# Patient Record
Sex: Male | Born: 1945 | Race: White | Hispanic: No | Marital: Married | Smoking: Former smoker
Health system: Southern US, Community
[De-identification: ages and names within clinical notes are randomized; demographics above are authoritative.]

## PROBLEM LIST (undated history)

## (undated) DIAGNOSIS — M1990 Unspecified osteoarthritis, unspecified site: Secondary | ICD-10-CM

## (undated) DIAGNOSIS — K219 Gastro-esophageal reflux disease without esophagitis: Secondary | ICD-10-CM

## (undated) DIAGNOSIS — Z86010 Personal history of colonic polyps: Secondary | ICD-10-CM

## (undated) DIAGNOSIS — Z8711 Personal history of peptic ulcer disease: Secondary | ICD-10-CM

## (undated) DIAGNOSIS — E785 Hyperlipidemia, unspecified: Secondary | ICD-10-CM

## (undated) DIAGNOSIS — I10 Essential (primary) hypertension: Secondary | ICD-10-CM

## (undated) DIAGNOSIS — Z85828 Personal history of other malignant neoplasm of skin: Secondary | ICD-10-CM

## (undated) DIAGNOSIS — C801 Malignant (primary) neoplasm, unspecified: Secondary | ICD-10-CM

## (undated) DIAGNOSIS — IMO0002 Reserved for concepts with insufficient information to code with codable children: Secondary | ICD-10-CM

## (undated) HISTORY — DX: Gastro-esophageal reflux disease without esophagitis: K21.9

## (undated) HISTORY — PX: OTHER SURGICAL HISTORY: SHX169

## (undated) HISTORY — PX: EYE SURGERY: SHX253

## (undated) HISTORY — DX: Hyperlipidemia, unspecified: E78.5

## (undated) HISTORY — DX: Personal history of peptic ulcer disease: Z87.11

## (undated) HISTORY — DX: Reserved for concepts with insufficient information to code with codable children: IMO0002

## (undated) HISTORY — DX: Personal history of other malignant neoplasm of skin: Z85.828

## (undated) HISTORY — DX: Personal history of colonic polyps: Z86.010

---

## 1968-03-14 HISTORY — PX: INGUINAL HERNIA REPAIR: SUR1180

## 2006-03-27 ENCOUNTER — Ambulatory Visit: Payer: Self-pay | Admitting: Internal Medicine

## 2006-03-29 ENCOUNTER — Ambulatory Visit: Payer: Self-pay | Admitting: Internal Medicine

## 2006-03-29 LAB — CONVERTED CEMR LAB
AST: 14 units/L (ref 0–37)
BUN: 8 mg/dL (ref 6–23)
Calcium: 10.3 mg/dL (ref 8.4–10.5)
Chloride: 104 meq/L (ref 96–112)
Cholesterol: 206 mg/dL (ref 0–200)
Creatinine, Ser: 1 mg/dL (ref 0.4–1.5)
Eosinophils Relative: 1.6 % (ref 0.0–5.0)
HCT: 52 % (ref 39.0–52.0)
HDL: 23 mg/dL — ABNORMAL LOW (ref >39.0)
Hemoglobin: 16.8 g/dL (ref 13.0–17.0)
Lymphocytes Relative: 23.2 % (ref 12.0–46.0)
MCHC: 32.4 g/dL (ref 30.0–36.0)
MCV: 94.5 fL (ref 78.0–100.0)
Neutro Abs: 6.9 10*3/uL (ref 1.4–7.7)
Neutrophils Relative %: 67 % (ref 43.0–77.0)
PSA: 0.5 ng/mL (ref 0.10–4.00)
RDW: 12.4 % (ref 11.5–14.6)
Total Bilirubin: 1 mg/dL (ref 0.3–1.2)

## 2006-04-05 ENCOUNTER — Encounter: Admission: RE | Admit: 2006-04-05 | Discharge: 2006-04-05 | Payer: Self-pay | Admitting: Internal Medicine

## 2006-04-19 ENCOUNTER — Ambulatory Visit: Payer: Self-pay | Admitting: Cardiology

## 2006-12-28 ENCOUNTER — Ambulatory Visit: Payer: Self-pay | Admitting: Internal Medicine

## 2006-12-28 ENCOUNTER — Inpatient Hospital Stay (HOSPITAL_COMMUNITY): Admission: EM | Admit: 2006-12-28 | Discharge: 2006-12-30 | Payer: Self-pay | Admitting: Emergency Medicine

## 2006-12-29 ENCOUNTER — Encounter (INDEPENDENT_AMBULATORY_CARE_PROVIDER_SITE_OTHER): Payer: Self-pay | Admitting: Gastroenterology

## 2006-12-29 ENCOUNTER — Encounter: Payer: Self-pay | Admitting: Internal Medicine

## 2007-01-18 ENCOUNTER — Ambulatory Visit: Payer: Self-pay | Admitting: Internal Medicine

## 2007-01-18 DIAGNOSIS — R930 Abnormal findings on diagnostic imaging of skull and head, not elsewhere classified: Secondary | ICD-10-CM | POA: Insufficient documentation

## 2007-01-18 DIAGNOSIS — D649 Anemia, unspecified: Secondary | ICD-10-CM

## 2007-01-19 LAB — CONVERTED CEMR LAB
Basophils Absolute: 0.1 10*3/uL (ref 0.0–0.1)
Basophils Relative: 0.6 % (ref 0.0–1.0)
HCT: 36.3 % — ABNORMAL LOW (ref 39.0–52.0)
Hemoglobin: 12.6 g/dL — ABNORMAL LOW (ref 13.0–17.0)
MCHC: 34.7 g/dL (ref 30.0–36.0)
Monocytes Absolute: 0.3 10*3/uL (ref 0.2–0.7)
Neutrophils Relative %: 68.9 % (ref 43.0–77.0)
RDW: 13.5 % (ref 11.5–14.6)

## 2007-01-22 ENCOUNTER — Encounter (INDEPENDENT_AMBULATORY_CARE_PROVIDER_SITE_OTHER): Payer: Self-pay | Admitting: *Deleted

## 2007-01-24 ENCOUNTER — Ambulatory Visit: Payer: Self-pay | Admitting: Internal Medicine

## 2007-01-26 ENCOUNTER — Encounter (INDEPENDENT_AMBULATORY_CARE_PROVIDER_SITE_OTHER): Payer: Self-pay | Admitting: *Deleted

## 2007-01-26 ENCOUNTER — Encounter: Payer: Self-pay | Admitting: Internal Medicine

## 2007-02-28 ENCOUNTER — Encounter: Payer: Self-pay | Admitting: Internal Medicine

## 2007-03-05 ENCOUNTER — Telehealth (INDEPENDENT_AMBULATORY_CARE_PROVIDER_SITE_OTHER): Payer: Self-pay | Admitting: *Deleted

## 2007-03-14 ENCOUNTER — Ambulatory Visit (HOSPITAL_COMMUNITY): Admission: RE | Admit: 2007-03-14 | Discharge: 2007-03-14 | Payer: Self-pay | Admitting: Gastroenterology

## 2007-03-14 ENCOUNTER — Encounter (INDEPENDENT_AMBULATORY_CARE_PROVIDER_SITE_OTHER): Payer: Self-pay | Admitting: Gastroenterology

## 2007-03-14 ENCOUNTER — Encounter: Payer: Self-pay | Admitting: Internal Medicine

## 2007-09-13 ENCOUNTER — Ambulatory Visit: Payer: Self-pay | Admitting: Internal Medicine

## 2007-09-13 DIAGNOSIS — Z8711 Personal history of peptic ulcer disease: Secondary | ICD-10-CM

## 2007-09-13 DIAGNOSIS — Z860100 Personal history of colon polyps, unspecified: Secondary | ICD-10-CM | POA: Insufficient documentation

## 2007-09-13 DIAGNOSIS — E785 Hyperlipidemia, unspecified: Secondary | ICD-10-CM

## 2007-09-13 HISTORY — DX: Personal history of peptic ulcer disease: Z87.11

## 2007-09-13 HISTORY — DX: Personal history of colon polyps, unspecified: Z86.0100

## 2007-09-13 HISTORY — DX: Personal history of colonic polyps: Z86.010

## 2007-09-17 ENCOUNTER — Encounter (INDEPENDENT_AMBULATORY_CARE_PROVIDER_SITE_OTHER): Payer: Self-pay | Admitting: *Deleted

## 2007-09-17 LAB — CONVERTED CEMR LAB
BUN: 8 mg/dL (ref 6–23)
Bilirubin, Direct: 0.1 mg/dL (ref 0.0–0.3)
Eosinophils Absolute: 0.3 10*3/uL (ref 0.0–0.7)
HCT: 46.7 % (ref 39.0–52.0)
MCHC: 34.6 g/dL (ref 30.0–36.0)
MCV: 91.1 fL (ref 78.0–100.0)
Monocytes Absolute: 0.5 10*3/uL (ref 0.1–1.0)
PSA: 0.53 ng/mL (ref 0.10–4.00)
Platelets: 243 10*3/uL (ref 150–400)
Potassium: 4.4 meq/L (ref 3.5–5.1)
RDW: 12.3 % (ref 11.5–14.6)
Total Bilirubin: 0.8 mg/dL (ref 0.3–1.2)

## 2007-09-18 ENCOUNTER — Encounter: Payer: Self-pay | Admitting: Internal Medicine

## 2007-10-12 ENCOUNTER — Telehealth (INDEPENDENT_AMBULATORY_CARE_PROVIDER_SITE_OTHER): Payer: Self-pay | Admitting: *Deleted

## 2007-10-29 ENCOUNTER — Telehealth (INDEPENDENT_AMBULATORY_CARE_PROVIDER_SITE_OTHER): Payer: Self-pay | Admitting: *Deleted

## 2007-12-18 ENCOUNTER — Ambulatory Visit: Payer: Self-pay | Admitting: Internal Medicine

## 2007-12-25 ENCOUNTER — Encounter (INDEPENDENT_AMBULATORY_CARE_PROVIDER_SITE_OTHER): Payer: Self-pay | Admitting: *Deleted

## 2007-12-25 ENCOUNTER — Ambulatory Visit: Payer: Self-pay | Admitting: Internal Medicine

## 2007-12-25 LAB — CONVERTED CEMR LAB
ALT: 11 units/L (ref 0–53)
Alkaline Phosphatase: 81 units/L (ref 39–117)
Bilirubin, Direct: 0.1 mg/dL (ref 0.0–0.3)
Cholesterol: 141 mg/dL (ref 0–200)
LDL Cholesterol: 94 mg/dL (ref 0–99)
Total Bilirubin: 0.6 mg/dL (ref 0.3–1.2)
Total Protein: 7.3 g/dL (ref 6.0–8.3)

## 2009-09-01 ENCOUNTER — Ambulatory Visit: Payer: Self-pay | Admitting: Internal Medicine

## 2009-09-01 DIAGNOSIS — R7309 Other abnormal glucose: Secondary | ICD-10-CM

## 2009-09-01 DIAGNOSIS — I4510 Unspecified right bundle-branch block: Secondary | ICD-10-CM | POA: Insufficient documentation

## 2009-09-01 DIAGNOSIS — Z85828 Personal history of other malignant neoplasm of skin: Secondary | ICD-10-CM | POA: Insufficient documentation

## 2009-09-01 HISTORY — DX: Personal history of other malignant neoplasm of skin: Z85.828

## 2009-09-01 LAB — CONVERTED CEMR LAB: HDL goal, serum: 40 mg/dL

## 2009-09-07 LAB — CONVERTED CEMR LAB
ALT: 14 units/L (ref 0–53)
AST: 16 units/L (ref 0–37)
Alkaline Phosphatase: 78 units/L (ref 39–117)
BUN: 13 mg/dL (ref 6–23)
Basophils Absolute: 0.1 10*3/uL (ref 0.0–0.1)
Bilirubin, Direct: 0.1 mg/dL (ref 0.0–0.3)
Calcium: 10.5 mg/dL (ref 8.4–10.5)
Eosinophils Relative: 2.1 % (ref 0.0–5.0)
GFR calc non Af Amer: 84.45 mL/min (ref >60)
HCT: 43.8 % (ref 39.0–52.0)
HDL: 27.7 mg/dL — ABNORMAL LOW (ref >39.00)
Lymphocytes Relative: 23.2 % (ref 12.0–46.0)
Lymphs Abs: 2.5 10*3/uL (ref 0.7–4.0)
Monocytes Relative: 5.2 % (ref 3.0–12.0)
Neutrophils Relative %: 68.8 % (ref 43.0–77.0)
Platelets: 280 10*3/uL (ref 150.0–400.0)
Potassium: 4.6 meq/L (ref 3.5–5.1)
RDW: 12.6 % (ref 11.5–14.6)
Total Bilirubin: 0.7 mg/dL (ref 0.3–1.2)
Triglycerides: 277 mg/dL — ABNORMAL HIGH (ref 0.0–149.0)
VLDL: 55.4 mg/dL — ABNORMAL HIGH (ref 0.0–40.0)
WBC: 10.7 10*3/uL — ABNORMAL HIGH (ref 4.5–10.5)

## 2009-12-29 ENCOUNTER — Ambulatory Visit: Payer: Self-pay | Admitting: Internal Medicine

## 2009-12-30 LAB — CONVERTED CEMR LAB
Cholesterol: 193 mg/dL (ref 0–200)
HDL: 27 mg/dL — ABNORMAL LOW (ref >39.00)
VLDL: 35.8 mg/dL (ref 0.0–40.0)

## 2010-04-13 NOTE — Assessment & Plan Note (Signed)
Summary: cpx//lch   Vital Signs:  Patient profile:   65 year old male Height:      72 inches Weight:      200 pounds BMI:     27.22 BSA:     2.13 Pulse rate:   72 / minute Pulse rhythm:   regular Resp:     15 per minute BP sitting:   136 / 84  (left arm) Cuff size:   large  Vitals Entered By: Ok Anis CMA (September 01, 2009 11:11 AM)   CC:  CPX .  History of Present Illness: Mr. Flater is here for a physical; he is asymptomatic .   Lipid Management History:      Positive NCEP/ATP III risk factors include male age 63 years old or older and HDL cholesterol less than 40.  Negative NCEP/ATP III risk factors include non-diabetic, no family history for ischemic heart disease, non-tobacco-user status, non-hypertensive, no ASHD (atherosclerotic heart disease), no prior stroke/TIA, no peripheral vascular disease, and no history of aortic aneurysm.     Preventive Screening-Counseling & Management  Alcohol-Tobacco     Smoking Status: quit  Caffeine-Diet-Exercise     Does Patient Exercise: no  Current Medications (verified): 1)  Omeprazole 20 Mg  Tbec (Omeprazole) .Marland Kitchen.. 1 By Mouth Bid 2)  Multivitamins  Tabs (Multiple Vitamin) .... One Tablet By Mouth Once Daily  Allergies (verified): 1)  ! Trilipix (Choline Fenofibrate)  Past History:  Past Medical History: DUD, bleeding  1992 & 2008 Colonic polyps, 2008 Hyperlipidemia: Framingham Study LDL goal = < 130 Hyperglycemia Skin cancer, hx of 2010, Dr Karlyn Agee  Past Surgical History: ABNORMAL CHEST XRAY (ICD-793.1) ANEMIA (ICD-285.9), PMH of Cauterization in 1992,resolution with meds  2008 Eye surgery post trauma, glass fragments removed & lacerations repaired  Colonoscopy 02/2007 :   polypectomy (multiple), Dr Vida Rigger Inguinal herniorrhaphy Mohs surgery R ear 2011; Pilonidal Cystectomy 1969  Family History: Father: 76 of old age @ 69 Mother: DECEASED, CVA,CAD,pacer,HTN,CHF Siblings: 1 BROTHER:DM,HTN  Social  History: No diet Occupation: Unemployed Married Former Smoker: 08/2008 Alcohol use-no Regular exercise-no Smoking Status:  quit Does Patient Exercise:  no  Review of Systems  The patient denies anorexia, fever, weight loss, weight gain, decreased hearing, hoarseness, chest pain, syncope, dyspnea on exertion, peripheral edema, prolonged cough, headaches, hemoptysis, abdominal pain, melena, hematochezia, severe indigestion/heartburn, hematuria, suspicious skin lesions, depression, unusual weight change, abnormal bleeding, enlarged lymph nodes, and angioedema.         Dr Ewing Schlein recommended colonoscopy last year; job loss caused him to defer it.  Physical Exam  General:  well-nourished,in no acute distress; alert,appropriate and cooperative throughout examination Head:  Normocephalic and atraumatic without obvious abnormalities. No apparent alopecia  Eyes:  No corneal or conjunctival inflammation noted. Ditorted OD pupil(prior trauma) Funduscopic exam benign, without hemorrhages, exudates or papilledema.  Ears:  External ear exam shows no significant lesions or deformities.  Otoscopic examination reveals clear canals, tympanic membranes are intact bilaterally without bulging, retraction, inflammation or discharge. Hearing is grossly normal bilaterally. Nose:  External nasal examination shows no deformity or inflammation. Nasal mucosa are pink and moist without lesions or exudates. Mouth:  Oral mucosa and oropharynx without lesions or exudates.  Crown missing L maxillary molar Neck:  No deformities, masses, or tenderness noted. Lungs:  Normal respiratory effort, chest expands symmetrically. Lungs are clear to auscultation, no crackles or wheezes. BS decreased  Heart:  Normal rate and regular rhythm. S1 and S2 normal without gallop, murmur, click,  rub . S4 Abdomen:  Bowel sounds positive,abdomen soft and non-tender without masses, organomegaly or hernias noted. Rectal:  No external abnormalities  noted. Normal sphincter tone. No rectal masses or tenderness. Op scar supragluteal area Genitalia:  Testes bilaterally descended without nodularity, tenderness or masses. No scrotal masses or lesions. No penis lesions or urethral discharge. Prostate:  Prostate gland firm and smooth, no enlargement, nodularity, tenderness, mass, asymmetry or induration. Msk:  No deformity or scoliosis noted of thoracic or lumbar spine.   Pulses:  R and L carotid,radial,dorsalis pedis and posterior tibial pulses are full and equal bilaterally Extremities:  No clubbing, cyanosis, edema, or deformity noted with normal full range of motion of all joints.   Neurologic:  alert & oriented X3, gait normal, and DTRs symmetrical and normal.   Skin:  Intact without suspicious lesions or rashes Cervical Nodes:  No lymphadenopathy noted Axillary Nodes:  No palpable lymphadenopathy Inguinal Nodes:  No significant adenopathy Psych:  memory intact for recent and remote, normally interactive, good eye contact, not anxious appearing, and not depressed appearing.     Impression & Recommendations:  Problem # 1:  ROUTINE GENERAL MEDICAL EXAM@HEALTH  CARE FACL (ICD-V70.0)  Orders: EKG w/ Interpretation (93000) Venipuncture ([address]) TLB-Lipid Panel (80061-LIPID) TLB-BMP (Basic Metabolic Panel-BMET) (80048-METABOL) TLB-CBC Platelet - w/Differential (85025-CBCD) TLB-Hepatic/Liver Function Pnl (80076-HEPATIC) TLB-TSH (Thyroid Stimulating Hormone) (84443-TSH) TLB-PSA (Prostate Specific Antigen) (84153-PSA) T-2 View CXR (71020TC)  Problem # 2:  HYPERLIPIDEMIA (ICD-272.4)  Orders: EKG w/ Interpretation (93000) Venipuncture (60454) TLB-Lipid Panel (80061-LIPID)  Problem # 3:  COLONIC POLYPS, HX OF (ICD-V12.72) as per Dr Ewing Schlein  Problem # 4:  DUODENAL ULCER, HX OF (ICD-V12.71)  His updated medication list for this problem includes:    Omeprazole 20 Mg Tbec (Omeprazole) .Marland Kitchen... 1 once daily  Problem # 5:  SKIN CANCER, HX OF  (ICD-V10.83) as per Dr Yetta Barre  Problem # 6:  ABNORMAL CHEST XRAY (ICD-793.1)  PMH of  Orders: T-2 View CXR (71020TC)  Complete Medication List: 1)  Omeprazole 20 Mg Tbec (Omeprazole) .Marland Kitchen.. 1 once daily 2)  Multivitamins Tabs (Multiple vitamin) .... One tablet by mouth once daily  Other Orders: Tdap => 55yrs IM (09811) Admin 1st Vaccine (91478)  Lipid Assessment/Plan:      Based on NCEP/ATP III, the patient's risk factor category is "2 or more risk factors and a calculated 10 year CAD risk of < 20%".  The patient's lipid goals are as follows: Total cholesterol goal is 200; LDL cholesterol goal is 130; HDL cholesterol goal is 40; Triglyceride goal is 150.  His LDL cholesterol goal has been met.    Patient Instructions: 1)  Avoid foods high in acid (tomatoes, citrus juices, spicy foods). Avoid eating within two hours of lying down or before exercising. Do not over eat; try smaller more frequent meals. Elevate head of bed twelve inches when sleeping.It is important that you exercise regularly at least 20 minutes 5 times a week. If you develop chest pain, have severe difficulty breathing, or feel very tired , stop exercising immediately and seek medical attention. Prescriptions: OMEPRAZOLE 20 MG  TBEC (OMEPRAZOLE) 1 once daily  #90 x 3   Entered and Authorized by:   Marga Melnick MD   Signed by:   Marga Melnick MD on 09/01/2009   Method used:   Faxed to ...       [facility] [phone]* (retail)       [address]       [place], [place]  [address]       Ph: [phone] or [phone]       Fax: [phone]   RxID:   [id number]    Immunizations Administered:  Tetanus Vaccine:    Vaccine Type: Tdap    Site: right deltoid    Mfr: GlaxoSmithKline    Dose: 0.5 ml    Route: IM    Given by: Ok Anis CMA    Exp. Date: 06/06/2011    Lot #: [id number]    VIS given: 01/30/07 version given September 01, 2009.    Orders Added: 1)  Tdap => 78yrs IM [90715] 2)  Admin 1st Vaccine  [90471] 3)  Est. Patient 40-64 years [99396] 4)  EKG w/ Interpretation [93000] 5)  Venipuncture [36415] 6)  TLB-Lipid Panel [80061-LIPID] 7)  TLB-BMP (Basic Metabolic Panel-BMET) [80048-METABOL] 8)  TLB-CBC Platelet - w/Differential [85025-CBCD] 9)  TLB-Hepatic/Liver Function Pnl [80076-HEPATIC] 10)  TLB-TSH (Thyroid Stimulating Hormone) [84443-TSH] 11)  TLB-PSA (Prostate Specific Antigen) [34742-VZD] 12)  T-2 View CXR [71020TC]

## 2010-04-16 ENCOUNTER — Encounter: Payer: Self-pay | Admitting: Internal Medicine

## 2010-04-16 ENCOUNTER — Ambulatory Visit (INDEPENDENT_AMBULATORY_CARE_PROVIDER_SITE_OTHER): Payer: BC Managed Care – PPO | Admitting: Internal Medicine

## 2010-04-16 DIAGNOSIS — K119 Disease of salivary gland, unspecified: Secondary | ICD-10-CM

## 2010-04-21 NOTE — Assessment & Plan Note (Signed)
Summary: knot by ear/cbs   Vital Signs:  Patient profile:   65 year old male Weight:      183.8 pounds BMI:     25.02 Temp:     97.8 degrees F oral Pulse rate:   72 / minute Resp:     14 per minute BP sitting:   130 / 86  (left arm) Cuff size:   large  Vitals Entered By: Shonna Chock CMA (April 16, 2010 4:28 PM) CC: Knot on the left side of neck, no pain (1st occurance), URI symptoms   CC:  Knot on the left side of neck, no pain (1st occurance), and URI symptoms.  History of Present Illness:   Painless "knot"  @ L mandibular angle today after eating chocolate. The patient denies nasal congestion, purulent nasal discharge, sore throat, productive cough, and earache.  The patient denies fever, dyspnea, and wheezing.  The patient denies headache.  The patient denies the following risk factors for Strep sinusitis: unilateral facial pain, tooth pain, and tender adenopathy.    Current Medications (verified): 1)  Omeprazole 20 Mg  Tbec (Omeprazole) .Marland Kitchen.. 1 Once Daily 2)  Multivitamins  Tabs (Multiple Vitamin) .... One Tablet By Mouth Once Daily  Allergies: 1)  ! Trilipix (Choline Fenofibrate)  Physical Exam  General:  well-nourished,in no acute distress; alert,appropriate and cooperative throughout examination Ears:  External ear exam shows no significant lesions or deformities.  Otoscopic examination reveals clear canals, tympanic membranes are intact bilaterally without bulging, retraction, inflammation or discharge. Hearing is grossly normal bilaterally. Nose:  External nasal examination shows no deformity or inflammation. Nasal mucosa are pink and moist without lesions or exudates. Mouth:  Oral mucosa and oropharynx without lesions or exudates.  Teeth in good repair. Mild swelling L parotid gland , non tender Abdomen:  Bowel sounds positive,abdomen soft and non-tender without masses, organomegaly or hernias noted. Skin:  Intact without suspicious lesions or rashes Cervical Nodes:   No lymphadenopathy noted Axillary Nodes:  No palpable lymphadenopathy   Impression & Recommendations:  Problem # 1:  UNSPECIFIED DISEASE OF THE SALIVARY GLANDS (ICD-527.9) L Parotid gland  swelling, resolving  Complete Medication List: 1)  Omeprazole 20 Mg Tbec (Omeprazole) .Marland Kitchen.. 1 once daily 2)  Multivitamins Tabs (Multiple vitamin) .... One tablet by mouth once daily  Patient Instructions: 1)  Warm  compresses 3-4X  / day & soft diet . Avoid sour candy   or other  food triggers as discussed.  ENT referral for persistent swelling or recurrence   Orders Added: 1)  Est. Patient Level III [[address]]

## 2010-07-27 NOTE — Op Note (Signed)
[name], [name]            ACCOUNT NO.:  [id number]   MEDICAL RECORD NO.:  [id number]          PATIENT TYPE:  INP   LOCATION:  3738                         FACILITY:  [facility]   PHYSICIAN:  Petra Kuba, M.D.    DATE OF BIRTH:  1945-09-17   DATE OF PROCEDURE:  12/29/2006  DATE OF DISCHARGE:                               OPERATIVE REPORT   PROCEDURE:  EGD with biopsy.   INDICATION:  A patient with hematemesis, melena, history of ulcers in  the past.   CONSENT:  Was signed after risks, benefits, methods, options thoroughly  discussed prior to any sedation.   MEDICINES USED:  Fentanyl 50 mcg, Versed 6 mg.   PROCEDURE IN DETAIL:  The video endoscope was inserted by direct vision.  He did have a small hiatal hernia with acute GE junction erosion.  The  scope passed into the stomach.  No blood was seen.  Advanced through a  normal antrum and pylorus into the duodenal bulb which was deformed  right at the C-loop were edematous folds and a moderate-sized ulcer with  a flat white base.  The ulcer was washed and watched but could not be  made to bleed.  We went ahead and advanced into the second and probably  third part of the duodenum.  No blood was seen distally.  He possibly  had some scalloping of the duodenal mucosa and a few biopsies were  obtained and put in the first container.  Scope was withdrawn back to  the ulcer.  Again it was rewashed and watched and it could not be made  to bleed.  Quick look again at the bulb confirmed its deformity.  No  other lesions were seen.  Scope was withdrawn back to the stomach and  retroflexed.  Cardia, fundus, angularis, lesser and greater curve were  evaluated on retroflex and then straight visualization without  additional findings.  We went ahead and took two biopsies of the antrum  and two of the proximal stomach to rule out Helicobacter and put them in  a separate container.  Air was suctioned, scope slowly withdrawn.  Again  a good  look at the GE junction confirmed the two small erosion, small  hiatal hernia but ruled out any additional lesions.  The rest of the  esophagus was normal.  Scope was removed.  The patient tolerated the  procedure well.  There was no obvious immediate complication.   ENDOSCOPIC DIAGNOSES:  1. Small hiatal hernia with two GE junction erosions.  2. Distal bulb beginning in the C-loop ulcer without visible vessel      signs of bleeding, clots, etc., could not be made to bleed with      washing.  3. Questionable scalloping in the third portion of the duodenum status      post biopsy.  4. Otherwise normal EGD without any blood being seen, status post      biopsy of the stomach to rule out Helicobacter.   PLAN:  B.i.d. pump inhibitors for 2 weeks and then decrease them to once  a day.  I would be happy  to see back in a month or two to recheck CBC,  treat Helicobacter if positive and then probably schedule him for a  screening  colonoscopy which he is due for.  In the meantime can advance diet and  hopefully go home soon if no rebleeding and no aspirin or nonsteroidals  long-term.  Will ask him to call me for the biopsy results in 1 week and  set up colonoscopy at that juncture and sooner p.r.n.           ______________________________  Petra Kuba, M.D.     MEM/MEDQ  D:  12/29/2006  T:  12/31/2006  Job:  [id number]   cc:   Vikki Ports A. Felicity Coyer, MD  Titus Dubin. Alwyn Ren, MD,FACP,FCCP  Tasia Catchings, M.D.

## 2010-07-27 NOTE — Consult Note (Signed)
NAMESTACE, PEACE NO.:  [id number]   MEDICAL RECORD NO.:  [id number]          PATIENT TYPE:  INP   LOCATION:  3738                         FACILITY:  [facility]   PHYSICIAN:  Petra Kuba, M.D.    DATE OF BIRTH:  Aug 31, 1945   DATE OF CONSULTATION:  DATE OF DISCHARGE:                                 CONSULTATION   REASON FOR CONSULTATION:  We were asked to see Mr. Mcclaine today by Dr.  Rene Paci of [facility] in consultation for  hematemesis.   HISTORY OF PRESENT ILLNESS:  This is a 65 year old male with a history  of ulcer disease in the late 90s complaining of nausea, but no other  upper tract GI symptoms until October 16 when he started experiencing  melena and dark hematemesis.  He came to the emergency room.  His  hemoglobin in the emergency room was 10.5.  By the time he reached the  fifth floor, his hemoglobin had dropped to 7.8.  He denies any alcohol  use.  He is a smoker, takes an occasional NSAID, but does not sound like  he uses them excessively.  He is not on any PPIs at home.  He takes no  medications.  He reports that he last vomited yesterday evening.  He has  had one bloody black bowel movement this morning.   PAST MEDICAL HISTORY:  Significant for only ulcer disease in  approximately 1998.  He denies any hypertension, diabetes or other  negative Health history.  He reports no surgeries.  He has never had a  colonoscopy.  His gastroenterologist is Dr. Sherin Quarry.  His primary care  physician is Dr. Alwyn Ren of .   REVIEW OF SYSTEMS:  Noncontributory.   SOCIAL HISTORY:  Positive for tobacco use, negative for alcohol and  drugs.  He is a Teacher, English as a foreign language.   FAMILY HISTORY:  Negative for ulcer disease.   PHYSICAL EXAMINATION:  GENERAL:  He is alert and oriented.  VITAL SIGNS:  Temperature 98.2, respirations 18, pulse 85, blood  pressure this morning was 104/63.  HEART:  Regular rate and rhythm with no murmurs, rubs or gallops.  ABDOMEN:  Soft, nontender, nondistended with good bowel sounds.   LABORATORY DATA:  Hemoglobin at 1:00 a.m. this morning was 7.8.  He has  had 2 units of packed red blood cells transfused since then, hematocrit  22.9, white count elevated at 14.6, platelets are 53,000.  Chem-7 shows  a BUN of 22, creatinine of 0.82, potassium is normal.  Coags and LFTs  were normal.  He is guaiac positive in the ER.   ASSESSMENT:  Dr. Vida Rigger has seen and examined the patient, collected  a history and reviewed his chart.  His impression is that this is a 65-  year-old male with history of ulcer disease and accompanying GI bleed  who has had a recurrence.  Will plan for an upper endoscopy this  morning.      Stephani Police, PA    ______________________________  Petra Kuba, M.D.    MLY/MEDQ  D:  12/29/2006  T:  12/31/2006  Job:  [id number]   cc:   Tasia Catchings, M.D.  Petra Kuba, M.D.  Titus Dubin. Alwyn Ren, MD,FACP,FCCP

## 2010-07-27 NOTE — H&P (Signed)
[name], [name]            ACCOUNT NO.:  [id number]   MEDICAL RECORD NO.:  [id number]          PATIENT TYPE:  EMS   LOCATION:  MAJO                         FACILITY:  [facility]   PHYSICIAN:  Hollice Espy, M.D.DATE OF BIRTH:  1945/08/11   DATE OF ADMISSION:  12/28/2006  DATE OF DISCHARGE:                              HISTORY & PHYSICAL   ATTENDING:  Vikki Ports A. Felicity Coyer, MD   PRIMARY CARE PHYSICIAN:  Titus Dubin. Alwyn Ren, MD, FACP, FCCP   CONSULTATIONS:  [facility].   CHIEF COMPLAINT:  Hematemesis and melena.   HISTORY OF PRESENT ILLNESS:  The patient is a 65 year old white male  with past medical history of tobacco abuse and a previous history of  gastric ulcer back in 1995.  Since that time, he has been off NSAIDs,  however, he admits to taking occasional Advil which he thought would be  okay, although he does not take this on a regular basis.  He has been in  good healthy up until today when he noticed he started having some  abdominal pain, generalized, although mostly upper quadrant and al of a  sudden had 2 episodes of nausea and vomiting.  The second one looked to  have some blood in it.  Then he started having 2 bowel movements, all of  them showed black tarry stool.  He felt extremely lightheaded and came  into the emergency room for further evaluation.  In the emergency room  he was noted to have a heart rate of 83 and a blood pressure 103/69,  however, this was lying down.  When he was stood up his blood pressure  dropped down 79/46.  Labs ordered on the patient.  He was found to have  a white count of 16.1 with an 89% shift.  His hemoglobin was noted to  10.3.  His BUN was slightly elevated at 32 but his creatinine was  normal.  Coags were unremarkable on this patient including his liver  function tests.  His albumin was slightly low at 3.2.  The patient was  started on IV fluids and started feeling better.  He was typed and  crossed.  Currently he is  doing okay.  He denies any headaches, visual  changes, dysphagia, chest pain, palpitations, shortness of breath,  wheezing, coughing, no abdominal pain, no hematuria, dysuria or  constipation. No focal extremity numbness, weakness or pain.   REVIEW OF SYSTEMS:  Otherwise negative.   PAST MEDICAL HISTORY:  Tobacco abuse and a previous history of gastric  bleeding ulcer back in 1995.   MEDICATIONS:  None.  Although he takes p.r.n. Advil.   ALLERGIES:  NONE, ALTHOUGH NSAIDS ARE NOW CONTRAINDICATED.   SOCIAL HISTORY:  He admits to smoking less than a pack a day.  He denies  any alcohol or drug use.   FAMILY HISTORY:  Noncontributory.   PHYSICAL EXAMINATION:  VITAL SIGNS:  On admission temp 97.4, heart rate  83, blood pressure 103/69, when standing up drops down to 79/46,  respirations 20, O2 saturation 96% on room air.  GENERAL:  He is alert and oriented times 3 in  no apparent distress.  HEENT:  Normocephalic, atraumatic.  His mucous membranes are dry.  He  has no carotid bruits.  HEART:  Regular rate and rhythm.  S1 and S2.  LUNGS:  Clear to auscultation bilaterally.  ABDOMEN:  Soft, obese, nontender.  Positive bowel sounds.  EXTREMITIES:  Show no clubbing, cyanosis or edema.  Good 2+ pulses.   LABORATORY DATA:  White count 16.1, H&H 10.3 and 30.3, MCV 93, platelet  count 289, 89% shift.  Hemoccult positive.  LFTs unremarkable except for  an albumin 3.2, total protein 5.8, coags unremarkable.  UA notes a small  leukocyte esterase but only 0 to 2 white cells and some oxalate  crystals.  Sodium 142, potassium 4.3, chloride 108, bicarb 23, BUN 32,  creatinine 0.8, glucose 138.   ASSESSMENT/PLAN:  1. Suspected upper gastrointestinal bleed.  We will make the patient      n.p.o., put him on IV Protonix, check an hemoglobin and hematocrit      q.8h. [facility] has been notified.  They will follow      up with the patient in the morning.  If his hemoglobin falls below       8, we will transfuse. In the meantime continue IV fluids to keep      his blood pressure up.  2. Leukocytosis likely stress margination secondary to his acute      gastrointestinal bleed.  However, if he continues to have an      elevated white count, would consider the possibility of mild      aspiration pneumonia when he had his first episode of vomiting.  3. Tobacco abuse.  The patient has accepted a nicotine patch.      Hollice Espy, M.D.  Electronically Signed     SKK/MEDQ  D:  12/28/2006  T:  12/29/2006  Job:  [id number]   cc:   Marjean Donna. Alwyn Ren, MD,FACP,FCCP

## 2010-07-27 NOTE — Op Note (Signed)
NAMEGRISELDA, [name]            ACCOUNT NO.:  [id number]   MEDICAL RECORD NO.:  [id number]          PATIENT TYPE:  AMB   LOCATION:  ENDO                         FACILITY:  [facility]   PHYSICIAN:  Petra Kuba, M.D.    DATE OF BIRTH:  1945-12-15   DATE OF PROCEDURE:  03/14/2007  DATE OF DISCHARGE:                               OPERATIVE REPORT   PROCEDURE:  EGD with biopsy.   INDICATIONS:  Patient with history of peptic ulcer disease.  Want to  repeat and document healing.  Consent was signed after risks, benefits,  methods, and options were thoroughly discussed multiple times in the  past.   MEDICINES USED PER ANESTHESIA:  Combination of Diprivan, fentanyl, and  Versed.   PROCEDURE:  The video endoscope was inserted by direct vision.  The  esophagus was normal.  He did have a small hiatal hernia, with a widely  patent ring.  Scope passed into the stomach, advanced through a normal  antrum, normal pylorus, into the duodenal bulb.  There was some spasm at  the distal duodenal bulb around the C-loop, but with air insufflation it  looked like he had one deep diverticulum, probably scarring from the  previous ulcer, and maybe even a second tiny diverticulum.  No frank  ulceration was seen.  Scope was advanced around the C-loop to a normal  second portion of the duodenum.  The scope was slowly withdrawn back to  the bulb, and this area was watched and watched and readvanced back and  forth a few times, which confirmed the above findings.  Scope was  withdrawn back to the stomach and retroflexed.  High in the cardia, the  hiatal hernia was confirmed.  The angularis, greater and lesser curve,  and fundus were all normal on retroflexed visualization.  Straight  visualization of the stomach did not reveal any additional findings.  We  went ahead and took two biopsies of the antrum and two of the proximal  stomach to rule out Helicobacter.  Air was suctioned.  Scope was slowly  withdrawn.   Again, a good look at the esophagus was normal.  The scope  was removed.  The patient tolerated the procedure well.  There was no  obvious immediate complication.   ENDOSCOPIC DIAGNOSES:  1. Small hiatal hernia, with a widely patent thin fibrous ring.  2. Normal stomach, status post biopsy, rule out Helicobacter.  3. Distal bulb one and possibly a second diverticulum.  4. No active ulcer seen.  5. Otherwise within normal limits esophagogastroduodenoscopy.   PLAN:  Continue workup with a colonoscopy.  Probably continue long-term  pump inhibitors.  Care with aspirin and nonsteroidals.  Treat  Helicobacter pylori if positive, but was negative on mass check           ______________________________  Petra Kuba, M.D.     MEM/MEDQ  D:  03/14/2007  T:  03/14/2007  Job:  [id number]   cc:   Titus Dubin. Alwyn Ren, MD,FACP,FCCP  [phone] [place]  [place] [id number]

## 2010-07-27 NOTE — Op Note (Signed)
NAMEROHIT, DELORIA            ACCOUNT NO.:  [id number]   MEDICAL RECORD NO.:  [id number]          PATIENT TYPE:  AMB   LOCATION:  ENDO                         FACILITY:  [facility]   PHYSICIAN:  Petra Kuba, M.D.    DATE OF BIRTH:  [birth year]   DATE OF PROCEDURE:  DATE OF DISCHARGE:                               OPERATIVE REPORT   PROCEDURE:  Colonoscopy with polypectomy.   INDICATIONS:  Patient with a difficult to remove sigmoid polyp, want to  repeat colonoscopy with prolonged attempt at complete polypectomy.  Consent was signed after risks, benefits, methods and options were  thoroughly discussed multiple times in the past.   MEDICINES USED:  A combination of versed, fentanyl and Diprivan per  anesthesia.   PROCEDURE:  Rectal inspection is pertinent for external hemorrhoids.  Digital exam was negative.  The video colonoscope was inserted and  easily advanced to the distal to mid sigmoid where the large polyp was  seen on a broad stalk. Based on adequate positioning, we went ahead and  tried to snare. Probably 15 different pieces were snared in a piecemeal  fashion. As far as we know, all were is either suctioned through the  scope, suctioned onto the head of the scope or grabbed with the snare  and the scope was removed.  We could not ever until the end get all of  the snares around the complete poly but we did whittle it away in the  piecemeal fashion. At one point, he seemed to fill up with air and we  went ahead and advanced probably to the cecum.  Other than some  diverticula on the left side, no other obvious abnormalities were seen  as we advanced to the cecum.  The prep on the right side was only fair.  We elected to withdrawal and suction air as compared to take a good look  for any residual polyps or missed polyps from his previous colonoscopy.  No obvious ones were seen. When we withdrew back to the rectum, a small  rectal polyp was seen, this was snared  electrocautery applied and was  suctioned through the scope and collected in the trap and put in a  separate container.  We then went ahead and tried the upper endoscope  thinking if it was more flexible we would be able to snare the polyp any  better and a few pieces were snared this way, but it seemed like we had  better luck with the regular scope. The endoscope was withdrawn and the  regular scope was reinserted.  We then continued the piecemeal fashion,  removing 1-2 cm pieces on multiple occasions withdrawing the scope with  the polyp suctioned onto the head at least 5-10 times. We did try all  snares available to [place] with at least three different kinds including a  hexagon and two different types of jumbos. Finally after multiple  attempts, it seemed that we had removed the polyp completely. At one  point, there seemed to be a second large polyp on a similar broad-based  stalk just next to this but due to spasm  at the end of procedure we are  not sure if this was completely removed or not. After our prolonged  effort, we elected to stop the procedure at this junction. There was a  little bit of bleeding from some of the snares early on but none at the  end of the procedure. The scope was reinserted probably to the mid  descending, air was suctioned, scope removed.  The patient tolerated the  procedure adequately.  There was no obvious immediate complication.   ENDOSCOPIC DIAGNOSES:  1. Internal and external small hemorrhoids that were evaluated on      straight and retroflex visualization not mentioned above.  2. Small rectal polyp status post snare.  3. Large mid sigmoid polyp on a broad base possibly even two of these      status post multiple snares with multiple devices using two      different scopes. We believe all pieces removed.  4. A quick look at the rest of the colon without significant      abnormality to the cecum.   PLAN:  Await pathology.  Hold aspirin and  nonsteroidals, not only from  this procedure but from his ulcer history. Based on pathology decide  either surgical options, further workup and plans, etc. Repeat  colonoscopy at the appropriate time.           ______________________________  Petra Kuba, M.D.     MEM/MEDQ  D:  03/14/2007  T:  03/14/2007  Job:  [id number]

## 2010-07-27 NOTE — Discharge Summary (Signed)
[name], [name]            ACCOUNT NO.:  [id number]   MEDICAL RECORD NO.:  [id number]          PATIENT TYPE:  INP   LOCATION:  3738                         FACILITY:  [facility]   PHYSICIAN:  Titus Dubin. Hopper, MD,FACP,FCCPDATE OF BIRTH:  [birth year]   DATE OF ADMISSION:  12/28/2006  DATE OF DISCHARGE:  12/30/2006                               DISCHARGE SUMMARY   ADMISSION DIAGNOSES:  1. Gastrointestinal bleed.  2. Leukocytosis.  3. Tobacco use.   Please see the history and physical (#045409) dictated December 28, 2006.  The presentation was one of bloating and then diarrhea.  He was admitted  with evidence of active GI bleed with a drop in hemoglobin from 10.3-  7.8.  He received 2 units of packed cells, and H&H were monitored.  Initially, his white count was 16.1 but dropped to 14.6.  He was  afebrile, and this was felt to be a stress response to the acute GI  bleed.   He was seen in consultation by Dr. Ewing Schlein who performed endoscopy on  December 29, 2006 which revealed a distal bulb ulcer without visible  vessel or bleeding.  There was some scalloping changes in the duodenal  bulb which were biopsied.  There were two gastroesophageal erosions  noted as well.   On the day of discharge, he had eaten breakfast without difficulty.  He  had been up walking in the hall on two occasions with no tachycardia,  dyspnea, chest pain, or other signs of hypertension.  His blood pressure  had been 97/57 which was stable.  Pulse was 81 and regular, respiratory  rate 20.  He was afebrile.  His stools were noted to be black the  evening of December 29, 2006 and then slightly pink the morning of  discharge.  O2 saturations were 100%, and he exhibited no increased work  of breathing.  Rhythm was regular with no significant murmur.  Bowel  sounds were active.  Abdomen was nontender.   Hemoglobin and hematocrit were 9 and 25.4.   Triggers for reflux and hyperacidity and preulcer were reviewed.   He  does take nonsteroidals occasionally after playing golf.  He denies any  alcohol intake.  He will have an occasional peppermint.  He does smoke  but is trying to discontinue this.  He has occasional colas and  occasional chocolate.  He denies any significant coffee intake or tea  intake.   Because of his stability, he was discharged with advancement of diet as  tolerated.  It was recommended that he avoid popcorn, peanuts, or other  high roughage items.  He was to be discharged on Prilosec 20 mg 30  minutes before breakfast and 30 minutes before the evening meal.  He was  also discharged on daily multivitamins with iron.   He is to see Dr. Ewing Schlein in 6 weeks.  Pending at the time of discharge are  the H pylori results on the biopsy of the duodenum.   FINAL DIAGNOSIS:  Upper gastrointestinal bleed due to gastroesophageal  erosions and duodenal ulcer with subsequent anemia.   CONDITION ON DISCHARGE:  Improved.  PROGNOSIS:  Prognosis depends on his addressing the triggers, as this  represents his second episode of ulcer.  By history, he had an ulcer in  1995.   ACTIVITY:  Activities will be as tolerated.   DISCHARGE INSTRUCTIONS:  He was told to return to the emergency room  should he have tachycardia, shortness of breath, chest pain, or active  GI bleeding as evidenced by increasing melena or rectal bleeding or  hematemesis.      Titus Dubin. Alwyn Ren, MD,FACP,FCCP  Electronically Signed     [facility]/MEDQ  D:  12/30/2006  T:  01/01/2007  Job:  [id number]   cc:   Petra Kuba, M.D.

## 2010-09-25 ENCOUNTER — Other Ambulatory Visit: Payer: Self-pay | Admitting: Internal Medicine

## 2010-12-22 LAB — COMPREHENSIVE METABOLIC PANEL
AST: 12
Albumin: 2.8 — ABNORMAL LOW
Chloride: 108
Creatinine, Ser: 0.92
GFR calc Af Amer: >60
Potassium: 3.9
Total Bilirubin: 0.6

## 2010-12-22 LAB — CBC
HCT: 22.9 — ABNORMAL LOW
HCT: 30.3 — ABNORMAL LOW
Hemoglobin: 10.3 — ABNORMAL LOW
Hemoglobin: 7.8 — CL
Hemoglobin: 9 — ABNORMAL LOW
MCV: 90.1
Platelets: 196
RBC: 2.85 — ABNORMAL LOW
RBC: 3.27 — ABNORMAL LOW
WBC: 11.4 — ABNORMAL HIGH
WBC: 14.6 — ABNORMAL HIGH
WBC: 8.9

## 2010-12-22 LAB — BASIC METABOLIC PANEL
Chloride: 111
GFR calc non Af Amer: >60
Potassium: 3.7
Sodium: 138

## 2010-12-22 LAB — I-STAT 8, (EC8 V) (CONVERTED LAB)
BUN: 32 — ABNORMAL HIGH
Bicarbonate: 22.7
Chloride: 108
Glucose, Bld: 138 — ABNORMAL HIGH
HCT: 31 — ABNORMAL LOW
pCO2, Ven: 44.8 — ABNORMAL LOW
pH, Ven: 7.313 — ABNORMAL HIGH

## 2010-12-22 LAB — URINALYSIS, ROUTINE W REFLEX MICROSCOPIC
Glucose, UA: NEGATIVE
Protein, ur: NEGATIVE
Specific Gravity, Urine: 1.023
pH: 5.5

## 2010-12-22 LAB — HEPATIC FUNCTION PANEL
ALT: 15
AST: 15
Albumin: 3.2 — ABNORMAL LOW
Alkaline Phosphatase: 58
Total Protein: 5.8 — ABNORMAL LOW

## 2010-12-22 LAB — OCCULT BLOOD X 1 CARD TO LAB, STOOL
Fecal Occult Bld: POSITIVE
Fecal Occult Bld: POSITIVE

## 2010-12-22 LAB — HEMOGLOBIN AND HEMATOCRIT, BLOOD
HCT: 25.7 — ABNORMAL LOW
HCT: 26 — ABNORMAL LOW
HCT: 26.5 — ABNORMAL LOW
Hemoglobin: 9 — ABNORMAL LOW
Hemoglobin: 9.1 — ABNORMAL LOW

## 2010-12-22 LAB — URINE MICROSCOPIC-ADD ON

## 2010-12-22 LAB — CROSSMATCH: Antibody Screen: NEGATIVE

## 2010-12-22 LAB — DIFFERENTIAL
Eosinophils Relative: 0
Lymphocytes Relative: 8 — ABNORMAL LOW
Monocytes Absolute: 0.4
Monocytes Relative: 3
Neutro Abs: 14.3 — ABNORMAL HIGH

## 2010-12-22 LAB — PROTIME-INR
INR: 1
Prothrombin Time: 13.5

## 2010-12-22 LAB — APTT: aPTT: 25

## 2010-12-22 LAB — ABO/RH: ABO/RH(D): O POS

## 2011-03-27 ENCOUNTER — Other Ambulatory Visit: Payer: Self-pay | Admitting: Internal Medicine

## 2011-05-21 ENCOUNTER — Other Ambulatory Visit: Payer: Self-pay | Admitting: Internal Medicine

## 2011-05-23 NOTE — Telephone Encounter (Signed)
Prescription denied. Per last refill request, patient needs appointment.

## 2011-05-27 ENCOUNTER — Other Ambulatory Visit: Payer: Self-pay | Admitting: Internal Medicine

## 2011-05-30 NOTE — Telephone Encounter (Signed)
Patient needs to schedule a CPX  

## 2011-07-28 ENCOUNTER — Other Ambulatory Visit: Payer: Self-pay | Admitting: Internal Medicine

## 2011-09-30 ENCOUNTER — Other Ambulatory Visit: Payer: Self-pay | Admitting: Internal Medicine

## 2011-09-30 NOTE — Telephone Encounter (Signed)
I called patient and informed him he is over-due for an appointment, patient states he will check his work schedule and call back to set up CPX. Patient aware #90 to be given and appointment MUST be schedule with in 90 days, patient verbalized understanding

## 2012-01-22 ENCOUNTER — Other Ambulatory Visit: Payer: Self-pay | Admitting: Internal Medicine

## 2012-01-23 NOTE — Telephone Encounter (Signed)
Unfortunately last seen 2008; he can get Prilosec OTC. Refills would require reestablishing   as a patient

## 2012-01-23 NOTE — Telephone Encounter (Signed)
Last OV 04/16/2010, Appointment Due indicated on last refill, no pending appointment scheduled Hopp please advise on refill request

## 2012-04-18 ENCOUNTER — Encounter: Payer: Self-pay | Admitting: Internal Medicine

## 2012-04-18 ENCOUNTER — Ambulatory Visit (INDEPENDENT_AMBULATORY_CARE_PROVIDER_SITE_OTHER): Payer: BC Managed Care – PPO | Admitting: Internal Medicine

## 2012-04-18 VITALS — BP 124/80 | HR 69 | Resp 14 | Ht 71.08 in | Wt 184.0 lb

## 2012-04-18 DIAGNOSIS — Z0000 Encounter for general adult medical examination without abnormal findings: Secondary | ICD-10-CM

## 2012-04-18 DIAGNOSIS — Z860100 Personal history of colon polyps, unspecified: Secondary | ICD-10-CM

## 2012-04-18 DIAGNOSIS — E785 Hyperlipidemia, unspecified: Secondary | ICD-10-CM

## 2012-04-18 LAB — CBC WITH DIFFERENTIAL/PLATELET
Basophils Absolute: 0 10*3/uL (ref 0.0–0.1)
Eosinophils Absolute: 0.2 10*3/uL (ref 0.0–0.7)
MCHC: 34 g/dL (ref 30.0–36.0)
MCV: 90.6 fl (ref 78.0–100.0)
Monocytes Absolute: 0.6 10*3/uL (ref 0.1–1.0)
Neutrophils Relative %: 69.3 % (ref 43.0–77.0)
Platelets: 261 10*3/uL (ref 150.0–400.0)
RDW: 13.1 % (ref 11.5–14.6)
WBC: 9.8 10*3/uL (ref 4.5–10.5)

## 2012-04-18 LAB — LIPID PANEL
HDL: 26.7 mg/dL — ABNORMAL LOW (ref >39.00)
Total CHOL/HDL Ratio: 9
Triglycerides: 276 mg/dL — ABNORMAL HIGH (ref 0.0–149.0)

## 2012-04-18 LAB — HEPATIC FUNCTION PANEL
Bilirubin, Direct: 0.1 mg/dL (ref 0.0–0.3)
Total Bilirubin: 1 mg/dL (ref 0.3–1.2)

## 2012-04-18 LAB — BASIC METABOLIC PANEL
BUN: 17 mg/dL (ref 6–23)
Chloride: 104 mEq/L (ref 96–112)
Creatinine, Ser: 1.1 mg/dL (ref 0.4–1.5)

## 2012-04-18 MED ORDER — OMEPRAZOLE 20 MG PO CPDR: 20.0000 mg | DELAYED_RELEASE_CAPSULE | Freq: Every day | ORAL | Status: DC

## 2012-04-18 NOTE — Progress Notes (Signed)
  Subjective:    Patient ID: [name], male    DOB: 26-Feb-1946, 67 y.o.   MRN: [id number]  HPI  [name] is here for a physical; he denies acute issues.      Review of Systems  He has had bleeding ulcers on 2 occasions. Reflux is well-controlled with a PPI Monday, Wednesday, and Friday. He specifically denies significant dyspepsia, dysphagia, abdominal pain, unexplained weight loss, melena, or rectal bleeding.  He does have a history of anemia with a bleeding ulcer. His wife is concerned about PPI possibly causing B12 deficiency  She's also recommended that he take 2000 international units of vitamin D daily.     Objective:   Physical Exam Gen.: thin but adequately nourished in appearance. Alert, appropriate and cooperative throughout exam.  Head: Normocephalic without obvious abnormalities; moustache ; alopecia  Eyes: No corneal or conjunctival inflammation noted. Pupils equal round reactive to light and accommodation. Fundal exam is benign without hemorrhages, exudate, papilledema. Extraocular motion intact. Vision grossly normal. Ears: External  ear exam reveals no significant lesions or deformities. Canals clear .TMs normal. Hearing is grossly normal bilaterally. Nose: External nasal exam reveals no deformity or inflammation. Nasal mucosa are pink and moist. No lesions or exudates noted.   Mouth: Oral mucosa and oropharynx reveal no lesions or exudates. Teeth in good repair. Neck: No deformities, masses, or tenderness noted. Range of motion & Thyroid  normal. Lungs: Normal respiratory effort; chest expands symmetrically. Lungs are clear to auscultation without rales, wheezes, or increased work of breathing.Increased AP diameter Heart: Normal rate and rhythm. Normal S1 and S2. No gallop, click, or rub S4 w/o  murmur. Abdomen: Bowel sounds normal; abdomen soft and nontender. No masses, organomegaly or hernias noted. Genitalia: Genitalia normal except for left varices.  Prostate is normal without enlargement, asymmetry, nodularity, or induration.  FOB negative                                  Musculoskeletal/extremities: No deformity or scoliosis noted of  the thoracic or lumbar spine. No clubbing, cyanosis, edema, or significant extremity  deformity noted. Range of motion normal .Tone & strength  normal.Joints normal . Nail health good. Able to lie down & sit up w/o help. Negative SLR bilaterally Vascular: Carotid, radial artery, dorsalis pedis and  posterior tibial pulses are full and equal. No bruits present. Neurologic: Alert and oriented x3. Deep tendon reflexes symmetrical and normal.  Skin: Intact without suspicious lesions or rashes. Lymph: No cervical, axillary, or inguinal lymphadenopathy present. Psych: Mood and affect are normal. Normally interactive                                                                                         Assessment & Plan:  #1 comprehensive physical exam; no acute findings  Plan: see Orders  & Recommendations

## 2012-04-18 NOTE — Addendum Note (Signed)
Addended by: Maurice Small on: 04/18/2012 11:49 AM   Modules accepted: Orders

## 2012-04-18 NOTE — Patient Instructions (Signed)
Preventive Health Care: Exercise at least 30-45 minutes a day,  3-4 days a week.  Eat a low-fat diet with lots of fruits and vegetables, up to 7-9 servings per day.  Consume less than 40 grams of sugar per day from foods & drinks with High Fructose Corn Sugar as #1,2,3 or # 4 on label. If you activate My Chart; the results can be released to you as soon as they populate from the lab. If you choose not to use this program; the labs have to be reviewed, copied & mailed   causing a delay in getting the results to you.  As per the Standard of Care , screening Colonoscopy recommended @ 50 & every 5-10 years thereafter . More frequent monitor would be dictated by family history or findings @ Colonoscopy. Review and correct the record as indicated. Please share record with all medical staff seen.

## 2012-04-20 ENCOUNTER — Other Ambulatory Visit (INDEPENDENT_AMBULATORY_CARE_PROVIDER_SITE_OTHER): Payer: BC Managed Care – PPO

## 2012-04-24 LAB — VITAMIN D 1,25 DIHYDROXY
Vitamin D 1, 25 (OH)2 Total: 58 pg/mL (ref 18–72)
Vitamin D2 1, 25 (OH)2: <8 pg/mL
Vitamin D3 1, 25 (OH)2: 58 pg/mL

## 2013-01-17 ENCOUNTER — Other Ambulatory Visit: Payer: Self-pay

## 2013-04-09 ENCOUNTER — Other Ambulatory Visit: Payer: Self-pay | Admitting: Internal Medicine

## 2013-04-09 NOTE — Telephone Encounter (Signed)
Omeprazole refilled per protocol. JG//CMA 

## 2014-09-08 ENCOUNTER — Other Ambulatory Visit: Payer: Self-pay

## 2016-12-16 DIAGNOSIS — Z23 Encounter for immunization: Secondary | ICD-10-CM | POA: Diagnosis not present

## 2017-01-12 DIAGNOSIS — Z23 Encounter for immunization: Secondary | ICD-10-CM | POA: Diagnosis not present

## 2017-03-24 DIAGNOSIS — Z23 Encounter for immunization: Secondary | ICD-10-CM | POA: Diagnosis not present

## 2017-05-18 DIAGNOSIS — H2513 Age-related nuclear cataract, bilateral: Secondary | ICD-10-CM | POA: Diagnosis not present

## 2017-12-15 DIAGNOSIS — Z23 Encounter for immunization: Secondary | ICD-10-CM | POA: Diagnosis not present

## 2018-03-19 DIAGNOSIS — M9905 Segmental and somatic dysfunction of pelvic region: Secondary | ICD-10-CM | POA: Diagnosis not present

## 2018-03-19 DIAGNOSIS — M9903 Segmental and somatic dysfunction of lumbar region: Secondary | ICD-10-CM | POA: Diagnosis not present

## 2018-03-19 DIAGNOSIS — M9904 Segmental and somatic dysfunction of sacral region: Secondary | ICD-10-CM | POA: Diagnosis not present

## 2018-03-19 DIAGNOSIS — M60851 Other myositis, right thigh: Secondary | ICD-10-CM | POA: Diagnosis not present

## 2018-03-19 DIAGNOSIS — M545 Low back pain: Secondary | ICD-10-CM | POA: Diagnosis not present

## 2018-03-19 DIAGNOSIS — M5417 Radiculopathy, lumbosacral region: Secondary | ICD-10-CM | POA: Diagnosis not present

## 2018-03-21 DIAGNOSIS — M5417 Radiculopathy, lumbosacral region: Secondary | ICD-10-CM | POA: Diagnosis not present

## 2018-03-21 DIAGNOSIS — M60851 Other myositis, right thigh: Secondary | ICD-10-CM | POA: Diagnosis not present

## 2018-03-21 DIAGNOSIS — M9905 Segmental and somatic dysfunction of pelvic region: Secondary | ICD-10-CM | POA: Diagnosis not present

## 2018-03-21 DIAGNOSIS — M545 Low back pain: Secondary | ICD-10-CM | POA: Diagnosis not present

## 2018-03-21 DIAGNOSIS — M9903 Segmental and somatic dysfunction of lumbar region: Secondary | ICD-10-CM | POA: Diagnosis not present

## 2018-03-21 DIAGNOSIS — M9904 Segmental and somatic dysfunction of sacral region: Secondary | ICD-10-CM | POA: Diagnosis not present

## 2018-03-22 ENCOUNTER — Ambulatory Visit: Payer: Self-pay | Admitting: Family Medicine

## 2018-03-23 ENCOUNTER — Ambulatory Visit: Payer: Self-pay | Admitting: Family Medicine

## 2018-03-23 ENCOUNTER — Ambulatory Visit (INDEPENDENT_AMBULATORY_CARE_PROVIDER_SITE_OTHER): Payer: Medicare Other | Admitting: Family Medicine

## 2018-03-23 ENCOUNTER — Encounter: Payer: Self-pay | Admitting: Family Medicine

## 2018-03-23 VITALS — BP 180/103 | HR 76 | Temp 97.7°F | Ht 71.0 in | Wt 204.4 lb

## 2018-03-23 DIAGNOSIS — M5417 Radiculopathy, lumbosacral region: Secondary | ICD-10-CM | POA: Diagnosis not present

## 2018-03-23 DIAGNOSIS — I10 Essential (primary) hypertension: Secondary | ICD-10-CM

## 2018-03-23 DIAGNOSIS — M9903 Segmental and somatic dysfunction of lumbar region: Secondary | ICD-10-CM | POA: Diagnosis not present

## 2018-03-23 DIAGNOSIS — M545 Low back pain, unspecified: Secondary | ICD-10-CM | POA: Insufficient documentation

## 2018-03-23 DIAGNOSIS — Z860100 Personal history of colon polyps, unspecified: Secondary | ICD-10-CM | POA: Insufficient documentation

## 2018-03-23 DIAGNOSIS — Z1159 Encounter for screening for other viral diseases: Secondary | ICD-10-CM | POA: Diagnosis not present

## 2018-03-23 DIAGNOSIS — Z13220 Encounter for screening for lipoid disorders: Secondary | ICD-10-CM | POA: Diagnosis not present

## 2018-03-23 DIAGNOSIS — M60851 Other myositis, right thigh: Secondary | ICD-10-CM | POA: Diagnosis not present

## 2018-03-23 DIAGNOSIS — M9904 Segmental and somatic dysfunction of sacral region: Secondary | ICD-10-CM | POA: Diagnosis not present

## 2018-03-23 DIAGNOSIS — Z8711 Personal history of peptic ulcer disease: Secondary | ICD-10-CM | POA: Diagnosis not present

## 2018-03-23 DIAGNOSIS — M9905 Segmental and somatic dysfunction of pelvic region: Secondary | ICD-10-CM | POA: Diagnosis not present

## 2018-03-23 MED ORDER — AMLODIPINE BESYLATE 10 MG PO TABS: 10.0000 mg | ORAL_TABLET | Freq: Every day | ORAL | 3 refills | Status: DC

## 2018-03-23 NOTE — Patient Instructions (Addendum)
It was very nice to see you today!  Please start amlodipine.  Please take 1 pill once daily.  Please make sure you are watching your salt intake.  Please try to get at least 30 minutes of activity a day.  We will check blood work today.  Please come back to see me in 1 to 2 weeks for blood pressure recheck.  Take care, Dr Jerline Pain   [facility] Eating Plan DASH stands for "Dietary Approaches to Stop Hypertension." The DASH eating plan is a healthy eating plan that has been shown to reduce high blood pressure (hypertension). It may also reduce your risk for type 2 diabetes, heart disease, and stroke. The DASH eating plan may also help with weight loss. What are tips for following this plan?  General guidelines  Avoid eating more than 2,300 mg (milligrams) of salt (sodium) a day. If you have hypertension, you may need to reduce your sodium intake to 1,500 mg a day.  Limit alcohol intake to no more than 1 drink a day for nonpregnant women and 2 drinks a day for men. One drink equals 12 oz of beer, 5 oz of wine, or 1 oz of hard liquor.  Work with your health care provider to maintain a healthy body weight or to lose weight. Ask what an ideal weight is for you.  Get at least 30 minutes of exercise that causes your heart to beat faster (aerobic exercise) most days of the week. Activities may include walking, swimming, or biking.  Work with your health care provider or diet and nutrition specialist (dietitian) to adjust your eating plan to your individual calorie needs. Reading food labels   Check food labels for the amount of sodium per serving. Choose foods with less than 5 percent of the Daily Value of sodium. Generally, foods with less than 300 mg of sodium per serving fit into this eating plan.  To find whole grains, look for the word "whole" as the first word in the ingredient list. Shopping  Buy products labeled as "low-sodium" or "no salt added."  Buy fresh foods. Avoid canned foods  and premade or frozen meals. Cooking  Avoid adding salt when cooking. Use salt-free seasonings or herbs instead of table salt or sea salt. Check with your health care provider or pharmacist before using salt substitutes.  Do not fry foods. Cook foods using healthy methods such as baking, boiling, grilling, and broiling instead.  Cook with heart-healthy oils, such as olive, canola, soybean, or sunflower oil. Meal planning  Eat a balanced diet that includes: ? 5 or more servings of fruits and vegetables each day. At each meal, try to fill half of your plate with fruits and vegetables. ? Up to 6-8 servings of whole grains each day. ? Less than 6 oz of lean meat, poultry, or fish each day. A 3-oz serving of meat is about the same size as a deck of cards. One egg equals 1 oz. ? 2 servings of low-fat dairy each day. ? A serving of nuts, seeds, or beans 5 times each week. ? Heart-healthy fats. Healthy fats called Omega-3 fatty acids are found in foods such as flaxseeds and coldwater fish, like sardines, salmon, and mackerel.  Limit how much you eat of the following: ? Canned or prepackaged foods. ? Food that is high in trans fat, such as fried foods. ? Food that is high in saturated fat, such as fatty meat. ? Sweets, desserts, sugary drinks, and other foods with added sugar. ?  Full-fat dairy products.  Do not salt foods before eating.  Try to eat at least 2 vegetarian meals each week.  Eat more home-cooked food and less restaurant, buffet, and fast food.  When eating at a restaurant, ask that your food be prepared with less salt or no salt, if possible. What foods are recommended? The items listed may not be a complete list. Talk with your dietitian about what dietary choices are best for you. Grains Whole-grain or whole-wheat bread. Whole-grain or whole-wheat pasta. Brown rice. [place]ena Morrow. Bulgur. Whole-grain and low-sodium cereals. Pita bread. Low-fat, low-sodium crackers.  Whole-wheat flour tortillas. Vegetables Fresh or frozen vegetables (raw, steamed, roasted, or grilled). Low-sodium or reduced-sodium tomato and vegetable juice. Low-sodium or reduced-sodium tomato sauce and tomato paste. Low-sodium or reduced-sodium canned vegetables. Fruits All fresh, dried, or frozen fruit. Canned fruit in natural juice (without added sugar). Meat and other protein foods Skinless chicken or [place]. Ground chicken or [place]. Pork with fat trimmed off. Fish and seafood. Egg whites. Dried beans, peas, or lentils. Unsalted nuts, nut butters, and seeds. Unsalted canned beans. Lean cuts of beef with fat trimmed off. Low-sodium, lean deli meat. Dairy Low-fat (1%) or fat-free (skim) milk. Fat-free, low-fat, or reduced-fat cheeses. Nonfat, low-sodium ricotta or cottage cheese. Low-fat or nonfat yogurt. Low-fat, low-sodium cheese. Fats and oils Soft margarine without trans fats. Vegetable oil. Low-fat, reduced-fat, or light mayonnaise and salad dressings (reduced-sodium). Canola, safflower, olive, soybean, and sunflower oils. Avocado. Seasoning and other foods Herbs. Spices. Seasoning mixes without salt. Unsalted popcorn and pretzels. Fat-free sweets. What foods are not recommended? The items listed may not be a complete list. Talk with your dietitian about what dietary choices are best for you. Grains Baked goods made with fat, such as croissants, muffins, or some breads. Dry pasta or rice meal packs. Vegetables Creamed or fried vegetables. Vegetables in a cheese sauce. Regular canned vegetables (not low-sodium or reduced-sodium). Regular canned tomato sauce and paste (not low-sodium or reduced-sodium). Regular tomato and vegetable juice (not low-sodium or reduced-sodium). Angie Fava. Olives. Fruits Canned fruit in a light or heavy syrup. Fried fruit. Fruit in cream or butter sauce. Meat and other protein foods Fatty cuts of meat. Ribs. Fried meat. Berniece Salines. Sausage. Bologna and other  processed lunch meats. Salami. Fatback. Hotdogs. Bratwurst. Salted nuts and seeds. Canned beans with added salt. Canned or smoked fish. Whole eggs or egg yolks. Chicken or [place] with skin. Dairy Whole or 2% milk, cream, and half-and-half. Whole or full-fat cream cheese. Whole-fat or sweetened yogurt. Full-fat cheese. Nondairy creamers. Whipped toppings. Processed cheese and cheese spreads. Fats and oils Butter. Stick margarine. Lard. Shortening. Ghee. Bacon fat. Tropical oils, such as coconut, palm kernel, or palm oil. Seasoning and other foods Salted popcorn and pretzels. Onion salt, garlic salt, seasoned salt, table salt, and sea salt. Worcestershire sauce. Tartar sauce. Barbecue sauce. Teriyaki sauce. Soy sauce, including reduced-sodium. Steak sauce. Canned and packaged gravies. Fish sauce. Oyster sauce. Cocktail sauce. Horseradish that you find on the shelf. Ketchup. Mustard. Meat flavorings and tenderizers. Bouillon cubes. Hot sauce and Tabasco sauce. Premade or packaged marinades. Premade or packaged taco seasonings. Relishes. Regular salad dressings. Where to find more information:  National Heart, Lung, and [place]: https://wilson-eaton.com/  American Heart Association: www.heart.org Summary  The DASH eating plan is a healthy eating plan that has been shown to reduce high blood pressure (hypertension). It may also reduce your risk for type 2 diabetes, heart disease, and stroke.  With the DASH eating plan, you  should limit salt (sodium) intake to 2,300 mg a day. If you have hypertension, you may need to reduce your sodium intake to 1,500 mg a day.  When on the DASH eating plan, aim to eat more fresh fruits and vegetables, whole grains, lean proteins, low-fat dairy, and heart-healthy fats.  Work with your health care provider or diet and nutrition specialist (dietitian) to adjust your eating plan to your individual calorie needs. This information is not intended to replace advice given to  you by your health care provider. Make sure you discuss any questions you have with your health care provider. Document Released: 02/17/2011 Document Revised: 02/22/2016 Document Reviewed: 02/22/2016 Elsevier Interactive Patient Education  2019 [employer].

## 2018-03-23 NOTE — Assessment & Plan Note (Signed)
No signs of end organ damage or hypertensive emergency. Start norvasc 10mg  daily. Discussed possible side effects. Check CBC, CMET, and TSH. Discussed home BP monitoring with goal 140/90 or lower.  Discussed reasons to return to care or seek emergent care.  Follow-up with me in 1 to 2 weeks.

## 2018-03-23 NOTE — Assessment & Plan Note (Signed)
Recommend repeat colonoscopy however pt deferred for today. Will readdress at future OV.

## 2018-03-23 NOTE — Assessment & Plan Note (Signed)
No red flags. Continue conservative management.

## 2018-03-23 NOTE — Assessment & Plan Note (Signed)
Avoid NSAIDs 

## 2018-03-23 NOTE — Progress Notes (Signed)
Subjective:  [name] is a 73 y.o. male who presents today with a chief complaint of hypertension and to establish care.   HPI:  Hypertension, new problem Patient has been told he has borderline blood pressure in the past.  He randomly decided to check his blood pressure about a week ago and was found to have blood pressures in the 150s to 180s to 90s over 110's.  He has never been on any medications in the past.  No chest pain or shortness of breath.  No obvious triggering events.  He thinks that he has some family history of high blood pressure.  No home remedies tried.  Blood pressure is been stable over the last several days. No other alleviating or aggravating factors.   Back Pain He injured it while playing golf several months ago.  He is currently seeing a chiropractor for this.  Symptoms seem to be improving.  ROS: Per HPI, otherwise a complete review of systems was negative.   PMH:  The following were reviewed and entered/updated in epic: Past Medical History:  Diagnosis Date  . COLONIC POLYPS, HX OF 09/13/2007   Qualifier: Diagnosis of  By: Linna Darner MD, Gwyndolyn Saxon   Dr Clarene Essex, GI Colonoscopy @ 5 year intervals    . DUODENAL ULCER, HX OF 09/13/2007   Qualifier: Diagnosis of  By: Linna Darner MD, Gwyndolyn Saxon    . GERD (gastroesophageal reflux disease)   . Hyperlipidemia   . SKIN CANCER, HX OF 09/01/2009   Qualifier: Diagnosis of  By: Linna Darner MD, Fidela Juneau Cell  X 3-4; [facility]    . Ulcer 1989 & 2000   bleedng X2   Patient Active Problem List   Diagnosis Date Noted  . Essential hypertension 03/23/2018  . History of colon polyps 03/23/2018  . History of peptic ulcer 03/23/2018  . Low back pain 03/23/2018   Past Surgical History:  Procedure Laterality Date  . colonoscopy with polypectomy     Dr Watt Climes  . EYE SURGERY     post trauma with glss fragments  . INGUINAL HERNIA REPAIR  1970  . pilonidal cystectomy      Family History  Problem Relation Age of  Onset  . Stroke Mother        late 23s  . Diabetes Brother        Type II  . Hypertension Brother   . Hyperlipidemia Brother   . Heart disease Neg Hx   . Cancer Neg Hx     Medications- reviewed and updated Current Outpatient Medications  Medication Sig Dispense Refill  . amLODipine (NORVASC) 10 MG tablet Take 1 tablet (10 mg total) by mouth daily. 30 tablet 3   No current facility-administered medications for this visit.     Allergies-reviewed and updated Allergies  Allergen Reactions  . Ibuprofen   . Choline Fenofibrate     REACTION: ABDOMINAL PAIN, CONSTIPATION    Social History   Socioeconomic History  . Marital status: Married    Spouse name: Not on file  . Number of children: Not on file  . Years of education: Not on file  . Highest education level: Not on file  Occupational History  . Not on file  Social Needs  . Financial resource strain: Not on file  . Food insecurity:    Worry: Not on file    Inability: Not on file  . Transportation needs:    Medical: Not on file    Non-medical: Not  on file  Tobacco Use  . Smoking status: Former Smoker    Last attempt to quit: 03/15/2011    Years since quitting: 7.0  . Smokeless tobacco: Never Used  Substance and Sexual Activity  . Alcohol use: No  . Drug use: No  . Sexual activity: Not on file  Lifestyle  . Physical activity:    Days per week: Not on file    Minutes per session: Not on file  . Stress: Not on file  Relationships  . Social connections:    Talks on phone: Not on file    Gets together: Not on file    Attends religious service: Not on file    Active member of club or organization: Not on file    Attends meetings of clubs or organizations: Not on file    Relationship status: Not on file  Other Topics Concern  . Not on file  Social History Narrative  . Not on file     Objective:  Physical Exam: BP (!) 180/103 (BP Location: Left Arm, Patient Position: Sitting, Cuff Size: Large)   Pulse 76    Temp 97.7 F (36.5 C) (Oral)   Ht 5\' 11"  (1.803 m)   Wt 204 lb 6.1 oz (92.7 kg)   BMI 28.51 kg/m   Gen: NAD, resting comfortably CV: RRR with no murmurs appreciated Pulm: NWOB, CTAB with no crackles, wheezes, or rhonchi GI: Normal bowel sounds present. Soft, Nontender, Nondistended. MSK: No edema, cyanosis, or clubbing noted Skin: Warm, dry Neuro: Grossly normal, moves all extremities Psych: Normal affect and thought content  Assessment/Plan:  Essential hypertension No signs of end organ damage or hypertensive emergency. Start norvasc 10mg  daily. Discussed possible side effects. Check CBC, CMET, and TSH. Discussed home BP monitoring with goal 140/90 or lower.  Discussed reasons to return to care or seek emergent care.  Follow-up with me in 1 to 2 weeks.  History of peptic ulcer Avoid NSAIDs.   Low back pain No red flags. Continue conservative management.   History of colon polyps Recommend repeat colonoscopy however pt deferred for today. Will readdress at future OV.   Preventative Healthcare Check lipid panel and hepatitis C antibody.  Patient deferred colonoscopy referral for today.  Algis Greenhouse. Jerline Pain, MD 03/23/2018 4:46 PM

## 2018-03-24 LAB — CBC
HCT: 51.3 % — ABNORMAL HIGH (ref 38.5–50.0)
Hemoglobin: 17.5 g/dL — ABNORMAL HIGH (ref 13.2–17.1)
MCH: 31.6 pg (ref 27.0–33.0)
MCHC: 34.1 g/dL (ref 32.0–36.0)
MCV: 92.6 fL (ref 80.0–100.0)
MPV: 10.1 fL (ref 7.5–12.5)
PLATELETS: 235 10*3/uL (ref 140–400)
RBC: 5.54 10*6/uL (ref 4.20–5.80)
RDW: 13.1 % (ref 11.0–15.0)
WBC: 8.8 10*3/uL (ref 3.8–10.8)

## 2018-03-24 LAB — LIPID PANEL
Cholesterol: 201 mg/dL — ABNORMAL HIGH (ref <200)
HDL: 29 mg/dL — ABNORMAL LOW (ref >40)
LDL Cholesterol (Calc): 140 mg/dL (calc) — ABNORMAL HIGH
Non-HDL Cholesterol (Calc): 172 mg/dL (calc) — ABNORMAL HIGH (ref <130)
Total CHOL/HDL Ratio: 6.9 (calc) — ABNORMAL HIGH (ref <5.0)
Triglycerides: 187 mg/dL — ABNORMAL HIGH (ref <150)

## 2018-03-24 LAB — COMPREHENSIVE METABOLIC PANEL
AG Ratio: 1.6 (calc) (ref 1.0–2.5)
ALT: 12 U/L (ref 9–46)
AST: 12 U/L (ref 10–35)
Albumin: 4.2 g/dL (ref 3.6–5.1)
Alkaline phosphatase (APISO): 90 U/L (ref 40–115)
BUN/Creatinine Ratio: 10 (calc) (ref 6–22)
BUN: 12 mg/dL (ref 7–25)
CO2: 25 mmol/L (ref 20–32)
Calcium: 11 mg/dL — ABNORMAL HIGH (ref 8.6–10.3)
Chloride: 105 mmol/L (ref 98–110)
Creat: 1.22 mg/dL — ABNORMAL HIGH (ref 0.70–1.18)
GLUCOSE: 99 mg/dL (ref 65–99)
Globulin: 2.7 g/dL (calc) (ref 1.9–3.7)
Potassium: 4 mmol/L (ref 3.5–5.3)
Sodium: 139 mmol/L (ref 135–146)
Total Bilirubin: 0.7 mg/dL (ref 0.2–1.2)
Total Protein: 6.9 g/dL (ref 6.1–8.1)

## 2018-03-24 LAB — HEPATITIS C ANTIBODY
Hepatitis C Ab: NONREACTIVE
SIGNAL TO CUT-OFF: 0.05 (ref <1.00)

## 2018-03-24 LAB — TSH: TSH: 2.43 mIU/L (ref 0.40–4.50)

## 2018-03-26 ENCOUNTER — Encounter: Payer: Self-pay | Admitting: Family Medicine

## 2018-03-26 ENCOUNTER — Other Ambulatory Visit: Payer: Self-pay

## 2018-03-26 DIAGNOSIS — M9904 Segmental and somatic dysfunction of sacral region: Secondary | ICD-10-CM | POA: Diagnosis not present

## 2018-03-26 DIAGNOSIS — M545 Low back pain: Secondary | ICD-10-CM | POA: Diagnosis not present

## 2018-03-26 DIAGNOSIS — M5417 Radiculopathy, lumbosacral region: Secondary | ICD-10-CM | POA: Diagnosis not present

## 2018-03-26 DIAGNOSIS — M9903 Segmental and somatic dysfunction of lumbar region: Secondary | ICD-10-CM | POA: Diagnosis not present

## 2018-03-26 DIAGNOSIS — M60851 Other myositis, right thigh: Secondary | ICD-10-CM | POA: Diagnosis not present

## 2018-03-26 DIAGNOSIS — M9905 Segmental and somatic dysfunction of pelvic region: Secondary | ICD-10-CM | POA: Diagnosis not present

## 2018-03-26 DIAGNOSIS — E785 Hyperlipidemia, unspecified: Secondary | ICD-10-CM | POA: Insufficient documentation

## 2018-03-26 MED ORDER — ATORVASTATIN CALCIUM 40 MG PO TABS: 40.0000 mg | ORAL_TABLET | Freq: Every day | ORAL | 3 refills | Status: DC

## 2018-03-26 NOTE — Progress Notes (Signed)
Please inform patient of the following:  His cholesterol is high. Strongly recommend starting lipitor 40mg  daily to lower numbers and reduce risk of heart attack and stroke. He should continue working on diet and exercise as well.  All of his other labs are normal.  Recommend rechecking in 6-12 months. [name]. Jerline Pain, MD 03/26/2018 8:19 AM

## 2018-03-29 DIAGNOSIS — M60851 Other myositis, right thigh: Secondary | ICD-10-CM | POA: Diagnosis not present

## 2018-03-29 DIAGNOSIS — M545 Low back pain: Secondary | ICD-10-CM | POA: Diagnosis not present

## 2018-03-29 DIAGNOSIS — M9904 Segmental and somatic dysfunction of sacral region: Secondary | ICD-10-CM | POA: Diagnosis not present

## 2018-03-29 DIAGNOSIS — M9903 Segmental and somatic dysfunction of lumbar region: Secondary | ICD-10-CM | POA: Diagnosis not present

## 2018-03-29 DIAGNOSIS — M5417 Radiculopathy, lumbosacral region: Secondary | ICD-10-CM | POA: Diagnosis not present

## 2018-03-29 DIAGNOSIS — M9905 Segmental and somatic dysfunction of pelvic region: Secondary | ICD-10-CM | POA: Diagnosis not present

## 2018-04-02 DIAGNOSIS — M9904 Segmental and somatic dysfunction of sacral region: Secondary | ICD-10-CM | POA: Diagnosis not present

## 2018-04-02 DIAGNOSIS — M9903 Segmental and somatic dysfunction of lumbar region: Secondary | ICD-10-CM | POA: Diagnosis not present

## 2018-04-02 DIAGNOSIS — M9905 Segmental and somatic dysfunction of pelvic region: Secondary | ICD-10-CM | POA: Diagnosis not present

## 2018-04-02 DIAGNOSIS — M60851 Other myositis, right thigh: Secondary | ICD-10-CM | POA: Diagnosis not present

## 2018-04-02 DIAGNOSIS — M5417 Radiculopathy, lumbosacral region: Secondary | ICD-10-CM | POA: Diagnosis not present

## 2018-04-02 DIAGNOSIS — M545 Low back pain: Secondary | ICD-10-CM | POA: Diagnosis not present

## 2018-04-04 DIAGNOSIS — L821 Other seborrheic keratosis: Secondary | ICD-10-CM | POA: Diagnosis not present

## 2018-04-04 DIAGNOSIS — L820 Inflamed seborrheic keratosis: Secondary | ICD-10-CM | POA: Diagnosis not present

## 2018-04-04 DIAGNOSIS — L720 Epidermal cyst: Secondary | ICD-10-CM | POA: Diagnosis not present

## 2018-04-04 DIAGNOSIS — D1801 Hemangioma of skin and subcutaneous tissue: Secondary | ICD-10-CM | POA: Diagnosis not present

## 2018-04-04 DIAGNOSIS — Z85828 Personal history of other malignant neoplasm of skin: Secondary | ICD-10-CM | POA: Diagnosis not present

## 2018-04-05 DIAGNOSIS — M545 Low back pain: Secondary | ICD-10-CM | POA: Diagnosis not present

## 2018-04-05 DIAGNOSIS — M9903 Segmental and somatic dysfunction of lumbar region: Secondary | ICD-10-CM | POA: Diagnosis not present

## 2018-04-05 DIAGNOSIS — M5417 Radiculopathy, lumbosacral region: Secondary | ICD-10-CM | POA: Diagnosis not present

## 2018-04-05 DIAGNOSIS — M9905 Segmental and somatic dysfunction of pelvic region: Secondary | ICD-10-CM | POA: Diagnosis not present

## 2018-04-05 DIAGNOSIS — M9904 Segmental and somatic dysfunction of sacral region: Secondary | ICD-10-CM | POA: Diagnosis not present

## 2018-04-05 DIAGNOSIS — M60851 Other myositis, right thigh: Secondary | ICD-10-CM | POA: Diagnosis not present

## 2018-04-06 ENCOUNTER — Ambulatory Visit (INDEPENDENT_AMBULATORY_CARE_PROVIDER_SITE_OTHER): Payer: Medicare Other | Admitting: Family Medicine

## 2018-04-06 ENCOUNTER — Encounter: Payer: Self-pay | Admitting: Family Medicine

## 2018-04-06 VITALS — BP 132/80 | HR 82 | Temp 97.5°F | Ht 71.0 in | Wt 202.4 lb

## 2018-04-06 DIAGNOSIS — I10 Essential (primary) hypertension: Secondary | ICD-10-CM

## 2018-04-06 DIAGNOSIS — E785 Hyperlipidemia, unspecified: Secondary | ICD-10-CM | POA: Diagnosis not present

## 2018-04-06 DIAGNOSIS — Z86010 Personal history of colonic polyps: Secondary | ICD-10-CM

## 2018-04-06 DIAGNOSIS — B070 Plantar wart: Secondary | ICD-10-CM | POA: Diagnosis not present

## 2018-04-06 DIAGNOSIS — G250 Essential tremor: Secondary | ICD-10-CM | POA: Insufficient documentation

## 2018-04-06 DIAGNOSIS — M6740 Ganglion, unspecified site: Secondary | ICD-10-CM

## 2018-04-06 NOTE — Assessment & Plan Note (Signed)
Otherwise normal neurological exam.  Reassured patient.  Discussed possible pharmacologic management however patient declined.  We will continue with watchful waiting.

## 2018-04-06 NOTE — Progress Notes (Signed)
Chief Complaint:  [name] is a 73 y.o. male who presents today with a chief complaint of hypertension follow up.   Assessment/Plan:   Chronic medical conditions:  # Hypertension  - Currently on norvasc 10mg  daily and tolerating well - Home readings in the 130s/80s - ROS: No chest pain or shortness of breath A/P: Stable. Continue current medications.   # Dyslipidemia - Currently on lipitor 40mg  daily and tolerating well - ROS: No myalgias A/P: Stable. Continue current medications.   New/acute medical conditions:  # Plantar Wart Cryotherapy applied today.  See below procedure note.  # Ganglion cyst Recommended referral to sports medicine for further evaluation/management.  Patient declined for today.  Essential tremor Otherwise normal neurological exam.  Reassured patient.  Discussed possible pharmacologic management however patient declined.  We will continue with watchful waiting.  Preventative health care Recommended follow-up with GI soon for colonoscopy.  Subjective   Subjective:  HPI:  Please see assessment/plan section for problem based charting for his chronic medical conditions.  His new/acute problems are outlined below:  1. Foot Mass Several year history.  Located on the lateral aspect of his right foot.  Occasionally is painful when wearing tight fitting shoes.  No obvious trauma, injuries, or other obvious precipitating events.  It is overall stable in size.  No specific treatments tried.  2.  Plantar wart Also with several year history.  Located on the bottom of his left foot.  Has had one near the ball of his foot for several years and has had to have surgery in the past.  As a 1 towards the heel of his foot.  This is occasionally painful with weightbearing.  3.  Tremor Several year history.  Tremor is present at all times.  Sometimes makes it difficult to carry food or write.  ROS: Per HPI  PMH: He reports that he quit smoking about 7  years ago. He has never used smokeless tobacco. He reports that he does not drink alcohol or use drugs.    Objective   Objective:  Physical Exam: BP 132/80 (BP Location: Left Arm, Patient Position: Sitting, Cuff Size: Normal)   Pulse 82   Temp (!) 97.5 F (36.4 C) (Oral)   Ht 5\' 11"  (1.803 m)   Wt 202 lb 6.1 oz (91.8 kg)   SpO2 94%   BMI 28.23 kg/m   Gen: NAD, resting comfortably CV: Regular rate and rhythm with no murmurs appreciated Pulm: Normal work of breathing, clear to auscultation bilaterally with no crackles, wheezes, or rhonchi MSK:  - Right Foot: 2 to 3 mm freely mobile cystic mass on right lateral foot.  Tender to palpation. -Left foot: Plantar wart noted at first inter-joint space.  Plantar wart also noticed at calcaneus. Skin: Warm, dry Neuro: Very slight resting tremor noted. CN2-12 intact. Strength 5/5 in upper and lower extremities. Psych: Normal affect and thought content     Cryotherapy Procedure Note  Pre-operative Diagnosis: Plantar Warts  Locations: Left Foot  Indications: Therapeutic  Procedure Details  Patient informed of risks (permanent scarring, infection, light or dark discoloration, bleeding, infection, weakness, numbness and recurrence of the lesion) and benefits of the procedure and verbal informed consent obtained.  The areas are treated with liquid nitrogen therapy, frozen until ice ball extended 2 mm beyond lesion, allowed to thaw, and treated again. The patient tolerated procedure well.  The patient was instructed on post-op care, warned that there may be blister formation, redness and pain. Recommend  OTC analgesia as needed for pain.  Condition: Stable  Complications: none.  Algis Greenhouse. Jerline Pain, MD 04/06/2018 4:43 PM

## 2018-04-06 NOTE — Patient Instructions (Signed)
It was very nice to see you today!  Your blood pressure looks much better today.  Please keep an eye on it once or twice a week and let me know if persistently 140/90 or higher.  We froze your warts today.  Please let me know if they come back and I will get you to see a podiatrist.  Please come back to see Dr. Paulla Fore for the spot on your right foot seen.  The treatment that you are having is a benign, hereditary condition.  Please let me know if it worsens or starts to interfere with your daily activities.  Please come back to see me in 6 months, or sooner as needed.  Take care, Dr Jerline Pain

## 2018-04-09 DIAGNOSIS — M9905 Segmental and somatic dysfunction of pelvic region: Secondary | ICD-10-CM | POA: Diagnosis not present

## 2018-04-09 DIAGNOSIS — M60851 Other myositis, right thigh: Secondary | ICD-10-CM | POA: Diagnosis not present

## 2018-04-09 DIAGNOSIS — M9904 Segmental and somatic dysfunction of sacral region: Secondary | ICD-10-CM | POA: Diagnosis not present

## 2018-04-09 DIAGNOSIS — M5417 Radiculopathy, lumbosacral region: Secondary | ICD-10-CM | POA: Diagnosis not present

## 2018-04-09 DIAGNOSIS — M545 Low back pain: Secondary | ICD-10-CM | POA: Diagnosis not present

## 2018-04-09 DIAGNOSIS — M9903 Segmental and somatic dysfunction of lumbar region: Secondary | ICD-10-CM | POA: Diagnosis not present

## 2018-04-10 ENCOUNTER — Ambulatory Visit: Payer: Self-pay

## 2018-04-10 ENCOUNTER — Ambulatory Visit (INDEPENDENT_AMBULATORY_CARE_PROVIDER_SITE_OTHER): Payer: Medicare Other

## 2018-04-10 ENCOUNTER — Ambulatory Visit (INDEPENDENT_AMBULATORY_CARE_PROVIDER_SITE_OTHER): Payer: Medicare Other | Admitting: Sports Medicine

## 2018-04-10 ENCOUNTER — Encounter: Payer: Self-pay | Admitting: Sports Medicine

## 2018-04-10 VITALS — BP 136/80 | HR 80 | Ht 71.0 in | Wt 201.6 lb

## 2018-04-10 DIAGNOSIS — M545 Low back pain, unspecified: Secondary | ICD-10-CM

## 2018-04-10 DIAGNOSIS — M47816 Spondylosis without myelopathy or radiculopathy, lumbar region: Secondary | ICD-10-CM | POA: Diagnosis not present

## 2018-04-10 DIAGNOSIS — R1031 Right lower quadrant pain: Secondary | ICD-10-CM

## 2018-04-10 DIAGNOSIS — G8929 Other chronic pain: Secondary | ICD-10-CM

## 2018-04-10 DIAGNOSIS — M1611 Unilateral primary osteoarthritis, right hip: Secondary | ICD-10-CM

## 2018-04-10 NOTE — Patient Instructions (Signed)

## 2018-04-12 DIAGNOSIS — M60851 Other myositis, right thigh: Secondary | ICD-10-CM | POA: Diagnosis not present

## 2018-04-12 DIAGNOSIS — M9905 Segmental and somatic dysfunction of pelvic region: Secondary | ICD-10-CM | POA: Diagnosis not present

## 2018-04-12 DIAGNOSIS — M9903 Segmental and somatic dysfunction of lumbar region: Secondary | ICD-10-CM | POA: Diagnosis not present

## 2018-04-12 DIAGNOSIS — M9904 Segmental and somatic dysfunction of sacral region: Secondary | ICD-10-CM | POA: Diagnosis not present

## 2018-04-12 DIAGNOSIS — M5417 Radiculopathy, lumbosacral region: Secondary | ICD-10-CM | POA: Diagnosis not present

## 2018-04-12 DIAGNOSIS — M545 Low back pain: Secondary | ICD-10-CM | POA: Diagnosis not present

## 2018-04-15 ENCOUNTER — Encounter: Payer: Self-pay | Admitting: Sports Medicine

## 2018-04-15 NOTE — Procedures (Signed)
PROCEDURE NOTE:  Ultrasound Guided: Injection: Right hip Images were obtained and interpreted by myself, Teresa Coombs, DO  Images have been saved and stored to PACS system. Images obtained on: GE S7 Ultrasound machine    ULTRASOUND FINDINGS:  Small hip effusion with blunting of the labrum.  DESCRIPTION OF PROCEDURE:  The patient's clinical condition is marked by substantial pain and/or significant functional disability. Other conservative therapy has not provided relief, is contraindicated, or not appropriate. There is a reasonable likelihood that injection will significantly improve the patient's pain and/or functional impairment.   After discussing the risks, benefits and expected outcomes of the injection and all questions were reviewed and answered, the patient wished to undergo the above named procedure.  Verbal consent was obtained.  The ultrasound was used to identify the target structure and adjacent neurovascular structures. The skin was then prepped in sterile fashion and the target structure was injected under direct visualization using sterile technique as below:  Single injection performed as below: PREP: Alcohol and Ethel Chloride APPROACH:direct, stopcock technique, 22g 3.5 in. INJECTATE: 5 cc 1% lidocaine, 2 cc 0.5% Marcaine and 2 cc 40mg /mL DepoMedrol ASPIRATE: None DRESSING: Band-Aid  Post procedural instructions including recommending icing and warning signs for infection were reviewed.    This procedure was well tolerated and there were no complications.   IMPRESSION: Succesful Ultrasound Guided: Injection

## 2018-04-15 NOTE — Progress Notes (Signed)
[name]. Dewayne Jurek, [place] at [place] - 73 y.o. male MRN [id number]  Date of birth: [birth year]  Visit Date: 01/28/2020February 2, 2020  PCP: Vivi Barrack, MD   Referred by: Vivi Barrack, MD  SUBJECTIVE:   Chief Complaint  Patient presents with  . Right Hip - Initial Assessment    No IBU d/t hx of bleeding ulcer.   . Lower Back - Initial Assessment    Started June 2019 after testing golf clubs. Radiates to R hip/groin. Has seen Chiropractor, Briant Cedar in [place].   . Initial Assessment    HPI: Patient presents with 6 to 7 months of worsening right hip and back pain.  Describes this as occasional severe pain with grabbing especially when this is flared up.  After flareups it does seem to gradually go away.  It seem to have worsened after testing out a new set of golf clubs and hitting up to 200 balls.  He has mainly left low back pain that does radiate into the groin.  Pain is worse with first up in the morning as well as turning.  He is having pain with movement sitting and laying down.  He is unable to take anti-inflammatories and ibuprofen due to a bleeding ulcer.  He has been going to chiropractor for 3 weeks with only minimal improvement.  He has been performing home therapeutic exercise program as prescribed by the chiropractor this is not been significantly beneficial for him.  REVIEW OF SYSTEMS: Per HPI  Otherwise 12 point review of systems performed and is negative   HISTORY:  Prior history reviewed and updated per electronic medical record.  Patient Active Problem List   Diagnosis Date Noted  . Plantar wart 04/06/2018  . Essential tremor 04/06/2018  . Dyslipidemia 03/26/2018    The 10-year ASCVD risk score Mikey Bussing DC Brooke Bonito., et al., 2013) is: 42.9%   Values used to calculate the score:     Age: 71 years     Sex: Male     Is Non-Hispanic African American: No     Diabetic: No     Tobacco  smoker: No     Systolic Blood Pressure: 700 mmHg     Is BP treated: Yes     HDL Cholesterol: 29 mg/dL     Total Cholesterol: 201 mg/dL    . Essential hypertension 03/23/2018  . History of colon polyps 03/23/2018  . History of peptic ulcer 03/23/2018  . Low back pain 03/23/2018   Social History   Occupational History  . Not on file  Tobacco Use  . Smoking status: Former Smoker    Last attempt to quit: 03/15/2011    Years since quitting: 7.0  . Smokeless tobacco: Never Used  Substance and Sexual Activity  . Alcohol use: No  . Drug use: No  . Sexual activity: Not on file   Social History   Social History Narrative  . Not on file   Past Medical History:  Diagnosis Date  . COLONIC POLYPS, HX OF 09/13/2007   Qualifier: Diagnosis of  By: Linna Darner MD, Gwyndolyn Saxon   Dr Clarene Essex, GI Colonoscopy @ 5 year intervals    . DUODENAL ULCER, HX OF 09/13/2007   Qualifier: Diagnosis of  By: Linna Darner MD, Gwyndolyn Saxon    . GERD (gastroesophageal reflux disease)   . Hyperlipidemia   . SKIN CANCER, HX OF 09/01/2009   Qualifier: Diagnosis of  By:  Linna Darner MD, Fidela Juneau Cell  X 3-4; [facility]    . Ulcer 1989 & 2000   bleedng X2   Past Surgical History:  Procedure Laterality Date  . colonoscopy with polypectomy     Dr Watt Climes  . EYE SURGERY     post trauma with glss fragments  . INGUINAL HERNIA REPAIR  1970  . pilonidal cystectomy     family history includes Diabetes in his brother; Hyperlipidemia in his brother; Hypertension in his brother; Stroke in his mother. There is no history of Heart disease or Cancer. Current Outpatient Medications on File Prior to Visit  Medication Sig Dispense Refill  . amLODipine (NORVASC) 10 MG tablet Take 1 tablet (10 mg total) by mouth daily. 30 tablet 3  . atorvastatin (LIPITOR) 40 MG tablet Take 1 tablet (40 mg total) by mouth daily. 90 tablet 3   No current facility-administered medications on file prior to visit.    No results for input(s): HGBA1C,  LABURIC, CREATININE, CALCIUM, MG, AST, ALT, CKTOTAL, TSH in the last 72 hours.  OBJECTIVE:  VS:  HT:5\' 11"  (180.3 cm)   WT:201 lb 9.6 oz (91.4 kg)  BMI:28.13    BP:136/80  HR:80bpm  TEMP: ( )  RESP:92 %(denies SOB)   PHYSICAL EXAM:   Adult male.  No acute distress.  Alert and appropriate.  He has pain with straight leg raise as well as with internal and external rotation of the hips.  This is worse on the right.  He has markedly limited range of motion bilaterally, worse on the right.  Pain with Stinchfield testing.  His lower extremity strength and sensation is intact to light touch and manual muscle testing.  No significant overlying skin changes.  No significant hernia appreciated.  Abdomen soft and nontender.  Normal lower extremity pulses.  No significant lower extremity edema.   ASSESSMENT:   1. Chronic bilateral low back pain, unspecified whether sciatica present   2. Right inguinal pain   3. Primary osteoarthritis of right hip     PROCEDURES:  US Guided Injection per procedure note      PLAN:  Pertinent additional documentation may be included in corresponding procedure notes, imaging studies, problem based documentation and patient instructions.  No problem-specific Assessment & Plan notes found for this encounter.   Symptoms are consistent with hip osteoarthritis.  He should benefit from a ultrasound-guided injection performed today.  Continue previously prescribed home exercise program.   Activity modifications and the importance of avoiding exacerbating activities (limiting pain to no more than a 4 / 10 during or following activity) recommended and discussed.  Discussed red flag symptoms that warrant earlier emergent evaluation and patient voices understanding.   No orders of the defined types were placed in this encounter.  Lab Orders  No laboratory test(s) ordered today   Imaging Orders     DG Lumbar Spine Complete     DG HIPS BILAT W OR W/O  PELVIS MIN 5 VIEWS     [place] [facility] POCT ULTRASOUND Referral Orders  No referral(s) requested today    Return in about 6 weeks (around 05/22/2018).          Gerda Diss, [facility] Physician

## 2018-04-16 ENCOUNTER — Encounter: Payer: Self-pay | Admitting: Sports Medicine

## 2018-04-23 DIAGNOSIS — M545 Low back pain: Secondary | ICD-10-CM | POA: Diagnosis not present

## 2018-04-23 DIAGNOSIS — M5417 Radiculopathy, lumbosacral region: Secondary | ICD-10-CM | POA: Diagnosis not present

## 2018-04-23 DIAGNOSIS — M9904 Segmental and somatic dysfunction of sacral region: Secondary | ICD-10-CM | POA: Diagnosis not present

## 2018-04-23 DIAGNOSIS — M60851 Other myositis, right thigh: Secondary | ICD-10-CM | POA: Diagnosis not present

## 2018-04-23 DIAGNOSIS — M9905 Segmental and somatic dysfunction of pelvic region: Secondary | ICD-10-CM | POA: Diagnosis not present

## 2018-04-23 DIAGNOSIS — M9903 Segmental and somatic dysfunction of lumbar region: Secondary | ICD-10-CM | POA: Diagnosis not present

## 2018-04-30 DIAGNOSIS — M9903 Segmental and somatic dysfunction of lumbar region: Secondary | ICD-10-CM | POA: Diagnosis not present

## 2018-04-30 DIAGNOSIS — M60851 Other myositis, right thigh: Secondary | ICD-10-CM | POA: Diagnosis not present

## 2018-04-30 DIAGNOSIS — M9904 Segmental and somatic dysfunction of sacral region: Secondary | ICD-10-CM | POA: Diagnosis not present

## 2018-04-30 DIAGNOSIS — M545 Low back pain: Secondary | ICD-10-CM | POA: Diagnosis not present

## 2018-04-30 DIAGNOSIS — M9905 Segmental and somatic dysfunction of pelvic region: Secondary | ICD-10-CM | POA: Diagnosis not present

## 2018-04-30 DIAGNOSIS — M5417 Radiculopathy, lumbosacral region: Secondary | ICD-10-CM | POA: Diagnosis not present

## 2018-05-21 ENCOUNTER — Ambulatory Visit: Payer: Medicare Other | Admitting: Family Medicine

## 2018-05-22 ENCOUNTER — Ambulatory Visit: Payer: Medicare Other | Admitting: Sports Medicine

## 2018-05-22 ENCOUNTER — Ambulatory Visit: Payer: Medicare Other | Admitting: Family Medicine

## 2018-05-25 ENCOUNTER — Encounter: Payer: Self-pay | Admitting: Sports Medicine

## 2018-05-25 ENCOUNTER — Other Ambulatory Visit: Payer: Self-pay

## 2018-05-25 ENCOUNTER — Ambulatory Visit (INDEPENDENT_AMBULATORY_CARE_PROVIDER_SITE_OTHER): Payer: Medicare Other | Admitting: Sports Medicine

## 2018-05-25 VITALS — BP 138/82 | HR 76 | Ht 71.0 in | Wt 203.0 lb

## 2018-05-25 DIAGNOSIS — G8929 Other chronic pain: Secondary | ICD-10-CM

## 2018-05-25 DIAGNOSIS — M545 Low back pain: Secondary | ICD-10-CM | POA: Diagnosis not present

## 2018-05-25 DIAGNOSIS — M1611 Unilateral primary osteoarthritis, right hip: Secondary | ICD-10-CM

## 2018-05-25 NOTE — Progress Notes (Signed)
Juanda Bond. Rig[place], [place] at [place] - 73 y.o. male MRN [id number]  Date of birth: 01-Jul-1945  Visit Date: May 28, 2018  PCP: Vivi Barrack, MD   Referred by: Vivi Barrack, MD  SUBJECTIVE:  Chief Complaint  Patient presents with  . Lower Back - Follow-up    XR L-spine 04/10/2018.   . Right Hip - Follow-up    XR B hips 04/10/2018. Corticosteroid inj 04/10/2018. Has been seen by chiropractor.     HPI: Overall pain is only mild at this time.  Symptoms are improving.  Continues to have pain that radiates into the right leg but does improve with movement.  Is worsened with rotation and first thing in the morning.  He has continues to the chiropractor with some improvement in symptoms of his low back.  REVIEW OF SYSTEMS: Denies fevers, chills, recent weight gain or weight loss.  No night sweats.  Pt denies any change in bowel or bladder habits, muscle weakness, numbness or falls associated with this pain.  HISTORY:  Prior history reviewed and updated per electronic medical record.  Patient Active Problem List   Diagnosis Date Noted  . Plantar wart 04/06/2018  . Essential tremor 04/06/2018  . Dyslipidemia 03/26/2018    The 10-year ASCVD risk score Mikey Bussing DC Brooke Bonito., et al., 2013) is: 42.9%   Values used to calculate the score:     Age: 26 years     Sex: Male     Is Non-Hispanic African American: No     Diabetic: No     Tobacco smoker: No     Systolic Blood Pressure: 063 mmHg     Is BP treated: Yes     HDL Cholesterol: 29 mg/dL     Total Cholesterol: 201 mg/dL    . Essential hypertension 03/23/2018  . History of colon polyps 03/23/2018  . History of peptic ulcer 03/23/2018  . Low back pain 03/23/2018   Social History   Occupational History  . Not on file  Tobacco Use  . Smoking status: Former Smoker    Last attempt to quit: 03/15/2011    Years since quitting: 7.2  . Smokeless tobacco:  Never Used  Substance and Sexual Activity  . Alcohol use: No  . Drug use: No  . Sexual activity: Not on file   Social History   Social History Narrative  . Not on file    OBJECTIVE:  VS:  HT:5\' 11"  (180.3 cm)   WT:203 lb (92.1 kg)  BMI:28.33    BP:138/82  HR:76bpm  TEMP: ( )  RESP:92 %   PHYSICAL EXAM: Adult male. No acute distress.  Alert and appropriate. His right hip has marked improvements in his range of motion.  Negative Stinchfield test and no significant pain with terminal internal and external rotation however this is a limitation from right to left   ASSESSMENT:  1. Chronic bilateral low back pain, unspecified whether sciatica present   2. Primary osteoarthritis of right hip     PROCEDURES:  None  PLAN:  Pertinent additional documentation may be included in corresponding procedure notes, imaging studies, problem based documentation and patient instructions.  No problem-specific Assessment & Plan notes found for this encounter.   Overall is doing quite well following the injection.  Has had great improvements with return to his normal baseline without pain but still has some functional limitations.  He is happy with his  progress at this time and we discussed surgical versus nonsurgical continue management.  He will plan to call us for repeat injections in the future if He has any exacerbation of his pain.  Activity modifications and the importance of avoiding exacerbating activities (limiting pain to no more than a 4 / 10 during or following activity) recommended and discussed.   Discussed red flag symptoms that warrant earlier emergent evaluation and patient voices understanding.    No orders of the defined types were placed in this encounter.  Lab Orders  No laboratory test(s) ordered today   Imaging Orders  No imaging studies ordered today   Referral Orders  No referral(s) requested today    At follow up will plan to consider: repeat  corticosteroid injections  Return if symptoms worsen or fail to improve.          Gerda Diss, [facility] Physician

## 2018-05-28 ENCOUNTER — Encounter: Payer: Self-pay | Admitting: Sports Medicine

## 2018-06-14 ENCOUNTER — Other Ambulatory Visit: Payer: Self-pay | Admitting: Family Medicine

## 2018-10-05 ENCOUNTER — Ambulatory Visit: Payer: Medicare Other | Admitting: Family Medicine

## 2018-10-15 ENCOUNTER — Other Ambulatory Visit: Payer: Self-pay

## 2018-10-15 ENCOUNTER — Ambulatory Visit (INDEPENDENT_AMBULATORY_CARE_PROVIDER_SITE_OTHER): Payer: Medicare Other | Admitting: Family Medicine

## 2018-10-15 ENCOUNTER — Encounter: Payer: Self-pay | Admitting: Family Medicine

## 2018-10-15 VITALS — BP 124/68 | HR 90 | Temp 98.3°F | Ht 71.0 in | Wt 200.0 lb

## 2018-10-15 DIAGNOSIS — E785 Hyperlipidemia, unspecified: Secondary | ICD-10-CM | POA: Diagnosis not present

## 2018-10-15 DIAGNOSIS — I10 Essential (primary) hypertension: Secondary | ICD-10-CM

## 2018-10-15 DIAGNOSIS — N529 Male erectile dysfunction, unspecified: Secondary | ICD-10-CM | POA: Diagnosis not present

## 2018-10-15 MED ORDER — TADALAFIL 10 MG PO TABS: 10.0000 mg | ORAL_TABLET | Freq: Every day | ORAL | 0 refills | Status: DC | PRN

## 2018-10-15 NOTE — Patient Instructions (Signed)
It was very nice to see you today!  Your BP looks great! Continue your current medications.  Please try the cialis.  Come back in 1 year for your next check up, or sooner if needed.   Take care, Dr Jerline Pain  Please try these tips to maintain a healthy lifestyle:   Eat at least 3 REAL meals and 1-2 snacks per day.  Aim for no more than 5 hours between eating.  If you eat breakfast, please do so within one hour of getting up.    Obtain twice as many fruits/vegetables as protein or carbohydrate foods for both lunch and dinner. (Half of each meal should be fruits/vegetables, one quarter protein, and one quarter starchy carbs)   Cut down on sweet beverages. This includes juice, soda, and sweet tea.    Exercise at least 150 minutes every week.

## 2018-10-15 NOTE — Assessment & Plan Note (Signed)
At goal.  Continue Norvasc 10 mg daily.

## 2018-10-15 NOTE — Progress Notes (Signed)
   Chief Complaint:  [name] is a 73 y.o. male who presents today with a chief complaint of Essential hypertension.   Assessment/Plan:  Erectile dysfunction No red flags.  Start Cialis 10 mg daily as needed.  Discussed potential side effects.  Dyslipidemia Continue Lipitor 40 mg daily.  Check lipid panel next blood draw.  Essential hypertension At goal.  Continue Norvasc 10 mg daily.    Subjective:  HPI:  Erectile Dysfunction Started a few months ago. Stable over that time. Trouble getting and maintaining an erection. No reported urinary symptoms. No treatments tried. No other obvious alleviating or aggravating factors.   His stable, chronic medical conditions are outlined below:  # Essential Hypertension - On norvasc 10mg  daily and tolerating well - ROS: No reported chest pain or shortness of breath  # Dyslipidemia - On lipitor 40mg  daily and tolerating well - ROS: No reported mylagias  ROS: Per HPI  PMH: He reports that he quit smoking about 7 years ago. He has never used smokeless tobacco. He reports that he does not drink alcohol or use drugs.      Objective:  Physical Exam: BP 124/68 (BP Location: Left Arm, Patient Position: Sitting, Cuff Size: Normal)   Pulse 90   Temp 98.3 F (36.8 C) (Oral)   Ht 5\' 11"  (1.803 m)   Wt 200 lb (90.7 kg)   SpO2 93%   BMI 27.89 kg/m   Gen: NAD, resting comfortably CV: Regular rate and rhythm with no murmurs appreciated Pulm: Normal work of breathing, clear to auscultation bilaterally with no crackles, wheezes, or rhonchi     Lindberg Zenon M. Jerline Pain, MD 10/15/2018 1:40 PM

## 2018-10-15 NOTE — Assessment & Plan Note (Signed)
No red flags.  Start Cialis 10 mg daily as needed.  Discussed potential side effects.

## 2018-10-15 NOTE — Assessment & Plan Note (Signed)
Continue Lipitor 40 mg daily.  Check lipid panel next blood draw.

## 2018-11-15 ENCOUNTER — Other Ambulatory Visit: Payer: Self-pay | Admitting: Family Medicine

## 2018-11-15 MED ORDER — TADALAFIL 10 MG PO TABS: 10.0000 mg | ORAL_TABLET | Freq: Every day | ORAL | 0 refills | Status: DC | PRN

## 2018-11-15 NOTE — Telephone Encounter (Signed)
Copied from [place] [id number]. Topic: Quick Communication - Rx Refill/Question >> Nov 15, 2018  2:43 PM Leward Quan A wrote: Medication: tadalafil (CIALIS) 10 MG tablet   Patient would like a 90 day supply  Has the patient contacted their pharmacy? Yes.   (Agent: If no, request that the patient contact the pharmacy for the refill.) (Agent: If yes, when and what did the pharmacy advise?)  Preferred Pharmacy (with phone number or street name): [facility] [address], [place] - [address] [phone] (Phone) [phone] (Fax    Agent: Please be advised that RX refills may take up to 3 business days. We ask that you follow-up with your pharmacy.

## 2018-11-15 NOTE — Telephone Encounter (Signed)
Patient called back to say that to get this Rx in a 90 day supply is a lot cheaper that getting for 30 days at a time. Please advise. tadalafil (CIALIS) 10 MG tablet

## 2018-12-04 ENCOUNTER — Other Ambulatory Visit: Payer: Self-pay | Admitting: Family Medicine

## 2018-12-19 ENCOUNTER — Encounter: Payer: Self-pay | Admitting: Family Medicine

## 2018-12-19 ENCOUNTER — Other Ambulatory Visit: Payer: Self-pay

## 2018-12-19 ENCOUNTER — Ambulatory Visit (INDEPENDENT_AMBULATORY_CARE_PROVIDER_SITE_OTHER): Payer: Medicare Other

## 2018-12-19 DIAGNOSIS — Z23 Encounter for immunization: Secondary | ICD-10-CM

## 2018-12-19 MED ORDER — TADALAFIL 10 MG PO TABS: 10.0000 mg | ORAL_TABLET | Freq: Every day | ORAL | 0 refills | Status: DC | PRN

## 2019-01-29 ENCOUNTER — Telehealth: Payer: Self-pay | Admitting: Family Medicine

## 2019-01-29 NOTE — Telephone Encounter (Signed)
I left a message asking the patient to call and schedule Medicare AWV with Courtney ([facility] Health Coach).  If patient calls back, please schedule Medicare Wellness Visit at next available opening.  VDM (Dee-Dee) °

## 2019-02-05 ENCOUNTER — Other Ambulatory Visit: Payer: Self-pay

## 2019-02-05 ENCOUNTER — Ambulatory Visit (INDEPENDENT_AMBULATORY_CARE_PROVIDER_SITE_OTHER): Payer: Medicare Other

## 2019-02-05 DIAGNOSIS — Z0000 Encounter for general adult medical examination without abnormal findings: Secondary | ICD-10-CM

## 2019-02-05 NOTE — Patient Instructions (Signed)
[name] , Thank you for taking time to come for your Medicare Wellness Visit. I appreciate your ongoing commitment to your health goals. Please review the following plan we discussed and let me know if I can assist you in the future.   Screening recommendations/referrals: Colorectal Screening: recommended; please review attached information   Vision and Dental Exams: Recommended annual ophthalmology exams for early detection of glaucoma and other disorders of the eye Recommended annual dental exams for proper oral hygiene  Vaccinations: Influenza vaccine: completed 12/19/18 Pneumococcal vaccine: we will request records Tdap vaccine: up to date; last 09/01/09  Shingles vaccine: Shingrix completed   Advanced directives: Please bring a copy of your POA (Power of [place]) and/or Living Will to your next appointment.  Goals: Recommend to drink at least 6-8 8oz glasses of water per day and consume a balanced diet rich in fresh fruits and vegetables.   [facility]  ADDRESS [address] [place], [place], [place] [address]  CONTACT Phone: [phone] Phone: [phone] Fax: [phone]  OFFICE HOURS Monday: 8:00 AM to 5:00 PM Tuesday: 8:00 AM to 5:00 PM Wednesday: 8:00 AM to 5:00 PM Thursday: 8:00 AM to 5:00 PM Friday: 8:00 AM to 5:00 PM  Next appointment: Please schedule your Annual Wellness Visit with your Nurse Health Advisor in one year.  Preventive Care 1 Years and Older, Male Preventive care refers to lifestyle choices and visits with your health care provider that can promote health and wellness. What does preventive care include?  A yearly physical exam. This is also called an annual well check.  Dental exams once or twice a year.  Routine eye exams. Ask your health care provider how often you should have your eyes checked.  Personal lifestyle choices, including:  Daily care of your teeth and gums.  Regular physical activity.  Eating a  healthy diet.  Avoiding tobacco and drug use.  Limiting alcohol use.  Practicing safe sex.  Taking low doses of aspirin every day if recommended by your health care provider..  Taking vitamin and mineral supplements as recommended by your health care provider. What happens during an annual well check? The services and screenings done by your health care provider during your annual well check will depend on your age, overall health, lifestyle risk factors, and family history of disease. Counseling  Your health care provider may ask you questions about your:  Alcohol use.  Tobacco use.  Drug use.  Emotional well-being.  Home and relationship well-being.  Sexual activity.  Eating habits.  History of falls.  Memory and ability to understand (cognition).  Work and work Statistician. Screening  You may have the following tests or measurements:  Height, weight, and BMI.  Blood pressure.  Lipid and cholesterol levels. These may be checked every 5 years, or more frequently if you are over 17 years old.  Skin check.  Lung cancer screening. You may have this screening every year starting at age 16 if you have a 30-pack-year history of smoking and currently smoke or have quit within the past 15 years.  Fecal occult blood test (FOBT) of the stool. You may have this test every year starting at age 27.  Flexible sigmoidoscopy or colonoscopy. You may have a sigmoidoscopy every 5 years or a colonoscopy every 10 years starting at age 63.  Prostate cancer screening. Recommendations will vary depending on your family history and other risks.  Hepatitis C blood test.  Hepatitis B blood test.  Sexually transmitted disease (STD)  testing.  Diabetes screening. This is done by checking your blood sugar (glucose) after you have not eaten for a while (fasting). You may have this done every 1-3 years.  Abdominal aortic aneurysm (AAA) screening. You may need this if you are a current  or former smoker.  Osteoporosis. You may be screened starting at age 98 if you are at high risk. Talk with your health care provider about your test results, treatment options, and if necessary, the need for more tests. Vaccines  Your health care provider may recommend certain vaccines, such as:  Influenza vaccine. This is recommended every year.  Tetanus, diphtheria, and acellular pertussis (Tdap, Td) vaccine. You may need a Td booster every 10 years.  Zoster vaccine. You may need this after age 27.  Pneumococcal 13-valent conjugate (PCV13) vaccine. One dose is recommended after age 13.  Pneumococcal polysaccharide (PPSV23) vaccine. One dose is recommended after age 54. Talk to your health care provider about which screenings and vaccines you need and how often you need them. This information is not intended to replace advice given to you by your health care provider. Make sure you discuss any questions you have with your health care provider. Document Released: 03/27/2015 Document Revised: 11/18/2015 Document Reviewed: 12/30/2014 Elsevier Interactive Patient Education  2017 [place] Prevention in the Home Falls can cause injuries. They can happen to people of all ages. There are many things you can do to make your home safe and to help prevent falls. What can I do on the outside of my home?  Regularly fix the edges of walkways and driveways and fix any cracks.  Remove anything that might make you trip as you walk through a door, such as a raised step or threshold.  Trim any bushes or trees on the path to your home.  Use bright outdoor lighting.  Clear any walking paths of anything that might make someone trip, such as rocks or tools.  Regularly check to see if handrails are loose or broken. Make sure that both sides of any steps have handrails.  Any raised decks and porches should have guardrails on the edges.  Have any leaves, snow, or ice cleared  regularly.  Use sand or salt on walking paths during winter.  Clean up any spills in your garage right away. This includes oil or grease spills. What can I do in the bathroom?  Use night lights.  Install grab bars by the toilet and in the tub and shower. Do not use towel bars as grab bars.  Use non-skid mats or decals in the tub or shower.  If you need to sit down in the shower, use a plastic, non-slip stool.  Keep the floor dry. Clean up any water that spills on the floor as soon as it happens.  Remove soap buildup in the tub or shower regularly.  Attach bath mats securely with double-sided non-slip rug tape.  Do not have throw rugs and other things on the floor that can make you trip. What can I do in the bedroom?  Use night lights.  Make sure that you have a light by your bed that is easy to reach.  Do not use any sheets or blankets that are too big for your bed. They should not hang down onto the floor.  Have a firm chair that has side arms. You can use this for support while you get dressed.  Do not have throw rugs and other things on the floor  that can make you trip. What can I do in the kitchen?  Clean up any spills right away.  Avoid walking on wet floors.  Keep items that you use a lot in easy-to-reach places.  If you need to reach something above you, use a strong step stool that has a grab bar.  Keep electrical cords out of the way.  Do not use floor polish or wax that makes floors slippery. If you must use wax, use non-skid floor wax.  Do not have throw rugs and other things on the floor that can make you trip. What can I do with my stairs?  Do not leave any items on the stairs.  Make sure that there are handrails on both sides of the stairs and use them. Fix handrails that are broken or loose. Make sure that handrails are as long as the stairways.  Check any carpeting to make sure that it is firmly attached to the stairs. Fix any carpet that is loose  or worn.  Avoid having throw rugs at the top or bottom of the stairs. If you do have throw rugs, attach them to the floor with carpet tape.  Make sure that you have a light switch at the top of the stairs and the bottom of the stairs. If you do not have them, ask someone to add them for you. What else can I do to help prevent falls?  Wear shoes that:  Do not have high heels.  Have rubber bottoms.  Are comfortable and fit you well.  Are closed at the toe. Do not wear sandals.  If you use a stepladder:  Make sure that it is fully opened. Do not climb a closed stepladder.  Make sure that both sides of the stepladder are locked into place.  Ask someone to hold it for you, if possible.  Clearly mark and make sure that you can see:  Any grab bars or handrails.  First and last steps.  Where the edge of each step is.  Use tools that help you move around (mobility aids) if they are needed. These include:  Canes.  Walkers.  Scooters.  Crutches.  Turn on the lights when you go into a dark area. Replace any light bulbs as soon as they burn out.  Set up your furniture so you have a clear path. Avoid moving your furniture around.  If any of your floors are uneven, fix them.  If there are any pets around you, be aware of where they are.  Review your medicines with your doctor. Some medicines can make you feel dizzy. This can increase your chance of falling. Ask your doctor what other things that you can do to help prevent falls. This information is not intended to replace advice given to you by your health care provider. Make sure you discuss any questions you have with your health care provider. Document Released: 12/25/2008 Document Revised: 08/06/2015 Document Reviewed: 04/04/2014 Elsevier Interactive Patient Education  2017 [employer].

## 2019-02-05 NOTE — Progress Notes (Signed)
This visit is being conducted via phone call due to the COVID-19 pandemic. This patient has given me verbal consent via phone to conduct this visit, patient states they are participating from their home address. Some vital signs may be absent or patient reported.   Patient identification: identified by name, DOB, and current address.  Location provider: [facility], Office Persons participating in the virtual visit: Denman George LPN and Dr. Dimas Chyle     Subjective:   [name] is a 73 y.o. male who presents for an Initial Medicare Annual Wellness Visit.  Review of Systems   Cardiac Risk Factors include: advanced age (>62men, >60 women);male gender;hypertension;dyslipidemia   Objective:    Today's Vitals   02/05/19 1551  PainSc: 3    There is no height or weight on file to calculate BMI.  Advanced Directives 02/05/2019  Does Patient Have a Medical Advance Directive? Yes  Type of Advance Directive Living will;Healthcare Power of Attorney  Does patient want to make changes to medical advance directive? No - Patient declined  Copy of [place] in Chart? No - copy requested    Current Medications (verified) Outpatient Encounter Medications as of 02/05/2019  Medication Sig  . amLODipine (NORVASC) 10 MG tablet TAKE 1 TABLET BY MOUTH EVERY DAY  . atorvastatin (LIPITOR) 40 MG tablet Take 1 tablet (40 mg total) by mouth daily.  . tadalafil (CIALIS) 10 MG tablet Take 1 tablet (10 mg total) by mouth daily as needed for erectile dysfunction.   No facility-administered encounter medications on file as of 02/05/2019.     Allergies (verified) Ibuprofen and Choline fenofibrate   History: Past Medical History:  Diagnosis Date  . COLONIC POLYPS, HX OF 09/13/2007   Qualifier: Diagnosis of  By: Linna Darner MD, Gwyndolyn Saxon   Dr Clarene Essex, GI Colonoscopy @ 5 year intervals    . DUODENAL ULCER, HX OF 09/13/2007   Qualifier: Diagnosis of  By: Linna Darner MD, Gwyndolyn Saxon    .  GERD (gastroesophageal reflux disease)   . Hyperlipidemia   . SKIN CANCER, HX OF 09/01/2009   Qualifier: Diagnosis of  By: Linna Darner MD, Fidela [place] Cell  X 3-4; [facility]    . Ulcer 1989 & 2000   bleedng X2   Past Surgical History:  Procedure Laterality Date  . colonoscopy with polypectomy     Dr Watt Climes  . EYE SURGERY     post trauma with glss fragments  . INGUINAL HERNIA REPAIR  1970  . pilonidal cystectomy     Family History  Problem Relation Age of Onset  . Stroke Mother        late 1s  . Diabetes Brother        Type II  . Hypertension Brother   . Hyperlipidemia Brother   . Heart disease Neg Hx   . Cancer Neg Hx    Social History   Socioeconomic History  . Marital status: Married    Spouse name: Not on file  . Number of children: Not on file  . Years of education: Not on file  . Highest education level: Not on file  Occupational History  . Occupation: Retired   Scientific laboratory technician  . Financial resource strain: Not on file  . Food insecurity    Worry: Not on file    Inability: Not on file  . Transportation needs    Medical: Not on file    Non-medical: Not on file  Tobacco Use  .  Smoking status: Former Smoker    Quit date: 03/15/2011    Years since quitting: 7.9  . Smokeless tobacco: Never Used  Substance and Sexual Activity  . Alcohol use: No  . Drug use: No  . Sexual activity: Not on file  Lifestyle  . Physical activity    Days per week: Not on file    Minutes per session: Not on file  . Stress: Not on file  Relationships  . Social Herbalist on phone: Not on file    Gets together: Not on file    Attends religious service: Not on file    Active member of club or organization: Not on file    Attends meetings of clubs or organizations: Not on file    Relationship status: Not on file  Other Topics Concern  . Not on file  Social History Narrative  . Not on file   Tobacco Counseling Counseling given: Not Answered   Clinical Intake:   Pre-visit preparation completed: Yes  Pain : 0-10 Pain Score: 3  Pain Type: Chronic pain Pain Location: Hip Pain Orientation: Right Pain Onset: More than a month ago Pain Frequency: Intermittent Pain Relieving Factors: Steroid injection  Pain Relieving Factors: Steroid injection  Diabetes: No  How often do you need to have someone help you when you read instructions, pamphlets, or other written materials from your doctor or pharmacy?: 1 - Never  Interpreter Needed?: No  Information entered by :: Denman George LPN  Activities of Daily Living In your present state of health, do you have any difficulty performing the following activities: 02/05/2019  Hearing? N  Vision? N  Difficulty concentrating or making decisions? N  Walking or climbing stairs? N  Dressing or bathing? N  Doing errands, shopping? N  Preparing Food and eating ? N  Using the Toilet? N  In the past six months, have you accidently leaked urine? N  Do you have problems with loss of bowel control? N  Managing your Medications? N  Managing your Finances? N  Housekeeping or managing your Housekeeping? N  Some recent data might be hidden     Immunizations and Health Maintenance Immunization History  Administered Date(s) Administered  . Fluad Quad(high Dose 65+) 12/19/2018  . HPV Quadrivalent 12/13/2017  . Influenza Whole 12/13/2011  . Influenza-Unspecified 01/01/2015  . Td 09/01/2009  . Zoster Recombinat (Shingrix) 03/24/2017   Health Maintenance Due  Topic Date Due  . COLONOSCOPY  01/04/1998  . PNA vac Low Risk Adult (1 of 2 - PCV13) 01/04/2013    Patient Care Team: Vivi Barrack, MD as PCP - General (Family Medicine)  Indicate any recent Medical Services you may have received from other than [facility] providers in the past year (date may be approximate).    Assessment:   This is a routine wellness examination for [name].  Hearing/Vision screen No exam data present  Dietary issues and  exercise activities discussed: Current Exercise Habits: Home exercise routine, Time (Minutes): 30, Frequency (Times/Week): 4, Weekly Exercise (Minutes/Week): 120, Intensity: Mild, Exercise limited by: Other - see comments(outside activities and golfing)  Goals   None    Depression Screen PHQ 2/9 Scores 02/05/2019 04/18/2012  PHQ - 2 Score 0 0    Fall Risk Fall Risk  02/05/2019 10/15/2018 04/18/2012  Falls in the past year? 0 0 No  Injury with Fall? 0 - -  Risk for fall due to : Impaired mobility - -  Follow up Education provided;Falls prevention  discussed;Falls evaluation completed - -    Is the patient's home free of loose throw rugs in walkways, pet beds, electrical cords, etc?   yes      Grab bars in the bathroom? yes      Handrails on the stairs?   yes      Adequate lighting?   yes    Cognitive Function: no cognitive concerns at this time  Cognitive Testing  Alert? Yes         Normal Appearance? n/a Oriented to person? Yes           Place? Yes  Time? Yes  Recall of three objects? Yes  Can perform simple calculations? Yes  Displays appropriate judgment? Yes  Can read the correct time from a watch face? Yes   Screening Tests Health Maintenance  Topic Date Due  . COLONOSCOPY  01/04/1998  . PNA vac Low Risk Adult (1 of 2 - PCV13) 01/04/2013  . TETANUS/TDAP  09/02/2019  . INFLUENZA VACCINE  Completed  . Hepatitis C Screening  Completed    Qualifies for Shingles Vaccine? Shingrix completed   Cancer Screenings: Lung: Low Dose CT Chest recommended if Age 57-80 years, 30 pack-year currently smoking OR have quit w/in 15years. Patient does not qualify. Colorectal: due for colonoscopy but would like to discuss with provider and consider longer; does have a history of polyps with last colonoscopy with Dr. Watt Climes      Plan:  I have personally reviewed and addressed the Medicare Annual Wellness questionnaire and have noted the following in the patient's chart:  A. Medical and  social history B. Use of alcohol, tobacco or illicit drugs  C. Current medications and supplements D. Functional ability and status E.  Nutritional status F.  Physical activity G. Advance directives H. List of other physicians I.  Hospitalizations, surgeries, and ER visits in previous 12 months J.  [place] such as hearing and vision if needed, cognitive and depression L. Referrals, records requested, and appointments-  None   In addition, I have reviewed and discussed with patient certain preventive protocols, quality metrics, and best practice recommendations. A written personalized care plan for preventive services as well as general preventive health recommendations were provided to patient.   Signed,  Denman George, LPN  Nurse Health Advisor   Nurse Notes: Patient provided information of local orthopedic surgeons.  He states that he will self refer for right hip pain.

## 2019-02-05 NOTE — Progress Notes (Signed)
I have personally reviewed the Medicare Annual Wellness Visit and agree with the assessment and plan.  Algis Greenhouse. Jerline Pain, MD 02/05/2019 4:35 PM

## 2019-03-06 ENCOUNTER — Other Ambulatory Visit: Payer: Self-pay | Admitting: Family Medicine

## 2019-03-11 IMAGING — DX DG HIP (WITH OR WITHOUT PELVIS) 5+V BILAT
3 series · 3 of 3 positions shown · non-contrast
Comparison: None.

CLINICAL DATA: Chronic right inguinal pain.

EXAM:
DG HIP (WITH OR WITHOUT PELVIS) 5+V BILAT

[pelvis ap]
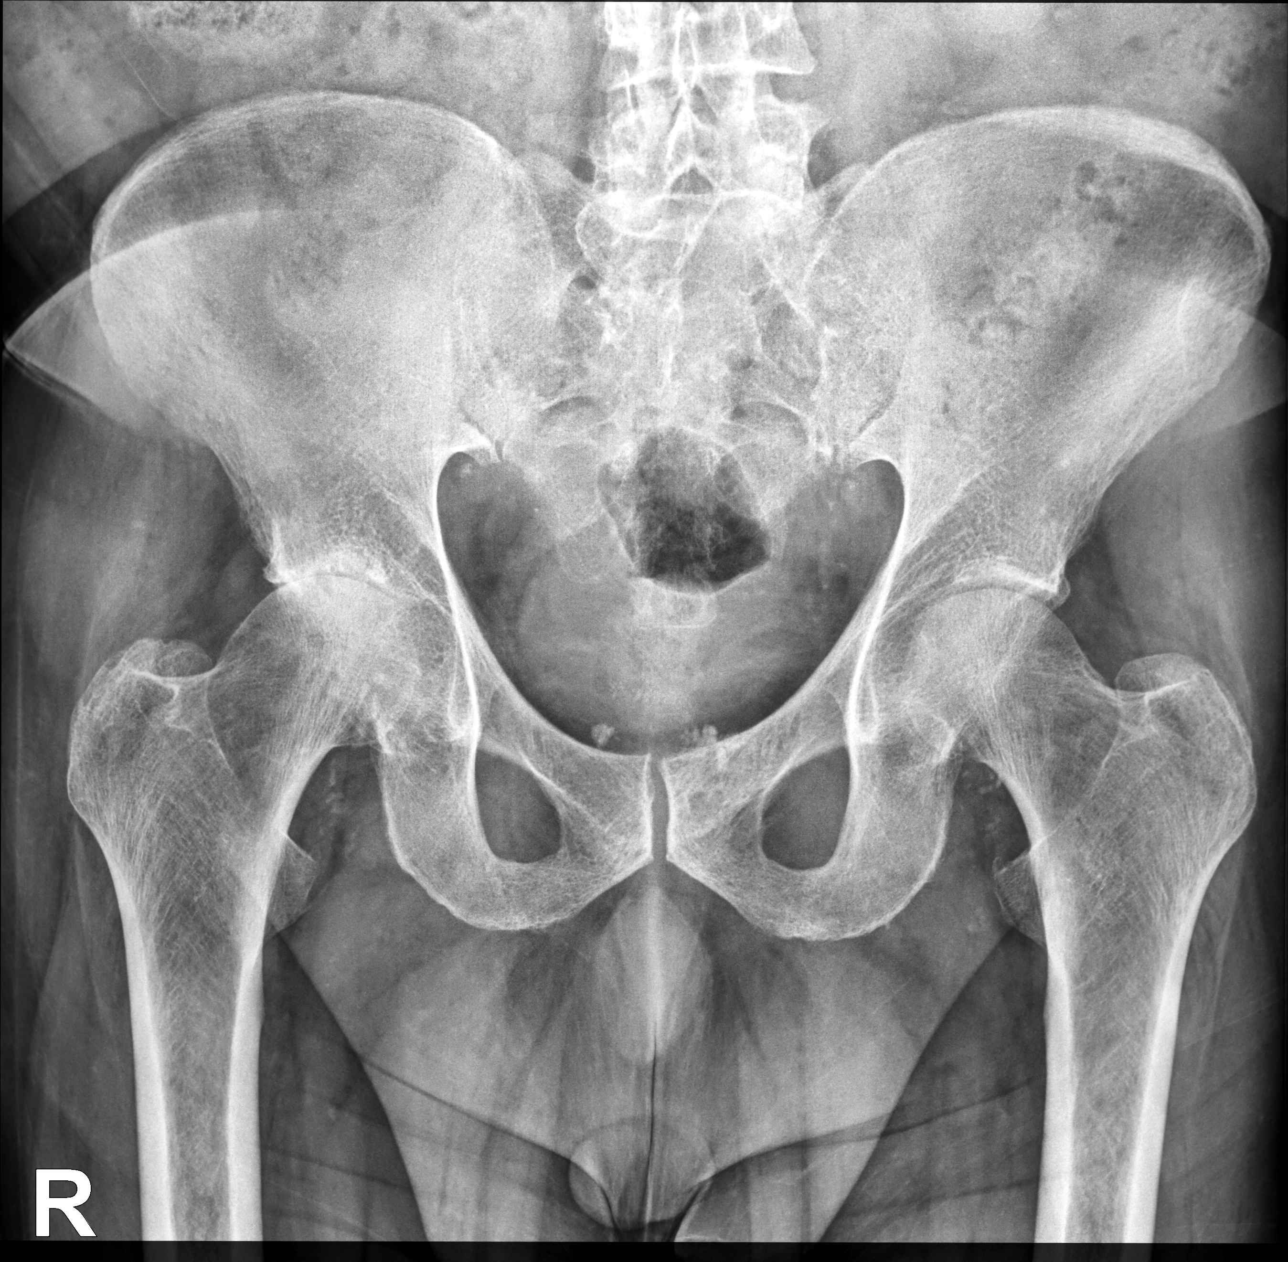

[hip joint (frog view) (1 of 2)]
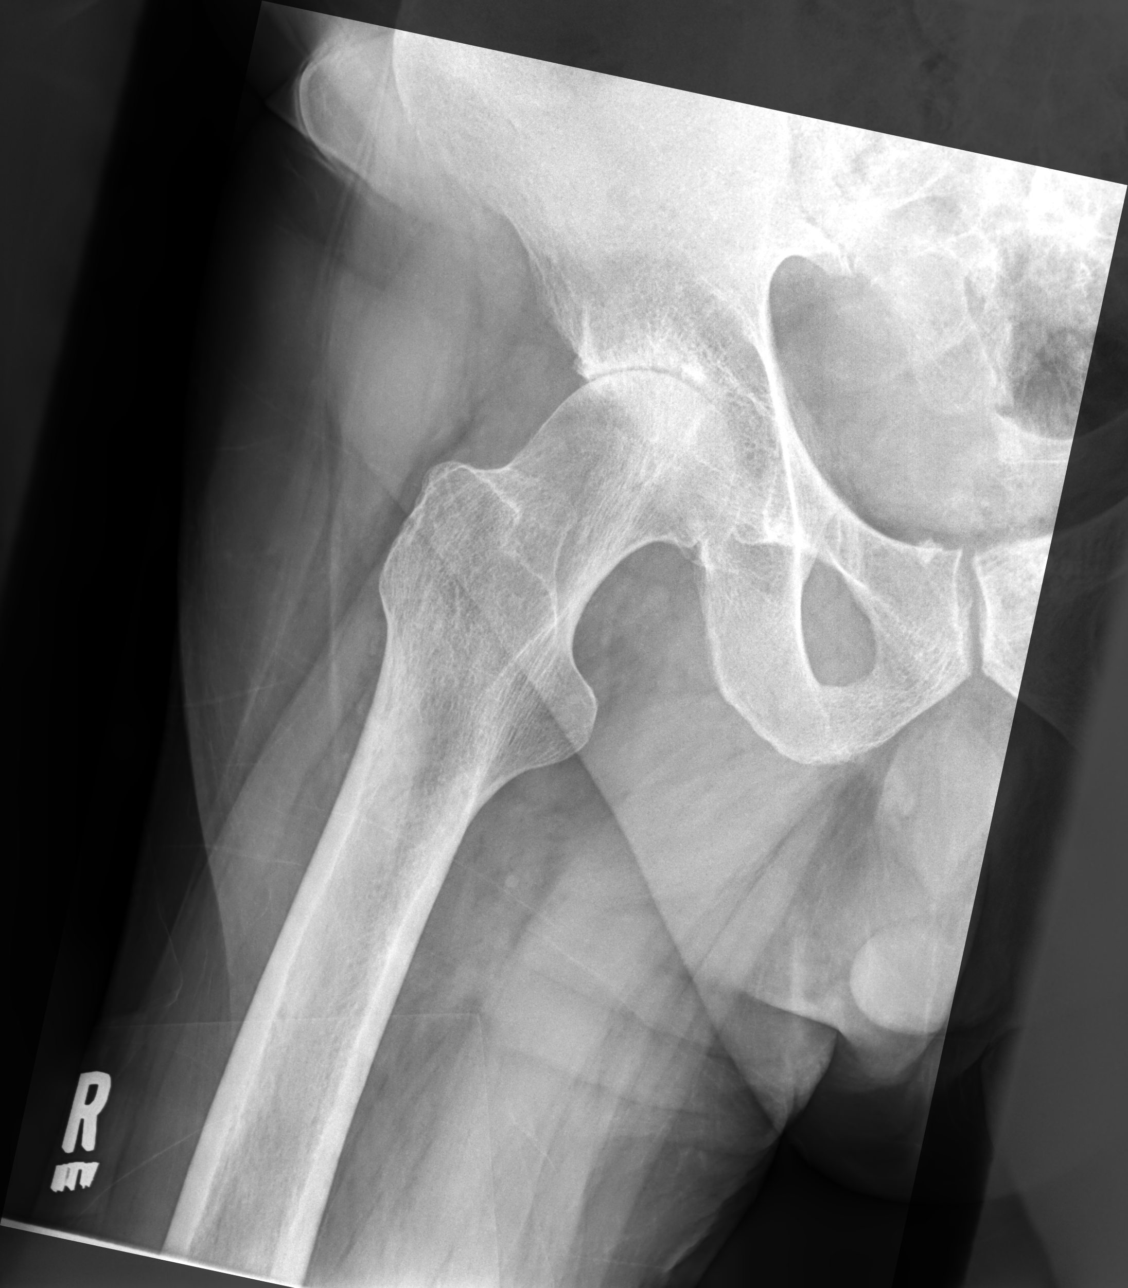

[hip joint (frog view) (2 of 2)]
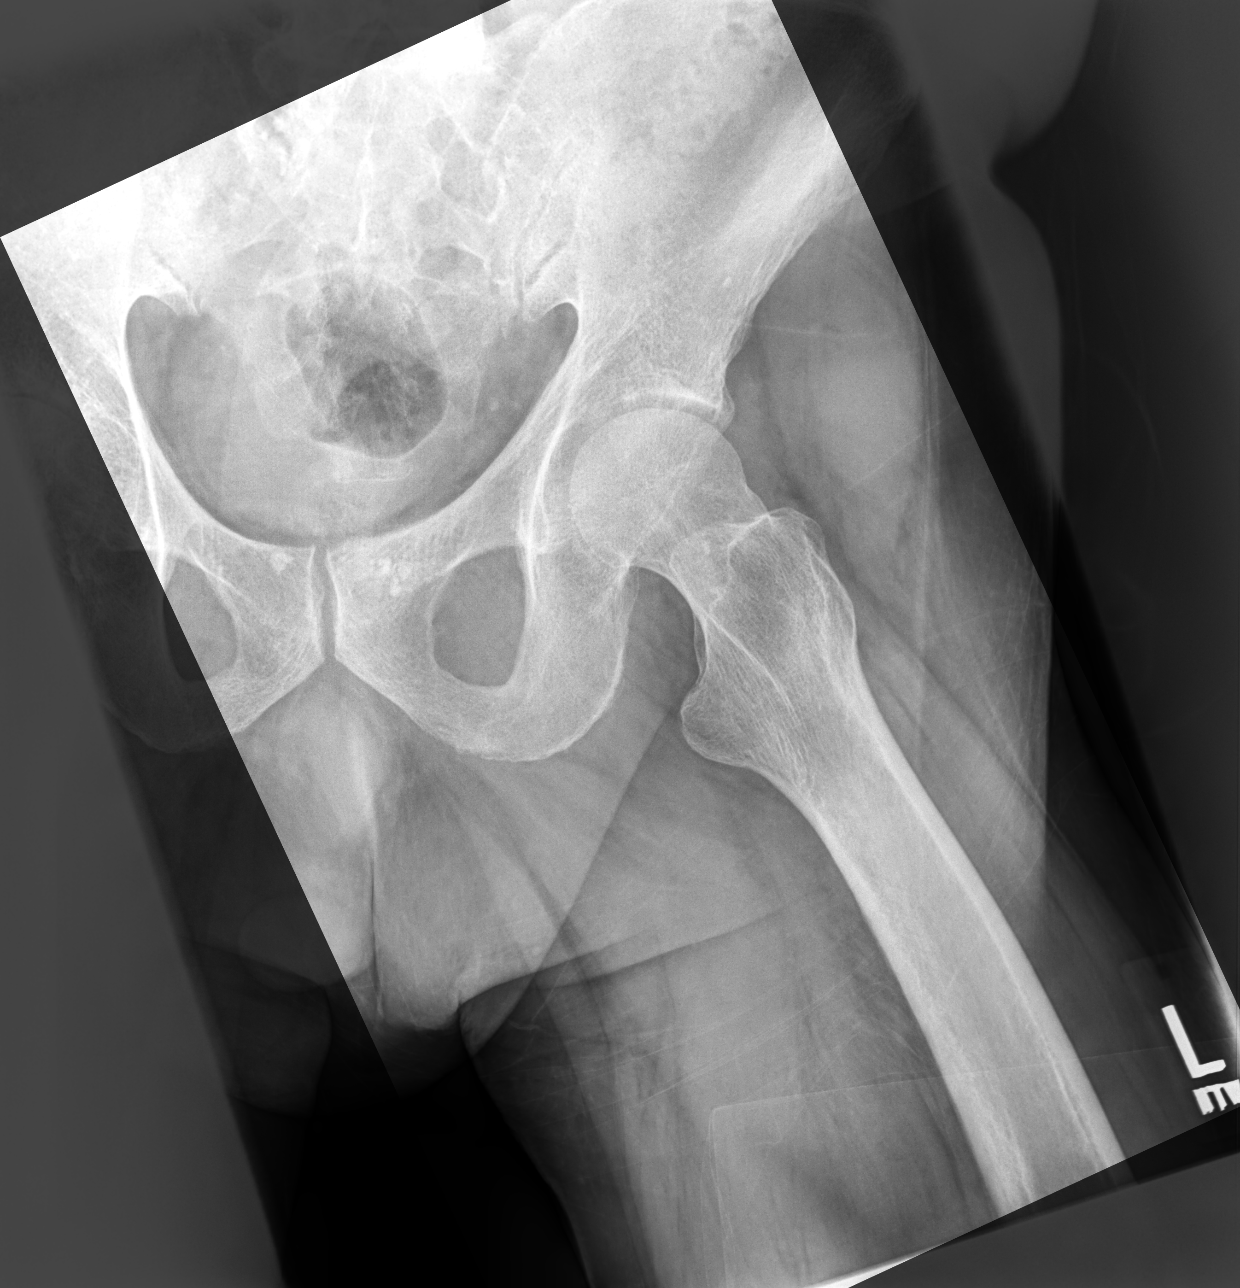

[3 of 3 positions shown; findings below may reference images not displayed]

FINDINGS: There is no evidence of hip fracture or dislocation. Severe
narrowing of superior portion of right hip joint is noted. Left hip
joint appears normal..
IMPRESSION: Severe degenerative joint disease of the right hip. No fracture or
dislocation is noted. Left hip is unremarkable.

## 2019-03-17 ENCOUNTER — Other Ambulatory Visit: Payer: Self-pay | Admitting: Family Medicine

## 2019-06-05 ENCOUNTER — Other Ambulatory Visit: Payer: Self-pay | Admitting: Family Medicine

## 2019-06-19 ENCOUNTER — Other Ambulatory Visit: Payer: Self-pay | Admitting: Family Medicine

## 2019-06-24 DIAGNOSIS — Z23 Encounter for immunization: Secondary | ICD-10-CM | POA: Diagnosis not present

## 2019-07-15 DIAGNOSIS — Z23 Encounter for immunization: Secondary | ICD-10-CM | POA: Diagnosis not present

## 2019-09-29 ENCOUNTER — Other Ambulatory Visit: Payer: Self-pay | Admitting: Family Medicine

## 2019-10-11 DIAGNOSIS — D485 Neoplasm of uncertain behavior of skin: Secondary | ICD-10-CM | POA: Diagnosis not present

## 2019-10-11 DIAGNOSIS — Z85828 Personal history of other malignant neoplasm of skin: Secondary | ICD-10-CM | POA: Diagnosis not present

## 2019-10-11 DIAGNOSIS — L821 Other seborrheic keratosis: Secondary | ICD-10-CM | POA: Diagnosis not present

## 2019-10-11 DIAGNOSIS — L720 Epidermal cyst: Secondary | ICD-10-CM | POA: Diagnosis not present

## 2019-10-11 DIAGNOSIS — L570 Actinic keratosis: Secondary | ICD-10-CM | POA: Diagnosis not present

## 2019-10-11 DIAGNOSIS — D224 Melanocytic nevi of scalp and neck: Secondary | ICD-10-CM | POA: Diagnosis not present

## 2019-10-11 DIAGNOSIS — L820 Inflamed seborrheic keratosis: Secondary | ICD-10-CM | POA: Diagnosis not present

## 2019-10-18 ENCOUNTER — Other Ambulatory Visit: Payer: Self-pay | Admitting: Family Medicine

## 2019-10-21 ENCOUNTER — Encounter: Payer: Medicare Other | Admitting: Family Medicine

## 2019-10-23 ENCOUNTER — Ambulatory Visit (INDEPENDENT_AMBULATORY_CARE_PROVIDER_SITE_OTHER): Payer: Medicare Other

## 2019-10-23 ENCOUNTER — Ambulatory Visit (INDEPENDENT_AMBULATORY_CARE_PROVIDER_SITE_OTHER): Payer: Medicare Other | Admitting: Orthopaedic Surgery

## 2019-10-23 VITALS — Ht 72.0 in | Wt 190.0 lb

## 2019-10-23 DIAGNOSIS — M25551 Pain in right hip: Secondary | ICD-10-CM

## 2019-10-23 DIAGNOSIS — M1611 Unilateral primary osteoarthritis, right hip: Secondary | ICD-10-CM

## 2019-10-23 DIAGNOSIS — M25559 Pain in unspecified hip: Secondary | ICD-10-CM

## 2019-10-23 NOTE — Progress Notes (Signed)
Office Visit Note   Patient: [name]           Date of Birth: 12/08/1945           MRN: [id number] Visit Date: 10/23/2019              Requested by: Vivi Barrack, MD [address] [place],  [place] [address] PCP: Vivi Barrack, MD   Assessment & Plan: Visit Diagnoses:  1. Hip pain   2. Unilateral primary osteoarthritis, right hip     Plan: At this point I agree with the need for hip replacement surgery of his right hip.  I have a long thorough discussion about hip replacement surgery.  I gave him a handout and described the risk and benefits of surgery as well as interoperative and postoperative course.  Given the failed conservative treatment over 12 months combined with the severity of his arthritis on clinical exam and x-ray exam, this surgery is warranted at this standpoint.  All questions and concerns were answered and addressed.  We will work on getting the surgery scheduled soon per his request.  Follow-Up Instructions: Return for 2 weeks post-op.   Orders:  Orders Placed This Encounter  Procedures  . XR HIP UNILAT W OR W/O PELVIS 1V RIGHT   No orders of the defined types were placed in this encounter.     Procedures: No procedures performed   Clinical Data: No additional findings.   Subjective: Chief Complaint  Patient presents with  . Right Hip - Pain  The patient is a very pleasant 74 year old gentleman who comes in for evaluation treatment of severe end-stage arthritis of his right hip that has been well-documented by several physicians.  His pain has become daily with his right hip and its in the groin.  It is at this point detrimentally affecting his mobility, his quality of life and his activities daily living.  He has trialed and failed conservative treatment for over 12 months now.  This includes steroid injections as well as anti-inflammatories, activity modification and rest.  At this point he is interested in hip replacement surgery.  He is  an active individual and is not on any blood thinners.  He is not a smoker and not a diabetic.  His wife is with him today as well.  HPI  Review of Systems He currently denies any headache, chest pain, shortness of breath, fever, chills, nausea, vomiting  Objective: Vital Signs: Ht 6' (1.829 m)   Wt 190 lb (86.2 kg)   BMI 25.77 kg/m   Physical Exam He is alert and orient x3 and in no acute distress Ortho Exam Examination of his left hip is normal.  Examination of his right hip shows severe pain with attempts of internal and external rotation.  There is significant stiffness with rotation and I cannot fully rotate the hip. Specialty Comments:  No specialty comments available.  Imaging: XR HIP UNILAT W OR W/O PELVIS 1V RIGHT  Result Date: 10/23/2019 A low AP pelvis lateral right hip shows severe end-stage arthritis of the right hip.  There is complete loss of the joint space.  There is particular osteophytes and sclerotic changes in the femoral head and acetabulum.  There is also cystic changes in the acetabular roof as well.  Compared to previous films this is worsened significantly over a year.    PMFS History: Patient Active Problem List   Diagnosis Date Noted  . Unilateral primary osteoarthritis, right hip 10/23/2019  .  Erectile dysfunction 10/15/2018  . Plantar wart 04/06/2018  . Essential tremor 04/06/2018  . Dyslipidemia 03/26/2018  . Essential hypertension 03/23/2018  . History of colon polyps 03/23/2018  . History of peptic ulcer 03/23/2018  . Low back pain 03/23/2018   Past Medical History:  Diagnosis Date  . COLONIC POLYPS, HX OF 09/13/2007   Qualifier: Diagnosis of  By: Linna Darner MD, Gwyndolyn Saxon   Dr Clarene Essex, GI Colonoscopy @ 5 year intervals    . DUODENAL ULCER, HX OF 09/13/2007   Qualifier: Diagnosis of  By: Linna Darner MD, Gwyndolyn Saxon    . GERD (gastroesophageal reflux disease)   . Hyperlipidemia   . SKIN CANCER, HX OF 09/01/2009   Qualifier: Diagnosis of  By: Linna Darner MD,  Fidela Juneau Cell  X 3-4; [facility]    . Ulcer 1989 & 2000   bleedng X2    Family History  Problem Relation Age of Onset  . Stroke Mother        late 37s  . Diabetes Brother        Type II  . Hypertension Brother   . Hyperlipidemia Brother   . Heart disease Neg Hx   . Cancer Neg Hx     Past Surgical History:  Procedure Laterality Date  . colonoscopy with polypectomy     Dr Watt Climes  . EYE SURGERY     post trauma with glss fragments  . INGUINAL HERNIA REPAIR  1970  . pilonidal cystectomy     Social History   Occupational History  . Occupation: Retired   Tobacco Use  . Smoking status: Former Smoker    Quit date: 03/15/2011    Years since quitting: 8.6  . Smokeless tobacco: Never Used  Substance and Sexual Activity  . Alcohol use: No  . Drug use: No  . Sexual activity: Not on file

## 2019-10-24 ENCOUNTER — Ambulatory Visit (INDEPENDENT_AMBULATORY_CARE_PROVIDER_SITE_OTHER): Payer: Medicare Other | Admitting: Family Medicine

## 2019-10-24 ENCOUNTER — Other Ambulatory Visit: Payer: Self-pay

## 2019-10-24 ENCOUNTER — Encounter: Payer: Self-pay | Admitting: Family Medicine

## 2019-10-24 VITALS — BP 124/76 | HR 85 | Temp 98.2°F | Ht 72.0 in | Wt 194.4 lb

## 2019-10-24 DIAGNOSIS — M1611 Unilateral primary osteoarthritis, right hip: Secondary | ICD-10-CM | POA: Diagnosis not present

## 2019-10-24 DIAGNOSIS — E785 Hyperlipidemia, unspecified: Secondary | ICD-10-CM | POA: Diagnosis not present

## 2019-10-24 DIAGNOSIS — N529 Male erectile dysfunction, unspecified: Secondary | ICD-10-CM | POA: Diagnosis not present

## 2019-10-24 DIAGNOSIS — R739 Hyperglycemia, unspecified: Secondary | ICD-10-CM | POA: Diagnosis not present

## 2019-10-24 DIAGNOSIS — I10 Essential (primary) hypertension: Secondary | ICD-10-CM

## 2019-10-24 MED ORDER — TADALAFIL 20 MG PO TABS: 20.0000 mg | ORAL_TABLET | Freq: Every day | ORAL | 3 refills | Status: DC | PRN

## 2019-10-24 NOTE — Assessment & Plan Note (Signed)
Continue lipitor 40mg  daily. Check lipid panel, CBC, CMET, TSH.

## 2019-10-24 NOTE — Assessment & Plan Note (Signed)
At goal.  Continue amlodipine 10 mg daily.  Check CBC CMET, and TSH.

## 2019-10-24 NOTE — Assessment & Plan Note (Signed)
Continue management per orthopedics. Will be getting hip replacement next month.

## 2019-10-24 NOTE — Assessment & Plan Note (Signed)
Will increase dose of cialis to 20mg  daily. Discussed potential side effects.

## 2019-10-24 NOTE — Progress Notes (Signed)
   [name] is a 74 y.o. male who presents today for an office visit.  Assessment/Plan:  Chronic Problems Addressed Today: Erectile dysfunction Will increase dose of cialis to 20mg  daily. Discussed potential side effects.   Dyslipidemia Continue lipitor 40mg  daily. Check lipid panel, CBC, CMET, TSH.   Essential hypertension At goal.  Continue amlodipine 10 mg daily.  Check CBC CMET, and TSH.  Hyperglycemia Check A1c.   Unilateral primary osteoarthritis, right hip Continue management per orthopedics. Will be getting hip replacement next month.   Preventative Healthcare Patient due for colonoscopy - this was discussed today. Will check labs.     Subjective:  HPI:  See A/p.         Objective:  Physical Exam: BP 124/76   Pulse 85   Temp 98.2 F (36.8 C)   Ht 6' (1.829 m)   Wt 194 lb 6.1 oz (88.2 kg)   SpO2 94%   BMI 26.36 kg/m   Wt Readings from Last 3 Encounters:  10/24/19 194 lb 6.1 oz (88.2 kg)  10/23/19 190 lb (86.2 kg)  10/15/18 200 lb (90.7 kg)   Gen: No acute distress, resting comfortably CV: Regular rate and rhythm with no murmurs appreciated Pulm: Normal work of breathing, clear to auscultation bilaterally with no crackles, wheezes, or rhonchi Neuro: Grossly normal, moves all extremities Psych: Normal affect and thought content  Time Spent: 45 minutes of total time was spent on the date of the encounter performing the following actions: chart review prior to seeing the patient including recent visit with orthopedics, obtaining history, performing a medically necessary exam, counseling on the treatment plan, placing orders, and documenting in our EHR.        Algis Greenhouse. Jerline Pain, MD 10/24/2019 2:14 PM

## 2019-10-24 NOTE — Patient Instructions (Signed)
It was very nice to see you today!  We will check blood work today.  I will send in a new dose of your Cialis.  You are due for your colonoscopy.  Please schedule this soon.  I will see you back in year for your next annual check up.  Please come back to me sooner if needed.  Take care, Dr Jerline Pain  Please try these tips to maintain a healthy lifestyle:   Eat at least 3 REAL meals and 1-2 snacks per day.  Aim for no more than 5 hours between eating.  If you eat breakfast, please do so within one hour of getting up.    Each meal should contain half fruits/vegetables, one quarter protein, and one quarter carbs (no bigger than a computer mouse)   Cut down on sweet beverages. This includes juice, soda, and sweet tea.     Drink at least 1 glass of water with each meal and aim for at least 8 glasses per day   Exercise at least 150 minutes every week.    Preventive Care 36 Years and Older, Male Preventive care refers to lifestyle choices and visits with your health care provider that can promote health and wellness. This includes:  A yearly physical exam. This is also called an annual well check.  Regular dental and eye exams.  Immunizations.  Screening for certain conditions.  Healthy lifestyle choices, such as diet and exercise. What can I expect for my preventive care visit? Physical exam Your health care provider will check:  Height and weight. These may be used to calculate body mass index (BMI), which is a measurement that tells if you are at a healthy weight.  Heart rate and blood pressure.  Your skin for abnormal spots. Counseling Your health care provider may ask you questions about:  Alcohol, tobacco, and drug use.  Emotional well-being.  Home and relationship well-being.  Sexual activity.  Eating habits.  History of falls.  Memory and ability to understand (cognition).  Work and work Statistician. What immunizations do I need?  Influenza (flu)  vaccine  This is recommended every year. Tetanus, diphtheria, and pertussis (Tdap) vaccine  You may need a Td booster every 10 years. Varicella (chickenpox) vaccine  You may need this vaccine if you have not already been vaccinated. Zoster (shingles) vaccine  You may need this after age 74. Pneumococcal conjugate (PCV13) vaccine  One dose is recommended after age 9. Pneumococcal polysaccharide (PPSV23) vaccine  One dose is recommended after age 24. Measles, mumps, and rubella (MMR) vaccine  You may need at least one dose of MMR if you were born in 1957 or later. You may also need a second dose. Meningococcal conjugate (MenACWY) vaccine  You may need this if you have certain conditions. Hepatitis A vaccine  You may need this if you have certain conditions or if you travel or work in places where you may be exposed to hepatitis A. Hepatitis B vaccine  You may need this if you have certain conditions or if you travel or work in places where you may be exposed to hepatitis B. Haemophilus influenzae type b (Hib) vaccine  You may need this if you have certain conditions. You may receive vaccines as individual doses or as more than one vaccine together in one shot (combination vaccines). Talk with your health care provider about the risks and benefits of combination vaccines. What tests do I need? Blood tests  Lipid and cholesterol levels. These may be checked  every 5 years, or more frequently depending on your overall health.  Hepatitis C test.  Hepatitis B test. Screening  Lung cancer screening. You may have this screening every year starting at age 75 if you have a 30-pack-year history of smoking and currently smoke or have quit within the past 15 years.  Colorectal cancer screening. All adults should have this screening starting at age 51 and continuing until age 5. Your health care provider may recommend screening at age 24 if you are at increased risk. You will have  tests every 1-10 years, depending on your results and the type of screening test.  Prostate cancer screening. Recommendations will vary depending on your family history and other risks.  Diabetes screening. This is done by checking your blood sugar (glucose) after you have not eaten for a while (fasting). You may have this done every 1-3 years.  Abdominal aortic aneurysm (AAA) screening. You may need this if you are a current or former smoker.  Sexually transmitted disease (STD) testing. Follow these instructions at home: Eating and drinking  Eat a diet that includes fresh fruits and vegetables, whole grains, lean protein, and low-fat dairy products. Limit your intake of foods with high amounts of sugar, saturated fats, and salt.  Take vitamin and mineral supplements as recommended by your health care provider.  Do not drink alcohol if your health care provider tells you not to drink.  If you drink alcohol: ? Limit how much you have to 0-2 drinks a day. ? Be aware of how much alcohol is in your drink. In the [place] one drink equals one 12 oz bottle of beer (355 mL), one 5 oz glass of wine (148 mL), or one 1 oz glass of hard liquor (44 mL). Lifestyle  Take daily care of your teeth and gums.  Stay active. Exercise for at least 30 minutes on 5 or more days each week.  Do not use any products that contain nicotine or tobacco, such as cigarettes, e-cigarettes, and chewing tobacco. If you need help quitting, ask your health care provider.  If you are sexually active, practice safe sex. Use a condom or other form of protection to prevent STIs (sexually transmitted infections).  Talk with your health care provider about taking a low-dose aspirin or statin. What's next?  Visit your health care provider once a year for a well check visit.  Ask your health care provider how often you should have your eyes and teeth checked.  Stay up to date on all vaccines. This information is not  intended to replace advice given to you by your health care provider. Make sure you discuss any questions you have with your health care provider. Document Revised: 02/22/2018 Document Reviewed: 02/22/2018 Elsevier Patient Education  2020 [employer].

## 2019-10-24 NOTE — Assessment & Plan Note (Signed)
Check A1c. 

## 2019-10-25 LAB — CBC
HCT: 47.8 % (ref 38.5–50.0)
Hemoglobin: 16.1 g/dL (ref 13.2–17.1)
MCH: 31.9 pg (ref 27.0–33.0)
MCHC: 33.7 g/dL (ref 32.0–36.0)
MCV: 94.8 fL (ref 80.0–100.0)
MPV: 10.8 fL (ref 7.5–12.5)
Platelets: 224 10*3/uL (ref 140–400)
RBC: 5.04 10*6/uL (ref 4.20–5.80)
RDW: 12.8 % (ref 11.0–15.0)
WBC: 8.5 10*3/uL (ref 3.8–10.8)

## 2019-10-25 LAB — COMPREHENSIVE METABOLIC PANEL
AG Ratio: 1.7 (calc) (ref 1.0–2.5)
ALT: 12 U/L (ref 9–46)
AST: 12 U/L (ref 10–35)
Albumin: 4.6 g/dL (ref 3.6–5.1)
Alkaline phosphatase (APISO): 99 U/L (ref 35–144)
BUN: 16 mg/dL (ref 7–25)
CO2: 29 mmol/L (ref 20–32)
Calcium: 11.8 mg/dL — ABNORMAL HIGH (ref 8.6–10.3)
Chloride: 106 mmol/L (ref 98–110)
Creat: 1.07 mg/dL (ref 0.70–1.18)
Globulin: 2.7 g/dL (calc) (ref 1.9–3.7)
Glucose, Bld: 101 mg/dL — ABNORMAL HIGH (ref 65–99)
Potassium: 4.2 mmol/L (ref 3.5–5.3)
Sodium: 141 mmol/L (ref 135–146)
Total Bilirubin: 0.8 mg/dL (ref 0.2–1.2)
Total Protein: 7.3 g/dL (ref 6.1–8.1)

## 2019-10-25 LAB — LIPID PANEL
Cholesterol: 145 mg/dL (ref <200)
HDL: 31 mg/dL — ABNORMAL LOW (ref >=40)
LDL Cholesterol (Calc): 83 mg/dL (calc)
Non-HDL Cholesterol (Calc): 114 mg/dL (calc) (ref <130)
Total CHOL/HDL Ratio: 4.7 (calc) (ref <5.0)
Triglycerides: 211 mg/dL — ABNORMAL HIGH (ref <150)

## 2019-10-25 LAB — HEMOGLOBIN A1C
Hgb A1c MFr Bld: 5.5 % of total Hgb (ref <5.7)
Mean Plasma Glucose: 111 (calc)
eAG (mmol/L): 6.2 (calc)

## 2019-10-25 LAB — TSH: TSH: 1.96 mIU/L (ref 0.40–4.50)

## 2019-10-26 NOTE — Progress Notes (Signed)
Please inform patient of the following:  Blood work is all stable.  Do not need to make any changes to medication or treatment plan at this time.  Would like for him to keep up the good work and we can recheck in year.

## 2019-11-06 ENCOUNTER — Telehealth: Payer: Self-pay | Admitting: Family Medicine

## 2019-11-06 ENCOUNTER — Other Ambulatory Visit: Payer: Self-pay

## 2019-11-06 MED ORDER — ATORVASTATIN CALCIUM 40 MG PO TABS: 40.0000 mg | ORAL_TABLET | Freq: Every day | ORAL | 3 refills | Status: DC

## 2019-11-06 NOTE — Telephone Encounter (Signed)
Rx sent in

## 2019-11-06 NOTE — Telephone Encounter (Signed)
MEDICATION: amLODipine (NORVASC) 10 MG tablet  PHARMACY:  [facility], [place] Phone:  [phone]  Fax:  [phone]       Comments: Patient would like 90 days sent in    **Let patient know to contact pharmacy at the end of the day to make sure medication is ready. **  ** Please notify patient to allow 48-72 hours to process**  **Encourage patient to contact the pharmacy for refills or they can request refills through [facility]**

## 2019-11-11 ENCOUNTER — Other Ambulatory Visit: Payer: Self-pay | Admitting: Family Medicine

## 2019-11-11 NOTE — Telephone Encounter (Signed)
Medication was NOT sent in.Pt is now down to 1 pill

## 2019-11-11 NOTE — Telephone Encounter (Signed)
Pt notified Rx send to [facility]

## 2019-11-25 ENCOUNTER — Other Ambulatory Visit: Payer: Self-pay | Admitting: Physician Assistant

## 2019-11-26 ENCOUNTER — Telehealth: Payer: Self-pay | Admitting: Orthopaedic Surgery

## 2019-11-26 NOTE — Telephone Encounter (Signed)
Patient called.   He is requesting a call back to clarify exactly which medical equipment he was told he needed.  Call back: [phone]

## 2019-11-27 ENCOUNTER — Telehealth: Payer: Self-pay | Admitting: Orthopaedic Surgery

## 2019-11-27 ENCOUNTER — Telehealth: Payer: Self-pay | Admitting: *Deleted

## 2019-11-27 NOTE — Telephone Encounter (Signed)
Ortho bundle pre-op call to review all surgery information completed.

## 2019-11-27 NOTE — Care Plan (Signed)
RNCM call to patient and his wife to discuss his upcoming R-THA with Dr. Ninfa Linden on 12/06/19. He is an Ortho bundle patient through [facility] and will also be a same day discharge. Reviewed all pre- and post-op questions and allowed patient to ask questions he has related to surgery. His wife will assist after surgery in his home. He has 3-4 steps to get into his home. He will need a FWW as well as 3in1. These have been ordered through [facility]. Anticipate HHPT will be needed after surgery. Choice provided and referral made to [facility]. Will continue to follow for all other needs.

## 2019-11-27 NOTE — Telephone Encounter (Signed)
Send this message to [facility] the Tourist information centre manager as well.  Usually we set them up for eye walker as well as a 3 and 1 and bedside commode if needed.  She can walk him through the details of that since he will be same-day surgery.

## 2019-11-27 NOTE — Telephone Encounter (Signed)
Patient requesting a called back. Patient states he has questions about surgery in a few days and would like a call back as soon as possible. Patient phone number is [phone].

## 2019-11-27 NOTE — Telephone Encounter (Signed)
He's on my list to call. I will get it handled.He is also a [facility] bundle patient, just FYI. Thanks.

## 2019-11-27 NOTE — Telephone Encounter (Signed)
Done. Called and went over all post-op instructions, DME, HH and everything with patient today. Thanks.

## 2019-11-28 NOTE — Progress Notes (Signed)
DUE TO COVID-19 ONLY ONE VISITOR IS ALLOWED TO COME WITH YOU AND STAY IN THE WAITING ROOM ONLY DURING PRE OP AND PROCEDURE DAY OF SURGERY. THE 1 VISITOR  MAY VISIT WITH YOU AFTER SURGERY IN YOUR PRIVATE ROOM DURING VISITING HOURS ONLY!  YOU NEED TO HAVE A COVID 19 TEST ON__9/21/2021 _____ @_______ , THIS TEST MUST BE DONE BEFORE SURGERY,  COVID TESTING SITE [address] [place] [address], IT IS ON THE RIGHT GOING OUT [address] APPROXIMATELY  2 MINUTES PAST [employer] ON THE RIGHT. ONCE YOUR COVID TEST IS COMPLETED,  PLEASE BEGIN THE QUARANTINE INSTRUCTIONS AS OUTLINED IN YOUR HANDOUT.                [name]  11/28/2019   Your procedure is scheduled on: 12/06/19    Report to [facility] Main  Entrance   Report to admitting     0515amAM     Call this number if you have problems the morning of surgery [phone]    REMEMBER: NO  SOLID FOOD CANDY OR GUM AFTER MIDNIGHT. CLEAR LIQUIDS UNTIL 0415am        . NOTHING BY MOUTH EXCEPT CLEAR LIQUIDS UNTIL    . PLEASE FINISH ENSURE DRINK PER SURGEON ORDER  WHICH NEEDS TO BE COMPLETED AT   0415am   .      CLEAR LIQUID DIET   Foods Allowed                                                                    Coffee and tea, regular and decaf                            Fruit ices (not with fruit pulp)                                      Iced Popsicles                                    Carbonated beverages, regular and diet                                    Cranberry, grape and apple juices Sports drinks like Gatorade Lightly seasoned clear broth or consume(fat free) Sugar, honey syrup ___________________________________________________________________      BRUSH YOUR TEETH MORNING OF SURGERY AND RINSE YOUR MOUTH OUT, NO CHEWING GUM CANDY OR MINTS.     Take these medicines the morning of surgery with A SIP OF WATER: amlodipine                                 You may not have any metal on your  body including hair pins and              piercings  Do not wear jewelry, make-up, lotions, powders or perfumes, deodorant  Do not wear nail polish on your fingernails.  Do not shave  48 hours prior to surgery.              Men may shave face and neck.   Do not bring valuables to the hospital. [place].  Contacts, dentures or bridgework may not be worn into surgery.  Leave suitcase in the car. After surgery it may be brought to your room.     Patients discharged the day of surgery will not be allowed to drive home. IF YOU ARE HAVING SURGERY AND GOING HOME THE SAME DAY, YOU MUST HAVE AN ADULT TO DRIVE YOU HOME AND BE WITH YOU FOR 24 HOURS. YOU MAY GO HOME BY TAXI OR UBER OR ORTHERWISE, BUT AN ADULT MUST ACCOMPANY YOU HOME AND STAY WITH YOU FOR 24 HOURS.  Name and phone number of your driver:  Special Instructions: N/A              Please read over the following fact sheets you were given: _____________________________________________________________________  [facility] - Preparing for Surgery Before surgery, you can play an important role.  Because skin is not sterile, your skin needs to be as free of germs as possible.  You can reduce the number of germs on your skin by washing with CHG (chlorahexidine gluconate) soap before surgery.  CHG is an antiseptic cleaner which kills germs and bonds with the skin to continue killing germs even after washing. Please DO NOT use if you have an allergy to CHG or antibacterial soaps.  If your skin becomes reddened/irritated stop using the CHG and inform your nurse when you arrive at Short Stay. Do not shave (including legs and underarms) for at least 48 hours prior to the first CHG shower.  You may shave your face/neck. Please follow these instructions carefully:  1.  Shower with CHG Soap the night before surgery and the  morning of Surgery.  2.  If you choose to wash your hair, wash your hair  first as usual with your  normal  shampoo.  3.  After you shampoo, rinse your hair and body thoroughly to remove the  shampoo.                           4.  Use CHG as you would any other liquid soap.  You can apply chg directly  to the skin and wash                       Gently with a scrungie or clean washcloth.  5.  Apply the CHG Soap to your body ONLY FROM THE NECK DOWN.   Do not use on face/ open                           Wound or open sores. Avoid contact with eyes, ears mouth and genitals (private parts).                       Wash face,  Genitals (private parts) with your normal soap.             6.  Wash thoroughly, paying special attention to the area where your surgery  will be performed.  7.  Thoroughly rinse your body with  warm water from the neck down.  8.  DO NOT shower/wash with your normal soap after using and rinsing off  the CHG Soap.                9.  Pat yourself dry with a clean towel.            10.  Wear clean pajamas.            11.  Place clean sheets on your bed the night of your first shower and do not  sleep with pets. Day of Surgery : Do not apply any lotions/deodorants the morning of surgery.  Please wear clean clothes to the hospital/surgery center.  FAILURE TO FOLLOW THESE INSTRUCTIONS MAY RESULT IN THE CANCELLATION OF YOUR SURGERY PATIENT SIGNATURE_________________________________  NURSE SIGNATURE__________________________________  ________________________________________________________________________

## 2019-11-29 ENCOUNTER — Encounter (HOSPITAL_COMMUNITY): Payer: Self-pay

## 2019-11-29 ENCOUNTER — Other Ambulatory Visit: Payer: Self-pay

## 2019-11-29 ENCOUNTER — Encounter (HOSPITAL_COMMUNITY)
Admission: RE | Admit: 2019-11-29 | Discharge: 2019-11-29 | Disposition: A | Discharge status: Home / Self Care | Payer: Medicare Other | Source: Ambulatory Visit | Attending: Orthopaedic Surgery | Admitting: Orthopaedic Surgery

## 2019-11-29 DIAGNOSIS — Z01810 Encounter for preprocedural cardiovascular examination: Secondary | ICD-10-CM | POA: Diagnosis not present

## 2019-11-29 DIAGNOSIS — I4510 Unspecified right bundle-branch block: Secondary | ICD-10-CM | POA: Diagnosis not present

## 2019-11-29 DIAGNOSIS — Z01812 Encounter for preprocedural laboratory examination: Secondary | ICD-10-CM | POA: Diagnosis not present

## 2019-11-29 HISTORY — DX: Unspecified osteoarthritis, unspecified site: M19.90

## 2019-11-29 HISTORY — DX: Essential (primary) hypertension: I10

## 2019-11-29 LAB — TYPE AND SCREEN
ABO/RH(D): O POS
Antibody Screen: NEGATIVE

## 2019-11-29 LAB — CBC
HCT: 47.8 % (ref 39.0–52.0)
Hemoglobin: 16 g/dL (ref 13.0–17.0)
MCH: 31.7 pg (ref 26.0–34.0)
MCHC: 33.5 g/dL (ref 30.0–36.0)
MCV: 94.8 fL (ref 80.0–100.0)
Platelets: 235 10*3/uL (ref 150–400)
RBC: 5.04 MIL/uL (ref 4.22–5.81)
RDW: 13.4 % (ref 11.5–15.5)
WBC: 8.5 10*3/uL (ref 4.0–10.5)
nRBC: 0 % (ref 0.0–0.2)

## 2019-11-29 LAB — BASIC METABOLIC PANEL
Anion gap: 10 (ref 5–15)
BUN: 12 mg/dL (ref 8–23)
CO2: 26 mmol/L (ref 22–32)
Calcium: 11.5 mg/dL — ABNORMAL HIGH (ref 8.9–10.3)
Chloride: 106 mmol/L (ref 98–111)
Creatinine, Ser: 1.04 mg/dL (ref 0.61–1.24)
GFR calc Af Amer: >60 mL/min (ref >60)
GFR calc non Af Amer: >60 mL/min (ref >60)
Glucose, Bld: 108 mg/dL — ABNORMAL HIGH (ref 70–99)
Potassium: 4.2 mmol/L (ref 3.5–5.1)
Sodium: 142 mmol/L (ref 135–145)

## 2019-11-29 LAB — SURGICAL PCR SCREEN
MRSA, PCR: NEGATIVE
Staphylococcus aureus: NEGATIVE

## 2019-12-03 ENCOUNTER — Ambulatory Visit: Payer: Medicare Other

## 2019-12-03 ENCOUNTER — Other Ambulatory Visit (HOSPITAL_COMMUNITY)
Admission: RE | Admit: 2019-12-03 | Discharge: 2019-12-03 | Disposition: A | Discharge status: Home / Self Care | Payer: Medicare Other | Source: Ambulatory Visit | Attending: Orthopaedic Surgery | Admitting: Orthopaedic Surgery

## 2019-12-03 DIAGNOSIS — Z01812 Encounter for preprocedural laboratory examination: Secondary | ICD-10-CM | POA: Diagnosis not present

## 2019-12-03 DIAGNOSIS — Z20822 Contact with and (suspected) exposure to covid-19: Secondary | ICD-10-CM | POA: Insufficient documentation

## 2019-12-03 LAB — SARS CORONAVIRUS 2 (TAT 6-24 HRS): SARS Coronavirus 2: NEGATIVE

## 2019-12-05 ENCOUNTER — Other Ambulatory Visit: Payer: Self-pay | Admitting: Orthopaedic Surgery

## 2019-12-05 MED ORDER — METHOCARBAMOL 500 MG PO TABS: 500.0000 mg | ORAL_TABLET | Freq: Four times a day (QID) | ORAL | 1 refills | Status: DC | PRN

## 2019-12-05 MED ORDER — ONDANSETRON 4 MG PO TBDP: 4.0000 mg | ORAL_TABLET | Freq: Three times a day (TID) | ORAL | 0 refills | Status: DC | PRN

## 2019-12-05 MED ORDER — OXYCODONE HCL 5 MG PO TABS: 5.0000 mg | ORAL_TABLET | ORAL | 0 refills | Status: DC | PRN

## 2019-12-05 MED ORDER — ASPIRIN 81 MG PO CHEW: 81.0000 mg | CHEWABLE_TABLET | Freq: Two times a day (BID) | ORAL | 0 refills | Status: DC

## 2019-12-05 NOTE — H&P (Signed)
TOTAL HIP ADMISSION H&P  Patient is admitted for right total hip arthroplasty.  Subjective:  Chief Complaint: right hip pain  HPI: [name], 74 y.o. male, has a history of pain and functional disability in the right hip(s) due to arthritis and patient has failed non-surgical conservative treatments for greater than 12 weeks to include NSAID's and/or analgesics, corticosteriod injections, flexibility and strengthening excercises, use of assistive devices and activity modification.  Onset of symptoms was gradual starting 3 years ago with gradually worsening course since that time.The patient noted no past surgery on the right hip(s).  Patient currently rates pain in the right hip at 9 out of 10 with activity. Patient has night pain, worsening of pain with activity and weight bearing, trendelenberg gait, pain that interfers with activities of daily living, pain with passive range of motion and crepitus. Patient has evidence of subchondral cysts, subchondral sclerosis, periarticular osteophytes and joint space narrowing by imaging studies. This condition presents safety issues increasing the risk of falls.  There is no current active infection.  Patient Active Problem List   Diagnosis Date Noted  . Hyperglycemia 10/24/2019  . Unilateral primary osteoarthritis, right hip 10/23/2019  . Erectile dysfunction 10/15/2018  . Plantar wart 04/06/2018  . Essential tremor 04/06/2018  . Dyslipidemia 03/26/2018  . Essential hypertension 03/23/2018  . History of colon polyps 03/23/2018  . History of peptic ulcer 03/23/2018  . Low back pain 03/23/2018   Past Medical History:  Diagnosis Date  . Arthritis   . COLONIC POLYPS, HX OF 09/13/2007   Qualifier: Diagnosis of  By: Linna Darner MD, Gwyndolyn Saxon   Dr Clarene Essex, GI Colonoscopy @ 5 year intervals    . DUODENAL ULCER, HX OF 09/13/2007   Qualifier: Diagnosis of  By: Linna Darner MD, Gwyndolyn Saxon    . GERD (gastroesophageal reflux disease)   . Hyperlipidemia   .  Hypertension   . SKIN CANCER, HX OF 09/01/2009   Qualifier: Diagnosis of  By: Linna Darner MD, Fidela Juneau Cell  X 3-4; [facility]    . Ulcer 1989 & 2000   bleedng X2    Past Surgical History:  Procedure Laterality Date  . colonoscopy with polypectomy     Dr Watt Climes  . EYE SURGERY     post trauma with glss fragments  . INGUINAL HERNIA REPAIR  1970  . pilonidal cystectomy      No current facility-administered medications for this encounter.   Current Outpatient Medications  Medication Sig Dispense Refill Last Dose  . amLODipine (NORVASC) 10 MG tablet Take 1 tablet by mouth once daily (Patient taking differently: Take 10 mg by mouth daily. ) 30 tablet 0   . Ascorbic Acid (VITAMIN C PO) Take 1,650 mg by mouth daily.     Marland Kitchen atorvastatin (LIPITOR) 40 MG tablet Take 1 tablet (40 mg total) by mouth daily. 90 tablet 3   . Cholecalciferol (VITAMIN D3) 125 MCG (5000 UT) TABS Take 5,000 Units by mouth daily.     Marland Kitchen QUERCETIN PO Take 500 mg by mouth daily.     . tadalafil (CIALIS) 20 MG tablet Take 1 tablet (20 mg total) by mouth daily as needed for erectile dysfunction. (Patient taking differently: Take 20 mg by mouth every other day. ) 90 tablet 3   . Zinc 50 MG CAPS Take 50 mg by mouth daily.     Marland Kitchen aspirin (ASPIRIN CHILDRENS) 81 MG chewable tablet Chew 1 tablet (81 mg total) by mouth 2 (two) times daily  after a meal. 30 tablet 0   . methocarbamol (ROBAXIN) 500 MG tablet Take 1 tablet (500 mg total) by mouth every 6 (six) hours as needed for muscle spasms. 40 tablet 1   . ondansetron (ZOFRAN ODT) 4 MG disintegrating tablet Take 1 tablet (4 mg total) by mouth every 8 (eight) hours as needed for nausea or vomiting. 20 tablet 0   . oxyCODONE (ROXICODONE) 5 MG immediate release tablet Take 1 tablet (5 mg total) by mouth every 4 (four) hours as needed for severe pain. 30 tablet 0    Allergies  Allergen Reactions  . Ibuprofen     Hx of bleeding ulcer  . Choline Fenofibrate Other (See Comments)     ABDOMINAL PAIN, CONSTIPATION    Social History   Tobacco Use  . Smoking status: Former Smoker    Quit date: 03/15/2011    Years since quitting: 8.7  . Smokeless tobacco: Former Systems developer    Types: Chew  Substance Use Topics  . Alcohol use: No    Family History  Problem Relation Age of Onset  . Stroke Mother        late 67s  . Diabetes Brother        Type II  . Hypertension Brother   . Hyperlipidemia Brother   . Heart disease Neg Hx   . Cancer Neg Hx      Review of Systems  Musculoskeletal: Positive for gait problem.  All other systems reviewed and are negative.   Objective:  Physical Exam Vitals reviewed.  Constitutional:      Appearance: Normal appearance.  HENT:     Head: Normocephalic and atraumatic.  Eyes:     Extraocular Movements: Extraocular movements intact.     Pupils: Pupils are equal, round, and reactive to light.  Cardiovascular:     Rate and Rhythm: Normal rate.  Pulmonary:     Effort: Pulmonary effort is normal.     Breath sounds: Normal breath sounds.  Abdominal:     Palpations: Abdomen is soft.  Musculoskeletal:     Cervical back: Normal range of motion and neck supple.     Right hip: Tenderness and bony tenderness present. Decreased range of motion. Decreased strength.  Neurological:     Mental Status: He is alert and oriented to person, place, and time.  Psychiatric:        Behavior: Behavior normal.     Vital signs in last 24 hours:    Labs:   Estimated body mass index is 26.04 kg/m as calculated from the following:   Height as of 11/29/19: 6' (1.829 m).   Weight as of 11/29/19: 87.1 kg.   Imaging Review Plain radiographs demonstrate severe degenerative joint disease of the right hip(s). The bone quality appears to be good for age and reported activity level.      Assessment/Plan:  End stage arthritis, right hip(s)  The patient history, physical examination, clinical judgement of the provider and imaging studies are consistent  with end stage degenerative joint disease of the right hip(s) and total hip arthroplasty is deemed medically necessary. The treatment options including medical management, injection therapy, arthroscopy and arthroplasty were discussed at length. The risks and benefits of total hip arthroplasty were presented and reviewed. The risks due to aseptic loosening, infection, stiffness, dislocation/subluxation,  thromboembolic complications and other imponderables were discussed.  The patient acknowledged the explanation, agreed to proceed with the plan and consent was signed. Patient is being admitted for inpatient treatment for surgery,  pain control, PT, OT, prophylactic antibiotics, VTE prophylaxis, progressive ambulation and ADL's and discharge planning.The patient is planning to be discharged home with home health services

## 2019-12-06 ENCOUNTER — Ambulatory Visit (HOSPITAL_COMMUNITY)
Admission: RE | Admit: 2019-12-06 | Discharge: 2019-12-06 | Disposition: A | Discharge status: Home / Self Care | Payer: Medicare Other | Attending: Orthopaedic Surgery | Admitting: Orthopaedic Surgery

## 2019-12-06 ENCOUNTER — Ambulatory Visit (HOSPITAL_COMMUNITY): Payer: Medicare Other | Admitting: Certified Registered Nurse Anesthetist

## 2019-12-06 ENCOUNTER — Encounter (HOSPITAL_COMMUNITY)
Admission: RE | Disposition: A | Discharge status: Home / Self Care | Payer: Self-pay | Source: Home / Self Care | Attending: Orthopaedic Surgery

## 2019-12-06 ENCOUNTER — Encounter (HOSPITAL_COMMUNITY): Payer: Self-pay | Admitting: Orthopaedic Surgery

## 2019-12-06 ENCOUNTER — Ambulatory Visit (HOSPITAL_COMMUNITY): Payer: Medicare Other

## 2019-12-06 DIAGNOSIS — Z87891 Personal history of nicotine dependence: Secondary | ICD-10-CM | POA: Diagnosis not present

## 2019-12-06 DIAGNOSIS — Z8249 Family history of ischemic heart disease and other diseases of the circulatory system: Secondary | ICD-10-CM | POA: Insufficient documentation

## 2019-12-06 DIAGNOSIS — Z85828 Personal history of other malignant neoplasm of skin: Secondary | ICD-10-CM | POA: Diagnosis not present

## 2019-12-06 DIAGNOSIS — Z886 Allergy status to analgesic agent status: Secondary | ICD-10-CM | POA: Insufficient documentation

## 2019-12-06 DIAGNOSIS — Z79899 Other long term (current) drug therapy: Secondary | ICD-10-CM | POA: Insufficient documentation

## 2019-12-06 DIAGNOSIS — Z7982 Long term (current) use of aspirin: Secondary | ICD-10-CM | POA: Diagnosis not present

## 2019-12-06 DIAGNOSIS — Z96641 Presence of right artificial hip joint: Secondary | ICD-10-CM | POA: Diagnosis not present

## 2019-12-06 DIAGNOSIS — E785 Hyperlipidemia, unspecified: Secondary | ICD-10-CM | POA: Diagnosis not present

## 2019-12-06 DIAGNOSIS — M1611 Unilateral primary osteoarthritis, right hip: Secondary | ICD-10-CM | POA: Diagnosis present

## 2019-12-06 DIAGNOSIS — N529 Male erectile dysfunction, unspecified: Secondary | ICD-10-CM | POA: Diagnosis not present

## 2019-12-06 DIAGNOSIS — I10 Essential (primary) hypertension: Secondary | ICD-10-CM | POA: Insufficient documentation

## 2019-12-06 DIAGNOSIS — Z471 Aftercare following joint replacement surgery: Secondary | ICD-10-CM | POA: Diagnosis not present

## 2019-12-06 DIAGNOSIS — Z86010 Personal history of colonic polyps: Secondary | ICD-10-CM | POA: Diagnosis not present

## 2019-12-06 DIAGNOSIS — K219 Gastro-esophageal reflux disease without esophagitis: Secondary | ICD-10-CM | POA: Insufficient documentation

## 2019-12-06 DIAGNOSIS — Z419 Encounter for procedure for purposes other than remedying health state, unspecified: Secondary | ICD-10-CM

## 2019-12-06 HISTORY — PX: TOTAL HIP ARTHROPLASTY: SHX124

## 2019-12-06 SURGERY — ARTHROPLASTY, HIP, TOTAL, ANTERIOR APPROACH
Anesthesia: Spinal | Site: Hip | Laterality: Right

## 2019-12-06 MED ORDER — STERILE WATER FOR IRRIGATION IR SOLN
Status: DC | PRN
  Administered 2019-12-06: 2000 mL

## 2019-12-06 MED ORDER — METHOCARBAMOL 500 MG IVPB - SIMPLE MED: 500.0000 mg | Freq: Four times a day (QID) | INTRAVENOUS | Status: DC | PRN

## 2019-12-06 MED ORDER — METHOCARBAMOL 500 MG IVPB - SIMPLE MED
INTRAVENOUS | Status: AC
  Administered 2019-12-06: 500 mg via INTRAVENOUS
  Filled 2019-12-06: qty 50

## 2019-12-06 MED ORDER — ORAL CARE MOUTH RINSE: 15.0000 mL | Freq: Once | OROMUCOSAL | Status: AC

## 2019-12-06 MED ORDER — SODIUM CHLORIDE 0.9 % IR SOLN
Status: DC | PRN
  Administered 2019-12-06: 1000 mL

## 2019-12-06 MED ORDER — OXYCODONE HCL 5 MG PO TABS: 10.0000 mg | ORAL_TABLET | ORAL | Status: DC | PRN

## 2019-12-06 MED ORDER — CHLORHEXIDINE GLUCONATE 0.12 % MT SOLN
15.0000 mL | Freq: Once | OROMUCOSAL | Status: AC
  Administered 2019-12-06: 15 mL via OROMUCOSAL

## 2019-12-06 MED ORDER — PROPOFOL 10 MG/ML IV BOLUS
INTRAVENOUS | Status: AC
  Filled 2019-12-06: qty 20

## 2019-12-06 MED ORDER — ONDANSETRON HCL 4 MG/2ML IJ SOLN
INTRAMUSCULAR | Status: DC | PRN
  Administered 2019-12-06: 4 mg via INTRAVENOUS

## 2019-12-06 MED ORDER — OXYCODONE HCL 5 MG PO TABS
5.0000 mg | ORAL_TABLET | ORAL | Status: DC | PRN
  Administered 2019-12-06: 5 mg via ORAL

## 2019-12-06 MED ORDER — PROPOFOL 10 MG/ML IV BOLUS
INTRAVENOUS | Status: DC | PRN
  Administered 2019-12-06 (×5): 10 mg via INTRAVENOUS
  Administered 2019-12-06 (×2): 20 mg via INTRAVENOUS

## 2019-12-06 MED ORDER — BUPIVACAINE IN DEXTROSE 0.75-8.25 % IT SOLN
INTRATHECAL | Status: DC | PRN
  Administered 2019-12-06: 1.6 mL via INTRATHECAL

## 2019-12-06 MED ORDER — ONDANSETRON HCL 4 MG/2ML IJ SOLN
INTRAMUSCULAR | Status: AC
  Filled 2019-12-06: qty 2

## 2019-12-06 MED ORDER — PROPOFOL 500 MG/50ML IV EMUL
INTRAVENOUS | Status: DC | PRN
  Administered 2019-12-06: 50 ug/kg/min via INTRAVENOUS

## 2019-12-06 MED ORDER — FENTANYL CITRATE (PF) 100 MCG/2ML IJ SOLN
INTRAMUSCULAR | Status: DC | PRN
Start: 2019-12-06 — End: 2019-12-06
  Administered 2019-12-06: 50 ug via INTRAVENOUS

## 2019-12-06 MED ORDER — FENTANYL CITRATE (PF) 100 MCG/2ML IJ SOLN
INTRAMUSCULAR | Status: AC
  Filled 2019-12-06: qty 2

## 2019-12-06 MED ORDER — TRANEXAMIC ACID-NACL 1000-0.7 MG/100ML-% IV SOLN
1000.0000 mg | INTRAVENOUS | Status: AC
  Administered 2019-12-06: 1000 mg via INTRAVENOUS
  Filled 2019-12-06: qty 100

## 2019-12-06 MED ORDER — LACTATED RINGERS IV SOLN: INTRAVENOUS | Status: DC

## 2019-12-06 MED ORDER — PROPOFOL 500 MG/50ML IV EMUL
INTRAVENOUS | Status: AC
  Filled 2019-12-06: qty 50

## 2019-12-06 MED ORDER — PHENYLEPHRINE HCL (PRESSORS) 10 MG/ML IV SOLN
INTRAVENOUS | Status: AC
  Filled 2019-12-06: qty 1

## 2019-12-06 MED ORDER — POVIDONE-IODINE 10 % EX SWAB
2.0000 "application " | Freq: Once | CUTANEOUS | Status: AC
  Administered 2019-12-06: 2 via TOPICAL

## 2019-12-06 MED ORDER — 0.9 % SODIUM CHLORIDE (POUR BTL) OPTIME
TOPICAL | Status: DC | PRN
  Administered 2019-12-06: 1000 mL

## 2019-12-06 MED ORDER — OXYCODONE HCL 5 MG PO TABS
ORAL_TABLET | ORAL | Status: DC
Start: 2019-12-06 — End: 2019-12-06
  Filled 2019-12-06: qty 1

## 2019-12-06 MED ORDER — CEFAZOLIN SODIUM-DEXTROSE 2-4 GM/100ML-% IV SOLN
2.0000 g | INTRAVENOUS | Status: AC
  Administered 2019-12-06: 2 g via INTRAVENOUS
  Filled 2019-12-06: qty 100

## 2019-12-06 MED ORDER — LACTATED RINGERS IV BOLUS
250.0000 mL | Freq: Once | INTRAVENOUS | Status: AC
  Administered 2019-12-06: 250 mL via INTRAVENOUS

## 2019-12-06 MED ORDER — PHENYLEPHRINE HCL-NACL 10-0.9 MG/250ML-% IV SOLN
INTRAVENOUS | Status: DC | PRN
  Administered 2019-12-06: 10 ug/min via INTRAVENOUS

## 2019-12-06 MED ORDER — DEXAMETHASONE SODIUM PHOSPHATE 10 MG/ML IJ SOLN
INTRAMUSCULAR | Status: DC | PRN
  Administered 2019-12-06: 8 mg via INTRAVENOUS

## 2019-12-06 MED ORDER — CEFAZOLIN SODIUM-DEXTROSE 1-4 GM/50ML-% IV SOLN: 1.0000 g | Freq: Once | INTRAVENOUS | Status: DC

## 2019-12-06 MED ORDER — METHOCARBAMOL 500 MG PO TABS: 500.0000 mg | ORAL_TABLET | Freq: Four times a day (QID) | ORAL | Status: DC | PRN

## 2019-12-06 MED ORDER — LACTATED RINGERS IV BOLUS
500.0000 mL | Freq: Once | INTRAVENOUS | Status: AC
  Administered 2019-12-06: 500 mL via INTRAVENOUS

## 2019-12-06 MED ORDER — HYDROMORPHONE HCL 1 MG/ML IJ SOLN: 0.2500 mg | INTRAMUSCULAR | Status: DC | PRN

## 2019-12-06 SURGICAL SUPPLY — 47 items
ACETAB CUP W GRIPTION 54MM (Plate) ×1 IMPLANT
ACETAB CUP W/GRIPTION 54 (Plate) ×2 IMPLANT
APL SKNCLS STERI-STRIP NONHPOA (GAUZE/BANDAGES/DRESSINGS)
BAG SPEC THK2 15X12 ZIP CLS (MISCELLANEOUS)
BAG ZIPLOCK 12X15 (MISCELLANEOUS) IMPLANT
BENZOIN TINCTURE PRP APPL 2/3 (GAUZE/BANDAGES/DRESSINGS) IMPLANT
BLADE SAW SGTL 18X1.27X75 (BLADE) ×2 IMPLANT
BLADE SAW SGTL 18X1.27X75MM (BLADE) ×1
CLOSURE WOUND 1/2 X4 (GAUZE/BANDAGES/DRESSINGS)
COVER PERINEAL POST (MISCELLANEOUS) ×3 IMPLANT
COVER SURGICAL LIGHT HANDLE (MISCELLANEOUS) ×3 IMPLANT
COVER WAND RF STERILE (DRAPES) ×3 IMPLANT
CUP ACETAB W/GRIPTION 54 (Plate) IMPLANT
DRAPE STERI IOBAN 125X83 (DRAPES) ×3 IMPLANT
DRAPE U-SHAPE 47X51 STRL (DRAPES) ×6 IMPLANT
DRESSING AQUACEL AG SP 3.5X10 (GAUZE/BANDAGES/DRESSINGS) IMPLANT
DRSG AQUACEL AG ADV 3.5X10 (GAUZE/BANDAGES/DRESSINGS) ×3 IMPLANT
DRSG AQUACEL AG SP 3.5X10 (GAUZE/BANDAGES/DRESSINGS) ×3
DRSG CURAD 3X16 NADH (PACKING) ×2 IMPLANT
DURAPREP 26ML APPLICATOR (WOUND CARE) ×3 IMPLANT
ELECT REM PT RETURN 15FT ADLT (MISCELLANEOUS) ×3 IMPLANT
GAUZE XEROFORM 1X8 LF (GAUZE/BANDAGES/DRESSINGS) ×3 IMPLANT
GLOVE BIO SURGEON STRL SZ7.5 (GLOVE) ×3 IMPLANT
GLOVE BIOGEL PI IND STRL 8 (GLOVE) ×2 IMPLANT
GLOVE BIOGEL PI INDICATOR 8 (GLOVE) ×4
GLOVE ECLIPSE 8.0 STRL XLNG CF (GLOVE) ×3 IMPLANT
GOWN STRL REUS W/TWL XL LVL3 (GOWN DISPOSABLE) ×6 IMPLANT
HANDPIECE INTERPULSE COAX TIP (DISPOSABLE) ×3
HEAD M SROM 36MM PLUS 1.5 (Hips) IMPLANT
HOLDER FOLEY CATH W/STRAP (MISCELLANEOUS) ×3 IMPLANT
KIT TURNOVER KIT A (KITS) IMPLANT
LINER NEUTRAL 36ID 54OD (Liner) ×2 IMPLANT
PACK ANTERIOR HIP CUSTOM (KITS) ×3 IMPLANT
PENCIL SMOKE EVACUATOR (MISCELLANEOUS) IMPLANT
SET HNDPC FAN SPRY TIP SCT (DISPOSABLE) ×1 IMPLANT
SROM M HEAD 36MM PLUS 1.5 (Hips) ×3 IMPLANT
STAPLER VISISTAT 35W (STAPLE) IMPLANT
STEM CORAIL HC SZ14 135 (Stem) ×2 IMPLANT
STRIP CLOSURE SKIN 1/2X4 (GAUZE/BANDAGES/DRESSINGS) IMPLANT
SUT ETHIBOND NAB CT1 #1 30IN (SUTURE) ×3 IMPLANT
SUT ETHILON 2 0 PS N (SUTURE) IMPLANT
SUT MNCRL AB 4-0 PS2 18 (SUTURE) IMPLANT
SUT VIC AB 0 CT1 36 (SUTURE) ×3 IMPLANT
SUT VIC AB 1 CT1 36 (SUTURE) ×3 IMPLANT
SUT VIC AB 2-0 CT1 27 (SUTURE) ×6
SUT VIC AB 2-0 CT1 TAPERPNT 27 (SUTURE) ×2 IMPLANT
TRAY FOLEY MTR SLVR 16FR STAT (SET/KITS/TRAYS/PACK) IMPLANT

## 2019-12-06 NOTE — Op Note (Signed)
NAME: [name], [name] MEDICAL RECORD [id number] ACCOUNT [id number] DATE OF BIRTH:10/31/1945 FACILITY: [facility] LOCATION: [facility] PHYSICIAN:[place] Huberty Kerry Fort, MD  OPERATIVE REPORT  DATE OF PROCEDURE:  12/06/2019  PREOPERATIVE DIAGNOSIS:  Primary osteoarthritis and degenerative joint disease, right hip.  POSTOPERATIVE DIAGNOSIS:  Primary osteoarthritis and degenerative joint disease, right hip.  PROCEDURE:  Right total hip arthroplasty through direct anterior approach.  IMPLANTS:  DePuy Sector Gription acetabular component size 54, size 36+0 neutral polyethylene liner, size 14 Corail femoral component with high offset, size 36+1.5 metal hip ball.  SURGEON:  Lind Guest.  Ninfa Linden, MD  ASSISTANT:  Erskine Emery, PA-C.  ANESTHESIA:  Spinal.  ANTIBIOTICS:  Two g IV Ancef.  ESTIMATED BLOOD LOSS:  150 mL.  COMPLICATIONS:  None.  INDICATIONS:  The patient is a 74 year old active individual with severe debilitating arthritis involving his right hip.  At this point, he has tried and failed all forms of conservative treatment and his x-rays do show significant loss of the cartilage  and joint space.  His exam was also consistent with severe osteoarthritis of the right hip.  He has tried and failed all forms of conservative treatment and at this point wished to proceed with a total hip arthroplasty.  We had a long and thorough  discussion about the risk of acute blood loss anemia, nerve or vessel injury, fracture, infection, dislocation, DVT,  implant failure and skin and soft tissue issues.  We talked about the goals being decreased pain, improved mobility and overall improved  quality of life.  DESCRIPTION OF PROCEDURE:  After informed consent was obtained, the  appropriate right hip was marked.  He was brought to the operating room and sat up on a stretcher where spinal anesthesia was obtained.  He was laid supine on the stretcher.  I was able  to assess his leg lengths and  found them to be equal.  Foley catheter was placed.  Traction boots were placed on both his feet.  He was placed supine on the Hana fracture table with a perineal post in place and both legs in line skeletal traction device  with no traction applied.  His right operative hip was prepped and draped with DuraPrep and sterile drapes.  A time-out was called.  He was identified as correct patient, correct right hip.  I then made an incision just inferior and posterior to the  anterior superior iliac spine and carried this obliquely down the leg.  We dissected down to the tensor fascia lata muscle.  The tensor fascia was then divided longitudinally to proceed with direct anterior approach to the hip.  We identified and  cauterized circumflex vessels and identified the hip capsule, opened up the hip capsule in an L-type format finding a moderate joint effusion and periarticular osteophytes around the lateral femoral head and neck.  I placed Cobra retractors within the  joint capsule and made our femoral neck cut with an oscillating saw just proximal to the lesser trochanter and completed this with an osteotome.  We placed a corkscrew guide in the femoral head and removed the femoral head in its entirety and did find a  wide area devoid of cartilage.  I then placed a bent Hohmann over the medial acetabular rim and removed remnants of acetabular labrum and other debris.  I then began reaming under direct visualization from a size 44 reamer in stepwise increments up to a  size 53, with all reamers placed under direct visualization, and the last reamer also placed under  direct fluoroscopy so I could obtain our depth of reaming for inclination and anteversion.  I then placed the real DePuy Sector Gription acetabular  component size 54 and I went with a neutral polyethylene liner given his offset and leg lengths being equal preoperative.  Attention was then turned to the femur.  With the leg externally rotated to 120  degrees, extended and adducted, we were able to  place a Mueller retractor medially and a Hohman retractor behind the greater trochanter, released the lateral joint capsule and used a box-cutting osteotome to enter the femoral canal and a rongeur to lateralize, then began broaching using the Corail  broaching system going from a size 8 going all the way to a 14.  With a 14 in place, we trialed a standard offset femoral neck and we went with a 36 minus 2 hip ball based on thinking he had a higher neck cut.  We reduced this in the acetabulum and it  actually showed that we needed more offset and leg length.   We dislocated the hip and removed the trial components.  We went with the real Corail femoral component size 14 with a high offset component and then we went with the real 36+1.5 hip ball,  reduced this in the acetabulum and I was pleased with leg length, offset, range of motion and stability assessed radiographically and mechanically.  We then irrigated the soft tissue with normal saline solution using pulsatile lavage.  We closed the  joint capsule with interrupted #1 Ethibond suture, followed by closing the tensor fascia with #1 Vicryl,  0 Vicryl was used to close the deep tissue and 2-0 Vicryl was used to close the skin and subcutaneous tissue.  The skin was closed with staples.   Xeroform and Aquacel dressing was applied.  He was taken off the Hana table and taken to recovery room in stable condition with all final counts being correct and no complications noted.  Of note, Benita Stabile, PA-C, assisted during the entire case.  His  assistance was crucial for facilitating all aspects of this case.  VN/NUANCE  D:12/06/2019 T:12/06/2019 JOB:[id number]

## 2019-12-06 NOTE — Care Plan (Signed)
Ortho Bundle Case Management Note  Patient Details  Name: [name] MRN: [id number] Date of Birth: 1945/11/04  [facility] call to patient and his wife before surgery to discuss his upcoming R-THA with Dr. Ninfa Linden on 12/06/19. He is an Ortho bundle patient through [facility] and will also be a same day discharge. Reviewed all pre- and post-op questions and allowed patient to ask questions he has related to surgery. His wife will assist after surgery in his home. He has 3-4 steps to get into his home. He will need a FWW as well as 3in1. These have been ordered through [facility]. Anticipate HHPT will be needed after surgery. Choice provided and referral made to [facility]. Will continue to follow for all other needs. Please contact me with questions or concerns.                        DME Arranged:  3-N-1, Walker rolling DME Agency:  [employer]  HH Arranged:  PT [place] Agency:  [facility] (now [facility])  Additional Comments: Please contact me with any questions of if this plan should need to change.  Jamse Arn, RN, BSN, [employer]  [phone] 12/06/2019, 9:26 AM

## 2019-12-06 NOTE — Evaluation (Addendum)
Physical Therapy Evaluation Patient Details Name: [name] MRN: [id number] DOB: June 12, 1945 Today's Date: 12/06/2019   History of Present Illness  Pt s/p R THR  Clinical Impression  Pt s/p R THR and presents with decreased R LE strength/ROM and post op pain limiting functional mobility.  Pt currently mobilizing at min guard/sup level including ambulation with RW, bed mobility and up/down stairs.  Spouse present to review above and car transfers.  Multiple questions asked and answered.  Pt eager for dc home.    Follow Up Recommendations Home health PT    Equipment Recommendations  Rolling walker with 5" wheels;3in1 (PT)    Recommendations for Other Services       Precautions / Restrictions Precautions Precautions: Fall Restrictions Weight Bearing Restrictions: No      Mobility  Bed Mobility Overal bed mobility: Needs Assistance Bed Mobility: Supine to Sit     Supine to sit: Supervision     General bed mobility comments: min cues for sequence  Transfers Overall transfer level: Needs assistance Equipment used: Rolling walker (2 wheeled) Transfers: Sit to/from Stand Sit to Stand: Min guard;Supervision         General transfer comment: cues for LE management and use of UEs to self assist  Ambulation/Gait Ambulation/Gait assistance: Min guard;Supervision Gait Distance (Feet): 150 Feet Assistive device: Rolling walker (2 wheeled) Gait Pattern/deviations: Step-to pattern;Step-through pattern;Decreased step length - right;Decreased step length - left;Shuffle;Trunk flexed     General Gait Details: cues for posture, position from RW and initial sequence  Stairs Stairs: Yes Stairs assistance: Min guard Stair Management: One rail Left;Step to pattern;Forwards;With cane Number of Stairs: 5 General stair comments: cues for sequence and foot/cane placement  Wheelchair Mobility    Modified Rankin (Stroke Patients Only)       Balance Overall balance  assessment: Mild deficits observed, not formally tested                                           Pertinent Vitals/Pain Pain Assessment: 0-10 Pain Score: 4  Pain Location: R hip/thigh Pain Descriptors / Indicators: Aching;Burning Pain Intervention(s): Limited activity within patient's tolerance;Monitored during session;Premedicated before session;Ice applied    Home Living Family/patient expects to be discharged to:: Private residence Living Arrangements: Spouse/significant other Available Help at Discharge: Family Type of Home: House Home Access: Stairs to enter Entrance Stairs-Rails: Left Entrance Stairs-Number of Steps: 4 Home Layout: One level Home Equipment: [place] - single point      Prior Function Level of Independence: Independent               Hand Dominance        Extremity/Trunk Assessment   Upper Extremity Assessment Upper Extremity Assessment: Overall WFL for tasks assessed    Lower Extremity Assessment Lower Extremity Assessment: RLE deficits/detail    Cervical / Trunk Assessment Cervical / Trunk Assessment: Normal  Communication   Communication: No difficulties  Cognition Arousal/Alertness: Awake/alert Behavior During Therapy: WFL for tasks assessed/performed Overall Cognitive Status: Within Functional Limits for tasks assessed                                        General Comments      Exercises Total Joint Exercises Ankle Circles/Pumps: AROM;Both;15 reps;Supine   Assessment/Plan    PT  Assessment Patient needs continued PT services  PT Problem List Decreased strength;Decreased activity tolerance;Decreased balance;Decreased mobility;Decreased knowledge of use of DME;Pain       PT Treatment Interventions DME instruction;Gait training;Stair training;Functional mobility training;Therapeutic activities;Therapeutic exercise;Patient/family education    PT Goals (Current goals can be found in the Care  Plan section)  Acute Rehab PT Goals Patient Stated Goal: Golfing PT Goal Formulation: All assessment and education complete, DC therapy    Frequency Min 1X/week   Barriers to discharge        Co-evaluation               AM-PAC PT "6 Clicks" Mobility  Outcome Measure Help needed turning from your back to your side while in a flat bed without using bedrails?: A Little Help needed moving from lying on your back to sitting on the side of a flat bed without using bedrails?: A Little Help needed moving to and from a bed to a chair (including a wheelchair)?: A Little Help needed standing up from a chair using your arms (e.g., wheelchair or bedside chair)?: A Little Help needed to walk in hospital room?: A Little Help needed climbing 3-5 steps with a railing? : A Little 6 Click Score: 18    End of Session Equipment Utilized During Treatment: Gait belt Activity Tolerance: Patient tolerated treatment well Patient left: in chair;with call bell/phone within reach;with family/visitor present Nurse Communication: Mobility status PT Visit Diagnosis: Difficulty in walking, not elsewhere classified (R26.2)    Time: 6962-9528 PT Time Calculation (min) (ACUTE ONLY): 49 min   Charges:              [place] Pager [phone] Office [phone]   Memori Sammon 12/06/2019, 1:10 PM

## 2019-12-06 NOTE — Anesthesia Postprocedure Evaluation (Signed)
Anesthesia Post Note  Patient: [name]  Procedure(s) Performed: RIGHT TOTAL HIP ARTHROPLASTY ANTERIOR APPROACH (Right Hip)     Patient location during evaluation: PACU Anesthesia Type: Spinal Level of consciousness: oriented and awake and alert Pain management: pain level controlled Vital Signs Assessment: post-procedure vital signs reviewed and stable Respiratory status: spontaneous breathing, respiratory function stable and patient connected to nasal cannula oxygen Cardiovascular status: blood pressure returned to baseline and stable Postop Assessment: no headache, no backache and no apparent nausea or vomiting Anesthetic complications: no   No complications documented.  Last Vitals:  Vitals:   12/06/19 0915 12/06/19 0930  BP: 103/68 111/62  Pulse: 64 68  Resp: 18 14  Temp:    SpO2: 93% 94%    Last Pain:  Vitals:   12/06/19 0845  TempSrc:   PainSc: (P) 0-No pain                 [name] S

## 2019-12-06 NOTE — Interval H&P Note (Signed)
History and Physical Interval Note: The patient understands that he is here today for a right total hip arthroplasty to treat his right hip osteoarthritis.  There has been no interval change in his medical status.  See recent H&P.  The risk and benefits of surgery have been explained in detail and informed consent is obtained.  The right hip has been marked.  12/06/2019 7:01 AM  [name]  has presented today for surgery, with the diagnosis of osteoarthritis right hip.  The various methods of treatment have been discussed with the patient and family. After consideration of risks, benefits and other options for treatment, the patient has consented to  Procedure(s): RIGHT TOTAL HIP ARTHROPLASTY ANTERIOR APPROACH (Right) as a surgical intervention.  The patient's history has been reviewed, patient examined, no change in status, stable for surgery.  I have reviewed the patient's chart and labs.  Questions were answered to the patient's satisfaction.     Mcarthur Rossetti

## 2019-12-06 NOTE — Discharge Instructions (Signed)

## 2019-12-06 NOTE — Transfer of Care (Signed)
Immediate Anesthesia Transfer of Care Note  Patient: [name]  Procedure(s) Performed: RIGHT TOTAL HIP ARTHROPLASTY ANTERIOR APPROACH (Right Hip)  Patient Location: PACU  Anesthesia Type:Spinal  Level of Consciousness: drowsy and patient cooperative  Airway & Oxygen Therapy: Patient Spontanous Breathing and Patient connected to face mask oxygen  Post-op Assessment: Report given to RN and Post -op Vital signs reviewed and stable  Post vital signs: Reviewed and stable  Last Vitals:  Vitals Value Taken Time  BP 82/55 12/06/19 0827  Temp    Pulse 62 12/06/19 0829  Resp 21 12/06/19 0829  SpO2 98 % 12/06/19 0829  Vitals shown include unvalidated device data.  Last Pain:  Vitals:   12/06/19 0558  TempSrc: Oral         Complications: No complications documented.

## 2019-12-06 NOTE — Brief Op Note (Signed)
12/06/2019  8:14 AM  PATIENT:  [name]  74 y.o. male  PRE-OPERATIVE DIAGNOSIS:  osteoarthritis right hip  POST-OPERATIVE DIAGNOSIS:  osteoarthritis right hip  PROCEDURE:  Procedure(s): RIGHT TOTAL HIP ARTHROPLASTY ANTERIOR APPROACH (Right)  SURGEON:  Surgeon(s) and Role:    Mcarthur Rossetti, MD - Primary  PHYSICIAN ASSISTANT:  Benita Stabile, PA-C  ANESTHESIA:   spinal  EBL:  150 mL   COUNTS:  YES  DICTATION: .Other Dictation: Dictation Number [id number]  PLAN OF CARE: Discharge to home after PACU  PATIENT DISPOSITION:  PACU - hemodynamically stable.   Delay start of Pharmacological VTE agent (>24hrs) due to surgical blood loss or risk of bleeding: no

## 2019-12-06 NOTE — Anesthesia Preprocedure Evaluation (Signed)
Anesthesia Evaluation  Patient identified by MRN, date of birth, ID band Patient awake    Reviewed: Allergy & Precautions, NPO status , Patient's Chart, lab work & pertinent test results  Airway Mallampati: II  TM Distance: >3 FB Neck ROM: Full    Dental no notable dental hx.    Pulmonary neg pulmonary ROS, former smoker,    Pulmonary exam normal breath sounds clear to auscultation       Cardiovascular hypertension, Pt. on medications Normal cardiovascular exam Rhythm:Regular Rate:Normal     Neuro/Psych negative neurological ROS  negative psych ROS   GI/Hepatic negative GI ROS, Neg liver ROS,   Endo/Other  negative endocrine ROS  Renal/GU negative Renal ROS  negative genitourinary   Musculoskeletal  (+) Arthritis , Osteoarthritis,    Abdominal   Peds negative pediatric ROS (+)  Hematology negative hematology ROS (+)   Anesthesia Other Findings   Reproductive/Obstetrics negative OB ROS                             Anesthesia Physical Anesthesia Plan  ASA: II  Anesthesia Plan: Spinal   Post-op Pain Management:    Induction: Intravenous  PONV Risk Score and Plan: 2 and Ondansetron and Dexamethasone  Airway Management Planned: Simple Face Mask  Additional Equipment:   Intra-op Plan:   Post-operative Plan:   Informed Consent: I have reviewed the patients History and Physical, chart, labs and discussed the procedure including the risks, benefits and alternatives for the proposed anesthesia with the patient or authorized representative who has indicated his/her understanding and acceptance.     Dental advisory given  Plan Discussed with: CRNA and Surgeon  Anesthesia Plan Comments:         Anesthesia Quick Evaluation

## 2019-12-06 NOTE — Anesthesia Procedure Notes (Signed)
Spinal  Patient location during procedure: OR Start time: 12/06/2019 7:10 AM End time: 12/06/2019 7:15 AM Staffing Anesthesiologist: Myrtie Soman, MD Preanesthetic Checklist Completed: patient identified, IV checked, site marked, risks and benefits discussed, surgical consent, monitors and equipment checked, pre-op evaluation and timeout performed Spinal Block Patient position: sitting Prep: DuraPrep Patient monitoring: heart rate, cardiac monitor, continuous pulse ox and blood pressure Approach: midline Location: L3-4 Injection technique: single-shot Needle Needle type: Sprotte  Needle gauge: 24 G Needle length: 9 cm Assessment Sensory level: T6

## 2019-12-07 DIAGNOSIS — R739 Hyperglycemia, unspecified: Secondary | ICD-10-CM | POA: Diagnosis not present

## 2019-12-07 DIAGNOSIS — D649 Anemia, unspecified: Secondary | ICD-10-CM | POA: Diagnosis not present

## 2019-12-07 DIAGNOSIS — K5900 Constipation, unspecified: Secondary | ICD-10-CM | POA: Diagnosis not present

## 2019-12-07 DIAGNOSIS — Z79891 Long term (current) use of opiate analgesic: Secondary | ICD-10-CM | POA: Diagnosis not present

## 2019-12-07 DIAGNOSIS — G250 Essential tremor: Secondary | ICD-10-CM | POA: Diagnosis not present

## 2019-12-07 DIAGNOSIS — Z471 Aftercare following joint replacement surgery: Secondary | ICD-10-CM | POA: Diagnosis not present

## 2019-12-07 DIAGNOSIS — E785 Hyperlipidemia, unspecified: Secondary | ICD-10-CM | POA: Diagnosis not present

## 2019-12-07 DIAGNOSIS — Z7982 Long term (current) use of aspirin: Secondary | ICD-10-CM | POA: Diagnosis not present

## 2019-12-07 DIAGNOSIS — Z96641 Presence of right artificial hip joint: Secondary | ICD-10-CM | POA: Diagnosis not present

## 2019-12-07 DIAGNOSIS — I10 Essential (primary) hypertension: Secondary | ICD-10-CM | POA: Diagnosis not present

## 2019-12-07 DIAGNOSIS — Z86010 Personal history of colonic polyps: Secondary | ICD-10-CM | POA: Diagnosis not present

## 2019-12-07 DIAGNOSIS — H269 Unspecified cataract: Secondary | ICD-10-CM | POA: Diagnosis not present

## 2019-12-09 ENCOUNTER — Encounter (HOSPITAL_COMMUNITY): Payer: Self-pay | Admitting: Orthopaedic Surgery

## 2019-12-09 DIAGNOSIS — I10 Essential (primary) hypertension: Secondary | ICD-10-CM | POA: Diagnosis not present

## 2019-12-09 DIAGNOSIS — D649 Anemia, unspecified: Secondary | ICD-10-CM | POA: Diagnosis not present

## 2019-12-09 DIAGNOSIS — H269 Unspecified cataract: Secondary | ICD-10-CM | POA: Diagnosis not present

## 2019-12-09 DIAGNOSIS — Z471 Aftercare following joint replacement surgery: Secondary | ICD-10-CM | POA: Diagnosis not present

## 2019-12-09 DIAGNOSIS — G250 Essential tremor: Secondary | ICD-10-CM | POA: Diagnosis not present

## 2019-12-09 DIAGNOSIS — E785 Hyperlipidemia, unspecified: Secondary | ICD-10-CM | POA: Diagnosis not present

## 2019-12-11 DIAGNOSIS — Z471 Aftercare following joint replacement surgery: Secondary | ICD-10-CM | POA: Diagnosis not present

## 2019-12-11 DIAGNOSIS — D649 Anemia, unspecified: Secondary | ICD-10-CM | POA: Diagnosis not present

## 2019-12-11 DIAGNOSIS — E785 Hyperlipidemia, unspecified: Secondary | ICD-10-CM | POA: Diagnosis not present

## 2019-12-11 DIAGNOSIS — G250 Essential tremor: Secondary | ICD-10-CM | POA: Diagnosis not present

## 2019-12-11 DIAGNOSIS — I10 Essential (primary) hypertension: Secondary | ICD-10-CM | POA: Diagnosis not present

## 2019-12-11 DIAGNOSIS — H269 Unspecified cataract: Secondary | ICD-10-CM | POA: Diagnosis not present

## 2019-12-13 DIAGNOSIS — D649 Anemia, unspecified: Secondary | ICD-10-CM | POA: Diagnosis not present

## 2019-12-13 DIAGNOSIS — H269 Unspecified cataract: Secondary | ICD-10-CM | POA: Diagnosis not present

## 2019-12-13 DIAGNOSIS — E785 Hyperlipidemia, unspecified: Secondary | ICD-10-CM | POA: Diagnosis not present

## 2019-12-13 DIAGNOSIS — I10 Essential (primary) hypertension: Secondary | ICD-10-CM | POA: Diagnosis not present

## 2019-12-13 DIAGNOSIS — G250 Essential tremor: Secondary | ICD-10-CM | POA: Diagnosis not present

## 2019-12-13 DIAGNOSIS — Z471 Aftercare following joint replacement surgery: Secondary | ICD-10-CM | POA: Diagnosis not present

## 2019-12-16 ENCOUNTER — Telehealth: Payer: Self-pay | Admitting: *Deleted

## 2019-12-16 DIAGNOSIS — E785 Hyperlipidemia, unspecified: Secondary | ICD-10-CM | POA: Diagnosis not present

## 2019-12-16 DIAGNOSIS — I10 Essential (primary) hypertension: Secondary | ICD-10-CM | POA: Diagnosis not present

## 2019-12-16 DIAGNOSIS — D649 Anemia, unspecified: Secondary | ICD-10-CM | POA: Diagnosis not present

## 2019-12-16 DIAGNOSIS — Z471 Aftercare following joint replacement surgery: Secondary | ICD-10-CM | POA: Diagnosis not present

## 2019-12-16 DIAGNOSIS — H269 Unspecified cataract: Secondary | ICD-10-CM | POA: Diagnosis not present

## 2019-12-16 DIAGNOSIS — G250 Essential tremor: Secondary | ICD-10-CM | POA: Diagnosis not present

## 2019-12-16 NOTE — Telephone Encounter (Signed)
7 day Ortho bundle call completed. 

## 2019-12-16 NOTE — Telephone Encounter (Signed)
Ortho bundle D/C call completed. Actual call completed on 12/09/19, but was supposed to contact patient later in the day and was unable to get through. Documentation delayed because of this and then not completed until today.

## 2019-12-19 ENCOUNTER — Encounter: Payer: Self-pay | Admitting: Orthopaedic Surgery

## 2019-12-19 ENCOUNTER — Ambulatory Visit (INDEPENDENT_AMBULATORY_CARE_PROVIDER_SITE_OTHER): Payer: Medicare Other | Admitting: Orthopaedic Surgery

## 2019-12-19 ENCOUNTER — Telehealth: Payer: Self-pay | Admitting: *Deleted

## 2019-12-19 DIAGNOSIS — Z96641 Presence of right artificial hip joint: Secondary | ICD-10-CM

## 2019-12-19 NOTE — Progress Notes (Signed)
The patient is now 2 weeks tomorrow status post a right total hip arthroplasty.  This was done the same day surgery.  He is very pleased with the whole process.  He denies any swelling in his feet or ankle or calf pain.  He is already driving.  Is not taking any pain medication.  He has been taking an aspirin twice a day.  On exam his right hip incision looks good to remove the staples.  There was a seroma about only 30 cc of fluid that I removed from the hip itself.  Overall his leg lengths look good and he looks good with his mobility.  He is very satisfied.  He will continue to increase his activities as comfort allows.  He can stop his aspirin.  We will see him back in 4 weeks to see how he is doing overall but no x-rays are needed.  All questions and concerns were answered and addressed.

## 2019-12-19 NOTE — Telephone Encounter (Signed)
14 day Ortho bundle call completed.  

## 2019-12-31 ENCOUNTER — Other Ambulatory Visit: Payer: Self-pay

## 2019-12-31 ENCOUNTER — Encounter: Payer: Self-pay | Admitting: Family Medicine

## 2019-12-31 ENCOUNTER — Ambulatory Visit (INDEPENDENT_AMBULATORY_CARE_PROVIDER_SITE_OTHER): Payer: Medicare Other

## 2019-12-31 DIAGNOSIS — Z23 Encounter for immunization: Secondary | ICD-10-CM

## 2020-01-06 DIAGNOSIS — Z471 Aftercare following joint replacement surgery: Secondary | ICD-10-CM | POA: Diagnosis not present

## 2020-01-07 ENCOUNTER — Telehealth: Payer: Self-pay | Admitting: *Deleted

## 2020-01-07 NOTE — Telephone Encounter (Signed)
Attempted 30 day Ortho bundle call to patient.

## 2020-01-08 ENCOUNTER — Telehealth: Payer: Self-pay | Admitting: *Deleted

## 2020-01-08 NOTE — Telephone Encounter (Signed)
Ortho bundle 30 day call completed. °

## 2020-01-16 ENCOUNTER — Encounter: Payer: Self-pay | Admitting: Orthopaedic Surgery

## 2020-01-16 ENCOUNTER — Ambulatory Visit (INDEPENDENT_AMBULATORY_CARE_PROVIDER_SITE_OTHER): Payer: Medicare Other | Admitting: Orthopaedic Surgery

## 2020-01-16 ENCOUNTER — Telehealth: Payer: Self-pay | Admitting: *Deleted

## 2020-01-16 DIAGNOSIS — Z96641 Presence of right artificial hip joint: Secondary | ICD-10-CM

## 2020-01-16 NOTE — Telephone Encounter (Signed)
Ortho bundle 6 week in office visit completed.

## 2020-01-16 NOTE — Progress Notes (Signed)
The patient is now 6 weeks status post a right total hip arthroplasty.  He states he is doing great and has no concerns with the hip.  He is not take anything for pain.  He is a very active 74 year old gentleman.  He has been having some low back pain and occasional numbness going down his right leg.  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  His right hip moves smoothly and fluidly and he really cannot tell he has a hip replacement.  His leg lengths are equal.  I did talk to him about back extension exercises.  For now additional watch this but if it worsens in any way he knows to contact us.  I can always see him sooner for the back if needed.  From a hip standpoint, I do not need to see him back for 6 months.  At that visit like standing low AP pelvis and lateral of his right operative if he does end up coming back earlier for his spine we would get 2 views of the lumbar spine.

## 2020-02-01 ENCOUNTER — Other Ambulatory Visit: Payer: Self-pay | Admitting: Family Medicine

## 2020-03-03 ENCOUNTER — Telehealth: Payer: Self-pay | Admitting: *Deleted

## 2020-03-03 NOTE — Telephone Encounter (Signed)
Attempted 90 day call to patient. No answer and left VM requesting call back. 

## 2020-03-03 NOTE — Telephone Encounter (Signed)
Ortho bundle 90 day call completed. 

## 2020-03-05 DIAGNOSIS — Z23 Encounter for immunization: Secondary | ICD-10-CM | POA: Diagnosis not present

## 2020-03-26 ENCOUNTER — Ambulatory Visit (INDEPENDENT_AMBULATORY_CARE_PROVIDER_SITE_OTHER): Payer: Medicare Other

## 2020-03-26 ENCOUNTER — Other Ambulatory Visit: Payer: Self-pay

## 2020-03-26 VITALS — BP 120/60 | HR 89 | Temp 98.0°F | Resp 20 | Wt 197.6 lb

## 2020-03-26 DIAGNOSIS — Z0000 Encounter for general adult medical examination without abnormal findings: Secondary | ICD-10-CM

## 2020-03-26 NOTE — Patient Instructions (Addendum)
[name] , Thank you for taking time to come for your Medicare Wellness Visit. I appreciate your ongoing commitment to your health goals. Please review the following plan we discussed and let me know if I can assist you in the future.   Screening recommendations/referrals: Colonoscopy: pt states he will schedule with Dr Watt Climes Recommended yearly ophthalmology/optometry visit for glaucoma screening and checkup Recommended yearly dental visit for hygiene and checkup  Vaccinations: Influenza vaccine: Done 12/31/19 Up to date Pneumococcal vaccine: Due and discussed Tdap vaccine: Due and discussed Shingles vaccine: 02/16/20 &  Covid-19: Completed 4/12, 5/3, & 03/05/20  Advanced directives: Please bring a copy of your health care power of attorney and living will to the office at your convenience.  Conditions/risks identified: Stay healthy and get back to playing golf  Next appointment: Follow up in one year for your annual wellness visit.   Preventive Care 23 Years and Older, Male Preventive care refers to lifestyle choices and visits with your health care provider that can promote health and wellness. What does preventive care include?  A yearly physical exam. This is also called an annual well check.  Dental exams once or twice a year.  Routine eye exams. Ask your health care provider how often you should have your eyes checked.  Personal lifestyle choices, including:  Daily care of your teeth and gums.  Regular physical activity.  Eating a healthy diet.  Avoiding tobacco and drug use.  Limiting alcohol use.  Practicing safe sex.  Taking low doses of aspirin every day.  Taking vitamin and mineral supplements as recommended by your health care provider. What happens during an annual well check? The services and screenings done by your health care provider during your annual well check will depend on your age, overall health, lifestyle risk factors, and family history of  disease. Counseling  Your health care provider may ask you questions about your:  Alcohol use.  Tobacco use.  Drug use.  Emotional well-being.  Home and relationship well-being.  Sexual activity.  Eating habits.  History of falls.  Memory and ability to understand (cognition).  Work and work Statistician. Screening  You may have the following tests or measurements:  Height, weight, and BMI.  Blood pressure.  Lipid and cholesterol levels. These may be checked every 5 years, or more frequently if you are over 27 years old.  Skin check.  Lung cancer screening. You may have this screening every year starting at age 12 if you have a 30-pack-year history of smoking and currently smoke or have quit within the past 15 years.  Fecal occult blood test (FOBT) of the stool. You may have this test every year starting at age 101.  Flexible sigmoidoscopy or colonoscopy. You may have a sigmoidoscopy every 5 years or a colonoscopy every 10 years starting at age 91.  Prostate cancer screening. Recommendations will vary depending on your family history and other risks.  Hepatitis C blood test.  Hepatitis B blood test.  Sexually transmitted disease (STD) testing.  Diabetes screening. This is done by checking your blood sugar (glucose) after you have not eaten for a while (fasting). You may have this done every 1-3 years.  Abdominal aortic aneurysm (AAA) screening. You may need this if you are a current or former smoker.  Osteoporosis. You may be screened starting at age 45 if you are at high risk. Talk with your health care provider about your test results, treatment options, and if necessary, the need for more  tests. Vaccines  Your health care provider may recommend certain vaccines, such as:  Influenza vaccine. This is recommended every year.  Tetanus, diphtheria, and acellular pertussis (Tdap, Td) vaccine. You may need a Td booster every 10 years.  Zoster vaccine. You may  need this after age 74.  Pneumococcal 13-valent conjugate (PCV13) vaccine. One dose is recommended after age 50.  Pneumococcal polysaccharide (PPSV23) vaccine. One dose is recommended after age 68. Talk to your health care provider about which screenings and vaccines you need and how often you need them. This information is not intended to replace advice given to you by your health care provider. Make sure you discuss any questions you have with your health care provider. Document Released: 03/27/2015 Document Revised: 11/18/2015 Document Reviewed: 12/30/2014 Elsevier Interactive Patient Education  2017 [place] Prevention in the Home Falls can cause injuries. They can happen to people of all ages. There are many things you can do to make your home safe and to help prevent falls. What can I do on the outside of my home?  Regularly fix the edges of walkways and driveways and fix any cracks.  Remove anything that might make you trip as you walk through a door, such as a raised step or threshold.  Trim any bushes or trees on the path to your home.  Use bright outdoor lighting.  Clear any walking paths of anything that might make someone trip, such as rocks or tools.  Regularly check to see if handrails are loose or broken. Make sure that both sides of any steps have handrails.  Any raised decks and porches should have guardrails on the edges.  Have any leaves, snow, or ice cleared regularly.  Use sand or salt on walking paths during winter.  Clean up any spills in your garage right away. This includes oil or grease spills. What can I do in the bathroom?  Use night lights.  Install grab bars by the toilet and in the tub and shower. Do not use towel bars as grab bars.  Use non-skid mats or decals in the tub or shower.  If you need to sit down in the shower, use a plastic, non-slip stool.  Keep the floor dry. Clean up any water that spills on the floor as soon as it  happens.  Remove soap buildup in the tub or shower regularly.  Attach bath mats securely with double-sided non-slip rug tape.  Do not have throw rugs and other things on the floor that can make you trip. What can I do in the bedroom?  Use night lights.  Make sure that you have a light by your bed that is easy to reach.  Do not use any sheets or blankets that are too big for your bed. They should not hang down onto the floor.  Have a firm chair that has side arms. You can use this for support while you get dressed.  Do not have throw rugs and other things on the floor that can make you trip. What can I do in the kitchen?  Clean up any spills right away.  Avoid walking on wet floors.  Keep items that you use a lot in easy-to-reach places.  If you need to reach something above you, use a strong step stool that has a grab bar.  Keep electrical cords out of the way.  Do not use floor polish or wax that makes floors slippery. If you must use wax, use non-skid floor  wax.  Do not have throw rugs and other things on the floor that can make you trip. What can I do with my stairs?  Do not leave any items on the stairs.  Make sure that there are handrails on both sides of the stairs and use them. Fix handrails that are broken or loose. Make sure that handrails are as long as the stairways.  Check any carpeting to make sure that it is firmly attached to the stairs. Fix any carpet that is loose or worn.  Avoid having throw rugs at the top or bottom of the stairs. If you do have throw rugs, attach them to the floor with carpet tape.  Make sure that you have a light switch at the top of the stairs and the bottom of the stairs. If you do not have them, ask someone to add them for you. What else can I do to help prevent falls?  Wear shoes that:  Do not have high heels.  Have rubber bottoms.  Are comfortable and fit you well.  Are closed at the toe. Do not wear sandals.  If you  use a stepladder:  Make sure that it is fully opened. Do not climb a closed stepladder.  Make sure that both sides of the stepladder are locked into place.  Ask someone to hold it for you, if possible.  Clearly mark and make sure that you can see:  Any grab bars or handrails.  First and last steps.  Where the edge of each step is.  Use tools that help you move around (mobility aids) if they are needed. These include:  Canes.  Walkers.  Scooters.  Crutches.  Turn on the lights when you go into a dark area. Replace any light bulbs as soon as they burn out.  Set up your furniture so you have a clear path. Avoid moving your furniture around.  If any of your floors are uneven, fix them.  If there are any pets around you, be aware of where they are.  Review your medicines with your doctor. Some medicines can make you feel dizzy. This can increase your chance of falling. Ask your doctor what other things that you can do to help prevent falls. This information is not intended to replace advice given to you by your health care provider. Make sure you discuss any questions you have with your health care provider. Document Released: 12/25/2008 Document Revised: 08/06/2015 Document Reviewed: 04/04/2014 Elsevier Interactive Patient Education  2017 [employer].

## 2020-03-26 NOTE — Progress Notes (Signed)
Subjective:   [name] is a 75 y.o. male who presents for Medicare Annual/Subsequent preventive examination.  Review of Systems     Cardiac Risk Factors include: advanced age (>84men, >34 women);dyslipidemia;male gender;hypertension     Objective:    Today's Vitals   03/26/20 1301  BP: 120/60  Pulse: 89  Resp: 20  Temp: 98 F (36.7 C)  SpO2: 93%  Weight: 197 lb 9.6 oz (89.6 kg)   Body mass index is 26.8 kg/m.  Advanced Directives 03/26/2020 12/06/2019 11/29/2019 02/05/2019  Does Patient Have a Medical Advance Directive? Yes Yes Yes Yes  Type of Advance Directive [place]  Does patient want to make changes to medical advance directive? - No - Patient declined - No - Patient declined  Copy of [place] in Chart? No - copy requested - - No - copy requested    Current Medications (verified) Outpatient Encounter Medications as of 03/26/2020  Medication Sig  . amLODipine (NORVASC) 10 MG tablet Take 1 tablet by mouth once daily  . Ascorbic Acid (VITAMIN C PO) Take 1,650 mg by mouth daily.  Marland Kitchen atorvastatin (LIPITOR) 40 MG tablet Take 1 tablet (40 mg total) by mouth daily.  . Cholecalciferol (VITAMIN D3) 125 MCG (5000 UT) TABS Take 5,000 Units by mouth daily.  Marland Kitchen QUERCETIN PO Take 500 mg by mouth daily.  . tadalafil (CIALIS) 20 MG tablet Take 1 tablet (20 mg total) by mouth daily as needed for erectile dysfunction. (Patient taking differently: Take 20 mg by mouth every other day.)  . Zinc 50 MG CAPS Take 50 mg by mouth daily.  . [DISCONTINUED] aspirin (ASPIRIN CHILDRENS) 81 MG chewable tablet Chew 1 tablet (81 mg total) by mouth 2 (two) times daily after a meal. (Patient not taking: Reported on 03/26/2020)  . [DISCONTINUED] methocarbamol (ROBAXIN) 500 MG tablet Take 1 tablet (500 mg total) by mouth every 6 (six) hours as needed for muscle spasms. (Patient not taking: Reported on 03/26/2020)  .  [DISCONTINUED] ondansetron (ZOFRAN ODT) 4 MG disintegrating tablet Take 1 tablet (4 mg total) by mouth every 8 (eight) hours as needed for nausea or vomiting. (Patient not taking: Reported on 03/26/2020)  . [DISCONTINUED] oxyCODONE (ROXICODONE) 5 MG immediate release tablet Take 1 tablet (5 mg total) by mouth every 4 (four) hours as needed for severe pain. (Patient not taking: Reported on 03/26/2020)   No facility-administered encounter medications on file as of 03/26/2020.    Allergies (verified) Ibuprofen and Choline fenofibrate   History: Past Medical History:  Diagnosis Date  . Arthritis   . COLONIC POLYPS, HX OF 09/13/2007   Qualifier: Diagnosis of  By: Linna Darner MD, Gwyndolyn Saxon   Dr Clarene Essex, GI Colonoscopy @ 5 year intervals    . DUODENAL ULCER, HX OF 09/13/2007   Qualifier: Diagnosis of  By: Linna Darner MD, Gwyndolyn Saxon    . GERD (gastroesophageal reflux disease)   . Hyperlipidemia   . Hypertension   . SKIN CANCER, HX OF 09/01/2009   Qualifier: Diagnosis of  By: Linna Darner MD, Fidela Juneau Cell  X 3-4; [facility]    . Ulcer 1989 & 2000   bleedng X2   Past Surgical History:  Procedure Laterality Date  . colonoscopy with polypectomy     Dr Watt Climes  . EYE SURGERY     post trauma with glss fragments  . INGUINAL HERNIA REPAIR  1970  . pilonidal cystectomy    .  TOTAL HIP ARTHROPLASTY Right 12/06/2019   Procedure: RIGHT TOTAL HIP ARTHROPLASTY ANTERIOR APPROACH;  Surgeon: Mcarthur Rossetti, MD;  Location: [facility];  Service: Orthopedics;  Laterality: Right;   Family History  Problem Relation Age of Onset  . Stroke Mother        late 84s  . Diabetes Brother        Type II  . Hypertension Brother   . Hyperlipidemia Brother   . Heart disease Neg Hx   . Cancer Neg Hx    Social History   Socioeconomic History  . Marital status: Married    Spouse name: Not on file  . Number of children: 2  . Years of education: 21  . Highest education level: Not on file  Occupational History  .  Occupation: Retired   Tobacco Use  . Smoking status: Former Smoker    Quit date: 03/15/2011    Years since quitting: 9.0  . Smokeless tobacco: Former Systems developer    Types: Secondary school teacher  . Vaping Use: Never used  Substance and Sexual Activity  . Alcohol use: No  . Drug use: No  . Sexual activity: Not on file  Other Topics Concern  . Not on file  Social History Narrative  . Not on file   Social Determinants of Health   Financial Resource Strain: Low Risk   . Difficulty of Paying Living Expenses: Not hard at all  Food Insecurity: No Food Insecurity  . Worried About Charity fundraiser in the Last Year: Never true  . Ran Out of Food in the Last Year: Never true  Transportation Needs: No Transportation Needs  . Lack of Transportation (Medical): No  . Lack of Transportation (Non-Medical): No  Physical Activity: Inactive  . Days of Exercise per Week: 0 days  . Minutes of Exercise per Session: 0 min  Stress: No Stress Concern Present  . Feeling of Stress : Not at all  Social Connections: Socially Integrated  . Frequency of Communication with Friends and Family: More than three times a week  . Frequency of Social Gatherings with Friends and Family: Twice a week  . Attends Religious Services: More than 4 times per year  . Active Member of Clubs or Organizations: Yes  . Attends Archivist Meetings: 1 to 4 times per year  . Marital Status: Married    Tobacco Counseling Counseling given: Not Answered   Clinical Intake:  Pre-visit preparation completed: Yes  Pain : No/denies pain     BMI - recorded: 26.8 Nutritional Status: BMI 25 -29 Overweight Nutritional Risks: None Diabetes: No  How often do you need to have someone help you when you read instructions, pamphlets, or other written materials from your doctor or pharmacy?: 1 - Never  Diabetic?No  Interpreter Needed?: No  Information entered by :: Charlott Rakes LPN   Activities of Daily Living In your  present state of health, do you have any difficulty performing the following activities: 03/26/2020 11/29/2019  Hearing? N N  Vision? N N  Difficulty concentrating or making decisions? N N  Walking or climbing stairs? N Y  Dressing or bathing? N N  Doing errands, shopping? N N  Preparing Food and eating ? N -  Using the Toilet? N -  In the past six months, have you accidently leaked urine? N -  Do you have problems with loss of bowel control? N -  Managing your Medications? N -  Managing your Finances? N -  Housekeeping or managing your Housekeeping? N -  Some recent data might be hidden    Patient Care Team: Vivi Barrack, MD as PCP - General (Family Medicine)  Indicate any recent Medical Services you may have received from other than [facility] providers in the past year (date may be approximate).     Assessment:   This is a routine wellness examination for [employer].  Hearing/Vision screen  Hearing Screening   125Hz  250Hz  500Hz  1000Hz  2000Hz  3000Hz  4000Hz  6000Hz  8000Hz   Right ear:           Left ear:           Comments: Pt denies any hearing issues  Vision Screening Comments: Pt follows up with [facility] for annual eye exams  Dietary issues and exercise activities discussed: Current Exercise Habits: Home exercise routine, Type of exercise: stretching, Time (Minutes): 25, Frequency (Times/Week): 5, Weekly Exercise (Minutes/Week): 125  Goals    . Patient Stated     Live healthy for as long I can and play golf      Depression Screen PHQ 2/9 Scores 03/26/2020 10/24/2019 02/05/2019 04/18/2012  PHQ - 2 Score 0 0 0 0    Fall Risk Fall Risk  03/26/2020 10/24/2019 02/05/2019 10/15/2018 04/18/2012  Falls in the past year? 0 0 0 0 No  Number falls in past yr: 0 - - - -  Injury with Fall? 0 - 0 - -  Risk for fall due to : Impaired vision - Impaired mobility - -  Follow up Falls prevention discussed - Education provided;Falls prevention discussed;Falls evaluation completed - -    FALL  RISK PREVENTION PERTAINING TO THE HOME:  Any stairs in or around the home? Yes  If so, are there any without handrails? No  Home free of loose throw rugs in walkways, pet beds, electrical cords, etc? Yes  Adequate lighting in your home to reduce risk of falls? Yes   ASSISTIVE DEVICES UTILIZED TO PREVENT FALLS:  Life alert? No  Use of a cane, walker or w/c? No  Grab bars in the bathroom? Yes  Shower chair or bench in shower? Yes  Elevated toilet seat or a handicapped toilet? Yes   TIMED UP AND GO:  Was the test performed? Yes .  Length of time to ambulate 10 feet: 10 sec.   Gait steady and fast without use of assistive device  Cognitive Function:     6CIT Screen 03/26/2020  What Year? 0 points  What month? 0 points  Count back from 20 0 points  Months in reverse 0 points  Repeat phrase 4 points    Immunizations Immunization History  Administered Date(s) Administered  . Fluad Quad(high Dose 65+) 12/19/2018, 12/31/2019  . HPV Quadrivalent 12/13/2017  . Influenza Whole 12/13/2011  . Influenza, High Dose Seasonal PF 12/26/2013, 12/24/2015, 12/16/2016, 12/15/2017  . Influenza-Unspecified 01/01/2015  . PFIZER SARS-COV-2 Vaccination 06/24/2019, 07/15/2019, 03/05/2020  . Td 09/01/2009  . Zoster Recombinat (Shingrix) 02/15/2017, 03/24/2017    TDAP status: Due, Education has been provided regarding the importance of this vaccine. Advised may receive this vaccine at local pharmacy or Health Dept. Aware to provide a copy of the vaccination record if obtained from local pharmacy or Health Dept. Verbalized acceptance and understanding.  Flu Vaccine status: Up to date  Pneumococcal vaccine status: Due, Education has been provided regarding the importance of this vaccine. Advised may receive this vaccine at local pharmacy or Health Dept. Aware to provide a copy of the vaccination  record if obtained from local pharmacy or Health Dept. Verbalized acceptance and  understanding.  Covid-19 vaccine status: Completed vaccines  Qualifies for Shingles Vaccine? Yes   Zostavax completed Yes   Shingrix Completed?: Yes  Screening Tests Health Maintenance  Topic Date Due  . COLONOSCOPY (Pts 45-22yrs Insurance coverage will need to be confirmed)  Never done  . PNA vac Low Risk Adult (1 of 2 - PCV13) Never done  . TETANUS/TDAP  03/26/2021 (Originally 09/02/2019)  . INFLUENZA VACCINE  Completed  . COVID-19 Vaccine  Completed  . Hepatitis C Screening  Completed    Health Maintenance  Health Maintenance Due  Topic Date Due  . COLONOSCOPY (Pts 45-54yrs Insurance coverage will need to be confirmed)  Never done  . PNA vac Low Risk Adult (1 of 2 - PCV13) Never done    Colon cancer screening: pt states he will call Dr Watt Climes for screening   Additional Screening:  Hepatitis C Screening:  Completed 03/23/18  Vision Screening: Recommended annual ophthalmology exams for early detection of glaucoma and other disorders of the eye. Is the patient up to date with their annual eye exam?  Yes  Who is the provider or what is the name of the office in which the patient attends annual eye exams? [facility]    Dental Screening: Recommended annual dental exams for proper oral hygiene  Community Resource Referral / Chronic Care Management: CRR required this visit?  No   CCM required this visit?  No      Plan:     I have personally reviewed and noted the following in the patient's chart:   . Medical and social history . Use of alcohol, tobacco or illicit drugs  . Current medications and supplements . Functional ability and status . Nutritional status . Physical activity . Advanced directives . List of other physicians . Hospitalizations, surgeries, and ER visits in previous 12 months . Vitals . Screenings to include cognitive, depression, and falls . Referrals and appointments  In addition, I have reviewed and discussed with patient certain  preventive protocols, quality metrics, and best practice recommendations. A written personalized care plan for preventive services as well as general preventive health recommendations were provided to patient.     Willette Brace, LPN   [id number]   Nurse Notes: None

## 2020-05-05 ENCOUNTER — Telehealth: Payer: Self-pay

## 2020-05-05 NOTE — Telephone Encounter (Signed)
.   LAST APPOINTMENT DATE: 03/26/2020   NEXT APPOINTMENT DATE:@8 /18/2022  MEDICATION:amLODipine (NORVASC) 10 MG tablet   PHARMACY:[id number] [place], [place] - [address]     OTHER COMMENTS:    Okay for refill?  Please advise

## 2020-05-06 ENCOUNTER — Other Ambulatory Visit: Payer: Self-pay | Admitting: *Deleted

## 2020-05-06 MED ORDER — AMLODIPINE BESYLATE 10 MG PO TABS
10.0000 mg | ORAL_TABLET | Freq: Every day | ORAL | 0 refills | Status: DC
Start: 2020-05-06 — End: 2020-08-03

## 2020-05-06 NOTE — Telephone Encounter (Signed)
Rx send to [facility]  

## 2020-06-24 DIAGNOSIS — H2513 Age-related nuclear cataract, bilateral: Secondary | ICD-10-CM | POA: Diagnosis not present

## 2020-07-15 ENCOUNTER — Encounter: Payer: Self-pay | Admitting: Orthopaedic Surgery

## 2020-07-15 ENCOUNTER — Ambulatory Visit (INDEPENDENT_AMBULATORY_CARE_PROVIDER_SITE_OTHER): Payer: Medicare Other

## 2020-07-15 ENCOUNTER — Ambulatory Visit (INDEPENDENT_AMBULATORY_CARE_PROVIDER_SITE_OTHER): Payer: Medicare Other | Admitting: Orthopaedic Surgery

## 2020-07-15 ENCOUNTER — Other Ambulatory Visit: Payer: Self-pay

## 2020-07-15 DIAGNOSIS — M5441 Lumbago with sciatica, right side: Secondary | ICD-10-CM

## 2020-07-15 DIAGNOSIS — Z96641 Presence of right artificial hip joint: Secondary | ICD-10-CM

## 2020-07-15 NOTE — Progress Notes (Signed)
Office Visit Note   Patient: [name]           Date of Birth: 08/24/1945           MRN: [id number] Visit Date: 07/15/2020              Requested by: Vivi Barrack, MD [address] [place],  [place] [address] PCP: Vivi Barrack, MD   Assessment & Plan: Visit Diagnoses:  1. Right-sided low back pain with right-sided sciatica, unspecified chronicity   2. Status post total replacement of right hip     Plan: I talked him about trying some outpatient physical therapy for his lumbar spine with any modalities that can help with core strengthening and decrease his back pain as well as decrease the sciatic symptoms to the right side.  Any modalities per the therapist discretion.  We will see about ordering this for [place] since they live more that way.  I will see him back in 6 weeks to see how he is done with therapy.  All question concerns were answered and addressed.  Follow-Up Instructions: Return in about 6 weeks (around 08/26/2020).   Orders:  Orders Placed This Encounter  Procedures  . XR HIP UNILAT W OR W/O PELVIS 2-3 VIEWS RIGHT  . XR Lumbar Spine 2-3 Views   No orders of the defined types were placed in this encounter.     Procedures: No procedures performed   Clinical Data: No additional findings.   Subjective: Chief Complaint  Patient presents with  . Right Hip - Routine Post Op  . Lower Back - Pain  The patient is a 75 year old gentleman who is 8 months status post a right total hip arthroplasty.  The right hip is doing well for him and he has no complaints with that.  He is an avid Air cabin crew.  He does have low back pain that occurs sporadically across his lower lumbar spine.  He does get numbness and tingling down his right leg as well.  He denies any change in bowel bladder function or weakness.  It is worse sometimes after he plays golf.  He is otherwise had no acute changes in medical status.  He says the right hip replacement is doing  very well.  HPI  Review of Systems There is currently listed no headache, chest pain, shortness of breath, fever, chills, nausea, vomiting  Objective: Vital Signs: There were no vitals taken for this visit.  Physical Exam He is alert and orient x3 and in no acute distress Ortho Exam Examination of his right hip shows it moves smoothly and fluidly with no issues at all.  The left hip also moves smoothly and fluidly.  There is no pain with either hip.  Examination of the lumbar spine does show pain in the lower back with flexion and extension.  He has good strength in his bilateral lower extremities today and then numbness and tingling. Specialty Comments:  No specialty comments available.  Imaging: XR HIP UNILAT W OR W/O PELVIS 2-3 VIEWS RIGHT  Result Date: 07/15/2020 An AP pelvis and lateral the right hip shows a well-seated total hip arthroplasty on the right side with no complicating features.  XR Lumbar Spine 2-3 Views  Result Date: 07/15/2020 2 views of the lumbar spine show degenerative changes at multiple levels.  There is significant disc space narrowing between L3 and L4.  There is a small spondylolisthesis between L5 and S1.    PMFS History:  Patient Active Problem List   Diagnosis Date Noted  . 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  . Hyperglycemia 10/24/2019  . Unilateral primary osteoarthritis, right hip 10/23/2019  . Erectile dysfunction 10/15/2018  . Plantar wart 04/06/2018  . Essential tremor 04/06/2018  . Dyslipidemia 03/26/2018  . Essential hypertension 03/23/2018  . History of colon polyps 03/23/2018  . History of peptic ulcer 03/23/2018  . Low back pain 03/23/2018   Past Medical History:  Diagnosis Date  . Arthritis   . COLONIC POLYPS, HX OF 09/13/2007   Qualifier: Diagnosis of  By: Linna Darner MD, Gwyndolyn Saxon   Dr Clarene Essex, GI Colonoscopy @ 5 year intervals    . DUODENAL ULCER, HX OF 09/13/2007   Qualifier: Diagnosis of  By: Linna Darner MD, Gwyndolyn Saxon     . GERD (gastroesophageal reflux disease)   . Hyperlipidemia   . Hypertension   . SKIN CANCER, HX OF 09/01/2009   Qualifier: Diagnosis of  By: Linna Darner MD, Fidela Juneau Cell  X 3-4; [facility]    . Ulcer 1989 & 2000   bleedng X2    Family History  Problem Relation Age of Onset  . Stroke Mother        late 21s  . Diabetes Brother        Type II  . Hypertension Brother   . Hyperlipidemia Brother   . Heart disease Neg Hx   . Cancer Neg Hx     Past Surgical History:  Procedure Laterality Date  . colonoscopy with polypectomy     Dr Watt Climes  . EYE SURGERY     post trauma with glss fragments  . INGUINAL HERNIA REPAIR  1970  . pilonidal cystectomy    . TOTAL HIP ARTHROPLASTY Right 12/06/2019   Procedure: RIGHT TOTAL HIP ARTHROPLASTY ANTERIOR APPROACH;  Surgeon: Mcarthur Rossetti, MD;  Location: [facility];  Service: Orthopedics;  Laterality: Right;   Social History   Occupational History  . Occupation: Retired   Tobacco Use  . Smoking status: Former Smoker    Quit date: 03/15/2011    Years since quitting: 9.3  . Smokeless tobacco: Former Systems developer    Types: Secondary school teacher  . Vaping Use: Never used  Substance and Sexual Activity  . Alcohol use: No  . Drug use: No  . Sexual activity: Not on file

## 2020-08-02 ENCOUNTER — Other Ambulatory Visit: Payer: Self-pay | Admitting: Family Medicine

## 2020-08-03 ENCOUNTER — Ambulatory Visit: Payer: Medicare Other | Attending: Orthopaedic Surgery | Admitting: Physical Therapy

## 2020-08-03 ENCOUNTER — Other Ambulatory Visit: Payer: Self-pay

## 2020-08-03 ENCOUNTER — Encounter: Payer: Self-pay | Admitting: Physical Therapy

## 2020-08-03 DIAGNOSIS — R293 Abnormal posture: Secondary | ICD-10-CM | POA: Insufficient documentation

## 2020-08-03 DIAGNOSIS — G8929 Other chronic pain: Secondary | ICD-10-CM | POA: Insufficient documentation

## 2020-08-03 DIAGNOSIS — M5441 Lumbago with sciatica, right side: Secondary | ICD-10-CM | POA: Diagnosis not present

## 2020-08-03 DIAGNOSIS — R262 Difficulty in walking, not elsewhere classified: Secondary | ICD-10-CM | POA: Diagnosis not present

## 2020-08-03 DIAGNOSIS — M6281 Muscle weakness (generalized): Secondary | ICD-10-CM | POA: Diagnosis not present

## 2020-08-03 NOTE — Therapy (Signed)
[facility] [address]  [place], [place], [address] Phone: [phone]   Fax:  [phone]  Physical Therapy Evaluation  Patient Details  Name: [name] MRN: [id number] Date of Birth: 12-27-1945 Referring Provider (PT): Jean Rosenthal, MD   Encounter Date: 08/03/2020   PT End of Session - 08/03/20 1434    Visit Number 1    Number of Visits 7    Date for PT Re-Evaluation 09/14/20    Authorization Type Medicare & MoO    PT Start Time 1316    PT Stop Time 1347    PT Time Calculation (min) 31 min    Activity Tolerance Patient tolerated treatment well    Behavior During Therapy [facility] for tasks assessed/performed           Past Medical History:  Diagnosis Date  . Arthritis   . COLONIC POLYPS, HX OF 09/13/2007   Qualifier: Diagnosis of  By: Linna Darner MD, Gwyndolyn Saxon   Dr Clarene Essex, GI Colonoscopy @ 5 year intervals    . DUODENAL ULCER, HX OF 09/13/2007   Qualifier: Diagnosis of  By: Linna Darner MD, Gwyndolyn Saxon    . GERD (gastroesophageal reflux disease)   . Hyperlipidemia   . Hypertension   . SKIN CANCER, HX OF 09/01/2009   Qualifier: Diagnosis of  By: Linna Darner MD, Fidela [place] Cell  X 3-4; [facility]    . Ulcer 1989 & 2000   bleedng X2    Past Surgical History:  Procedure Laterality Date  . colonoscopy with polypectomy     Dr Watt Climes  . EYE SURGERY     post trauma with glss fragments  . INGUINAL HERNIA REPAIR  1970  . pilonidal cystectomy    . TOTAL HIP ARTHROPLASTY Right 12/06/2019   Procedure: RIGHT TOTAL HIP ARTHROPLASTY ANTERIOR APPROACH;  Surgeon: Mcarthur Rossetti, MD;  Location: [facility];  Service: Orthopedics;  Laterality: Right;    There were no vitals filed for this visit.    Subjective Assessment - 08/03/20 1318    Subjective Patient reports LBP for the past 2 years. Used to play a lot of golf and has always been active. Went to a Restaurant manager, fast food for 3 months, who finally suggested  getting an Xray. Was treated with hip injection and finally a R THA. This got rid of part of the problem, but his back still hurts. Having trouble standing for long periods, especially during church. The R LE will go numb to the toes with these activities. Pain is located over B sides of the LB, R>L. Denies radiation of pain and B&B chnages. Slightly better with stretching and laying flat. would like to return to flaying a full game of golf.    Pertinent History hx skin CA, HTN, HLD, GERD, R THA 2021    Limitations Lifting;Standing;Walking    How long can you sit comfortably? unlimited    How long can you stand comfortably? variable    How long can you walk comfortably? variable    Diagnostic tests 07/15/20 R hip xray: well-seated total hip arthroplasty on the right side with no complicating features.; 34/19/37 lumbar xray: degenerative changes at multiple levels. There is significant disc space narrowing between L3 and L4.  There is a small spondylolisthesis between L5 and S1.    Patient Stated Goals decrease, return to golf    Currently in Pain? Yes    Pain Score 1     Pain Location Back  Pain Orientation Lower;Right;Left    Pain Descriptors / Indicators Aching    Pain Type Chronic pain              [facility] PT Assessment - 08/03/20 1324      Assessment   Medical Diagnosis R sided LBP with R sided sciatica    Referring Provider (PT) Jean Rosenthal, MD    Onset Date/Surgical Date 08/04/18    Next MD Visit 08/26/20    Prior Therapy -yes s/p R THA      Precautions   Precautions None      Balance Screen   Has the patient fallen in the past 6 months No    Has the patient had a decrease in activity level because of a fear of falling?  No    Is the patient reluctant to leave their home because of a fear of falling?  No      Home Ecologist residence    Living Arrangements Spouse/significant other    Available Help at Discharge Family    Type of  [place] to enter    Entrance Stairs-Number of Steps [place] One level    [place] - 2 wheels;Cane - single point;Crutches      Prior Function   Level of Independence Independent    Vocation Retired    Environmental health practitioner   Overall Cognitive Status Within Functional Limits for tasks assessed      Sensation   Light Touch Appears Intact      Coordination   Gross Motor Movements are Fluid and Coordinated Yes      Posture/Postural Control   Posture/Postural Control Postural limitations    Postural Limitations Rounded Shoulders;Forward head;Increased thoracic kyphosis      ROM / Strength   AROM / PROM / Strength AROM;Strength      AROM   AROM Assessment Site Lumbar;Hip    Right/Left Hip Right;Left    Right Hip External Rotation  30    Right Hip Internal Rotation  31    Left Hip External Rotation  29    Left Hip Internal Rotation  28    Lumbar Flexion floor    Lumbar Extension moderately limited    Lumbar - Right Side Bend jt line    Lumbar - Left Side Bend proximal tibia    Lumbar - Right Rotation mildly limited    Lumbar - Left Rotation mildly limited      Strength   Strength Assessment Site Hip;Knee;Ankle    Right/Left Hip Right;Left    Right Hip Flexion 4/5    Right Hip ABduction 4/5    Right Hip ADduction 4+/5    Left Hip Flexion 4+/5    Left Hip ABduction 4+/5    Left Hip ADduction 4+/5    Right/Left Knee Right;Left    Right Knee Flexion 5/5    Right Knee Extension 5/5    Left Knee Flexion 4+/5    Left Knee Extension 5/5    Right/Left Ankle Right;Left    Right Ankle Dorsiflexion 4+/5    Right Ankle Plantar Flexion 4+/5    Left Ankle Dorsiflexion 4+/5    Left Ankle Plantar Flexion 4+/5      Flexibility   Soft Tissue Assessment /Muscle Length yes    Quadriceps B mildly tight in mod thomas  Piriformis L moderately tight in fig 4 and KTOS, R mildly tight in fig 4  and KTOS      Palpation   Palpation comment no TTP along spine, hips, buttock; increased soft tissue restricion in L lumbar paraspinals and R buttock      Special Tests   Other special tests B SLR negative      Ambulation/Gait   Gait Pattern Step-through pattern;Trunk flexed;Lateral hip instability    Ambulation Surface Level;Indoor    Gait velocity WFL                      Objective measurements completed on examination: See above findings.             PT Education - 08/03/20 1434    Education Details prognosis, POC, HEP    Person(s) Educated Patient    Methods Explanation;Demonstration;Tactile cues;Verbal cues;Handout    Comprehension Verbalized understanding;Returned demonstration            PT Short Term Goals - 08/03/20 1440      PT SHORT TERM GOAL #1   Title Patient to be independent with initial HEP.    Time 3    Period Weeks    Status New    Target Date 08/24/20             PT Long Term Goals - 08/03/20 1440      PT LONG TERM GOAL #1   Title Patient to be independent with advanced HEP.6    Period Weeks    Status New    Target Date 09/14/20      PT LONG TERM GOAL #2   Title Patient to demonstrate B hip strength >/=4+/5.    Time 6    Period Weeks    Status New    Target Date 09/14/20      PT LONG TERM GOAL #3   Title Patient to demonstrate lumbar AROM WFL and without pain limiting.    Time 6    Period Weeks    Status New    Target Date 09/14/20      PT LONG TERM GOAL #4   Title Patient to report tolerance for a full game of golft with pain </=2/10.    Time 6    Period Weeks    Status New    Target Date 09/14/20      PT LONG TERM GOAL #5   Title Patient to report 75% improvement in pain levels.    Time 6    Period Weeks    Status New    Target Date 09/14/20                  Plan - 08/03/20 1438    Clinical Impression Statement Patient is a 75 y/o M presenting to OPPT with c/o chronic R>L sided LBP for  the past 2 years. Denies radiation of pain, however does report N/T down the R LE to the toes. Denies B&B changes. Aggravating factors include standing for long periods and playing golf. Patient reports that he would like to return to playing a full game of golf. Patient today presenting with rounded and slightly kyphotic posture, slightly limited B hip AROM, limited lumbar AROM, R hip weakness, B hip flexor and piriformis tightness, and gait deviations. Patient was educated on gentle stretching and strengthening HEP- patient reported understanding. Would benefit from skilled PT services 1x/week for 6 weeks to address aforementioned impairments.    Personal Factors  and Comorbidities Age;Comorbidity 3+;Fitness;Past/Current Experience;Time since onset of injury/illness/exacerbation    Comorbidities hx skin CA, HTN, HLD, GERD, R THA 2021    Examination-Activity Limitations Bathing;Bed Mobility;Sleep;Bend;Squat;Stairs;Carry;Stand;Transfers;Dressing;Lift;Hygiene/Grooming;Locomotion Level;Reach Overhead    Examination-Participation Restrictions Meal Prep;Laundry;Yard Work;Community Activity;Shop;Cleaning;Church    Stability/Clinical Decision Making Evolving/Moderate complexity    Clinical Decision Making Moderate    Rehab Potential Good    PT Frequency 1x / week    PT Duration 6 weeks    PT Treatment/Interventions ADLs/Self Care Home Management;Cryotherapy;Electrical Stimulation;Moist Heat;Balance training;Therapeutic exercise;Therapeutic activities;Functional mobility training;Stair training;Gait training;Ultrasound;Neuromuscular re-education;Patient/family education;Manual techniques;Taping;Energy conservation;Dry needling;Passive range of motion    PT Next Visit Plan lumbar FOTO; reassess HEP; progress hip and core strenghtening and lumbar mobility    Consulted and Agree with Plan of Care Patient           Patient will benefit from skilled therapeutic intervention in order to improve the following  deficits and impairments:  Abnormal gait,Hypomobility,Decreased activity tolerance,Pain,Decreased strength,Increased fascial restricitons,Decreased balance,Difficulty walking,Increased muscle spasms,Improper body mechanics,Decreased range of motion,Postural dysfunction  Visit Diagnosis: Chronic bilateral low back pain with right-sided sciatica  Difficulty in walking, not elsewhere classified  Abnormal posture  Muscle weakness (generalized)     Problem List Patient Active Problem List   Diagnosis Date Noted  . 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  . Hyperglycemia 10/24/2019  . Unilateral primary osteoarthritis, right hip 10/23/2019  . Erectile dysfunction 10/15/2018  . Plantar wart 04/06/2018  . Essential tremor 04/06/2018  . Dyslipidemia 03/26/2018  . Essential hypertension 03/23/2018  . History of colon polyps 03/23/2018  . History of peptic ulcer 03/23/2018  . Low back pain 03/23/2018     Janene Harvey, PT, DPT 08/03/20 2:44 PM   [facility] [address]  [place], [place], [address] Phone: [phone]   Fax:  [phone]  Name: [name] MRN: [id number] Date of Birth: 25-Mar-1945

## 2020-08-12 ENCOUNTER — Ambulatory Visit: Payer: Medicare Other | Admitting: Physical Therapy

## 2020-08-19 ENCOUNTER — Ambulatory Visit: Payer: Medicare Other | Attending: Orthopaedic Surgery

## 2020-08-19 ENCOUNTER — Other Ambulatory Visit: Payer: Self-pay

## 2020-08-19 DIAGNOSIS — R262 Difficulty in walking, not elsewhere classified: Secondary | ICD-10-CM | POA: Diagnosis not present

## 2020-08-19 DIAGNOSIS — R293 Abnormal posture: Secondary | ICD-10-CM | POA: Diagnosis not present

## 2020-08-19 DIAGNOSIS — G8929 Other chronic pain: Secondary | ICD-10-CM | POA: Insufficient documentation

## 2020-08-19 DIAGNOSIS — M6281 Muscle weakness (generalized): Secondary | ICD-10-CM | POA: Diagnosis not present

## 2020-08-19 DIAGNOSIS — M5441 Lumbago with sciatica, right side: Secondary | ICD-10-CM | POA: Insufficient documentation

## 2020-08-19 NOTE — Therapy (Signed)
[facility] [address]  [place], [place], [address] Phone: [phone]   Fax:  [phone]  Physical Therapy Treatment  Patient Details  Name: [name] MRN: [id number] Date of Birth: August 23, 1945 Referring Provider (PT): Jean Rosenthal, MD   Encounter Date: 08/19/2020   PT End of Session - 08/19/20 1445    Visit Number 2    Number of Visits 7    Date for PT Re-Evaluation 09/14/20    Authorization Type Medicare & MoO    PT Start Time 1402    PT Stop Time 1442    PT Time Calculation (min) 40 min    Activity Tolerance Patient tolerated treatment well    Behavior During Therapy [facility] for tasks assessed/performed           Past Medical History:  Diagnosis Date  . Arthritis   . COLONIC POLYPS, HX OF 09/13/2007   Qualifier: Diagnosis of  By: Linna Darner MD, Gwyndolyn Saxon   Dr Clarene Essex, GI Colonoscopy @ 5 year intervals    . DUODENAL ULCER, HX OF 09/13/2007   Qualifier: Diagnosis of  By: Linna Darner MD, Gwyndolyn Saxon    . GERD (gastroesophageal reflux disease)   . Hyperlipidemia   . Hypertension   . SKIN CANCER, HX OF 09/01/2009   Qualifier: Diagnosis of  By: Linna Darner MD, Fidela [place] Cell  X 3-4; [place]  [facility]    . Ulcer 1989 & 2000   bleedng X2    Past Surgical History:  Procedure Laterality Date  . colonoscopy with polypectomy     Dr Watt Climes  . EYE SURGERY     post trauma with glss fragments  . INGUINAL HERNIA REPAIR  1970  . pilonidal cystectomy    . TOTAL HIP ARTHROPLASTY Right 12/06/2019   Procedure: RIGHT TOTAL HIP ARTHROPLASTY ANTERIOR APPROACH;  Surgeon: Mcarthur Rossetti, MD;  Location: [facility];  Service: Orthopedics;  Laterality: Right;    There were no vitals filed for this visit.   Subjective Assessment - 08/19/20 1402    Subjective Pt reports standing for prolonged periods make his R LE go numb. "The stretches have been helping my low back."    Pertinent History hx skin CA, HTN, HLD, GERD, R  THA 2021    Diagnostic tests 07/15/20 R hip xray: well-seated total hip arthroplasty on the right side with no complicating features.; 12/28/49 lumbar xray: degenerative changes at multiple levels. There is significant disc space narrowing between L3 and L4.  There is a small spondylolisthesis between L5 and S1.    Patient Stated Goals decrease, return to golf    Currently in Pain? No/denies              [facility] PT Assessment - 08/19/20 0001      Observation/Other Assessments   Focus on Therapeutic Outcomes (FOTO)  Lumbar: 54, Predicted: 37                         [facility] Adult PT Treatment/Exercise - 08/19/20 0001      Exercises   Exercises Lumbar;Knee/Hip      Lumbar Exercises: Stretches   Piriformis Stretch Right;30 seconds    Piriformis Stretch Limitations seated with LE on table    Figure 4 Stretch 2 reps;30 seconds;Supine;With overpressure      Lumbar Exercises: Aerobic   Nustep L4x28min      Lumbar Exercises: Supine   Bridge 10 reps  Bridge Limitations red TB and TrA brace    Straight Leg Raise 10 reps;2 seconds      Lumbar Exercises: Sidelying   Clam Both;10 reps;3 seconds    Hip Abduction Both;10 reps                  PT Education - 08/19/20 1444    Education Details HEP Access Code: [email or web address]    Person(s) Educated Patient    Methods Explanation;Demonstration;Handout;Verbal cues    Comprehension Verbalized understanding;Returned demonstration;Verbal cues required            PT Short Term Goals - 08/19/20 1445      PT SHORT TERM GOAL #1   Title Patient to be independent with initial HEP.    Time 3    Period Weeks    Status On-going    Target Date 08/24/20             PT Long Term Goals - 08/19/20 1445      PT LONG TERM GOAL #1   Title Patient to be independent with advanced HEP.6    Period Weeks    Status On-going      PT LONG TERM GOAL #2   Title Patient to demonstrate B hip strength >/=4+/5.    Time 6    Period  Weeks    Status On-going      PT LONG TERM GOAL #3   Title Patient to demonstrate lumbar AROM WFL and without pain limiting.    Time 6    Period Weeks    Status On-going      PT LONG TERM GOAL #4   Title Patient to report tolerance for a full game of golft with pain </=2/10.    Time 6    Period Weeks    Status On-going      PT LONG TERM GOAL #5   Title Patient to report 75% improvement in pain levels.    Time 6    Period Weeks    Status On-going                 Plan - 08/19/20 1446    Clinical Impression Statement Pt responded well to treatment. He reports having increased N/T down the R LE while standing for prolonged periods of time. He noted relief of LBP from the exercises given last time. He reports the most difficulty with piriformis stretches d/t tightness and mild pain. Overall no complaints from him, but did require cues to keep alignment during S/L exercises. He would benefit from trying STM in the glutes to address soft tissue restrictions during stretches.    Personal Factors and Comorbidities Age;Comorbidity 3+;Fitness;Past/Current Experience;Time since onset of injury/illness/exacerbation    Comorbidities hx skin CA, HTN, HLD, GERD, R THA 2021    PT Frequency 1x / week    PT Duration 6 weeks    PT Treatment/Interventions ADLs/Self Care Home Management;Cryotherapy;Electrical Stimulation;Moist Heat;Balance training;Therapeutic exercise;Therapeutic activities;Functional mobility training;Stair training;Gait training;Ultrasound;Neuromuscular re-education;Patient/family education;Manual techniques;Taping;Energy conservation;Dry needling;Passive range of motion    PT Next Visit Plan progress hip and core strenghtening and lumbar mobility    Consulted and Agree with Plan of Care Patient           Patient will benefit from skilled therapeutic intervention in order to improve the following deficits and impairments:  Abnormal gait,Hypomobility,Decreased activity  tolerance,Pain,Decreased strength,Increased fascial restricitons,Decreased balance,Difficulty walking,Increased muscle spasms,Improper body mechanics,Decreased range of motion,Postural dysfunction  Visit Diagnosis: Chronic bilateral low back pain with right-sided sciatica  Difficulty in walking, not elsewhere classified  Abnormal posture  Muscle weakness (generalized)     Problem List Patient Active Problem List   Diagnosis Date Noted  . 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  . Hyperglycemia 10/24/2019  . Unilateral primary osteoarthritis, right hip 10/23/2019  . Erectile dysfunction 10/15/2018  . Plantar wart 04/06/2018  . Essential tremor 04/06/2018  . Dyslipidemia 03/26/2018  . Essential hypertension 03/23/2018  . History of colon polyps 03/23/2018  . History of peptic ulcer 03/23/2018  . Low back pain 03/23/2018    Artist Pais, PTA 08/19/2020, 4:24 PM  [facility] [address]  [place], [place], [address] Phone: [phone]   Fax:  [phone]  Name: [name] MRN: [id number] Date of Birth: [birth year]

## 2020-08-25 ENCOUNTER — Other Ambulatory Visit: Payer: Self-pay

## 2020-08-25 ENCOUNTER — Encounter: Payer: Self-pay | Admitting: Physical Therapy

## 2020-08-25 ENCOUNTER — Ambulatory Visit: Payer: Medicare Other | Admitting: Physical Therapy

## 2020-08-25 DIAGNOSIS — R293 Abnormal posture: Secondary | ICD-10-CM | POA: Diagnosis not present

## 2020-08-25 DIAGNOSIS — R262 Difficulty in walking, not elsewhere classified: Secondary | ICD-10-CM

## 2020-08-25 DIAGNOSIS — M6281 Muscle weakness (generalized): Secondary | ICD-10-CM

## 2020-08-25 DIAGNOSIS — G8929 Other chronic pain: Secondary | ICD-10-CM | POA: Diagnosis not present

## 2020-08-25 DIAGNOSIS — M5441 Lumbago with sciatica, right side: Secondary | ICD-10-CM | POA: Diagnosis not present

## 2020-08-25 NOTE — Therapy (Signed)
[facility] [place] [address]  [place], [place], [address] Phone: [phone]   Fax:  [phone]  Physical Therapy Treatment  Patient Details  Name: [name] MRN: [id number] Date of Birth: 1945-09-16 Referring Provider (PT): Jean Rosenthal, MD   Encounter Date: 08/25/2020   PT End of Session - 08/25/20 1443     Visit Number 3    Number of Visits 7    Date for PT Re-Evaluation 09/14/20    Authorization Type Medicare & MoO    PT Start Time 9794    PT Stop Time 8016    PT Time Calculation (min) 46 min    Activity Tolerance Patient tolerated treatment well    Behavior During Therapy [facility] for tasks assessed/performed             Past Medical History:  Diagnosis Date   Arthritis    COLONIC POLYPS, HX OF 09/13/2007   Qualifier: Diagnosis of  By: Linna Darner MD, Gwyndolyn Saxon   Dr Clarene Essex, GI Colonoscopy @ 5 year intervals     DUODENAL ULCER, HX OF 09/13/2007   Qualifier: Diagnosis of  By: Linna Darner MD, Gwyndolyn Saxon     GERD (gastroesophageal reflux disease)    Hyperlipidemia    Hypertension    SKIN CANCER, HX OF 09/01/2009   Qualifier: Diagnosis of  By: Linna Darner MD, Fidela [place] Cell  X 3-4; [facility]     Ulcer 1989 & 2000   bleedng X2    Past Surgical History:  Procedure Laterality Date   colonoscopy with polypectomy     Dr Watt Climes   EYE SURGERY     post trauma with glss fragments   INGUINAL HERNIA REPAIR  1970   pilonidal cystectomy     TOTAL HIP ARTHROPLASTY Right 12/06/2019   Procedure: RIGHT TOTAL HIP ARTHROPLASTY ANTERIOR APPROACH;  Surgeon: Mcarthur Rossetti, MD;  Location: [facility];  Service: Orthopedics;  Laterality: Right;    There were no vitals filed for this visit.   Subjective Assessment - 08/25/20 1358     Subjective Reports that he is not having the back pain that he had before, however willl still experience N/T down to the R foot and B LE weakness after 10-15 minutes of standing.  This resolves once he starts moving again. Denies questions/concerns on HEP and reports compliance.    Pertinent History hx skin CA, HTN, HLD, GERD, R THA 2021    Diagnostic tests 07/15/20 R hip xray: well-seated total hip arthroplasty on the right side with no complicating features.; 55/37/48 lumbar xray: degenerative changes at multiple levels. There is significant disc space narrowing between L3 and L4.  There is a small spondylolisthesis between L5 and S1.    Patient Stated Goals decrease, return to golf    Currently in Pain? No/denies                [facility] PT Assessment - 08/25/20 0001       AROM   Lumbar Flexion floor    Lumbar Extension mild-moderately limited    Lumbar - Right Side Bend proximal tibia    Lumbar - Left Side Bend proximal tibia    Lumbar - Right Rotation mildly limited    Lumbar - Left Rotation [facility]      Strength   Right Hip Flexion 4+/5    Right Hip Extension 4/5    Right Hip External Rotation  4/5    Right Hip Internal  Rotation 4+/5    Right Hip ABduction 4/5    Right Hip ADduction 4+/5    Left Hip Flexion 4+/5    Left Hip Extension 4/5    Left Hip External Rotation 4+/5    Left Hip Internal Rotation 4+/5    Left Hip ABduction 4+/5    Left Hip ADduction 4+/5                           [facility] Adult PT Treatment/Exercise - 08/25/20 0001       Lumbar Exercises: Stretches   Other Lumbar Stretch Exercise forward, R/L prayer stretch with green pball 5x5" each   adjusted angle to improve R hip tolerance     Lumbar Exercises: Aerobic   Nustep L5x65mn (UEs/LEs)      Lumbar Exercises: Machines for Strengthening   Other Lumbar Machine Exercise straight arm pulldown 10x10#   cues for core contraction   Other Lumbar Machine Exercise narrow cybex row 25# 2x10   stopping at neutral; cues for scapular retraction     Lumbar Exercises: Standing   Other Standing Lumbar Exercises R/L paloff press and resisted trunk rotation with green TB 10x each    cues to decrease speed; pt noting muscle soreness     Knee/Hip Exercises: Standing   Hip Flexion Stengthening;Both;2 sets;20 reps;Knee straight    Hip Flexion Limitations 2x20 resisted march with red loop   cues to decrease speed   Other Standing Knee Exercises sidestepping with yellow loop around ankles 2x333f  good effort to maintain neutral posture                   PT Education - 08/25/20 1443     Education Details update to HEP    Person(s) Educated Patient    Methods Explanation;Demonstration;Tactile cues;Verbal cues;Handout    Comprehension Verbalized understanding;Returned demonstration              PT Short Term Goals - 08/25/20 1421       PT SHORT TERM GOAL #1   Title Patient to be independent with initial HEP.    Time 3    Period Weeks    Status Achieved    Target Date 08/24/20               PT Long Term Goals - 08/25/20 1422       PT LONG TERM GOAL #1   Title Patient to be independent with advanced HEP.    Period Weeks    Status Partially Met   met for current     PT LONG TERM GOAL #2   Title Patient to demonstrate B hip strength >/=4+/5.    Time 6    Period Weeks    Status Partially Met   improved in R hip flexion     PT LONG TERM GOAL #3   Title Patient to demonstrate lumbar AROM WFL and without pain limiting.    Time 6    Period Weeks    Status On-going   improved in extension, R sidebending, and L rotation     PT LONG TERM GOAL #4   Title Patient to report tolerance for a full game of golft with pain </=2/10.    Time 6    Period Weeks    Status On-going   has not attempted     PT LONG TERM GOAL #5   Title Patient to report 75% improvement in pain levels.  Time 6    Period Weeks    Status On-going   reports atleast 90% improvement in pain                  Plan - 08/25/20 1443     Clinical Impression Statement Patient arrived to session with report of good improvement in LBP. However, still experiences  N/T down to the R foot and B LE weakness after 10-15 minutes of standing. Denies questions/concerns on HEP and reports compliance. Patient reports at least 90% improvement in pain levels. Strength testing revealed improvement in R hip flexion, however with weakness still remaining in other muscle groups. Lumbar AROM has improved in extension, R sidebending, and L rotation. Patient performed progressing hip and core strengthening ther-ex. Frequent cueing required today to decrease speed and increase muscle control throughout ther-ex. Patient noted some LB muscle soreness after core strengthening, which was addressed with gentle stretching. Patient remarking on the fact that he has not yet returned to working out on his total gym, thus initiated light weight machine strengthening with cues for form. Patient without complaints at end of session. Patient is demonstrating good progress towards goals. Would benefit from continued skilled PT services to address remaining goals.    Personal Factors and Comorbidities Age;Comorbidity 3+;Fitness;Past/Current Experience;Time since onset of injury/illness/exacerbation    Comorbidities hx skin CA, HTN, HLD, GERD, R THA 2021    PT Frequency 1x / week    PT Duration 6 weeks    PT Treatment/Interventions ADLs/Self Care Home Management;Cryotherapy;Electrical Stimulation;Moist Heat;Balance training;Therapeutic exercise;Therapeutic activities;Functional mobility training;Stair training;Gait training;Ultrasound;Neuromuscular re-education;Patient/family education;Manual techniques;Taping;Energy conservation;Dry needling;Passive range of motion    PT Next Visit Plan progress hip and core strenghtening and lumbar mobility    Consulted and Agree with Plan of Care Patient             Patient will benefit from skilled therapeutic intervention in order to improve the following deficits and impairments:  Abnormal gait, Hypomobility, Decreased activity tolerance, Pain, Decreased  strength, Increased fascial restricitons, Decreased balance, Difficulty walking, Increased muscle spasms, Improper body mechanics, Decreased range of motion, Postural dysfunction  Visit Diagnosis: Chronic bilateral low back pain with right-sided sciatica  Difficulty in walking, not elsewhere classified  Abnormal posture  Muscle weakness (generalized)     Problem List Patient Active Problem List   Diagnosis Date Noted   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   Hyperglycemia 10/24/2019   Unilateral primary osteoarthritis, right hip 10/23/2019   Erectile dysfunction 10/15/2018   Plantar wart 04/06/2018   Essential tremor 04/06/2018   Dyslipidemia 03/26/2018   Essential hypertension 03/23/2018   History of colon polyps 03/23/2018   History of peptic ulcer 03/23/2018   Low back pain 03/23/2018     Janene Harvey, PT, DPT 08/25/20 2:45 PM    [facility] [place] [address]  [place], [place], [address] Phone: [phone]   Fax:  [phone]  Name: EVANGELOS PAULINO MRN: [id number] Date of Birth: 1945/11/19

## 2020-08-26 ENCOUNTER — Ambulatory Visit (INDEPENDENT_AMBULATORY_CARE_PROVIDER_SITE_OTHER): Payer: Medicare Other | Admitting: Orthopaedic Surgery

## 2020-08-26 ENCOUNTER — Encounter: Payer: Self-pay | Admitting: Orthopaedic Surgery

## 2020-08-26 DIAGNOSIS — Z96641 Presence of right artificial hip joint: Secondary | ICD-10-CM

## 2020-08-26 DIAGNOSIS — M5441 Lumbago with sciatica, right side: Secondary | ICD-10-CM

## 2020-08-26 NOTE — Progress Notes (Signed)
The patient is here today in follow-up for his right-sided low back pain and sciatica.  I have replaced his right hip less than a year ago but far enough out that his hip is not an issue.  He has been playing golf up until when he started therapy because the back was hurting him bad enough.  He says therapy is helped quite a bit on his lumbar spine.  He has 2 more therapy sessions left.  He still having some numbness down his right leg after standing but he says moving helps and he is not interested in any other intervention.  He is an active 75 year old gentleman.  He has fluid and full range of motion of his right operative hip.  He has good strength in his bilateral extremities and not a significant straight leg raise at all on the right side.  Overall I do feel his flexion extension of his lumbar spine is improved with therapy.  At this point follow-up can be as needed since he is doing well.  I agree with him going to his last 2 therapy sessions and transitioning to home exercise program.  If he does have worsening back issues, my neck step would be to obtain an MRI of the lumbar spine.  He also knows that if he has any issues with his right operative hip to give [place] a call.

## 2020-09-02 ENCOUNTER — Ambulatory Visit: Payer: Medicare Other

## 2020-09-02 ENCOUNTER — Other Ambulatory Visit: Payer: Self-pay

## 2020-09-02 DIAGNOSIS — R293 Abnormal posture: Secondary | ICD-10-CM

## 2020-09-02 DIAGNOSIS — M5441 Lumbago with sciatica, right side: Secondary | ICD-10-CM | POA: Diagnosis not present

## 2020-09-02 DIAGNOSIS — M6281 Muscle weakness (generalized): Secondary | ICD-10-CM | POA: Diagnosis not present

## 2020-09-02 DIAGNOSIS — G8929 Other chronic pain: Secondary | ICD-10-CM | POA: Diagnosis not present

## 2020-09-02 DIAGNOSIS — R262 Difficulty in walking, not elsewhere classified: Secondary | ICD-10-CM

## 2020-09-02 NOTE — Therapy (Signed)
[facility] [place] [facility] [address]  [place], [place], [address] Phone: [phone]   Fax:  [phone]  Physical Therapy Treatment  Patient Details  Name: [name] MRN: [id number] Date of Birth: 01-18-1946 Referring Provider (PT): Jean Rosenthal, MD   Encounter Date: 09/02/2020   PT End of Session - 09/02/20 1441     Visit Number 4    Number of Visits 7    Date for PT Re-Evaluation 09/14/20    Authorization Type Medicare & MoO    PT Start Time 3536    PT Stop Time 1443    PT Time Calculation (min) 43 min    Activity Tolerance Patient tolerated treatment well    Behavior During Therapy [facility] for tasks assessed/performed             Past Medical History:  Diagnosis Date   Arthritis    COLONIC POLYPS, HX OF 09/13/2007   Qualifier: Diagnosis of  By: Linna Darner MD, Gwyndolyn Saxon   Dr Clarene Essex, GI Colonoscopy @ 5 year intervals     DUODENAL ULCER, HX OF 09/13/2007   Qualifier: Diagnosis of  By: Linna Darner MD, Gwyndolyn Saxon     GERD (gastroesophageal reflux disease)    Hyperlipidemia    Hypertension    SKIN CANCER, HX OF 09/01/2009   Qualifier: Diagnosis of  By: Linna Darner MD, Fidela [place] Cell  X 3-4; [place]  [facility]     Ulcer 1989 & 2000   bleedng X2    Past Surgical History:  Procedure Laterality Date   colonoscopy with polypectomy     Dr Watt Climes   EYE SURGERY     post trauma with glss fragments   INGUINAL HERNIA REPAIR  1970   pilonidal cystectomy     TOTAL HIP ARTHROPLASTY Right 12/06/2019   Procedure: RIGHT TOTAL HIP ARTHROPLASTY ANTERIOR APPROACH;  Surgeon: Mcarthur Rossetti, MD;  Location: [facility];  Service: Orthopedics;  Laterality: Right;    There were no vitals filed for this visit.   Subjective Assessment - 09/02/20 1400     Subjective Feels like he is overdoing his exercises.    Pertinent History hx skin CA, HTN, HLD, GERD, R THA 2021    Diagnostic tests 07/15/20 R hip xray: well-seated total hip  arthroplasty on the right side with no complicating features.; 15/40/08 lumbar xray: degenerative changes at multiple levels. There is significant disc space narrowing between L3 and L4.  There is a small spondylolisthesis between L5 and S1.    Patient Stated Goals decrease, return to golf    Currently in Pain? No/denies                               [facility] Adult PT Treatment/Exercise - 09/02/20 0001       Exercises   Exercises Lumbar;Knee/Hip      Lumbar Exercises: Stretches   Standing Extension 10 reps    Standing Extension Limitations 3 sec holds      Lumbar Exercises: Aerobic   Nustep L5x68mn (UEs/LEs)      Lumbar Exercises: Machines for Strengthening   Other Lumbar Machine Exercise straight arm pulldown 10x15#    Other Lumbar Machine Exercise narrow cybex row 30# 2x10   cues to emphasize retraction     Lumbar Exercises: Standing   Other Standing Lumbar Exercises karate chop with green TB 10 reps    Other Standing Lumbar Exercises R/L  paloff press with marches 10 reps each and resisted trunk rotation with green TB 10x each      Lumbar Exercises: Supine   Bridge 10 reps;3 seconds    Bridge Limitations green TB      Knee/Hip Exercises: Standing   Hip Flexion Stengthening;Both;10 reps;Knee straight    Hip Flexion Limitations red loop 1 hand support    Hip Abduction Stengthening;Both;10 reps;Knee straight    Abduction Limitations red loop; 2 hand support    Hip Extension Stengthening;Both;10 reps    Extension Limitations red loop; 1 hand support                      PT Short Term Goals - 08/25/20 1421       PT SHORT TERM GOAL #1   Title Patient to be independent with initial HEP.    Time 3    Period Weeks    Status Achieved    Target Date 08/24/20               PT Long Term Goals - 09/02/20 1403       PT LONG TERM GOAL #1   Title Patient to be independent with advanced HEP.    Period Weeks    Status Partially Met   met for  current     PT LONG TERM GOAL #2   Title Patient to demonstrate B hip strength >/=4+/5.    Time 6    Period Weeks    Status Partially Met   improved in R hip flexion     PT LONG TERM GOAL #3   Title Patient to demonstrate lumbar AROM WFL and without pain limiting.    Time 6    Period Weeks    Status On-going   improved in extension, R sidebending, and L rotation     PT LONG TERM GOAL #4   Title Patient to report tolerance for a full game of golft with pain </=2/10.    Time 6    Period Weeks    Status On-going   has not attempted     PT LONG TERM GOAL #5   Title Patient to report 75% improvement in pain levels.    Time 6    Period Weeks    Status Achieved   reports atleast 90% improvement in pain                  Plan - 09/02/20 1442     Clinical Impression Statement Patient has met his pain goal with him reporting improvement of at least 90% since the start of his POC. He still notes radicular symptoms occasionally with standing for prolonged periods. Administered green TB today to increase resistance exercises to patients needs. After reading his MD note from his last visit, he is expected to finish with his last session of PT next week and transition to HEP. He is independent with his home program mostly and feels that he could continue with it from here on. He demonstrated the exercises today well but required cues with most exercises for decreasing speed and emphasizing muscular control. TC and VC required to emphasize retraction of the scapula during rows. Pt responded well.    Personal Factors and Comorbidities Age;Comorbidity 3+;Fitness;Past/Current Experience;Time since onset of injury/illness/exacerbation    Comorbidities hx skin CA, HTN, HLD, GERD, R THA 2021    PT Frequency 1x / week    PT Duration 6 weeks    PT Treatment/Interventions  ADLs/Self Care Home Management;Cryotherapy;Electrical Stimulation;Moist Heat;Balance training;Therapeutic exercise;Therapeutic  activities;Functional mobility training;Stair training;Gait training;Ultrasound;Neuromuscular re-education;Patient/family education;Manual techniques;Taping;Energy conservation;Dry needling;Passive range of motion    PT Next Visit Plan transition to home program per MD note; goal assessment    Consulted and Agree with Plan of Care Patient             Patient will benefit from skilled therapeutic intervention in order to improve the following deficits and impairments:  Abnormal gait, Hypomobility, Decreased activity tolerance, Pain, Decreased strength, Increased fascial restricitons, Decreased balance, Difficulty walking, Increased muscle spasms, Improper body mechanics, Decreased range of motion, Postural dysfunction  Visit Diagnosis: Chronic bilateral low back pain with right-sided sciatica  Difficulty in walking, not elsewhere classified  Abnormal posture  Muscle weakness (generalized)     Problem List Patient Active Problem List   Diagnosis Date Noted   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   Hyperglycemia 10/24/2019   Unilateral primary osteoarthritis, right hip 10/23/2019   Erectile dysfunction 10/15/2018   Plantar wart 04/06/2018   Essential tremor 04/06/2018   Dyslipidemia 03/26/2018   Essential hypertension 03/23/2018   History of colon polyps 03/23/2018   History of peptic ulcer 03/23/2018   Low back pain 03/23/2018    Braylin L Clark, PTA 09/02/2020, 2:58 PM  [facility] [address]  [address] [place], [place], [address] Phone: [phone]   Fax:  [phone]  Name: [name] MRN: [id number] Date of Birth: 06/20/1945    

## 2020-09-09 ENCOUNTER — Other Ambulatory Visit: Payer: Self-pay

## 2020-09-09 ENCOUNTER — Ambulatory Visit: Payer: Medicare Other | Admitting: Physical Therapy

## 2020-09-09 ENCOUNTER — Encounter: Payer: Self-pay | Admitting: Physical Therapy

## 2020-09-09 DIAGNOSIS — M6281 Muscle weakness (generalized): Secondary | ICD-10-CM | POA: Diagnosis not present

## 2020-09-09 DIAGNOSIS — M5441 Lumbago with sciatica, right side: Secondary | ICD-10-CM | POA: Diagnosis not present

## 2020-09-09 DIAGNOSIS — G8929 Other chronic pain: Secondary | ICD-10-CM | POA: Diagnosis not present

## 2020-09-09 DIAGNOSIS — R293 Abnormal posture: Secondary | ICD-10-CM

## 2020-09-09 DIAGNOSIS — R262 Difficulty in walking, not elsewhere classified: Secondary | ICD-10-CM

## 2020-09-09 NOTE — Therapy (Signed)
[facility] [address]  [place], [place], [address] Phone: [phone]   Fax:  [phone]  Physical Therapy Discharge Summary  Patient Details  Name: [name] MRN: [id number] Date of Birth: Jul 20, 1945 Referring Provider (PT): Jean Rosenthal, MD   Progress Note Reporting Period 08/03/20 to 09/09/20  See note below for Objective Data and Assessment of Progress/Goals.     Encounter Date: 09/09/2020   PT End of Session - 09/09/20 1429     Visit Number 5    Number of Visits 7    Date for PT Re-Evaluation 09/14/20    Authorization Type Medicare & MoO    PT Start Time 6962    PT Stop Time 1427    PT Time Calculation (min) 28 min    Activity Tolerance Patient tolerated treatment well    Behavior During Therapy WFL for tasks assessed/performed             Past Medical History:  Diagnosis Date   Arthritis    COLONIC POLYPS, HX OF 09/13/2007   Qualifier: Diagnosis of  By: Linna Darner MD, Gwyndolyn Saxon   Dr Clarene Essex, GI Colonoscopy @ 5 year intervals     DUODENAL ULCER, HX OF 09/13/2007   Qualifier: Diagnosis of  By: Linna Darner MD, Gwyndolyn Saxon     GERD (gastroesophageal reflux disease)    Hyperlipidemia    Hypertension    SKIN CANCER, HX OF 09/01/2009   Qualifier: Diagnosis of  By: Linna Darner MD, Fidela [place] Cell  X 3-4; [facility]     Ulcer 1989 & 2000   bleedng X2    Past Surgical History:  Procedure Laterality Date   colonoscopy with polypectomy     Dr Watt Climes   EYE SURGERY     post trauma with glss fragments   INGUINAL HERNIA REPAIR  1970   pilonidal cystectomy     TOTAL HIP ARTHROPLASTY Right 12/06/2019   Procedure: RIGHT TOTAL HIP ARTHROPLASTY ANTERIOR APPROACH;  Surgeon: Mcarthur Rossetti, MD;  Location: [facility];  Service: Orthopedics;  Laterality: Right;    There were no vitals filed for this visit.   Subjective Assessment - 09/09/20 1359     Subjective "Doing great." Notes that he  is ready to wrap up with PT today.    Pertinent History hx skin CA, HTN, HLD, GERD, R THA 2021    Diagnostic tests 07/15/20 R hip xray: well-seated total hip arthroplasty on the right side with no complicating features.; 95/28/41 lumbar xray: degenerative changes at multiple levels. There is significant disc space narrowing between L3 and L4.  There is a small spondylolisthesis between L5 and S1.    Patient Stated Goals decrease, return to golf    Currently in Pain? No/denies                [facility] PT Assessment - 09/09/20 1406       Observation/Other Assessments   Focus on Therapeutic Outcomes (FOTO)  Lumbar: 76, Predicted: 65      AROM   Lumbar Flexion floor    Lumbar Extension WFL    Lumbar - Right Side Bend proximal tibia    Lumbar - Left Side Bend proximal tibia    Lumbar - Right Rotation WFL    Lumbar - Left Rotation [facility]      Strength   Right Hip Flexion 4+/5    Right Hip Extension 4+/5    Right Hip External Rotation  4+/5    Right Hip Internal Rotation 5/5    Right Hip ABduction 4+/5    Right Hip ADduction 4+/5    Left Hip Flexion 4+/5    Left Hip Extension 4+/5    Left Hip External Rotation 4+/5    Left Hip Internal Rotation 5/5    Left Hip ABduction 4+/5    Left Hip ADduction 4+/5                           [facility] Adult PT Treatment/Exercise - 09/09/20 0001       Lumbar Exercises: Aerobic   Recumbent Bike L1 x 6 min                    PT Education - 09/09/20 1427     Education Details discussion on increasing challenge with HEP, slow return to golf, and recommended strength training frequency    Person(s) Educated Patient    Methods Explanation;Demonstration;Verbal cues;Tactile cues    Comprehension Verbalized understanding              PT Short Term Goals - 09/09/20 1433       PT SHORT TERM GOAL #1   Title Patient to be independent with initial HEP.    Time 3    Period Weeks    Status Achieved    Target Date  08/24/20               PT Long Term Goals - 09/09/20 1433       PT LONG TERM GOAL #1   Title Patient to be independent with advanced HEP.    Period Weeks    Status Achieved      PT LONG TERM GOAL #2   Title Patient to demonstrate B hip strength >/=4+/5.    Time 6    Period Weeks    Status Achieved      PT LONG TERM GOAL #3   Title Patient to demonstrate lumbar AROM WFL and without pain limiting.    Time 6    Period Weeks    Status Achieved      PT LONG TERM GOAL #4   Title Patient to report tolerance for a full game of golft with pain </=2/10.    Time 6    Period Weeks    Status Partially Met   has not attempted, but reports that he feels he would be able to tolerate golf now     PT LONG TERM GOAL #5   Title Patient to report 75% improvement in pain levels.    Time 6    Period Weeks    Status Achieved   reports atleast 90% improvement in pain                  Plan - 09/09/20 1429     Clinical Impression Statement Patient arrived with report of feeling ready to wrap up with PT at this time as he is doing well. Notes that he has not returned to golf at this time d/t the weather and limited time, but anticipates that he will be able to tolerate it. Hip strength is now atleast 4+/5 in all planes, with particular improvement in B hip extension and IR, R ER and abduction strength. Lumbar AROM is now [facility] and pain-free, with good improvement in extension and R rotation. Reviewed HEP and discussed slow return to golf as well as recommended strength training frequency  for continued fitness. Patient reported understanding of all edu provided today and without complaints at end of session. Patient has met or partially met all goals and is ready for DC.    Personal Factors and Comorbidities Age;Comorbidity 3+;Fitness;Past/Current Experience;Time since onset of injury/illness/exacerbation    Comorbidities hx skin CA, HTN, HLD, GERD, R THA 2021    PT Frequency 1x / week     PT Duration 6 weeks    PT Treatment/Interventions ADLs/Self Care Home Management;Cryotherapy;Electrical Stimulation;Moist Heat;Balance training;Therapeutic exercise;Therapeutic activities;Functional mobility training;Stair training;Gait training;Ultrasound;Neuromuscular re-education;Patient/family education;Manual techniques;Taping;Energy conservation;Dry needling;Passive range of motion    PT Next Visit Plan DC at this time    Consulted and Agree with Plan of Care Patient             Patient will benefit from skilled therapeutic intervention in order to improve the following deficits and impairments:  Abnormal gait, Hypomobility, Decreased activity tolerance, Pain, Decreased strength, Increased fascial restricitons, Decreased balance, Difficulty walking, Increased muscle spasms, Improper body mechanics, Decreased range of motion, Postural dysfunction  Visit Diagnosis: Chronic bilateral low back pain with right-sided sciatica  Difficulty in walking, not elsewhere classified  Abnormal posture  Muscle weakness (generalized)     Problem List Patient Active Problem List   Diagnosis Date Noted   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   Hyperglycemia 10/24/2019   Unilateral primary osteoarthritis, right hip 10/23/2019   Erectile dysfunction 10/15/2018   Plantar wart 04/06/2018   Essential tremor 04/06/2018   Dyslipidemia 03/26/2018   Essential hypertension 03/23/2018   History of colon polyps 03/23/2018   History of peptic ulcer 03/23/2018   Low back pain 03/23/2018    PHYSICAL THERAPY DISCHARGE SUMMARY  Visits from Start of Care: 5  Current functional level related to goals / functional outcomes: See above clinical impression   Remaining deficits: none   Education / Equipment: HEP  Plan: Patient agrees to discharge.  Patient goals were partially met. Patient is being discharged due to meeting the stated rehab goals.      Janene Harvey, PT,  DPT 09/09/20 2:36 PM   [facility] [place] [facility] [address]  [place], [place], [address] Phone: [phone]   Fax:  [phone]  Name: [name] MRN: [id number] Date of Birth: 1945/06/09

## 2020-10-22 DIAGNOSIS — L821 Other seborrheic keratosis: Secondary | ICD-10-CM | POA: Diagnosis not present

## 2020-10-22 DIAGNOSIS — L720 Epidermal cyst: Secondary | ICD-10-CM | POA: Diagnosis not present

## 2020-10-22 DIAGNOSIS — L738 Other specified follicular disorders: Secondary | ICD-10-CM | POA: Diagnosis not present

## 2020-10-22 DIAGNOSIS — D1801 Hemangioma of skin and subcutaneous tissue: Secondary | ICD-10-CM | POA: Diagnosis not present

## 2020-10-22 DIAGNOSIS — Z85828 Personal history of other malignant neoplasm of skin: Secondary | ICD-10-CM | POA: Diagnosis not present

## 2020-10-22 DIAGNOSIS — D225 Melanocytic nevi of trunk: Secondary | ICD-10-CM | POA: Diagnosis not present

## 2020-10-22 DIAGNOSIS — L570 Actinic keratosis: Secondary | ICD-10-CM | POA: Diagnosis not present

## 2020-10-29 ENCOUNTER — Other Ambulatory Visit: Payer: Self-pay | Admitting: Family Medicine

## 2020-10-29 ENCOUNTER — Ambulatory Visit (INDEPENDENT_AMBULATORY_CARE_PROVIDER_SITE_OTHER): Payer: Medicare Other | Admitting: Family Medicine

## 2020-10-29 ENCOUNTER — Other Ambulatory Visit: Payer: Self-pay

## 2020-10-29 ENCOUNTER — Encounter: Payer: Self-pay | Admitting: Family Medicine

## 2020-10-29 VITALS — BP 139/80 | HR 79 | Temp 98.3°F | Ht 72.0 in | Wt 196.4 lb

## 2020-10-29 DIAGNOSIS — I10 Essential (primary) hypertension: Secondary | ICD-10-CM

## 2020-10-29 DIAGNOSIS — R739 Hyperglycemia, unspecified: Secondary | ICD-10-CM | POA: Diagnosis not present

## 2020-10-29 DIAGNOSIS — N529 Male erectile dysfunction, unspecified: Secondary | ICD-10-CM | POA: Diagnosis not present

## 2020-10-29 DIAGNOSIS — G250 Essential tremor: Secondary | ICD-10-CM | POA: Diagnosis not present

## 2020-10-29 DIAGNOSIS — E785 Hyperlipidemia, unspecified: Secondary | ICD-10-CM

## 2020-10-29 LAB — COMPREHENSIVE METABOLIC PANEL
ALT: 14 U/L (ref 0–53)
AST: 13 U/L (ref 0–37)
Albumin: 4.2 g/dL (ref 3.5–5.2)
Alkaline Phosphatase: 102 U/L (ref 39–117)
BUN: 10 mg/dL (ref 6–23)
CO2: 30 mEq/L (ref 19–32)
Calcium: 11.5 mg/dL — ABNORMAL HIGH (ref 8.4–10.5)
Chloride: 106 mEq/L (ref 96–112)
Creatinine, Ser: 1.1 mg/dL (ref 0.40–1.50)
GFR: 66.92 mL/min (ref >60.00)
Glucose, Bld: 101 mg/dL — ABNORMAL HIGH (ref 70–99)
Potassium: 3.8 mEq/L (ref 3.5–5.1)
Sodium: 142 mEq/L (ref 135–145)
Total Bilirubin: 0.6 mg/dL (ref 0.2–1.2)
Total Protein: 7.4 g/dL (ref 6.0–8.3)

## 2020-10-29 LAB — LIPID PANEL
Cholesterol: 129 mg/dL (ref 0–200)
HDL: 34.8 mg/dL — ABNORMAL LOW (ref >39.00)
LDL Cholesterol: 61 mg/dL (ref 0–99)
NonHDL: 94.2
Total CHOL/HDL Ratio: 4
Triglycerides: 168 mg/dL — ABNORMAL HIGH (ref 0.0–149.0)
VLDL: 33.6 mg/dL (ref 0.0–40.0)

## 2020-10-29 LAB — CBC
HCT: 46.6 % (ref 39.0–52.0)
Hemoglobin: 15.5 g/dL (ref 13.0–17.0)
MCHC: 33.3 g/dL (ref 30.0–36.0)
MCV: 93.6 fl (ref 78.0–100.0)
Platelets: 231 10*3/uL (ref 150.0–400.0)
RBC: 4.98 Mil/uL (ref 4.22–5.81)
RDW: 14.3 % (ref 11.5–15.5)
WBC: 7.5 10*3/uL (ref 4.0–10.5)

## 2020-10-29 LAB — HEMOGLOBIN A1C: Hgb A1c MFr Bld: 5.7 % (ref 4.6–6.5)

## 2020-10-29 LAB — TSH: TSH: 2.54 u[IU]/mL (ref 0.35–5.50)

## 2020-10-29 MED ORDER — AMLODIPINE BESYLATE 10 MG PO TABS
10.0000 mg | ORAL_TABLET | Freq: Every day | ORAL | 3 refills | Status: DC
Start: 2020-10-29 — End: 2022-01-03

## 2020-10-29 MED ORDER — ATORVASTATIN CALCIUM 40 MG PO TABS
40.0000 mg | ORAL_TABLET | Freq: Every day | ORAL | 3 refills | Status: DC
Start: 2020-10-29 — End: 2021-12-08

## 2020-10-29 NOTE — Progress Notes (Signed)
   KHALEEM SOGGE is a 75 y.o. male who presents today for an office visit.  Assessment/Plan:  Chronic Problems Addressed Today: Hyperglycemia Check A1c.  Erectile dysfunction Stable on Cialis 20 mg daily.  Essential tremor Symptoms are overall stable.  Discussed pharmacologic treatment however deferred for today.  We will continue with watchful waiting.  He will let me know if symptoms worsen.  Dyslipidemia Continue Lipitor 40 mg daily.  Check labs today.  Essential hypertension At goal.  Continue Lipitor 40 mg daily.  Preventative health care Deferred colonoscopy today.  Up-to-date on pneumonia vaccine.  We will check labs today.    Subjective:  HPI:  He has no acute complaint today. See A/p for status of chronic conditions.        Objective:  Physical Exam: BP 139/80   Pulse 79   Temp 98.3 F (36.8 C) (Temporal)   Ht 6' (1.829 m)   Wt 196 lb 6.4 oz (89.1 kg)   SpO2 95%   BMI 26.64 kg/m   Gen: No acute distress, resting comfortably CV: Regular rate and rhythm with no murmurs appreciated Pulm: Normal work of breathing, clear to auscultation bilaterally with no crackles, wheezes, or rhonchi Neuro: Grossly normal, moves all extremities Psych: Normal affect and thought content       I,Savera Zaman,acting as a scribe for Dimas Chyle, MD.,have documented all relevant documentation on the behalf of Dimas Chyle, MD,as directed by  Dimas Chyle, MD while in the presence of Dimas Chyle, MD.   I, Dimas Chyle, MD, have reviewed all documentation for this visit. The documentation on 10/29/20 for the exam, diagnosis, procedures, and orders are all accurate and complete.  Algis Greenhouse. Jerline Pain, MD 10/29/2020 1:53 PM

## 2020-10-29 NOTE — Patient Instructions (Signed)
It was very nice to see you today!  We will refill your medications today.  We will check blood work.  Please let me know when you want to get your colonoscopy.  I will see back in year for your next checkup.  Please come back to see me sooner if needed.  Take care, Dr Jerline Pain  PLEASE NOTE:  If you had any lab tests please let us know if you have not heard back within a few days. You may see your results on mychart before we have a chance to review them but we will give you a call once they are reviewed by [place]. If we ordered any referrals today, please let us know if you have not heard from their office within the next week.   Please try these tips to maintain a healthy lifestyle:  Eat at least 3 REAL meals and 1-2 snacks per day.  Aim for no more than 5 hours between eating.  If you eat breakfast, please do so within one hour of getting up.   Each meal should contain half fruits/vegetables, one quarter protein, and one quarter carbs (no bigger than a computer mouse)  Cut down on sweet beverages. This includes juice, soda, and sweet tea.   Drink at least 1 glass of water with each meal and aim for at least 8 glasses per day  Exercise at least 150 minutes every week.    Preventive Care 64 Years and Older, Male Preventive care refers to lifestyle choices and visits with your health care provider that can promote health and wellness. This includes: A yearly physical exam. This is also called an annual wellness visit. Regular dental and eye exams. Immunizations. Screening for certain conditions. Healthy lifestyle choices, such as: Eating a healthy diet. Getting regular exercise. Not using drugs or products that contain nicotine and tobacco. Limiting alcohol use. What can I expect for my preventive care visit? Physical exam Your health care provider will check your: Height and weight. These may be used to calculate your BMI (body mass index). BMI is a measurement that tells if you  are at a healthy weight. Heart rate and blood pressure. Body temperature. Skin for abnormal spots. Counseling Your health care provider may ask you questions about your: Past medical problems. Family's medical history. Alcohol, tobacco, and drug use. Emotional well-being. Home life and relationship well-being. Sexual activity. Diet, exercise, and sleep habits. History of falls. Memory and ability to understand (cognition). Work and work Statistician. Access to firearms. What immunizations do I need?  Vaccines are usually given at various ages, according to a schedule. Your health care provider will recommend vaccines for you based on your age, medicalhistory, and lifestyle or other factors, such as travel or where you work. What tests do I need? Blood tests Lipid and cholesterol levels. These may be checked every 5 years, or more often depending on your overall health. Hepatitis C test. Hepatitis B test. Screening Lung cancer screening. You may have this screening every year starting at age 37 if you have a 30-pack-year history of smoking and currently smoke or have quit within the past 15 years. Colorectal cancer screening. All adults should have this screening starting at age 53 and continuing until age 27. Your health care provider may recommend screening at age 4 if you are at increased risk. You will have tests every 1-10 years, depending on your results and the type of screening test. Prostate cancer screening. Recommendations will vary depending on your family  history and other risks. Genital exam to check for testicular cancer or hernias. Diabetes screening. This is done by checking your blood sugar (glucose) after you have not eaten for a while (fasting). You may have this done every 1-3 years. Abdominal aortic aneurysm (AAA) screening. You may need this if you are a current or former smoker. STD (sexually transmitted disease) testing, if you are at risk. Follow these  instructions at home: Eating and drinking  Eat a diet that includes fresh fruits and vegetables, whole grains, lean protein, and low-fat dairy products. Limit your intake of foods with high amounts of sugar, saturated fats, and salt. Take vitamin and mineral supplements as recommended by your health care provider. Do not drink alcohol if your health care provider tells you not to drink. If you drink alcohol: Limit how much you have to 0-2 drinks a day. Be aware of how much alcohol is in your drink. In the [place] one drink equals one 12 oz bottle of beer (355 mL), one 5 oz glass of wine (148 mL), or one 1 oz glass of hard liquor (44 mL).  Lifestyle Take daily care of your teeth and gums. Brush your teeth every morning and night with fluoride toothpaste. Floss one time each day. Stay active. Exercise for at least 30 minutes 5 or more days each week. Do not use any products that contain nicotine or tobacco, such as cigarettes, e-cigarettes, and chewing tobacco. If you need help quitting, ask your health care provider. Do not use drugs. If you are sexually active, practice safe sex. Use a condom or other form of protection to prevent STIs (sexually transmitted infections). Talk with your health care provider about taking a low-dose aspirin or statin. Find healthy ways to cope with stress, such as: Meditation, yoga, or listening to music. Journaling. Talking to a trusted person. Spending time with friends and family. Safety Always wear your seat belt while driving or riding in a vehicle. Do not drive: If you have been drinking alcohol. Do not ride with someone who has been drinking. When you are tired or distracted. While texting. Wear a helmet and other protective equipment during sports activities. If you have firearms in your house, make sure you follow all gun safety procedures. What's next? Visit your health care provider once a year for an annual wellness visit. Ask your health care  provider how often you should have your eyes and teeth checked. Stay up to date on all vaccines. This information is not intended to replace advice given to you by your health care provider. Make sure you discuss any questions you have with your healthcare provider. Document Revised: 11/27/2018 Document Reviewed: 02/22/2018 Elsevier Patient Education  2022 [employer].

## 2020-10-29 NOTE — Assessment & Plan Note (Signed)
Check A1c. 

## 2020-10-29 NOTE — Assessment & Plan Note (Signed)
Stable on Cialis 20 mg daily.

## 2020-10-29 NOTE — Assessment & Plan Note (Signed)
Symptoms are overall stable.  Discussed pharmacologic treatment however deferred for today.  We will continue with watchful waiting.  He will let me know if symptoms worsen.

## 2020-10-29 NOTE — Assessment & Plan Note (Signed)
Continue Lipitor 40 mg daily.  Check labs today.

## 2020-10-29 NOTE — Assessment & Plan Note (Signed)
At goal.  Continue Lipitor 40 mg daily.

## 2020-10-30 NOTE — Progress Notes (Signed)
Please inform patient of the following:  His calcium levels are high.  Looks like he has been persistently elevated for the past year or so.  Please ask him to come back to recheck ionized calcium and parathyroid levels.  Please place future orders.  His A1c and cholesterol borderline but stable.  Do not need to make any medication changes however she should continue working on diet and exercise.  Everything else is normal.  We can recheck in a year.

## 2020-11-02 ENCOUNTER — Telehealth: Payer: Self-pay

## 2020-11-02 ENCOUNTER — Other Ambulatory Visit: Payer: Self-pay

## 2020-11-02 NOTE — Telephone Encounter (Signed)
Pt returned a call. He believes the call was about his labs. Please Advise.

## 2020-11-02 NOTE — Progress Notes (Signed)
Labs placed.

## 2020-11-04 NOTE — Telephone Encounter (Signed)
Spoke with patient, lab results given See results note

## 2020-11-05 ENCOUNTER — Other Ambulatory Visit: Payer: Self-pay

## 2020-11-05 ENCOUNTER — Other Ambulatory Visit: Payer: Medicare Other

## 2020-11-05 IMAGING — DX DG PORTABLE PELVIS
1 series · 1 of 1 positions shown · non-contrast
Comparison: 10/23/2019

CLINICAL DATA: Right hip arthroplasty

EXAM:
PORTABLE PELVIS 1-2 VIEWS

[pelvis ap]
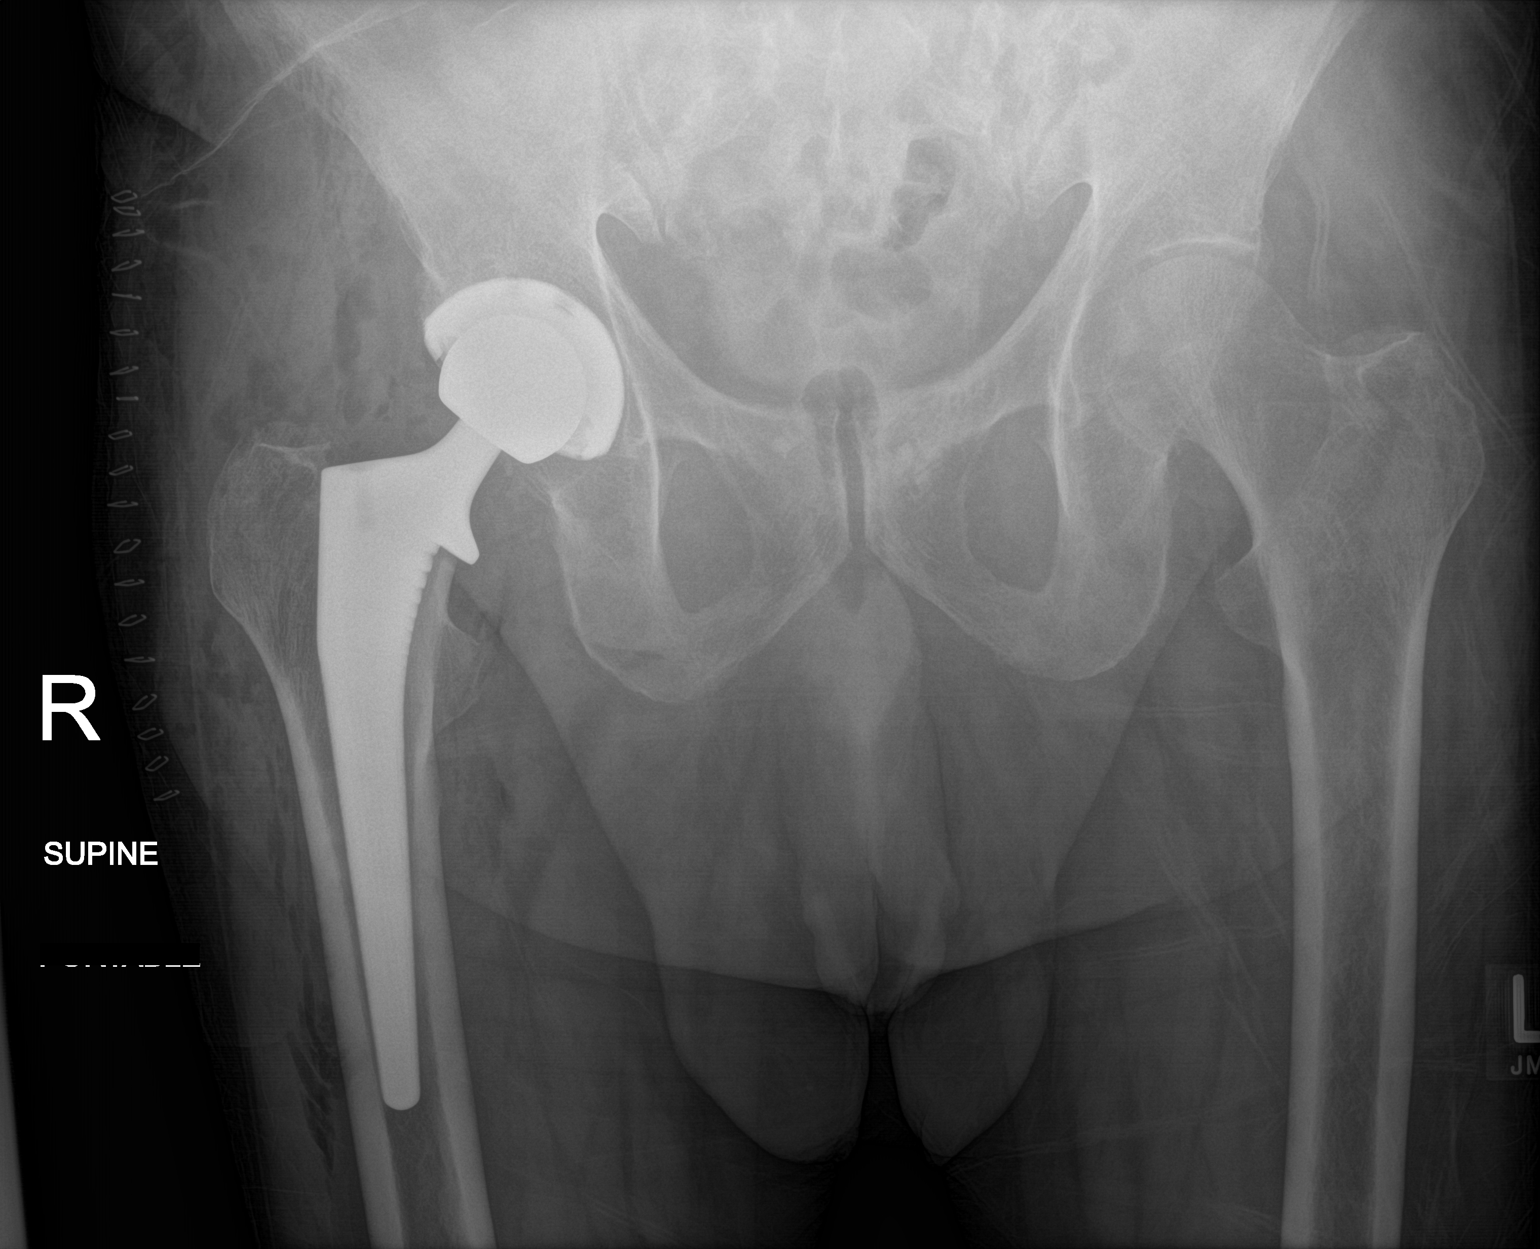

[1 of 1 positions shown; findings below may reference images not displayed]

FINDINGS: Interval postsurgical changes from right total hip arthroplasty.
Arthroplasty components are in their expected alignment without
periprosthetic fracture or other complication. Expected
postoperative changes to the overlying soft tissues.
IMPRESSION: Satisfactory postoperative appearance status post right total hip
arthroplasty.

## 2020-11-05 IMAGING — RF DG HIP (WITH PELVIS) OPERATIVE*R*
1 series · 3 of 3 positions shown · non-contrast
Comparison: 10/23/2019

CLINICAL DATA: Right hip arthroplasty

EXAM:
OPERATIVE RIGHT HIP (WITH PELVIS IF PERFORMED) 3 fluoroscopic VIEWS
TECHNIQUE: Fluoroscopic spot image(s) were submitted for interpretation
post-operatively.

[Series 1: unknown protocol · 0.20mm/px · 3 of 3 slices shown]
[im 1/3]
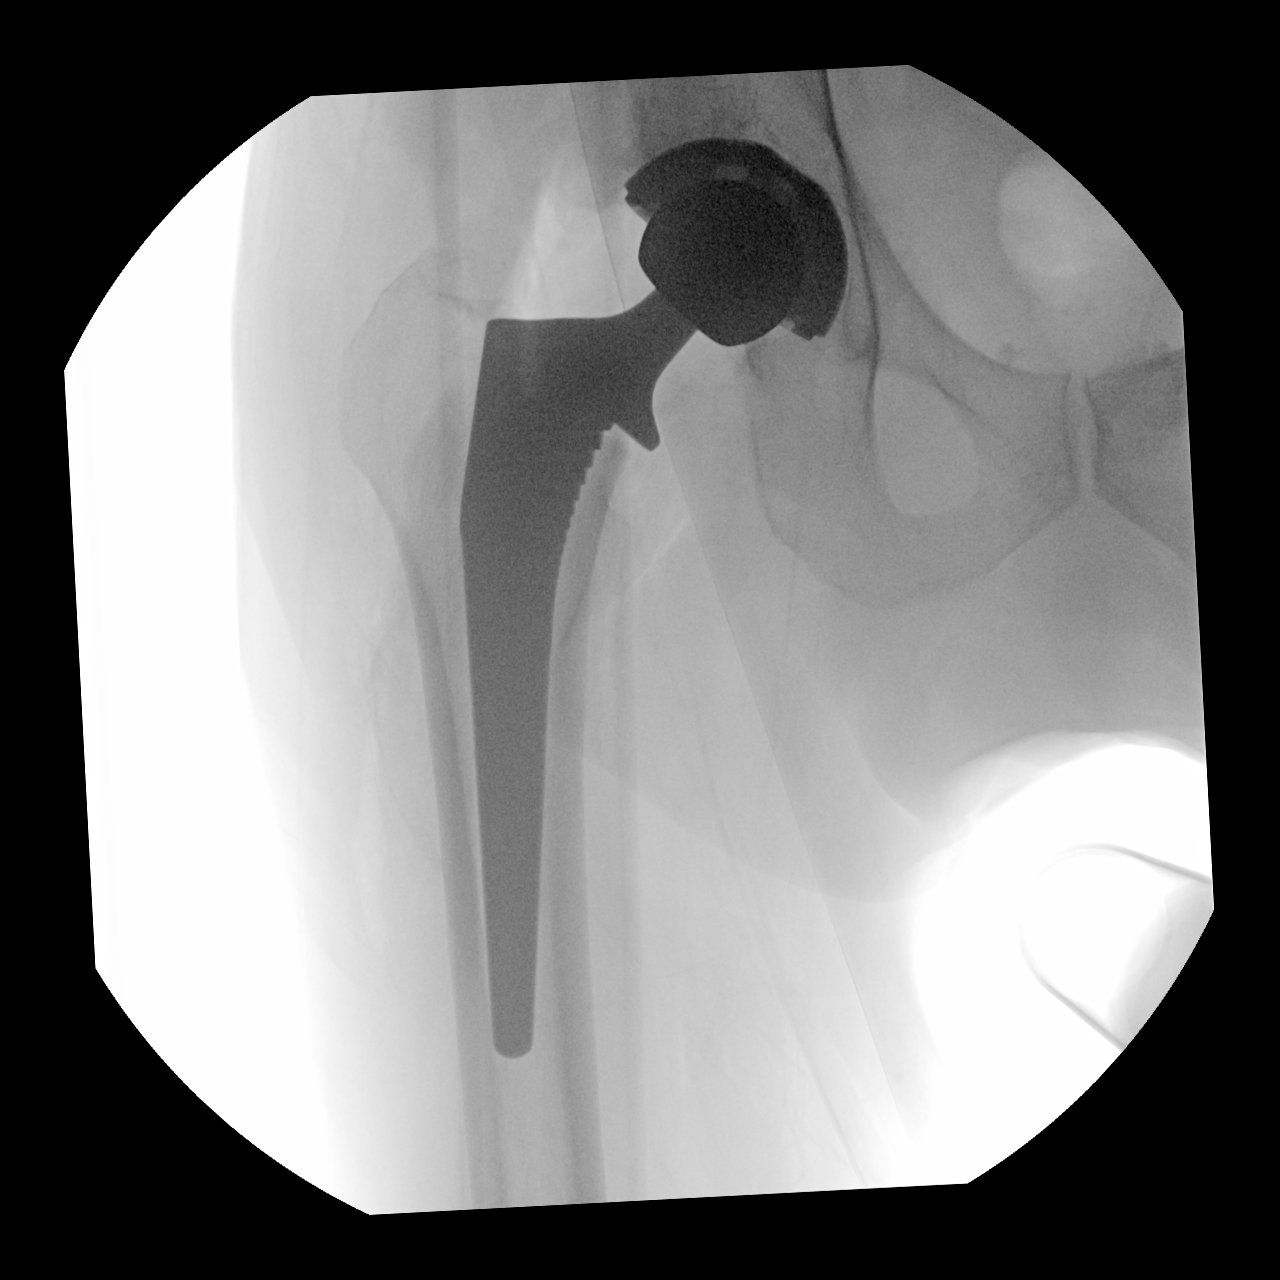
[im 2/3]
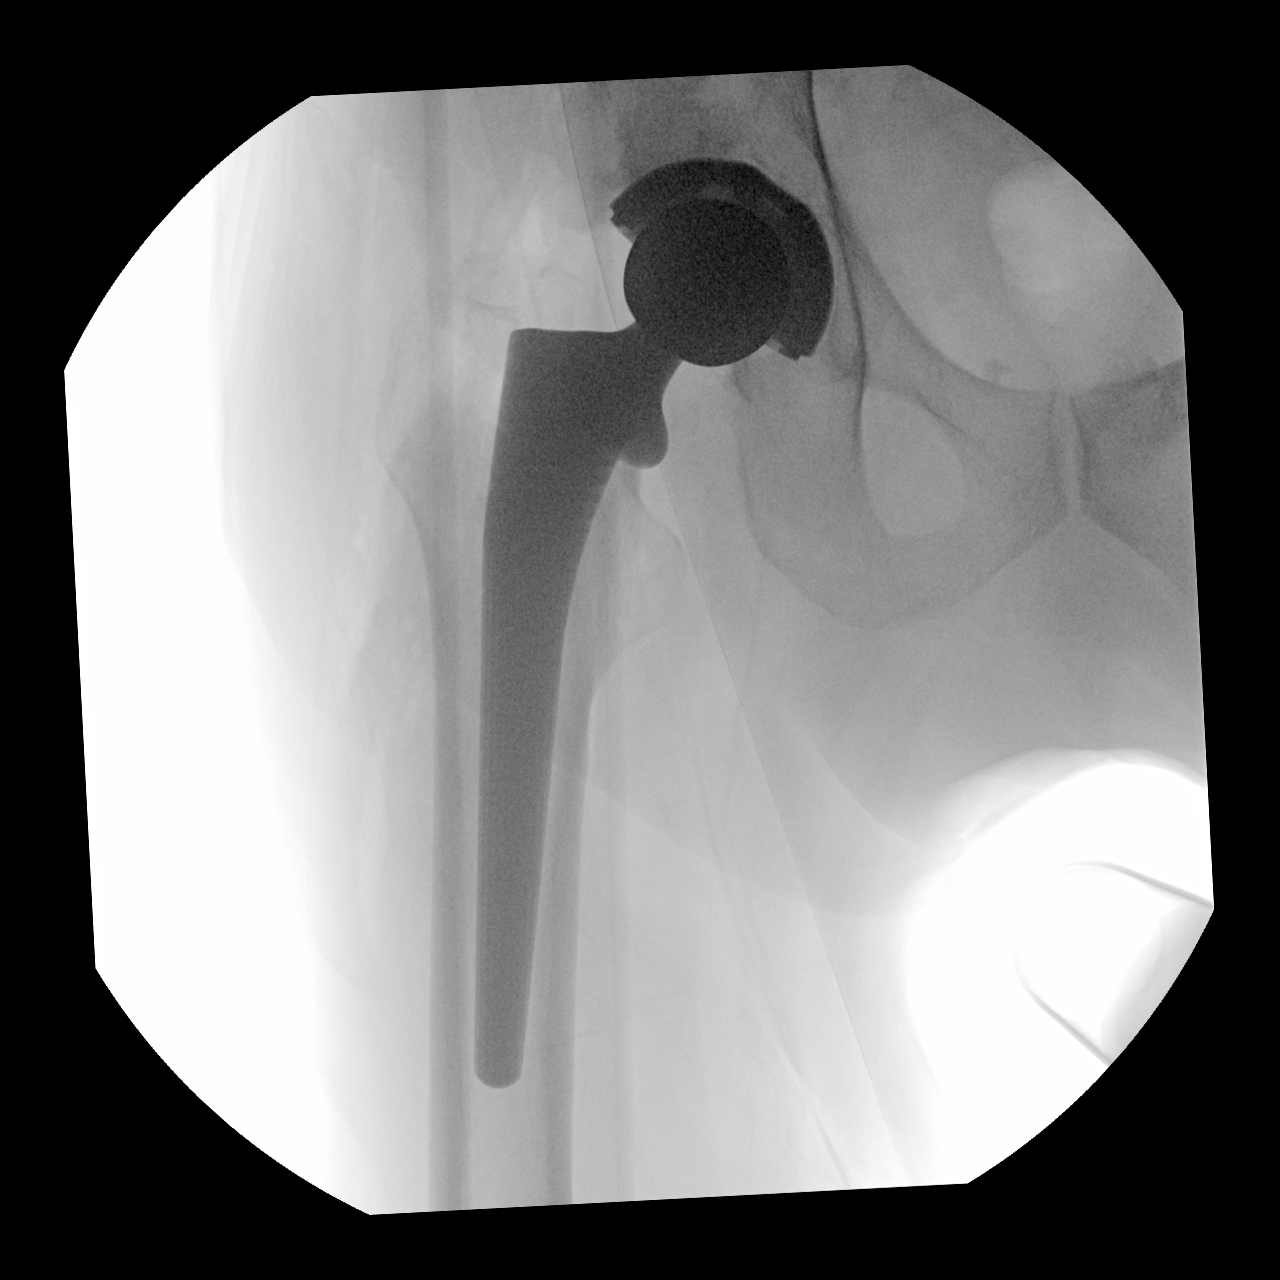
[im 3/3]
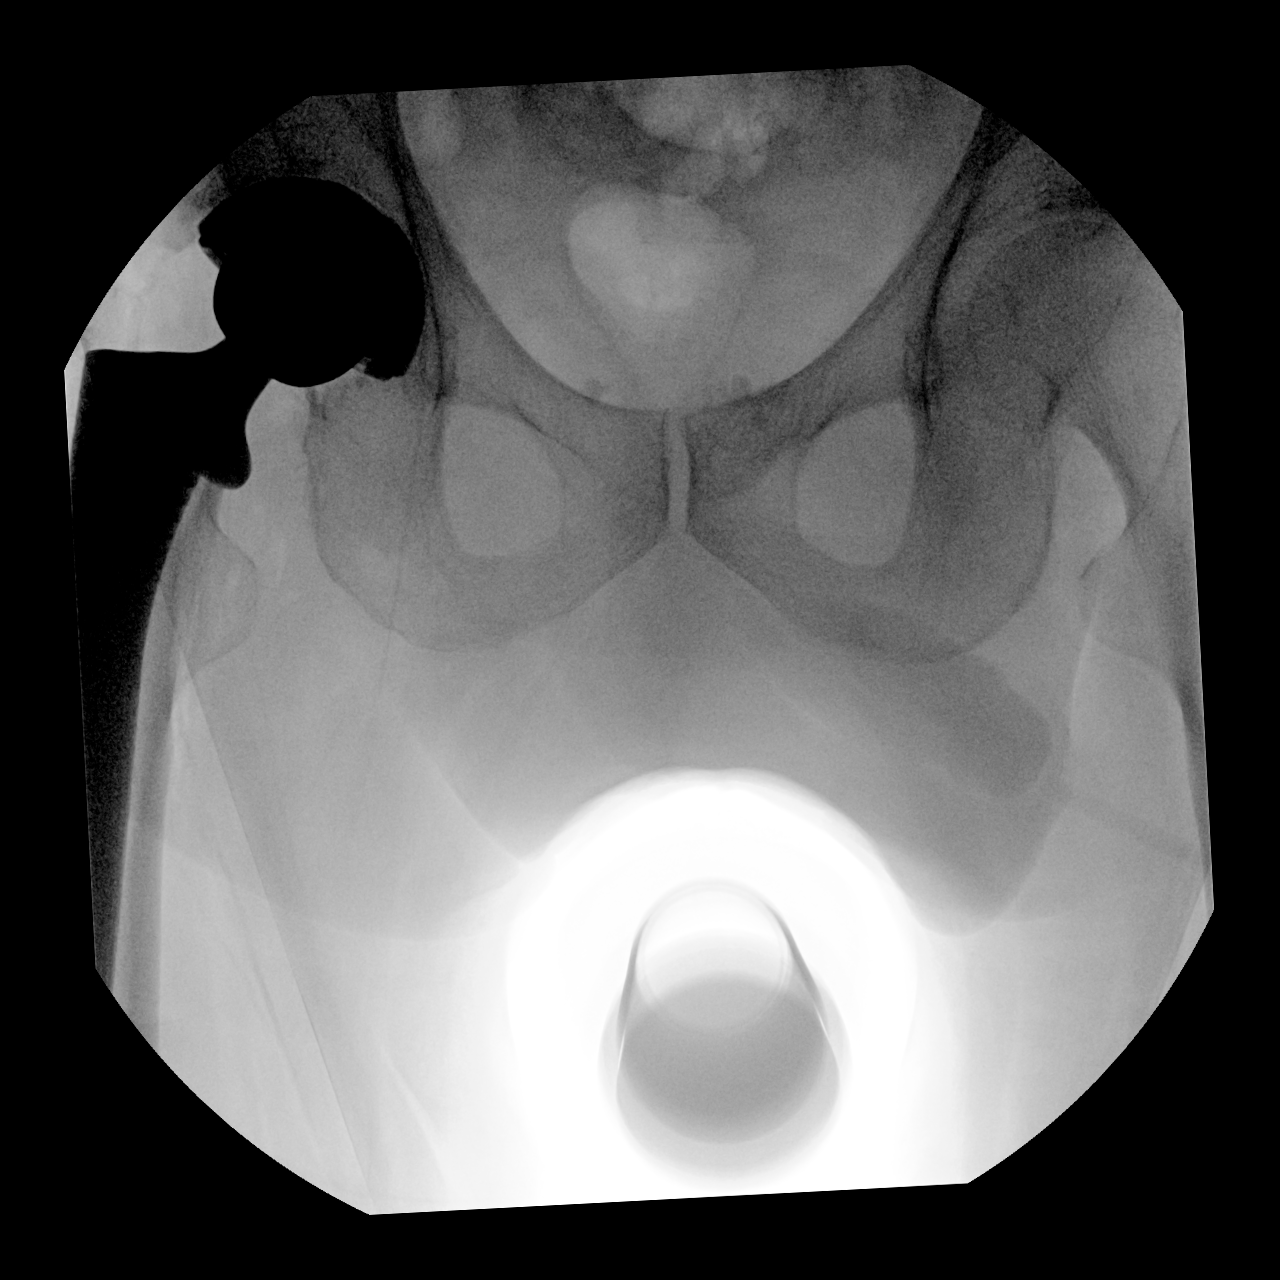

[3 of 3 positions shown; findings below may reference images not displayed]

FINDINGS: Spot fluoroscopic views demonstrate bipolar hip arthroplasty.
Components appear aligned in the frontal plane. Expected
postoperative changes. No acute hardware or osseous abnormality.
IMPRESSION: Expected appearance status post right hip arthroplasty.

## 2020-11-06 LAB — CALCIUM, IONIZED: Calcium, Ion: 6.2 mg/dL — ABNORMAL HIGH (ref 4.8–5.6)

## 2020-11-06 LAB — PARATHYROID HORMONE, INTACT (NO CA): PTH: 55 pg/mL (ref 16–77)

## 2020-11-10 NOTE — Progress Notes (Signed)
Please inform patient of the following:  Calcium levels are high.  He may have hyperparathyroidism.  This is not an urgent concern though we probably need to have him see an endocrinologist to take a closer look.  Please place referral to endocrinology for elevated calcium.

## 2020-11-12 ENCOUNTER — Other Ambulatory Visit: Payer: Self-pay

## 2020-11-12 ENCOUNTER — Telehealth: Payer: Self-pay

## 2020-11-12 DIAGNOSIS — E213 Hyperparathyroidism, unspecified: Secondary | ICD-10-CM

## 2020-11-12 NOTE — Telephone Encounter (Signed)
Called and spoke with pt regarding labs.

## 2020-11-12 NOTE — Telephone Encounter (Signed)
Patient returned call regarding lab results

## 2020-11-21 ENCOUNTER — Other Ambulatory Visit: Payer: Self-pay | Admitting: Family Medicine

## 2020-12-25 ENCOUNTER — Telehealth: Payer: Self-pay | Admitting: *Deleted

## 2020-12-25 NOTE — Telephone Encounter (Signed)
1 year Ortho bundle call completed. ?

## 2020-12-29 ENCOUNTER — Ambulatory Visit (INDEPENDENT_AMBULATORY_CARE_PROVIDER_SITE_OTHER): Payer: Medicare Other

## 2020-12-29 ENCOUNTER — Other Ambulatory Visit: Payer: Self-pay

## 2020-12-29 DIAGNOSIS — Z23 Encounter for immunization: Secondary | ICD-10-CM

## 2021-01-22 ENCOUNTER — Ambulatory Visit (INDEPENDENT_AMBULATORY_CARE_PROVIDER_SITE_OTHER): Payer: Medicare Other | Admitting: Internal Medicine

## 2021-01-22 ENCOUNTER — Encounter: Payer: Self-pay | Admitting: Internal Medicine

## 2021-01-22 ENCOUNTER — Ambulatory Visit
Admission: RE | Admit: 2021-01-22 | Discharge: 2021-01-22 | Disposition: A | Discharge status: Home / Self Care | Payer: Medicare Other | Source: Ambulatory Visit | Attending: Internal Medicine | Admitting: Internal Medicine

## 2021-01-22 ENCOUNTER — Other Ambulatory Visit: Payer: Self-pay | Admitting: Internal Medicine

## 2021-01-22 ENCOUNTER — Other Ambulatory Visit: Payer: Self-pay

## 2021-01-22 VITALS — BP 130/82 | HR 88 | Ht 72.0 in | Wt 204.0 lb

## 2021-01-22 DIAGNOSIS — N200 Calculus of kidney: Secondary | ICD-10-CM

## 2021-01-22 DIAGNOSIS — E210 Primary hyperparathyroidism: Secondary | ICD-10-CM

## 2021-01-22 DIAGNOSIS — M47816 Spondylosis without myelopathy or radiculopathy, lumbar region: Secondary | ICD-10-CM | POA: Diagnosis not present

## 2021-01-22 LAB — BASIC METABOLIC PANEL
BUN: 14 mg/dL (ref 6–23)
CO2: 30 mEq/L (ref 19–32)
Calcium: 11.2 mg/dL — ABNORMAL HIGH (ref 8.4–10.5)
Chloride: 107 mEq/L (ref 96–112)
Creatinine, Ser: 1.09 mg/dL (ref 0.40–1.50)
GFR: 67.55 mL/min (ref >60.00)
Glucose, Bld: 109 mg/dL — ABNORMAL HIGH (ref 70–99)
Potassium: 3.8 mEq/L (ref 3.5–5.1)
Sodium: 142 mEq/L (ref 135–145)

## 2021-01-22 LAB — VITAMIN D 25 HYDROXY (VIT D DEFICIENCY, FRACTURES): VITD: 75.12 ng/mL (ref 30.00–100.00)

## 2021-01-22 LAB — ALBUMIN: Albumin: 4.3 g/dL (ref 3.5–5.2)

## 2021-01-22 NOTE — Progress Notes (Signed)
Name: CORDAY WYKA  MRN/ DOB: [id number], 1945-08-19    Age/ Sex: 75 y.o., male    PCP: Vivi Barrack, MD   Reason for Endocrinology Evaluation: Hypercalcemia      Date of Initial Endocrinology Evaluation: 01/22/2021     HPI: [name] is a 75 y.o. male with a past medical history of HTN and Dyslipidemia. The patient presented for initial endocrinology clinic visit on 01/22/2021 for consultative assistance with his Hypercalcemia .   Mr. Schippers indicates that he was first diagnosed with hypercalcemia in 2014, with a serum max of 11.8 mg/dL ( noncorrected) and inappropriately normal PTH    Since that time, he denies  experienced symptoms of constipation, polyuria, polydipsia.  He denies  use of over the counter calcium (including supplements, Tums, Rolaids, or other calcium containing antacids), lithium, or HCTZ  He is on  vitamin D 5000 iu daily   He denies  history of kidney stones, kidney disease, liver disease, granulomatous disease. He denies  osteoporosis or prior fractures. Daily dietary calcium intake: 0 servings. He denies  family history of osteoporosis, parathyroid disease, thyroid disease.    HISTORY:  Past Medical History:  Past Medical History:  Diagnosis Date   Arthritis    COLONIC POLYPS, HX OF 09/13/2007   Qualifier: Diagnosis of  By: Linna Darner MD, Gwyndolyn Saxon   Dr Clarene Essex, GI Colonoscopy @ 5 year intervals     DUODENAL ULCER, HX OF 09/13/2007   Qualifier: Diagnosis of  By: Linna Darner MD, Gwyndolyn Saxon     GERD (gastroesophageal reflux disease)    Hyperlipidemia    Hypertension    SKIN CANCER, HX OF 09/01/2009   Qualifier: Diagnosis of  By: Linna Darner MD, Fidela Juneau Cell  X 3-4; [facility]     Ulcer 1989 & 2000   bleedng X2   Past Surgical History:  Past Surgical History:  Procedure Laterality Date   colonoscopy with polypectomy     Dr Watt Climes   EYE SURGERY     post trauma with glss fragments   INGUINAL HERNIA REPAIR  1970   pilonidal  cystectomy     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 He has quit using smokeless tobacco.  His smokeless tobacco use included chew. He reports that he does not drink alcohol and does not use drugs. Family History: family history includes Diabetes in his brother; Hyperlipidemia in his brother; Hypertension in his brother; Stroke in his mother.   HOME MEDICATIONS: Allergies as of 01/22/2021       Reactions   Ibuprofen    Hx of bleeding ulcer   Choline Fenofibrate Other (See Comments)   ABDOMINAL PAIN, CONSTIPATION        Medication List        Accurate as of January 22, 2021  1:42 PM. If you have any questions, ask your nurse or doctor.          amLODipine 10 MG tablet Commonly known as: NORVASC Take 1 tablet (10 mg total) by mouth daily.   atorvastatin 40 MG tablet Commonly known as: LIPITOR Take 1 tablet (40 mg total) by mouth daily.   QUERCETIN PO Take 500 mg by mouth daily.   tadalafil 20 MG tablet Commonly known as: CIALIS TAKE  1 TABLET BY MOUTH AS NEEDED FOR ERECTILE DYSFUNCTION   VITAMIN C PO Take 1,650 mg by mouth daily.   Vitamin D3 125 MCG (5000 UT) Tabs Take 5,000 Units by mouth daily.   vitamin k 100 MCG tablet Take 150 mcg by mouth daily.   Zinc 50 MG Caps Take 30 mg by mouth daily.          REVIEW OF SYSTEMS: A comprehensive ROS was conducted with the patient and is negative except as per HPI     OBJECTIVE:  VS: BP 130/82 (BP Location: Left Arm, Patient Position: Sitting, Cuff Size: Small)   Pulse 88   Ht 6' (1.829 m)   Wt 204 lb (92.5 kg)   SpO2 95%   BMI 27.67 kg/m    Wt Readings from Last 3 Encounters:  01/22/21 204 lb (92.5 kg)  10/29/20 196 lb 6.4 oz (89.1 kg)  03/26/20 197 lb 9.6 oz (89.6 kg)      EXAM: General: Pt appears well and is in NAD  Neck: General: Supple without adenopathy. Thyroid: Thyroid size normal.  No goiter or nodules appreciated. No thyroid bruit.  Lungs: Clear with good BS bilat with no rales, rhonchi, or wheezes  Heart: Auscultation: RRR.  Abdomen: Normoactive bowel sounds, soft, nontender, without masses or organomegaly palpable  Extremities:  BL LE: No pretibial edema normal ROM and strength.  Skin: Hair: Texture and amount normal with gender appropriate distribution Skin Inspection: No rashes Skin Palpation: Skin temperature, texture, and thickness normal to palpation  Neuro: Cranial nerves: II - XII grossly intact  Motor: Normal strength throughout DTRs: 2+ and symmetric in UE without delay in relaxation phase  Mental Status: Judgment, insight: Intact Orientation: Oriented to time, place, and person Mood and affect: No depression, anxiety, or agitation     DATA REVIEWED:   Results for MARCUS, SCHWANDT (MRN [id number]) as of 01/25/2021 12:44  Ref. Range 01/22/2021 14:06  Sodium Latest Ref Range: 135 - 145 mEq/L 142  Potassium Latest Ref Range: 3.5 - 5.1 mEq/L 3.8  Chloride Latest Ref Range: 96 - 112 mEq/L 107  CO2 Latest Ref Range: 19 - 32 mEq/L 30  Glucose Latest Ref Range: 70 - 99 mg/dL 109 (H)  BUN Latest Ref Range: 6 - 23 mg/dL 14  Creatinine Latest Ref Range: 0.40 - 1.50 mg/dL 1.09  Calcium Latest Ref Range: 8.4 - 10.5 mg/dL 11.2 (H)  Calcium Ionized Latest Ref Range: 4.8 - 5.6 mg/dL 6.09 (H)  Albumin Latest Ref Range: 3.5 - 5.2 g/dL 4.3  GFR Latest Ref Range: >60.00 mL/min 67.55  VITD Latest Ref Range: 30.00 - 100.00 ng/mL 75.12  PTH, Intact Latest Ref Range: 16 - 77 pg/mL 103 (H)    ASSESSMENT/PLAN/RECOMMENDATIONS:   PTH Mediated Hypercalcemia :  Patient needs further evaluation to determine surgical candidacy - Encouraged hydration  - AVOID CALCIUM SUPPLEMENTS, AVOID LOW CALCIUM DIET - Maintain normal dietary calcium  intake (2-3 servings of dairy a day) - DXA to rule out Osteoporosis - KUB  to r/o nephrolithiasis or nephrocalcinosis.  - Decrease Vitamin D to 3000 iu daily   F/U in 4 months   Signed electronically by: Mack Guise, MD  [facility] Endocrinology  [address], [place] [address] Phone: [phone] FAX: [phone]   CC: Vivi Barrack, [address] [place] [address] Phone: [phone] Fax: [phone]   Return to Endocrinology clinic as below: Future Appointments  Date Time Provider  [place]  04/01/2021  1:00 PM [facility]  11/04/2021  2:00 PM Vivi Barrack, MD [facility]

## 2021-01-22 NOTE — Patient Instructions (Signed)
-   Encouraged hydration  - AVOID CALCIUM SUPPLEMENTS, AVOID LOW CALCIUM DIET - Maintain normal dietary calcium intake (2-3 servings of dairy a day)   Bone density will be done at [facility] office  located at [address] [employer]

## 2021-01-25 ENCOUNTER — Other Ambulatory Visit: Payer: Self-pay

## 2021-01-25 ENCOUNTER — Ambulatory Visit (INDEPENDENT_AMBULATORY_CARE_PROVIDER_SITE_OTHER)
Admission: RE | Admit: 2021-01-25 | Discharge: 2021-01-25 | Disposition: A | Discharge status: Home / Self Care | Payer: Medicare Other | Source: Ambulatory Visit | Attending: Internal Medicine | Admitting: Internal Medicine

## 2021-01-25 DIAGNOSIS — E210 Primary hyperparathyroidism: Secondary | ICD-10-CM

## 2021-01-25 LAB — PARATHYROID HORMONE, INTACT (NO CA): PTH: 103 pg/mL — ABNORMAL HIGH (ref 16–77)

## 2021-01-25 LAB — CALCIUM, IONIZED: Calcium, Ion: 6.09 mg/dL — ABNORMAL HIGH (ref 4.8–5.6)

## 2021-03-22 ENCOUNTER — Telehealth: Payer: Self-pay | Admitting: Family Medicine

## 2021-03-22 NOTE — Telephone Encounter (Signed)
Copied from [place] [id number]. Topic: Medicare AWV >> Mar 22, 2021 10:37 AM Harris-Coley, Hannah Beat wrote: Reason for CRM: LVM 03/22/21 to r/s AWV 04/01/21 appt [facility] ([facility] out of office) Left message for patient to reschedule Annual Wellness Visit.  Please schedule with Nurse Health Advisor Charlott Rakes, RN at [facility].  Please call [phone] ask for [facility]

## 2021-04-01 ENCOUNTER — Ambulatory Visit: Payer: Medicare Other

## 2021-04-08 ENCOUNTER — Other Ambulatory Visit: Payer: Self-pay

## 2021-04-08 ENCOUNTER — Ambulatory Visit (INDEPENDENT_AMBULATORY_CARE_PROVIDER_SITE_OTHER): Payer: Medicare Other

## 2021-04-08 DIAGNOSIS — Z0000 Encounter for general adult medical examination without abnormal findings: Secondary | ICD-10-CM | POA: Diagnosis not present

## 2021-04-08 NOTE — Patient Instructions (Signed)
[name] , Thank you for taking time to come for your Medicare Wellness Visit. I appreciate your ongoing commitment to your health goals. Please review the following plan we discussed and let me know if I can assist you in the future.   Screening recommendations/referrals: Colonoscopy: pt stated he will make appt  Recommended yearly ophthalmology/optometry visit for glaucoma screening and checkup Recommended yearly dental visit for hygiene and checkup  Vaccinations: Influenza vaccine: Done 12/29/20 repeat every year  Pneumococcal vaccine: Up to date Tdap vaccine: Due and discussed  Shingles vaccine: Completed 02/15/17, 03/24/17   Covid-19: Completed 4/12, 5/3, & 03/05/20  Advanced directives: Please bring a copy of your health care power of attorney and living will to the office at your convenience.  Conditions/risks identified: keep living and stay healthy  Next appointment: Follow up in one year for your annual wellness visit.   Preventive Care 74 Years and Older, Male Preventive care refers to lifestyle choices and visits with your health care provider that can promote health and wellness. What does preventive care include? A yearly physical exam. This is also called an annual well check. Dental exams once or twice a year. Routine eye exams. Ask your health care provider how often you should have your eyes checked. Personal lifestyle choices, including: Daily care of your teeth and gums. Regular physical activity. Eating a healthy diet. Avoiding tobacco and drug use. Limiting alcohol use. Practicing safe sex. Taking low doses of aspirin every day. Taking vitamin and mineral supplements as recommended by your health care provider. What happens during an annual well check? The services and screenings done by your health care provider during your annual well check will depend on your age, overall health, lifestyle risk factors, and family history of disease. Counseling  Your  health care provider may ask you questions about your: Alcohol use. Tobacco use. Drug use. Emotional well-being. Home and relationship well-being. Sexual activity. Eating habits. History of falls. Memory and ability to understand (cognition). Work and work Statistician. Screening  You may have the following tests or measurements: Height, weight, and BMI. Blood pressure. Lipid and cholesterol levels. These may be checked every 5 years, or more frequently if you are over 23 years old. Skin check. Lung cancer screening. You may have this screening every year starting at age 74 if you have a 30-pack-year history of smoking and currently smoke or have quit within the past 15 years. Fecal occult blood test (FOBT) of the stool. You may have this test every year starting at age 74. Flexible sigmoidoscopy or colonoscopy. You may have a sigmoidoscopy every 5 years or a colonoscopy every 10 years starting at age 74. Prostate cancer screening. Recommendations will vary depending on your family history and other risks. Hepatitis C blood test. Hepatitis B blood test. Sexually transmitted disease (STD) testing. Diabetes screening. This is done by checking your blood sugar (glucose) after you have not eaten for a while (fasting). You may have this done every 1-3 years. Abdominal aortic aneurysm (AAA) screening. You may need this if you are a current or former smoker. Osteoporosis. You may be screened starting at age 74 if you are at high risk. Talk with your health care provider about your test results, treatment options, and if necessary, the need for more tests. Vaccines  Your health care provider may recommend certain vaccines, such as: Influenza vaccine. This is recommended every year. Tetanus, diphtheria, and acellular pertussis (Tdap, Td) vaccine. You may need a Td booster every 10  years. Zoster vaccine. You may need this after age 13. Pneumococcal 13-valent conjugate (PCV13) vaccine. One dose  is recommended after age 74. Pneumococcal polysaccharide (PPSV23) vaccine. One dose is recommended after age 74. Talk to your health care provider about which screenings and vaccines you need and how often you need them. This information is not intended to replace advice given to you by your health care provider. Make sure you discuss any questions you have with your health care provider. Document Released: 03/27/2015 Document Revised: 11/18/2015 Document Reviewed: 12/30/2014 Elsevier Interactive Patient Education  2017 [place] Prevention in the Home Falls can cause injuries. They can happen to people of all ages. There are many things you can do to make your home safe and to help prevent falls. What can I do on the outside of my home? Regularly fix the edges of walkways and driveways and fix any cracks. Remove anything that might make you trip as you walk through a door, such as a raised step or threshold. Trim any bushes or trees on the path to your home. Use bright outdoor lighting. Clear any walking paths of anything that might make someone trip, such as rocks or tools. Regularly check to see if handrails are loose or broken. Make sure that both sides of any steps have handrails. Any raised decks and porches should have guardrails on the edges. Have any leaves, snow, or ice cleared regularly. Use sand or salt on walking paths during winter. Clean up any spills in your garage right away. This includes oil or grease spills. What can I do in the bathroom? Use night lights. Install grab bars by the toilet and in the tub and shower. Do not use towel bars as grab bars. Use non-skid mats or decals in the tub or shower. If you need to sit down in the shower, use a plastic, non-slip stool. Keep the floor dry. Clean up any water that spills on the floor as soon as it happens. Remove soap buildup in the tub or shower regularly. Attach bath mats securely with double-sided non-slip rug  tape. Do not have throw rugs and other things on the floor that can make you trip. What can I do in the bedroom? Use night lights. Make sure that you have a light by your bed that is easy to reach. Do not use any sheets or blankets that are too big for your bed. They should not hang down onto the floor. Have a firm chair that has side arms. You can use this for support while you get dressed. Do not have throw rugs and other things on the floor that can make you trip. What can I do in the kitchen? Clean up any spills right away. Avoid walking on wet floors. Keep items that you use a lot in easy-to-reach places. If you need to reach something above you, use a strong step stool that has a grab bar. Keep electrical cords out of the way. Do not use floor polish or wax that makes floors slippery. If you must use wax, use non-skid floor wax. Do not have throw rugs and other things on the floor that can make you trip. What can I do with my stairs? Do not leave any items on the stairs. Make sure that there are handrails on both sides of the stairs and use them. Fix handrails that are broken or loose. Make sure that handrails are as long as the stairways. Check any carpeting to make sure  that it is firmly attached to the stairs. Fix any carpet that is loose or worn. Avoid having throw rugs at the top or bottom of the stairs. If you do have throw rugs, attach them to the floor with carpet tape. Make sure that you have a light switch at the top of the stairs and the bottom of the stairs. If you do not have them, ask someone to add them for you. What else can I do to help prevent falls? Wear shoes that: Do not have high heels. Have rubber bottoms. Are comfortable and fit you well. Are closed at the toe. Do not wear sandals. If you use a stepladder: Make sure that it is fully opened. Do not climb a closed stepladder. Make sure that both sides of the stepladder are locked into place. Ask someone to  hold it for you, if possible. Clearly mark and make sure that you can see: Any grab bars or handrails. First and last steps. Where the edge of each step is. Use tools that help you move around (mobility aids) if they are needed. These include: Canes. Walkers. Scooters. Crutches. Turn on the lights when you go into a dark area. Replace any light bulbs as soon as they burn out. Set up your furniture so you have a clear path. Avoid moving your furniture around. If any of your floors are uneven, fix them. If there are any pets around you, be aware of where they are. Review your medicines with your doctor. Some medicines can make you feel dizzy. This can increase your chance of falling. Ask your doctor what other things that you can do to help prevent falls. This information is not intended to replace advice given to you by your health care provider. Make sure you discuss any questions you have with your health care provider. Document Released: 12/25/2008 Document Revised: 08/06/2015 Document Reviewed: 04/04/2014 Elsevier Interactive Patient Education  2017 [employer].

## 2021-04-08 NOTE — Progress Notes (Addendum)
Virtual Visit via Telephone Note  I connected with  [name] on 04/08/21 at  1:45 PM EST by telephone and verified that I am speaking with the correct person using two identifiers.  Medicare Annual Wellness visit completed telephonically due to Covid-19 pandemic.   Persons participating in this call: This Health Coach and this patient.   Location: Patient: Home Provider: Office    I discussed the limitations, risks, security and privacy concerns of performing an evaluation and management service by telephone and the availability of in person appointments. The patient expressed understanding and agreed to proceed.  Unable to perform video visit due to video visit attempted and failed and/or patient does not have video capability.   Some vital signs may be absent or patient reported.   Steven Brace, LPN   Subjective:   [name] is a 76 y.o. male who presents for Medicare Annual/Subsequent preventive examination.  Review of Systems     Cardiac Risk Factors include: advanced age (>20men, >42 women);dyslipidemia;male gender;hypertension     Objective:    There were no vitals filed for this visit. There is no height or weight on file to calculate BMI.  Advanced Directives 04/08/2021 08/03/2020 03/26/2020 12/06/2019 11/29/2019 02/05/2019  Does Patient Have a Medical Advance Directive? Yes Yes Yes Yes Yes Yes  Type of Advance Directive Healthcare Power of [place]  Does patient want to make changes to medical advance directive? - No - Patient declined - No - Patient declined - No - Patient declined  Copy of [place] in Chart? No - copy requested - No - copy requested - - No - copy requested    Current Medications (verified) Outpatient Encounter Medications as of 04/08/2021  Medication Sig   amLODipine (NORVASC) 10 MG tablet Take 1 tablet (10 mg total) by mouth daily.    Ascorbic Acid (VITAMIN C PO) Take 1,650 mg by mouth daily.   atorvastatin (LIPITOR) 40 MG tablet Take 1 tablet (40 mg total) by mouth daily.   Cholecalciferol (VITAMIN D3) 125 MCG (5000 UT) TABS Take 5,000 Units by mouth daily.   tadalafil (CIALIS) 20 MG tablet TAKE 1 TABLET BY MOUTH AS NEEDED FOR ERECTILE DYSFUNCTION   Zinc 50 MG CAPS Take 30 mg by mouth daily.   QUERCETIN PO Take 500 mg by mouth daily.   vitamin k 100 MCG tablet Take 150 mcg by mouth daily.   No facility-administered encounter medications on file as of 04/08/2021.    Allergies (verified) Ibuprofen and Choline fenofibrate   History: Past Medical History:  Diagnosis Date   Arthritis    COLONIC POLYPS, HX OF 09/13/2007   Qualifier: Diagnosis of  By: Linna Darner MD, Gwyndolyn Saxon   Dr Clarene Essex, GI Colonoscopy @ 5 year intervals     DUODENAL ULCER, HX OF 09/13/2007   Qualifier: Diagnosis of  By: Linna Darner MD, Gwyndolyn Saxon     GERD (gastroesophageal reflux disease)    Hyperlipidemia    Hypertension    SKIN CANCER, HX OF 09/01/2009   Qualifier: Diagnosis of  By: Linna Darner MD, Fidela Juneau Cell  X 3-4; [place]  [facility]     Ulcer 1989 & 2000   bleedng X2   Past Surgical History:  Procedure Laterality Date   colonoscopy with polypectomy     Dr Watt Climes   EYE SURGERY     post trauma with glss fragments   INGUINAL HERNIA  REPAIR  1970   pilonidal cystectomy     TOTAL HIP ARTHROPLASTY Right 12/06/2019   Procedure: RIGHT TOTAL HIP ARTHROPLASTY ANTERIOR APPROACH;  Surgeon: Mcarthur Rossetti, MD;  Location: [facility];  Service: Orthopedics;  Laterality: Right;   Family History  Problem Relation Age of Onset   Stroke Mother        late 72s   Diabetes Brother        Type II   Hypertension Brother    Hyperlipidemia Brother    Heart disease Neg Hx    Cancer Neg Hx    Social History   Socioeconomic History   Marital status: Married    Spouse name: Not on file   Number of children: 2   Years of education: 16   Highest education  level: Not on file  Occupational History   Occupation: Retired   Tobacco Use   Smoking status: Former   Smokeless tobacco: Former    Types: Nurse, children's Use: Never used  Substance and Sexual Activity   Alcohol use: No   Drug use: No   Sexual activity: Not on file  Other Topics Concern   Not on file  Social History Narrative   Not on file   Social Determinants of Health   Financial Resource Strain: Low Risk    Difficulty of Paying Living Expenses: Not hard at all  Food Insecurity: No Food Insecurity   Worried About Charity fundraiser in the Last Year: Never true   Arboriculturist in the Last Year: Never true  Transportation Needs: No Transportation Needs   Lack of Transportation (Medical): No   Lack of Transportation (Non-Medical): No  Physical Activity: Inactive   Days of Exercise per Week: 0 days   Minutes of Exercise per Session: 0 min  Stress: No Stress Concern Present   Feeling of Stress : Not at all  Social Connections: Unknown   Frequency of Communication with Friends and Family: Twice a week   Frequency of Social Gatherings with Friends and Family: Not on file   Attends Religious Services: More than 4 times per year   Active Member of [employer] or Organizations: Yes   Attends Archivist Meetings: 1 to 4 times per year   Marital Status: Married    Tobacco Counseling Counseling given: Not Answered   Clinical Intake:  Pre-visit preparation completed: Yes  Pain : No/denies pain     BMI - recorded: 27.67 Nutritional Status: BMI 25 -29 Overweight Nutritional Risks: None Diabetes: No  How often do you need to have someone help you when you read instructions, pamphlets, or other written materials from your doctor or pharmacy?: 1 - Never  Diabetic?no  Interpreter Needed?: No  Information entered by :: Charlott Rakes, LPN   Activities of Daily Living In your present state of health, do you have any difficulty performing the  following activities: 04/08/2021  Hearing? N  Vision? N  Difficulty concentrating or making decisions? N  Walking or climbing stairs? N  Dressing or bathing? N  Doing errands, shopping? N  Preparing Food and eating ? N  Using the Toilet? N  In the past six months, have you accidently leaked urine? N  Do you have problems with loss of bowel control? N  Managing your Medications? N  Managing your Finances? N  Housekeeping or managing your Housekeeping? N  Some recent data might be hidden    Patient Care Team: Jerline Pain,  Algis Greenhouse, MD as PCP - General (Family Medicine)  Indicate any recent Medical Services you may have received from other than [facility] providers in the past year (date may be approximate).     Assessment:   This is a routine wellness examination for E[place].  Hearing/Vision screen Hearing Screening - Comments:: Pt denies any hearing issues  Vision Screening - Comments:: Pt follows up with Dr Ronnald Ramp for annual eye exams   Dietary issues and exercise activities discussed: Current Exercise Habits: The patient does not participate in regular exercise at present   Goals Addressed             This Visit's Progress    Patient Stated       Keep living and stay healthy       Depression Screen PHQ 2/9 Scores 04/08/2021 10/29/2020 03/26/2020 10/24/2019 02/05/2019 04/18/2012  PHQ - 2 Score 0 0 0 0 0 0    Fall Risk Fall Risk  04/08/2021 10/29/2020 03/26/2020 10/24/2019 02/05/2019  Falls in the past year? - 0 0 0 0  Number falls in past yr: 0 0 0 - -  Injury with Fall? 0 0 0 - 0  Risk for fall due to : Impaired vision No Fall Risks Impaired vision - Impaired mobility  Follow up Falls prevention discussed - Falls prevention discussed - Education provided;Falls prevention discussed;Falls evaluation completed    FALL RISK PREVENTION PERTAINING TO THE HOME:  Any stairs in or around the home? Yes  If so, are there any without handrails? No  Home free of loose throw rugs in  walkways, pet beds, electrical cords, etc? Yes  Adequate lighting in your home to reduce risk of falls? Yes   ASSISTIVE DEVICES UTILIZED TO PREVENT FALLS:  Life alert? No  Use of a cane, walker or w/c? No  Grab bars in the bathroom? Yes  Shower chair or bench in shower? Yes  Elevated toilet seat or a handicapped toilet? No   TIMED UP AND GO:  Was the test performed? No .  Cognitive Function:     6CIT Screen 04/08/2021 03/26/2020  What Year? 0 points 0 points  What month? 0 points 0 points  What time? 0 points -  Count back from 20 0 points 0 points  Months in reverse 0 points 0 points  Repeat phrase 0 points 4 points  Total Score 0 -    Immunizations Immunization History  Administered Date(s) Administered   Fluad Quad(high Dose 65+) 12/19/2018, 12/31/2019, 12/29/2020   HPV Quadrivalent 12/13/2017   Influenza Whole 12/13/2011   Influenza, High Dose Seasonal PF 12/26/2013, 12/24/2015, 12/16/2016, 12/15/2017   Influenza-Unspecified 01/01/2015   PFIZER(Purple Top)SARS-COV-2 Vaccination 06/24/2019, 07/15/2019, 03/05/2020   Pneumococcal Conjugate-13 01/12/2017   Pneumococcal Polysaccharide-23 12/12/2016   Td 09/01/2009   Zoster Recombinat (Shingrix) 02/15/2017, 03/24/2017    TDAP status: Due, Education has been provided regarding the importance of this vaccine. Advised may receive this vaccine at local pharmacy or Health Dept. Aware to provide a copy of the vaccination record if obtained from local pharmacy or Health Dept. Verbalized acceptance and understanding.  Flu Vaccine status: Up to date  Pneumococcal vaccine status: Up to date  Covid-19 vaccine status: Completed vaccines  Qualifies for Shingles Vaccine? Yes   Zostavax completed Yes   Shingrix Completed?: Yes  Screening Tests Health Maintenance  Topic Date Due   COLONOSCOPY (Pts 45-73yrs Insurance coverage will need to be confirmed)  Never done   TETANUS/TDAP  09/02/2019   COVID-19 Vaccine (  4 - Booster for  [employer] series) 04/30/2020   Pneumonia Vaccine 66+ Years old  Completed   INFLUENZA VACCINE  Completed   Hepatitis C Screening  Completed   Zoster Vaccines- Shingrix  Completed   HPV VACCINES  Aged Out    Health Maintenance  Health Maintenance Due  Topic Date Due   COLONOSCOPY (Pts 45-53yrs Insurance coverage will need to be confirmed)  Never done   TETANUS/TDAP  09/02/2019   COVID-19 Vaccine (4 - Booster for [place] series) 04/30/2020    Colonoscopy pt stated he will make appt    Additional Screening:  Hepatitis C Screening:  Completed 03/23/18  Vision Screening: Recommended annual ophthalmology exams for early detection of glaucoma and other disorders of the eye. Is the patient up to date with their annual eye exam?  Yes  Who is the provider or what is the name of the office in which the patient attends annual eye exams? Dr Ronnald Ramp  If pt is not established with a provider, would they like to be referred to a provider to establish care? No .   Dental Screening: Recommended annual dental exams for proper oral hygiene  Community Resource Referral / Chronic Care Management: CRR required this visit?  No   CCM required this visit?  No      Plan:     I have personally reviewed and noted the following in the patients chart:   Medical and social history Use of alcohol, tobacco or illicit drugs  Current medications and supplements including opioid prescriptions. Patient is not currently taking opioid prescriptions. Functional ability and status Nutritional status Physical activity Advanced directives List of other physicians Hospitalizations, surgeries, and ER visits in previous 12 months Vitals Screenings to include cognitive, depression, and falls Referrals and appointments  In addition, I have reviewed and discussed with patient certain preventive protocols, quality metrics, and best practice recommendations. A written personalized care plan for preventive services as  well as general preventive health recommendations were provided to patient.     Steven Brace, LPN   1/61/0960   Nurse Notes: None

## 2021-04-13 ENCOUNTER — Telehealth (INDEPENDENT_AMBULATORY_CARE_PROVIDER_SITE_OTHER): Payer: Medicare Other | Admitting: Family Medicine

## 2021-04-13 ENCOUNTER — Other Ambulatory Visit: Payer: Self-pay

## 2021-04-13 VITALS — Ht 72.0 in | Wt 204.0 lb

## 2021-04-13 DIAGNOSIS — I10 Essential (primary) hypertension: Secondary | ICD-10-CM | POA: Diagnosis not present

## 2021-04-13 DIAGNOSIS — E785 Hyperlipidemia, unspecified: Secondary | ICD-10-CM | POA: Diagnosis not present

## 2021-04-13 MED ORDER — MOLNUPIRAVIR 200 MG PO CAPS: 4.0000 | ORAL_CAPSULE | Freq: Two times a day (BID) | ORAL | 0 refills | Status: AC

## 2021-04-13 NOTE — Assessment & Plan Note (Signed)
Well controlled on amlodipine 10 mg daily.

## 2021-04-13 NOTE — Assessment & Plan Note (Signed)
On Lipitor 40 mg daily.  We will avoid paxlovid.

## 2021-04-13 NOTE — Progress Notes (Signed)
° °  [name] is a 76 y.o. male who presents today for a virtual office visit.  Assessment/Plan:  New/Acute Problems: Exposure to COVID-19 Thankfully not showing any symptoms.  Tests have been negative.  Discussed with patient we do not have any prophylactic treatment though we will send in a prescription for molnupiravir to use if he starts develop symptoms.  He is agreeable to this.  Chronic Problems Addressed Today: Dyslipidemia On Lipitor 40 mg daily.  We will avoid paxlovid.  Essential hypertension Well-controlled on amlodipine 10 mg daily.     Subjective:  HPI:  Patient here with covid exposure. Wife was recently diagnosed with covid this morning. He is not currently having symptoms.        Objective/Observations  Physical Exam: Gen: NAD, resting comfortably Pulm: Normal work of breathing Neuro: Grossly normal, moves all extremities Psych: Normal affect and thought content  Virtual Visit via Video   I connected with [name] on 04/13/21 at  3:40 PM EST by a video enabled telemedicine application and verified that I am speaking with the correct person using two identifiers. The limitations of evaluation and management by telemedicine and the availability of in person appointments were discussed. The patient expressed understanding and agreed to proceed.   Patient location: Home Provider location: [place] participating in the virtual visit: Myself and Patient     Algis Greenhouse. Jerline Pain, MD 04/13/2021 3:16 PM

## 2021-05-13 DIAGNOSIS — U071 COVID-19: Secondary | ICD-10-CM | POA: Diagnosis not present

## 2021-05-24 ENCOUNTER — Encounter: Payer: Self-pay | Admitting: Internal Medicine

## 2021-05-24 ENCOUNTER — Ambulatory Visit (INDEPENDENT_AMBULATORY_CARE_PROVIDER_SITE_OTHER): Payer: Medicare Other | Admitting: Internal Medicine

## 2021-05-24 ENCOUNTER — Other Ambulatory Visit: Payer: Self-pay

## 2021-05-24 VITALS — BP 120/80 | HR 92 | Ht 72.0 in | Wt 196.8 lb

## 2021-05-24 DIAGNOSIS — E210 Primary hyperparathyroidism: Secondary | ICD-10-CM | POA: Diagnosis not present

## 2021-05-24 LAB — BASIC METABOLIC PANEL
BUN: 20 mg/dL (ref 6–23)
CO2: 26 mEq/L (ref 19–32)
Calcium: 11.6 mg/dL — ABNORMAL HIGH (ref 8.4–10.5)
Chloride: 105 mEq/L (ref 96–112)
Creatinine, Ser: 1.19 mg/dL (ref 0.40–1.50)
GFR: 60.65 mL/min (ref >60.00)
Glucose, Bld: 91 mg/dL (ref 70–99)
Potassium: 4.4 mEq/L (ref 3.5–5.1)
Sodium: 140 mEq/L (ref 135–145)

## 2021-05-24 LAB — ALBUMIN: Albumin: 4.5 g/dL (ref 3.5–5.2)

## 2021-05-24 LAB — VITAMIN D 25 HYDROXY (VIT D DEFICIENCY, FRACTURES): VITD: 62.61 ng/mL (ref 30.00–100.00)

## 2021-05-24 NOTE — Patient Instructions (Signed)
-   Encouraged hydration  ?- AVOID CALCIUM SUPPLEMENTS, AVOID LOW CALCIUM DIET ?- Maintain normal dietary calcium intake (2-3 servings of low fat dairy a day) ? ? ? ? ?24-Hour Urine Collection ? ?You will be collecting your urine for a 24-hour period of time. ?Your timer starts with your first urine of the morning (For example - If you first pee at [place], your timer will start at [place]) ?Throw away your first urine of the morning ?Collect your urine every time you pee for the next 24 hours ?STOP your urine collection 24 hours after you started the collection (For example - You would stop at 9AM the day after you started) ? ? ? ?

## 2021-05-24 NOTE — Progress Notes (Unsigned)
Name: [name]  MRN/ DOB: [id number], 09/16/1945    Age/ Sex: 76 y.o., male    PCP: Vivi Barrack, MD   Reason for Endocrinology Evaluation: Hypercalcemia      Date of Initial Endocrinology Evaluation: 01/22/2021    HPI: [name] is a 76 y.o. male with a past medical history of HTN and Dyslipidemia. The patient presented for initial endocrinology clinic visit on 01/22/2021 for consultative assistance with his Hypercalcemia .   [name] indicates that he was first diagnosed with hypercalcemia in 2014, with a serum max of 11.8 mg/dL ( noncorrected) and inappropriately normal PTH     He denies  history of kidney stones, kidney disease, liver disease, granulomatous disease.   DXA: Low bone density 01/2021 KUB: No renal stones 01/2021   SUBJECTIVE:    Today (05/24/21):  [name] is here for a follow up on hyperparathyroidism/Hypercalcemia.     Vitamin D 5000 iu  every other day      HISTORY:  Past Medical History:  Past Medical History:  Diagnosis Date   Arthritis    COLONIC POLYPS, HX OF 09/13/2007   Qualifier: Diagnosis of  By: Linna Darner MD, Gwyndolyn Saxon   Dr Clarene Essex, GI Colonoscopy @ 5 year intervals     DUODENAL ULCER, HX OF 09/13/2007   Qualifier: Diagnosis of  By: Linna Darner MD, Gwyndolyn Saxon     GERD (gastroesophageal reflux disease)    Hyperlipidemia    Hypertension    SKIN CANCER, HX OF 09/01/2009   Qualifier: Diagnosis of  By: Linna Darner MD, Fidela Juneau Cell  X 3-4; [place]  [facility]     Ulcer 1989 & 2000   bleedng X2   Past Surgical History:  Past Surgical History:  Procedure Laterality Date   colonoscopy with polypectomy     Dr Watt Climes   EYE SURGERY     post trauma with glss fragments   INGUINAL HERNIA REPAIR  1970   pilonidal cystectomy     TOTAL HIP ARTHROPLASTY Right 12/06/2019   Procedure: RIGHT TOTAL HIP ARTHROPLASTY ANTERIOR APPROACH;  Surgeon: Mcarthur Rossetti, MD;  Location: [facility];  Service: Orthopedics;  Laterality:  Right;    Social History:  reports that he has quit smoking. He has quit using smokeless tobacco.  His smokeless tobacco use included chew. He reports that he does not drink alcohol and does not use drugs. Family History: family history includes Diabetes in his brother; Hyperlipidemia in his brother; Hypertension in his brother; Stroke in his mother.   HOME MEDICATIONS: Allergies as of 05/24/2021       Reactions   Ibuprofen    Hx of bleeding ulcer   Choline Fenofibrate Other (See Comments)   ABDOMINAL PAIN, CONSTIPATION        Medication List        Accurate as of May 24, 2021 11:25 AM. If you have any questions, ask your nurse or doctor.          amLODipine 10 MG tablet Commonly known as: NORVASC Take 1 tablet (10 mg total) by mouth daily.   atorvastatin 40 MG tablet Commonly known as: LIPITOR Take 1 tablet (40 mg total) by mouth daily.   QUERCETIN PO Take 500 mg by mouth daily.   tadalafil 20 MG tablet Commonly known as: CIALIS TAKE 1 TABLET BY MOUTH AS NEEDED FOR ERECTILE DYSFUNCTION   VITAMIN C PO Take 1,650 mg by mouth daily.   Vitamin D3 125 MCG (  5000 UT) Tabs Take 5,000 Units by mouth daily.   vitamin k 100 MCG tablet Take 150 mcg by mouth daily.   Zinc 50 MG Caps Take 30 mg by mouth daily.          REVIEW OF SYSTEMS: A comprehensive ROS was conducted with the patient and is negative except as per HPI     OBJECTIVE:  VS: There were no vitals taken for this visit.   Wt Readings from Last 3 Encounters:  04/13/21 204 lb (92.5 kg)  01/22/21 204 lb (92.5 kg)  10/29/20 196 lb 6.4 oz (89.1 kg)     EXAM: General: Pt appears well and is in NAD  Neck: General: Supple without adenopathy. Thyroid: Thyroid size normal.  No goiter or nodules appreciated.   Lungs: Clear with good BS bilat with no rales, rhonchi, or wheezes  Heart: Auscultation: RRR.  Abdomen: Normoactive bowel sounds, soft, nontender, without masses or organomegaly palpable   Extremities:  BL LE: No pretibial edema normal ROM and strength.  Mental Status: Judgment, insight: Intact Orientation: Oriented to time, place, and person Mood and affect: No depression, anxiety, or agitation     DATA REVIEWED:   ***    DXA 01/25/2021   Results:   Lumbar spine L1-L4 Femoral neck (FN) 33% distal radius  T-score -0.4 RFN:  LFN: -1.6 -0.3     Assessment: the BMD is low according to the WHO classification for osteoporosis      KUB 01/22/2021  Nonspecific bowel gas pattern. Kidneys are partly obscured by bowel contents. As far as seen, no definite renal or ureteral stones are seen.   ASSESSMENT/PLAN/RECOMMENDATIONS:   PTH Mediated Hypercalcemia :  Patient needs further evaluation to determine surgical candidacy - Encouraged hydration  - AVOID CALCIUM SUPPLEMENTS, AVOID LOW CALCIUM DIET - Maintain normal dietary calcium intake (2-3 servings of dairy a day) - DXA showed low bone density  - KUB  to r/o nephrolithiasis or nephrocalcinosis.  - Decrease Vitamin D to 3000 iu daily   F/U in 4 months   Signed electronically by: Mack Guise, MD  [facility]  [facility] [address] [place], [place] [address] Phone: [phone] FAX: [phone]   CC: Vivi Barrack, [place] [place] [address] Phone: [phone] Fax: [phone]   Return to Endocrinology clinic as below: Future Appointments  Date Time Provider [place]  05/24/2021  2:00 PM Keysi Oelkers, Melanie Crazier, MD [facility] None  11/04/2021  2:00 PM Vivi Barrack, MD [facility]  04/14/2022 11:45 AM [facility] HEALTH COACH [facility]

## 2021-05-25 LAB — PARATHYROID HORMONE, INTACT (NO CA): PTH: 93 pg/mL — ABNORMAL HIGH (ref 16–77)

## 2021-06-04 ENCOUNTER — Other Ambulatory Visit: Payer: Self-pay

## 2021-06-04 ENCOUNTER — Other Ambulatory Visit: Payer: Medicare Other

## 2021-06-04 DIAGNOSIS — E210 Primary hyperparathyroidism: Secondary | ICD-10-CM

## 2021-06-04 NOTE — Progress Notes (Unsigned)
Total volume 1,650.  Started 06-03-21 at 6:00 am, ended 06-04-2021 6:00 am.   ?

## 2021-06-05 LAB — CALCIUM, URINE, 24 HOUR: Calcium, 24H Urine: 223 mg/24 h

## 2021-06-05 LAB — CREATININE, URINE, 24 HOUR: Creatinine, 24H Ur: 1.2 g/(24.h) (ref 0.50–2.15)

## 2021-06-09 ENCOUNTER — Other Ambulatory Visit: Payer: Self-pay

## 2021-07-15 DIAGNOSIS — M19072 Primary osteoarthritis, left ankle and foot: Secondary | ICD-10-CM | POA: Diagnosis not present

## 2021-07-15 DIAGNOSIS — W3400XA Accidental discharge from unspecified firearms or gun, initial encounter: Secondary | ICD-10-CM | POA: Diagnosis not present

## 2021-07-15 DIAGNOSIS — Z23 Encounter for immunization: Secondary | ICD-10-CM | POA: Diagnosis not present

## 2021-07-15 DIAGNOSIS — S91302A Unspecified open wound, left foot, initial encounter: Secondary | ICD-10-CM | POA: Diagnosis not present

## 2021-07-15 DIAGNOSIS — S92002A Unspecified fracture of left calcaneus, initial encounter for closed fracture: Secondary | ICD-10-CM | POA: Diagnosis not present

## 2021-07-15 DIAGNOSIS — S91002A Unspecified open wound, left ankle, initial encounter: Secondary | ICD-10-CM | POA: Diagnosis not present

## 2021-07-15 DIAGNOSIS — Y999 Unspecified external cause status: Secondary | ICD-10-CM | POA: Diagnosis not present

## 2021-07-15 DIAGNOSIS — R0902 Hypoxemia: Secondary | ICD-10-CM | POA: Diagnosis not present

## 2021-07-15 DIAGNOSIS — S92002B Unspecified fracture of left calcaneus, initial encounter for open fracture: Secondary | ICD-10-CM | POA: Diagnosis not present

## 2021-07-15 DIAGNOSIS — S91332A Puncture wound without foreign body, left foot, initial encounter: Secondary | ICD-10-CM | POA: Diagnosis not present

## 2021-07-19 DIAGNOSIS — S92002D Unspecified fracture of left calcaneus, subsequent encounter for fracture with routine healing: Secondary | ICD-10-CM | POA: Diagnosis not present

## 2021-07-19 DIAGNOSIS — S91332D Puncture wound without foreign body, left foot, subsequent encounter: Secondary | ICD-10-CM | POA: Diagnosis not present

## 2021-08-11 DIAGNOSIS — S91332D Puncture wound without foreign body, left foot, subsequent encounter: Secondary | ICD-10-CM | POA: Diagnosis not present

## 2021-08-11 DIAGNOSIS — S92002D Unspecified fracture of left calcaneus, subsequent encounter for fracture with routine healing: Secondary | ICD-10-CM | POA: Diagnosis not present

## 2021-08-11 DIAGNOSIS — L97522 Non-pressure chronic ulcer of other part of left foot with fat layer exposed: Secondary | ICD-10-CM | POA: Diagnosis not present

## 2021-09-08 DIAGNOSIS — S91332D Puncture wound without foreign body, left foot, subsequent encounter: Secondary | ICD-10-CM | POA: Diagnosis not present

## 2021-09-08 DIAGNOSIS — S92002D Unspecified fracture of left calcaneus, subsequent encounter for fracture with routine healing: Secondary | ICD-10-CM | POA: Diagnosis not present

## 2021-10-26 DIAGNOSIS — H6123 Impacted cerumen, bilateral: Secondary | ICD-10-CM | POA: Diagnosis not present

## 2021-10-26 DIAGNOSIS — H838X3 Other specified diseases of inner ear, bilateral: Secondary | ICD-10-CM | POA: Diagnosis not present

## 2021-10-26 DIAGNOSIS — H903 Sensorineural hearing loss, bilateral: Secondary | ICD-10-CM | POA: Diagnosis not present

## 2021-10-28 DIAGNOSIS — L821 Other seborrheic keratosis: Secondary | ICD-10-CM | POA: Diagnosis not present

## 2021-10-28 DIAGNOSIS — D692 Other nonthrombocytopenic purpura: Secondary | ICD-10-CM | POA: Diagnosis not present

## 2021-10-28 DIAGNOSIS — L720 Epidermal cyst: Secondary | ICD-10-CM | POA: Diagnosis not present

## 2021-10-28 DIAGNOSIS — L814 Other melanin hyperpigmentation: Secondary | ICD-10-CM | POA: Diagnosis not present

## 2021-10-28 DIAGNOSIS — D1801 Hemangioma of skin and subcutaneous tissue: Secondary | ICD-10-CM | POA: Diagnosis not present

## 2021-10-28 DIAGNOSIS — Z85828 Personal history of other malignant neoplasm of skin: Secondary | ICD-10-CM | POA: Diagnosis not present

## 2021-10-28 DIAGNOSIS — C44519 Basal cell carcinoma of skin of other part of trunk: Secondary | ICD-10-CM | POA: Diagnosis not present

## 2021-10-28 DIAGNOSIS — D485 Neoplasm of uncertain behavior of skin: Secondary | ICD-10-CM | POA: Diagnosis not present

## 2021-10-28 DIAGNOSIS — L570 Actinic keratosis: Secondary | ICD-10-CM | POA: Diagnosis not present

## 2021-11-04 ENCOUNTER — Encounter: Payer: Self-pay | Admitting: Family Medicine

## 2021-11-04 ENCOUNTER — Ambulatory Visit (INDEPENDENT_AMBULATORY_CARE_PROVIDER_SITE_OTHER): Payer: Medicare Other | Admitting: Family Medicine

## 2021-11-04 VITALS — BP 137/79 | HR 89 | Temp 98.2°F | Ht 72.0 in | Wt 196.6 lb

## 2021-11-04 DIAGNOSIS — I10 Essential (primary) hypertension: Secondary | ICD-10-CM

## 2021-11-04 DIAGNOSIS — E785 Hyperlipidemia, unspecified: Secondary | ICD-10-CM

## 2021-11-04 DIAGNOSIS — Z1211 Encounter for screening for malignant neoplasm of colon: Secondary | ICD-10-CM

## 2021-11-04 DIAGNOSIS — E210 Primary hyperparathyroidism: Secondary | ICD-10-CM | POA: Diagnosis not present

## 2021-11-04 DIAGNOSIS — R739 Hyperglycemia, unspecified: Secondary | ICD-10-CM | POA: Diagnosis not present

## 2021-11-04 NOTE — Assessment & Plan Note (Signed)
Blood pressure at goal today on amlodipine 10 mg daily.

## 2021-11-04 NOTE — Assessment & Plan Note (Signed)
Continue management per endocrinology. 

## 2021-11-04 NOTE — Assessment & Plan Note (Signed)
Check lipids.  He is on Lipitor 40 mg daily. 

## 2021-11-04 NOTE — Progress Notes (Signed)
   CHASEN MENDELL is a 76 y.o. male who presents today for an office visit.  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  Hyperglycemia Check A1c.  Dyslipidemia Check lipids.  He is on Lipitor 40 mg daily.  Essential hypertension Blood pressure at goal today on amlodipine 10 mg daily.     Subjective:  HPI:  See A/p for status of chronic conditions.         Objective:  Physical Exam: BP 137/79   Pulse 89   Temp 98.2 F (36.8 C) (Temporal)   Ht 6' (1.829 m)   Wt 196 lb 9.6 oz (89.2 kg)   SpO2 94%   BMI 26.66 kg/m   Gen: No acute distress, resting comfortably CV: Regular rate and rhythm with no murmurs appreciated Pulm: Normal work of breathing, clear to auscultation bilaterally with no crackles, wheezes, or rhonchi Neuro: Grossly normal, moves all extremities Psych: Normal affect and thought content      Wilbur Labuda M. Jerline Pain, MD 11/04/2021 2:39 PM

## 2021-11-04 NOTE — Assessment & Plan Note (Signed)
Check A1c. 

## 2021-11-04 NOTE — Patient Instructions (Signed)
It was very nice to see you today!  We will check blood work today.  Please continue to work on diet and exercise.  We will order Cologuard.  I will see back for your next annual checkup.  Please come back to see [place] sooner if needed.  Take care, Dr Jerline Pain  PLEASE NOTE:  If you had any lab tests please let us know if you have not heard back within a few days. You may see your results on mychart before we have a chance to review them but we will give you a call once they are reviewed by [place]. If we ordered any referrals today, please let us know if you have not heard from their office within the next week.   Please try these tips to maintain a healthy lifestyle:  Eat at least 3 REAL meals and 1-2 snacks per day.  Aim for no more than 5 hours between eating.  If you eat breakfast, please do so within one hour of getting up.   Each meal should contain half fruits/vegetables, one quarter protein, and one quarter carbs (no bigger than a computer mouse)  Cut down on sweet beverages. This includes juice, soda, and sweet tea.   Drink at least 1 glass of water with each meal and aim for at least 8 glasses per day  Exercise at least 150 minutes every week.

## 2021-11-05 LAB — HEMOGLOBIN A1C: Hgb A1c MFr Bld: 5.9 % (ref 4.6–6.5)

## 2021-11-05 LAB — CBC
HCT: 45.8 % (ref 39.0–52.0)
Hemoglobin: 15.3 g/dL (ref 13.0–17.0)
MCHC: 33.3 g/dL (ref 30.0–36.0)
MCV: 92.3 fl (ref 78.0–100.0)
Platelets: 229 10*3/uL (ref 150.0–400.0)
RBC: 4.96 Mil/uL (ref 4.22–5.81)
RDW: 13.7 % (ref 11.5–15.5)
WBC: 8.5 10*3/uL (ref 4.0–10.5)

## 2021-11-05 LAB — LIPID PANEL
Cholesterol: 112 mg/dL (ref 0–200)
HDL: 28.6 mg/dL — ABNORMAL LOW (ref >39.00)
LDL Cholesterol: 50 mg/dL (ref 0–99)
NonHDL: 83.6
Total CHOL/HDL Ratio: 4
Triglycerides: 170 mg/dL — ABNORMAL HIGH (ref 0.0–149.0)
VLDL: 34 mg/dL (ref 0.0–40.0)

## 2021-11-05 LAB — COMPREHENSIVE METABOLIC PANEL
ALT: 11 U/L (ref 0–53)
AST: 16 U/L (ref 0–37)
Albumin: 4.2 g/dL (ref 3.5–5.2)
Alkaline Phosphatase: 99 U/L (ref 39–117)
BUN: 17 mg/dL (ref 6–23)
CO2: 29 mEq/L (ref 19–32)
Calcium: 11.7 mg/dL — ABNORMAL HIGH (ref 8.4–10.5)
Chloride: 105 mEq/L (ref 96–112)
Creatinine, Ser: 1.11 mg/dL (ref 0.40–1.50)
GFR: 65.73 mL/min (ref >60.00)
Glucose, Bld: 83 mg/dL (ref 70–99)
Potassium: 4.4 mEq/L (ref 3.5–5.1)
Sodium: 141 mEq/L (ref 135–145)
Total Bilirubin: 0.6 mg/dL (ref 0.2–1.2)
Total Protein: 7.1 g/dL (ref 6.0–8.3)

## 2021-11-05 LAB — TSH: TSH: 1.7 u[IU]/mL (ref 0.35–5.50)

## 2021-11-09 NOTE — Progress Notes (Signed)
Please inform patient of the following:  Labs are all stable.  Do not need to make any changes to treatment plan at this time.  He should continue to work on diet and exercise and we can recheck in year.

## 2021-11-22 DIAGNOSIS — Z1211 Encounter for screening for malignant neoplasm of colon: Secondary | ICD-10-CM | POA: Diagnosis not present

## 2021-11-24 ENCOUNTER — Ambulatory Visit (INDEPENDENT_AMBULATORY_CARE_PROVIDER_SITE_OTHER): Payer: Medicare Other | Admitting: Internal Medicine

## 2021-11-24 ENCOUNTER — Encounter: Payer: Self-pay | Admitting: Internal Medicine

## 2021-11-24 VITALS — BP 132/78 | HR 71 | Ht 72.0 in | Wt 199.0 lb

## 2021-11-24 DIAGNOSIS — E210 Primary hyperparathyroidism: Secondary | ICD-10-CM

## 2021-11-24 NOTE — Progress Notes (Signed)
Name: [name]  MRN/ DOB: [id number], 1945-08-12    Age/ Sex: 76 y.o., male    PCP: Vivi Barrack, MD   Reason for Endocrinology Evaluation: Hypercalcemia      Date of Initial Endocrinology Evaluation: 01/22/2021    HPI: [name] is a 76 y.o. male with a past medical history of HTN and Dyslipidemia. The patient presented for initial endocrinology clinic visit on 01/22/2021 for consultative assistance with his Hypercalcemia .   [name] indicates that he was first diagnosed with hypercalcemia in 2014, with a serum max of 11.8 mg/dL ( noncorrected) and inappropriately normal PTH     He denies  history of kidney stones, kidney disease, liver disease, granulomatous disease.   DXA: Low bone density 01/2021 KUB: No renal stones 01/2021  24-hr urinary calcium excretion normal at 223 mg (05/2021)   SUBJECTIVE:    Today (11/24/21):  [name] is here for a follow up on hyperparathyroidism/Hypercalcemia. He is accompanied by his wife today.   Denies polydipsia nor polyuria Denies constipation  Energy level stable  Denies recent falls or bone fractures    He has been avoiding OTC calcium tablets  Vitamin D was discontinued  He has been consuming      HISTORY:  Past Medical History:  Past Medical History:  Diagnosis Date  . Arthritis   . COLONIC POLYPS, HX OF 09/13/2007   Qualifier: Diagnosis of  By: Linna Darner MD, Gwyndolyn Saxon   Dr Clarene Essex, GI Colonoscopy @ 5 year intervals    . DUODENAL ULCER, HX OF 09/13/2007   Qualifier: Diagnosis of  By: Linna Darner MD, Gwyndolyn Saxon    . GERD (gastroesophageal reflux disease)   . Hyperlipidemia   . Hypertension   . SKIN CANCER, HX OF 09/01/2009   Qualifier: Diagnosis of  By: Linna Darner MD, Fidela Juneau Cell  X 3-4; [facility]    . Ulcer 1989 & 2000   bleedng X2   Past Surgical History:  Past Surgical History:  Procedure Laterality Date  . colonoscopy with polypectomy     Dr Watt Climes  . EYE SURGERY     post  trauma with glss fragments  . INGUINAL HERNIA REPAIR  1970  . pilonidal cystectomy    . 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 He has quit using smokeless tobacco.  His smokeless tobacco use included chew. He reports that he does not drink alcohol and does not use drugs. Family History: family history includes Diabetes in his brother; Hyperlipidemia in his brother; Hypertension in his brother; Stroke in his mother.   HOME MEDICATIONS: Allergies as of 11/24/2021       Reactions   Ibuprofen    Hx of bleeding ulcer   Choline Fenofibrate Other (See Comments)   ABDOMINAL PAIN, CONSTIPATION        Medication List        Accurate as of November 24, 2021  2:00 PM. If you have any questions, ask your nurse or doctor.          amLODipine 10 MG tablet Commonly known as: NORVASC Take 1 tablet (10 mg total) by mouth daily.   atorvastatin 40 MG tablet Commonly known as: LIPITOR Take 1 tablet (40 mg total) by mouth daily.  QUERCETIN PO Take 500 mg by mouth daily.   tadalafil 20 MG tablet Commonly known as: CIALIS TAKE 1 TABLET BY MOUTH AS NEEDED FOR ERECTILE DYSFUNCTION   VITAMIN C PO Take 1,650 mg by mouth daily.   Zinc 50 MG Caps Take 30 mg by mouth daily.          REVIEW OF SYSTEMS: A comprehensive ROS was conducted with the patient and is negative except as per HPI     OBJECTIVE:  VS: BP 132/78 (BP Location: Left Arm, Patient Position: Sitting, Cuff Size: Small)   Pulse 71   Ht 6' (1.829 m)   Wt 199 lb (90.3 kg)   SpO2 92%   BMI 26.99 kg/m    Wt Readings from Last 3 Encounters:  11/24/21 199 lb (90.3 kg)  11/04/21 196 lb 9.6 oz (89.2 kg)  05/24/21 196 lb 12.8 oz (89.3 kg)     EXAM: General: Pt appears well and is in NAD  Neck:  General: Supple without adenopathy. Thyroid: Thyroid size normal.  No goiter or nodules appreciated.   Lungs: Clear with good BS bilat with no rales, rhonchi, or wheezes  Heart: Auscultation: RRR.  Abdomen: Normoactive bowel sounds, soft, nontender, without masses or organomegaly palpable  Extremities:  BL LE: No pretibial edema normal ROM and strength.  Mental Status: Judgment, insight: Intact Orientation: Oriented to time, place, and person Mood and affect: No depression, anxiety, or agitation     DATA REVIEWED:     Latest Reference Range & Units 11/04/21 14:52  Sodium 135 - 145 mEq/L 141  Potassium 3.5 - 5.1 mEq/L 4.4  Chloride 96 - 112 mEq/L 105  CO2 19 - 32 mEq/L 29  Glucose 70 - 99 mg/dL 83  BUN 6 - 23 mg/dL 17  Creatinine 0.40 - 1.50 mg/dL 1.11  Calcium 8.4 - 10.5 mg/dL 11.7 (H)  Alkaline Phosphatase 39 - 117 U/L 99  Albumin 3.5 - 5.2 g/dL 4.2  AST 0 - 37 U/L 16  ALT 0 - 53 U/L 11  Total Protein 6.0 - 8.3 g/dL 7.1  Total Bilirubin 0.2 - 1.2 mg/dL 0.6  GFR >60.00 mL/min 65.73     Latest Reference Range & Units 05/24/21 14:29  VITD 30.00 - 100.00 ng/mL 62.61    Latest Reference Range & Units 05/24/21 14:29  PTH, Intact 16 - 77 pg/mL 93 (H)     DXA 01/25/2021   Results:   Lumbar spine L1-L4 Femoral neck (FN) 33% distal radius  T-score -0.4 RFN:  LFN: -1.6 -0.3     Assessment: the BMD is low according to the WHO classification for osteoporosis      KUB 01/22/2021  Nonspecific bowel gas pattern. Kidneys are partly obscured by bowel contents. As far as seen, no definite renal or ureteral stones are seen.   ASSESSMENT/PLAN/RECOMMENDATIONS:   PTH Mediated Hypercalcemia :   - Pt met surgical criteria for parathyroidectomy due to elevated serum calcium > 11.5 mg/dL -A referral to general surgery has been done today -I will proceed with thyroid ultrasound -24-hour urinary collection has been normal at 225 mg (05/2021) - DXA showed low bone density   - KUB no evidence of renal stones - Vitamin D remains at the upper limit of normal, we stopped it in the past    Recommendations  - Encouraged hydration  - AVOID CALCIUM SUPPLEMENTS, AVOID LOW CALCIUM DIET - Maintain normal dietary calcium intake (2-3 servings of dairy a day)   F/U in 6 months  Signed electronically by: Mack Guise, MD  [facility]  [facility] [address] [place], [place] [address] Phone: [phone] FAX: [phone]   CC: Vivi Barrack, [address] [place] [address] Phone: [phone] Fax: [phone]   Return to Endocrinology clinic as below: Future Appointments  Date Time Provider [place]  04/14/2022 11:45 AM [facility] [place]  11/07/2022  2:00 PM Vivi Barrack, MD [facility]

## 2021-11-24 NOTE — Patient Instructions (Signed)
-   Encouraged hydration  - AVOID CALCIUM SUPPLEMENTS, AVOID LOW CALCIUM DIET - Maintain normal dietary calcium intake (2-3 servings of low fat dairy a day)

## 2021-11-27 LAB — COLOGUARD: COLOGUARD: POSITIVE — AB

## 2021-11-29 NOTE — Progress Notes (Signed)
Please inform patient of the following:  Cologuard is positive. He needs to be referred to GI for colonoscopy.  Algis Greenhouse. Jerline Pain, MD 11/29/2021 7:30 AM

## 2021-12-02 ENCOUNTER — Ambulatory Visit
Admission: RE | Admit: 2021-12-02 | Discharge: 2021-12-02 | Disposition: A | Discharge status: Home / Self Care | Payer: Medicare Other | Source: Ambulatory Visit | Attending: Internal Medicine | Admitting: Internal Medicine

## 2021-12-02 DIAGNOSIS — E041 Nontoxic single thyroid nodule: Secondary | ICD-10-CM | POA: Diagnosis not present

## 2021-12-02 DIAGNOSIS — E210 Primary hyperparathyroidism: Secondary | ICD-10-CM

## 2021-12-06 ENCOUNTER — Other Ambulatory Visit: Payer: Self-pay

## 2021-12-06 DIAGNOSIS — R195 Other fecal abnormalities: Secondary | ICD-10-CM

## 2021-12-08 ENCOUNTER — Other Ambulatory Visit: Payer: Self-pay | Admitting: Family Medicine

## 2021-12-28 ENCOUNTER — Telehealth: Payer: Self-pay

## 2021-12-28 ENCOUNTER — Ambulatory Visit (INDEPENDENT_AMBULATORY_CARE_PROVIDER_SITE_OTHER): Payer: Medicare Other

## 2021-12-28 DIAGNOSIS — Z23 Encounter for immunization: Secondary | ICD-10-CM | POA: Diagnosis not present

## 2021-12-28 NOTE — Telephone Encounter (Signed)
Patient is wondering if he should get RSV vaccine

## 2021-12-29 NOTE — Telephone Encounter (Signed)
It is probably a good idea for him to get it.  He can schedule an appointment to discuss more in depth if he wishes.  Algis Greenhouse. Jerline Pain, MD 12/29/2021 7:25 AM

## 2021-12-29 NOTE — Telephone Encounter (Signed)
LMOVM with PCP recommendations

## 2021-12-29 NOTE — Telephone Encounter (Signed)
Patient requests to be called at ph# [phone]  to discuss questions about RSV vaccine

## 2021-12-30 DIAGNOSIS — E210 Primary hyperparathyroidism: Secondary | ICD-10-CM | POA: Diagnosis not present

## 2021-12-30 NOTE — Telephone Encounter (Signed)
Need OV see PCP note

## 2021-12-30 NOTE — Telephone Encounter (Signed)
Pt declined OV. Just asking which RSV vaccine to take.Please advise

## 2022-01-01 ENCOUNTER — Other Ambulatory Visit: Payer: Self-pay | Admitting: Family Medicine

## 2022-01-03 ENCOUNTER — Other Ambulatory Visit: Payer: Self-pay | Admitting: Surgery

## 2022-01-03 ENCOUNTER — Other Ambulatory Visit (HOSPITAL_COMMUNITY): Payer: Self-pay | Admitting: Surgery

## 2022-01-03 DIAGNOSIS — E210 Primary hyperparathyroidism: Secondary | ICD-10-CM

## 2022-01-14 ENCOUNTER — Encounter (HOSPITAL_COMMUNITY)
Admission: RE | Admit: 2022-01-14 | Discharge: 2022-01-14 | Disposition: A | Discharge status: Home / Self Care | Payer: Medicare Other | Source: Ambulatory Visit | Attending: Surgery | Admitting: Surgery

## 2022-01-14 DIAGNOSIS — E041 Nontoxic single thyroid nodule: Secondary | ICD-10-CM | POA: Diagnosis not present

## 2022-01-14 DIAGNOSIS — E210 Primary hyperparathyroidism: Secondary | ICD-10-CM | POA: Insufficient documentation

## 2022-01-14 MED ORDER — TECHNETIUM TC 99M SESTAMIBI GENERIC - CARDIOLITE
26.3000 | Freq: Once | INTRAVENOUS | Status: AC
  Administered 2022-01-14: 26.3 via INTRAVENOUS

## 2022-02-01 ENCOUNTER — Ambulatory Visit: Payer: Self-pay | Admitting: Surgery

## 2022-02-01 NOTE — Progress Notes (Signed)
Telephone call to patient with results of sestamibi scan.  Localizes a right inferior adenoma.  Plan minimally invasive outpatient surgery as discussed with patient.  Will enter orders and send to schedulers to contact patient.  Patient states he may want to wait until after the first of the year for surgery, and that is fine.  [place], MD [facility] [place] [facility] Office: [phone]

## 2022-02-17 DIAGNOSIS — D123 Benign neoplasm of transverse colon: Secondary | ICD-10-CM | POA: Diagnosis not present

## 2022-02-17 DIAGNOSIS — K5730 Diverticulosis of large intestine without perforation or abscess without bleeding: Secondary | ICD-10-CM | POA: Diagnosis not present

## 2022-02-17 DIAGNOSIS — Z86010 Personal history of colonic polyps: Secondary | ICD-10-CM | POA: Diagnosis not present

## 2022-02-17 DIAGNOSIS — Z09 Encounter for follow-up examination after completed treatment for conditions other than malignant neoplasm: Secondary | ICD-10-CM | POA: Diagnosis not present

## 2022-02-17 DIAGNOSIS — D175 Benign lipomatous neoplasm of intra-abdominal organs: Secondary | ICD-10-CM | POA: Diagnosis not present

## 2022-02-17 DIAGNOSIS — R195 Other fecal abnormalities: Secondary | ICD-10-CM | POA: Diagnosis not present

## 2022-02-17 DIAGNOSIS — K621 Rectal polyp: Secondary | ICD-10-CM | POA: Diagnosis not present

## 2022-02-17 DIAGNOSIS — Z9889 Other specified postprocedural states: Secondary | ICD-10-CM | POA: Diagnosis not present

## 2022-02-17 DIAGNOSIS — K635 Polyp of colon: Secondary | ICD-10-CM | POA: Diagnosis not present

## 2022-02-17 DIAGNOSIS — K6389 Other specified diseases of intestine: Secondary | ICD-10-CM | POA: Diagnosis not present

## 2022-02-17 DIAGNOSIS — K649 Unspecified hemorrhoids: Secondary | ICD-10-CM | POA: Diagnosis not present

## 2022-02-17 DIAGNOSIS — D122 Benign neoplasm of ascending colon: Secondary | ICD-10-CM | POA: Diagnosis not present

## 2022-02-17 LAB — HM COLONOSCOPY

## 2022-02-21 ENCOUNTER — Encounter: Payer: Self-pay | Admitting: Family Medicine

## 2022-02-21 ENCOUNTER — Ambulatory Visit (INDEPENDENT_AMBULATORY_CARE_PROVIDER_SITE_OTHER): Payer: Medicare Other | Admitting: Family Medicine

## 2022-02-21 VITALS — BP 129/83 | HR 78 | Temp 97.7°F | Ht 72.0 in | Wt 202.0 lb

## 2022-02-21 DIAGNOSIS — I10 Essential (primary) hypertension: Secondary | ICD-10-CM | POA: Diagnosis not present

## 2022-02-21 DIAGNOSIS — I4510 Unspecified right bundle-branch block: Secondary | ICD-10-CM

## 2022-02-21 DIAGNOSIS — I493 Ventricular premature depolarization: Secondary | ICD-10-CM | POA: Diagnosis not present

## 2022-02-21 DIAGNOSIS — E210 Primary hyperparathyroidism: Secondary | ICD-10-CM | POA: Diagnosis not present

## 2022-02-21 HISTORY — DX: Unspecified right bundle-branch block: I45.10

## 2022-02-21 MED ORDER — LOSARTAN POTASSIUM 100 MG PO TABS: 100.0000 mg | ORAL_TABLET | Freq: Every day | ORAL | 3 refills | Status: DC

## 2022-02-21 NOTE — Assessment & Plan Note (Signed)
Continue management per general surgery and endocrinology.

## 2022-02-21 NOTE — Patient Instructions (Signed)
It was very nice to see you today!  We will refer you to see cardiologist.  Your leg swelling is probably from the amlodipine.  Please stop the amlodipine and start losartan.  Follow-up with me in a couple of weeks to let me know how your blood pressures are looking and let me know if the swelling in your legs is not improving.  Please come back here to get your blood work done in a few weeks if they are not doing this with your preop evaluation.  Take care, Dr Jerline Pain  PLEASE NOTE:  If you had any lab tests please let us know if you have not heard back within a few days. You may see your results on mychart before we have a chance to review them but we will give you a call once they are reviewed by [place]. If we ordered any referrals today, please let us know if you have not heard from their office within the next week.   Please try these tips to maintain a healthy lifestyle:  Eat at least 3 REAL meals and 1-2 snacks per day.  Aim for no more than 5 hours between eating.  If you eat breakfast, please do so within one hour of getting up.   Each meal should contain half fruits/vegetables, one quarter protein, and one quarter carbs (no bigger than a computer mouse)  Cut down on sweet beverages. This includes juice, soda, and sweet tea.   Drink at least 1 glass of water with each meal and aim for at least 8 glasses per day  Exercise at least 150 minutes every week.

## 2022-02-21 NOTE — Assessment & Plan Note (Signed)
Blood pressure is at goal today however he is having some lower extremity edema likely due to side effect of amlodipine.  Will switch to losartan 100 mg daily.  Did discuss with patient that he should come back in a few weeks to have labs rechecked.  Does have upcoming surgery for hyperparathyroidism in a few weeks and he thinks he may be getting labs done with them.  Advised him to come here to get labs done if he does not have them done as part of his preop evaluation.  He will check in with me in a few weeks to let me know his blood pressures are looking with the medication change above.

## 2022-02-21 NOTE — Progress Notes (Signed)
   [name] is a 76 y.o. male who presents today for an office visit.  Assessment/Plan:  New/Acute Problems: PVC / RBBB EKG did does not show any PVCs however he does have a right bundle branch block which has been present on his EKG from about 10 years ago.  Not currently having any concerning symptoms.  Reassured patient these are typically benign findings however he is at increased cardiovascular risk due to age and comorbidities.  Will place referral to cardiology for further evaluation and management.  Leg Edema No red flags.  Likely side effect of amlodipine.  Will be switching to losartan as below.  Encouraged salt avoidance leg elevation and compression stockings.  He will let me know if not improving with medications which is below.  Chronic Problems Addressed Today: Essential hypertension Blood pressure is at goal today however he is having some lower extremity edema likely due to side effect of amlodipine.  Will switch to losartan 100 mg daily.  Did discuss with patient that he should come back in a few weeks to have labs rechecked.  Does have upcoming surgery for hyperparathyroidism in a few weeks and he thinks he may be getting labs done with them.  Advised him to come here to get labs done if he does not have them done as part of his preop evaluation.  He will check in with me in a few weeks to let me know his blood pressures are looking with the medication change above.  Primary hyperparathyroidism ([place]) Continue management per general surgery and endocrinology.     Subjective:  HPI:  See A/p for status of chronic conditions.  Main concern today is the irregular heart rhythm.  Recently had a colonoscopy done.  While he was having this done he was informed that he did have an abnormal heart rhythm.  They informed him that he needed to follow-up with [place] and potentially cardiology soon.  He does not have any symptoms.  No chest pain or shortness of breath.  No  palpitations.  No dizziness.  He has also noticed lower extremity swelling for the last several weeks.  No recent medication changes.  No dietary changes.  No orthopnea.  Symptoms seem to be better in the morning and progressively worse throughout the day.       Objective:  Physical Exam: BP 129/83   Pulse 78   Temp 97.7 F (36.5 C) (Temporal)   Ht 6' (1.829 m)   Wt 202 lb (91.6 kg)   SpO2 95%   BMI 27.40 kg/m   Gen: No acute distress, resting comfortably CV: Regular rate and rhythm with no murmurs appreciated.  Occasional ectopic beat noted Pulm: Normal work of breathing, clear to auscultation bilaterally with no crackles, wheezes, or rhonchi MSK: 1+ pitting edema to knees bilaterally Neuro: Grossly normal, moves all extremities Psych: Normal affect and thought content  Todays EKG in the office: NSR.  No PVCs.  Right bundle branch block noted.  EKG during colonoscopy:     Time Spent: 40 minutes of total time was spent on the date of the encounter performing the following actions: chart review prior to seeing the patient, obtaining history, performing a medically necessary exam, reviewing his outside medical records, counseling on the treatment plan, placing orders, and documenting in our EHR. This does not include time spent interpreting the EKG performed in the office today.       Algis Greenhouse. Jerline Pain, MD 02/21/2022 8:30 AM

## 2022-02-22 DIAGNOSIS — D122 Benign neoplasm of ascending colon: Secondary | ICD-10-CM | POA: Diagnosis not present

## 2022-02-22 DIAGNOSIS — D123 Benign neoplasm of transverse colon: Secondary | ICD-10-CM | POA: Diagnosis not present

## 2022-02-22 DIAGNOSIS — K635 Polyp of colon: Secondary | ICD-10-CM | POA: Diagnosis not present

## 2022-02-22 DIAGNOSIS — K621 Rectal polyp: Secondary | ICD-10-CM | POA: Diagnosis not present

## 2022-03-04 ENCOUNTER — Other Ambulatory Visit (INDEPENDENT_AMBULATORY_CARE_PROVIDER_SITE_OTHER): Payer: Medicare Other

## 2022-03-04 ENCOUNTER — Encounter (HOSPITAL_BASED_OUTPATIENT_CLINIC_OR_DEPARTMENT_OTHER): Payer: Self-pay | Admitting: Cardiology

## 2022-03-04 ENCOUNTER — Ambulatory Visit (INDEPENDENT_AMBULATORY_CARE_PROVIDER_SITE_OTHER): Payer: Medicare Other | Admitting: Cardiology

## 2022-03-04 VITALS — BP 130/78 | HR 64 | Ht 72.0 in | Wt 205.8 lb

## 2022-03-04 DIAGNOSIS — I10 Essential (primary) hypertension: Secondary | ICD-10-CM

## 2022-03-04 DIAGNOSIS — E7800 Pure hypercholesterolemia, unspecified: Secondary | ICD-10-CM

## 2022-03-04 DIAGNOSIS — I493 Ventricular premature depolarization: Secondary | ICD-10-CM

## 2022-03-04 DIAGNOSIS — Z01810 Encounter for preprocedural cardiovascular examination: Secondary | ICD-10-CM

## 2022-03-04 DIAGNOSIS — I4510 Unspecified right bundle-branch block: Secondary | ICD-10-CM | POA: Diagnosis not present

## 2022-03-04 NOTE — Patient Instructions (Signed)
Medication Instructions:  Your physician recommends that you continue on your current medications as directed. Please refer to the Current Medication list given to you today.   *If you need a refill on your cardiac medications before your next appointment, please call your pharmacy*  Lab Work: NONE  Testing/Procedures: Your physician has requested that you have an echocardiogram. Echocardiography is a painless test that uses sound waves to create images of your heart. It provides your doctor with information about the size and shape of your heart and how well your heart's chambers and valves are working. This procedure takes approximately one hour. There are no restrictions for this procedure. Please do NOT wear cologne, perfume, aftershave, or lotions (deodorant is allowed). Please arrive 15 minutes prior to your appointment time.  7 DAY ZIO  Follow-Up: At [facility], you and your health needs are our priority.  As part of our continuing mission to provide you with exceptional heart care, we have created designated Provider Care Teams.  These Care Teams include your primary Cardiologist (physician) and Advanced Practice Providers (APPs -  Physician Assistants and Nurse Practitioners) who all work together to provide you with the care you need, when you need it.  We recommend signing up for the patient portal called "MyChart".  Sign up information is provided on this After Visit Summary.  MyChart is used to connect with patients for Virtual Visits (Telemedicine).  Patients are able to view lab/test results, encounter notes, upcoming appointments, etc.  Non-urgent messages can be sent to your provider as well.   To learn more about what you can do with MyChart, go to [email or web address].    Your next appointment:   6 week(s)  The format for your next appointment:   In Person  Provider:   Buford Dresser, MD

## 2022-03-04 NOTE — Progress Notes (Signed)
Cardiology Office Note:    Date:  03/04/2022   ID:  [name], DOB 1946/01/07, MRN [id number]  PCP:  Vivi Barrack, MD   [facility] [facility] Cardiologist:  Buford Dresser, MD     Referring MD: Vivi Barrack, MD   CC: Consult for RBBB and PVCs  History of Present Illness:    [name] is a 76 y.o. male with a hx of HTN, HLD, RBBB and GERD presenting to clinic today for evaluation at the request of his PCP Dr. Dimas Chyle for RBBB and PVCs.  Patient reports that x1 month ago he noticed BLE swelling. He then had a colonoscopy on 02/17/22 and was told by a nurse afterward that he had an irregular heartbeat during the procedure.  Patient then saw his PCP Dr. Jerline Pain who explained to him that he did not have an irregular rhythm only PVCs and RBBB which he had on a prior EKGs. Dr. Jerline Pain discontinued his Norvasc and switched him to Losartan at that time. Pt has had no further leg swelling since discontinuing the Norvasc.  In 2014 pt had an EKG which showed 1 PAC. We discussed his previous EKG findings including RBBB which was noted on prior EKG from 2011. Patient notes that he is entirely asymptomatic with his skipped beats.  Patient denies any PMHx of MI or stroke. He denies any family history of heart disease. He denies any other history of abnormal heart rhythms. He denies any history of congenital heart problems.  He reports that he has been on blood pressure medication since January 2020. He has consistently checked his blood pressures since that time and they have been well controlled at home with pressures around 130s/70s. He has had no issues with the Losartan since switching to this a few weeks ago.  The patient denies chest pain, chest pressure, dyspnea at rest or with exertion, PND, orthopnea, or leg swelling. Denies cough, fever, chills, night sweats, nausea, or vomiting. Denies syncope or presyncope. Denies dizziness or lightheadedness. He  denies any hematuria or blood in his stools. He denies any hemoptysis.   He states that he can climb a flight of stairs without any difficulty.  Patient is planning to have right inferior parathyroidectomy surgery on 03/21/22 with Dr. Harlow Asa.  Past Medical History:  Diagnosis Date   Arthritis    COLONIC POLYPS, HX OF 09/13/2007   Qualifier: Diagnosis of  By: Linna Darner MD, Gwyndolyn Saxon   Dr Clarene Essex, GI Colonoscopy @ 5 year intervals     DUODENAL ULCER, HX OF 09/13/2007   Qualifier: Diagnosis of  By: Linna Darner MD, Gwyndolyn Saxon     GERD (gastroesophageal reflux disease)    Hyperlipidemia    Hypertension    SKIN CANCER, HX OF 09/01/2009   Qualifier: Diagnosis of  By: Linna Darner MD, Fidela Juneau Cell  X 3-4; [place]  [facility]     Ulcer 1989 & 2000   bleedng X2    Past Surgical History:  Procedure Laterality Date   colonoscopy with polypectomy     Dr Watt Climes   EYE SURGERY     post trauma with glss fragments   INGUINAL HERNIA REPAIR  1970   pilonidal cystectomy     TOTAL HIP ARTHROPLASTY Right 12/06/2019   Procedure: RIGHT TOTAL HIP ARTHROPLASTY ANTERIOR APPROACH;  Surgeon: Mcarthur Rossetti, MD;  Location: [facility];  Service: Orthopedics;  Laterality: Right;    Current Medications: Current Meds  Medication Sig   Ascorbic Acid (  VITAMIN C PO) Take 800 mg by mouth daily.   atorvastatin (LIPITOR) 40 MG tablet TAKE 1 TABLET BY MOUTH EVERY DAY   losartan (COZAAR) 100 MG tablet Take 1 tablet (100 mg total) by mouth daily.   tadalafil (CIALIS) 20 MG tablet TAKE 1 TABLET BY MOUTH AS NEEDED FOR ERECTILE DYSFUNCTION     Allergies:   Ibuprofen and Choline fenofibrate   Social History   Socioeconomic History   Marital status: Married    Spouse name: Not on file   Number of children: 2   Years of education: 16   Highest education level: Not on file  Occupational History   Occupation: Retired   Tobacco Use   Smoking status: Former   Smokeless tobacco: Former    Types: Nurse, children's Use:  Never used  Substance and Sexual Activity   Alcohol use: No   Drug use: No   Sexual activity: Not on file  Other Topics Concern   Not on file  Social History Narrative   Not on file   Social Determinants of Health   Financial Resource Strain: Low Risk  (04/08/2021)   Overall Financial Resource Strain (CARDIA)    Difficulty of Paying Living Expenses: Not hard at all  Food Insecurity: No Food Insecurity (04/08/2021)   Hunger Vital Sign    Worried About Running Out of Food in the Last Year: Never true    Tobias in the Last Year: Never true  Transportation Needs: No Transportation Needs (04/08/2021)   PRAPARE - Hydrologist (Medical): No    Lack of Transportation (Non-Medical): No  Physical Activity: Inactive (04/08/2021)   Exercise Vital Sign    Days of Exercise per Week: 0 days    Minutes of Exercise per Session: 0 min  Stress: No Stress Concern Present (04/08/2021)   [place]    Feeling of Stress : Not at all  Social Connections: Unknown (04/08/2021)   Social Connection and Isolation Panel [NHANES]    Frequency of Communication with Friends and Family: Twice a week    Frequency of Social Gatherings with Friends and Family: Not on file    Attends Religious Services: More than 4 times per year    Active Member of [employer] or Organizations: Yes    Attends Archivist Meetings: 1 to 4 times per year    Marital Status: Married     Family History: The patient's family history includes Diabetes in his brother; Hyperlipidemia in his brother; Hypertension in his brother; Stroke in his mother. There is no history of Heart disease or Cancer.  ROS:   Please see the history of present illness.    All other systems reviewed and are negative.  EKGs/Labs/Other Studies Reviewed:    The following studies were reviewed today: No prior cardiac studies   EKG:  EKG is personally  reviewed.  02/21/22: SR at 70 bpm, RBBB, 2 PVCs EKG from 2011 showing RBBB. EKG from 2014 showing 1 PAC.  Recent Labs: 11/04/2021: ALT 11; BUN 17; Creatinine, Ser 1.11; Hemoglobin 15.3; Platelets 229.0; Potassium 4.4; Sodium 141; TSH 1.70  Recent Lipid Panel    Component Value Date/Time   CHOL 112 11/04/2021 1452   TRIG 170.0 (H) 11/04/2021 1452   HDL 28.60 (L) 11/04/2021 1452   CHOLHDL 4 11/04/2021 1452   VLDL 34.0 11/04/2021 1452   LDLCALC 50 11/04/2021 1452  [place] 83 10/24/2019 1402   LDLDIRECT 143.8 04/18/2012 1153   Physical Exam:    VS:  BP 130/78 Comment: home  Pulse 64   Ht 6' (1.829 m)   Wt 205 lb 12.8 oz (93.4 kg)   SpO2 95%   BMI 27.91 kg/m     Wt Readings from Last 3 Encounters:  03/04/22 205 lb 12.8 oz (93.4 kg)  02/21/22 202 lb (91.6 kg)  11/24/21 199 lb (90.3 kg)     GEN:  Well nourished, well developed in no acute distress HEENT: Normal NECK: No JVD; No carotid bruits LYMPHATICS: No lymphadenopathy CARDIAC: RRR with occasional ectopy, no murmurs, rubs, gallops RESPIRATORY:  Clear to auscultation without rales, wheezing or rhonchi  ABDOMEN: Soft, non-tender, non-distended MUSCULOSKELETAL:  No edema; No deformity  SKIN: Warm and dry NEUROLOGIC:  Alert and oriented x 3 PSYCHIATRIC:  Normal affect   ASSESSMENT:    1. PVC (premature ventricular contraction)   2. Preop cardiovascular exam   3. RBBB   4. Essential hypertension   5. Pure hypercholesterolemia    PLAN:    PVCs RBBB -will get monitor, echo to further assess  Preoperative cardiovascular risk According to the Revised Cardiac Risk Index (RCRI), his Perioperative Risk of Major Cardiac Event is (%): 0.4  The patient is not currently having active cardiac symptoms, and they can achieve >4 METs of activity.  According to ACC/AHA Guidelines, no further testing is needed.  Proceed with surgery at acceptable risk.  Our service is available as needed in the peri-operative period.      Hypertension -elevated today initially, then improved on recheck. With upcoming surgery, will not make major changes. Follow up post op to make sure BP remains well controlled -continue losartan  Hypercholesterolemia -continue atorvastatin  Follow up: 6 weeks or sooner if needed  Medication Adjustments/Labs and Tests Ordered: Current medicines are reviewed at length with the patient today.  Concerns regarding medicines are outlined above.  Orders Placed This Encounter  Procedures   LONG TERM MONITOR (3-14 DAYS)   ECHOCARDIOGRAM COMPLETE   No orders of the defined types were placed in this encounter.   Patient Instructions  Medication Instructions:  Your physician recommends that you continue on your current medications as directed. Please refer to the Current Medication list given to you today.   *If you need a refill on your cardiac medications before your next appointment, please call your pharmacy*  Lab Work: NONE  Testing/Procedures: Your physician has requested that you have an echocardiogram. Echocardiography is a painless test that uses sound waves to create images of your heart. It provides your doctor with information about the size and shape of your heart and how well your heart's chambers and valves are working. This procedure takes approximately one hour. There are no restrictions for this procedure. Please do NOT wear cologne, perfume, aftershave, or lotions (deodorant is allowed). Please arrive 15 minutes prior to your appointment time.  7 DAY ZIO  Follow-Up: At [facility], you and your health needs are our priority.  As part of our continuing mission to provide you with exceptional heart care, we have created designated Provider Care Teams.  These Care Teams include your primary Cardiologist (physician) and Advanced Practice Providers (APPs -  Physician Assistants and Nurse Practitioners) who all work together to provide you with the care you need,  when you need it.  We recommend signing up for the patient portal called "MyChart".  Sign up information is provided on  this After Visit Summary.  MyChart is used to connect with patients for Virtual Visits (Telemedicine).  Patients are able to view lab/test results, encounter notes, upcoming appointments, etc.  Non-urgent messages can be sent to your provider as well.   To learn more about what you can do with MyChart, go to [email or web address].    Your next appointment:   6 week(s)  The format for your next appointment:   In Person  Provider:   Buford Dresser, MD        I,Alexis Herring,acting as a scribe for Buford Dresser, MD.,have documented all relevant documentation on the behalf of Buford Dresser, MD,as directed by  Buford Dresser, MD while in the presence of Buford Dresser, MD.  I, Buford Dresser, MD, have reviewed all documentation for this visit. The documentation on 03/15/22 for the exam, diagnosis, procedures, and orders are all accurate and complete.   Signed, Buford Dresser, MD  03/04/2022     [place]

## 2022-03-09 NOTE — Progress Notes (Signed)
COVID Vaccine Completed: yes  Date of COVID positive in last 90 days:  PCP - Dimas Chyle, MD Cardiologist - Buford Dresser, MD  Chest x-ray -  EKG - 02/21/22 Epic Stress Test -  ECHO - scheduled 03/29/22 Cardiac Cath -  Pacemaker/ICD device last checked: Spinal Cord Stimulator:  Bowel Prep -   Sleep Study -  CPAP -   Fasting Blood Sugar -  Checks Blood Sugar _____ times a day  Last dose of GLP1 agonist-  N/A GLP1 instructions:  N/A   Last dose of SGLT-2 inhibitors-  N/A SGLT-2 instructions: N/A   Blood Thinner Instructions: Aspirin Instructions: Last Dose:  Activity level:  Can go up a flight of stairs and perform activities of daily living without stopping and without symptoms of chest pain or shortness of breath.  Able to exercise without symptoms  Unable to go up a flight of stairs without symptoms of     Anesthesia review: PVCs, HTN, card rec ECHO  Patient denies shortness of breath, fever, cough and chest pain at PAT appointment  Patient verbalized understanding of instructions that were given to them at the PAT appointment. Patient was also instructed that they will need to review over the PAT instructions again at home before surgery.

## 2022-03-09 NOTE — Patient Instructions (Signed)
SURGICAL WAITING ROOM VISITATION  Patients having surgery or a procedure may have no more than 2 support people in the waiting area - these visitors may rotate.    Children under the age of 58 must have an adult with them who is not the patient.  Due to an increase in RSV and influenza rates and associated hospitalizations, children ages 87 and under may not visit patients in [place].  If the patient needs to stay at the hospital during part of their recovery, the visitor guidelines for inpatient rooms apply. Pre-op nurse will coordinate an appropriate time for 1 support person to accompany patient in pre-op.  This support person may not rotate.    Please refer to the [facility] website for the visitor guidelines for Inpatients (after your surgery is over and you are in a regular room).    Your procedure is scheduled on: 03/21/22   Report to [facility] Main Entrance    Report to admitting at 7:45 AM   Call this number if you have problems the morning of surgery [phone]   Do not eat food :After Midnight.   After Midnight you may have the following liquids until 7:00 AM DAY OF SURGERY  Water Non-Citrus Juices (without pulp, NO RED-Apple, White grape, White cranberry) Black Coffee (NO MILK/CREAM OR CREAMERS, sugar ok)  Clear Tea (NO MILK/CREAM OR CREAMERS, sugar ok) regular and decaf                             Plain Jell-O (NO RED)                                           Fruit ices (not with fruit pulp, NO RED)                                     Popsicles (NO RED)                                                               Sports drinks like Gatorade (NO RED)                      If you have questions, please contact your surgeon's office.   FOLLOW BOWEL PREP AND ANY ADDITIONAL PRE OP INSTRUCTIONS YOU RECEIVED FROM YOUR SURGEON'S OFFICE!!!     Oral Hygiene is also important to reduce your risk of infection.                                     Remember - BRUSH YOUR TEETH THE MORNING OF SURGERY WITH YOUR REGULAR TOOTHPASTE  DENTURES WILL BE REMOVED PRIOR TO SURGERY PLEASE DO NOT APPLY "Poly grip" OR ADHESIVES!!!   Take these medicines the morning of surgery with A SIP OF WATER: Atorvastatin  You may not have any metal on your body including jewelry, and body piercing             Do not wear lotions, powders, cologne, or deodorant              Men may shave face and neck.   Do not bring valuables to the hospital. [place].   Contacts, glasses, dentures or bridgework may not be worn into surgery.  DO NOT [place]. PHARMACY WILL DISPENSE MEDICATIONS LISTED ON YOUR MEDICATION LIST TO YOU DURING YOUR ADMISSION [place]!    Patients discharged on the day of surgery will not be allowed to drive home.  Someone NEEDS to stay with you for the first 24 hours after anesthesia.   Special Instructions: Bring a copy of your healthcare power of attorney and living will documents the day of surgery if you haven't scanned them before.              Please read over the following fact sheets you were given: IF [place] [phone] Schneiders    If you received a COVID test during your pre-op visit  it is requested that you wear a mask when out in public, stay away from anyone that may not be feeling well and notify your surgeon if you develop symptoms. If you test positive for Covid or have been in contact with anyone that has tested positive in the last 10 days please notify you surgeon.    [place] - Preparing for Surgery Before surgery, you can play an important role.  Because skin is not sterile, your skin needs to be as free of germs as possible.  You can reduce the number of germs on your skin by washing with CHG (chlorahexidine gluconate) soap before surgery.  CHG  is an antiseptic cleaner which kills germs and bonds with the skin to continue killing germs even after washing. Please DO NOT use if you have an allergy to CHG or antibacterial soaps.  If your skin becomes reddened/irritated stop using the CHG and inform your nurse when you arrive at Short Stay. Do not shave (including legs and underarms) for at least 48 hours prior to the first CHG shower.  You may shave your face/neck.  Please follow these instructions carefully:  1.  Shower with CHG Soap the night before surgery and the  morning of surgery.  2.  If you choose to wash your hair, wash your hair first as usual with your normal  shampoo.  3.  After you shampoo, rinse your hair and body thoroughly to remove the shampoo.                             4.  Use CHG as you would any other liquid soap.  You can apply chg directly to the skin and wash.  Gently with a scrungie or clean washcloth.  5.  Apply the CHG Soap to your body ONLY FROM THE NECK DOWN.   Do   not use on face/ open                           Wound or open sores. Avoid contact with eyes, ears mouth and  genitals (private parts).                       Wash face,  Genitals (private parts) with your normal soap.             6.  Wash thoroughly, paying special attention to the area where your    surgery  will be performed.  7.  Thoroughly rinse your body with warm water from the neck down.  8.  DO NOT shower/wash with your normal soap after using and rinsing off the CHG Soap.                9.  Pat yourself dry with a clean towel.            10.  Wear clean pajamas.            11.  Place clean sheets on your bed the night of your first shower and do not  sleep with pets. Day of Surgery : Do not apply any lotions/deodorants the morning of surgery.  Please wear clean clothes to the hospital/surgery center.  FAILURE TO FOLLOW THESE INSTRUCTIONS MAY RESULT IN THE CANCELLATION OF YOUR SURGERY  PATIENT  SIGNATURE_________________________________  NURSE SIGNATURE__________________________________  ________________________________________________________________________

## 2022-03-10 ENCOUNTER — Encounter (HOSPITAL_COMMUNITY): Payer: Self-pay

## 2022-03-10 ENCOUNTER — Encounter (HOSPITAL_COMMUNITY)
Admission: RE | Admit: 2022-03-10 | Discharge: 2022-03-10 | Disposition: A | Discharge status: Home / Self Care | Payer: Medicare Other | Source: Ambulatory Visit | Attending: Surgery | Admitting: Surgery

## 2022-03-10 VITALS — BP 146/95 | HR 83 | Temp 97.5°F | Resp 16 | Ht 72.0 in | Wt 196.0 lb

## 2022-03-10 DIAGNOSIS — E210 Primary hyperparathyroidism: Secondary | ICD-10-CM | POA: Insufficient documentation

## 2022-03-10 DIAGNOSIS — I10 Essential (primary) hypertension: Secondary | ICD-10-CM | POA: Diagnosis not present

## 2022-03-10 DIAGNOSIS — Z01818 Encounter for other preprocedural examination: Secondary | ICD-10-CM | POA: Insufficient documentation

## 2022-03-10 HISTORY — DX: Malignant (primary) neoplasm, unspecified: C80.1

## 2022-03-10 LAB — CBC
HCT: 47.4 % (ref 39.0–52.0)
Hemoglobin: 15.6 g/dL (ref 13.0–17.0)
MCH: 30.6 pg (ref 26.0–34.0)
MCHC: 32.9 g/dL (ref 30.0–36.0)
MCV: 93.1 fL (ref 80.0–100.0)
Platelets: 232 10*3/uL (ref 150–400)
RBC: 5.09 MIL/uL (ref 4.22–5.81)
RDW: 13.6 % (ref 11.5–15.5)
WBC: 8.2 10*3/uL (ref 4.0–10.5)
nRBC: 0 % (ref 0.0–0.2)

## 2022-03-10 LAB — BASIC METABOLIC PANEL
Anion gap: 6 (ref 5–15)
BUN: 12 mg/dL (ref 8–23)
CO2: 26 mmol/L (ref 22–32)
Calcium: 10.6 mg/dL — ABNORMAL HIGH (ref 8.9–10.3)
Chloride: 109 mmol/L (ref 98–111)
Creatinine, Ser: 1.13 mg/dL (ref 0.61–1.24)
GFR, Estimated: >60 mL/min (ref >60)
Glucose, Bld: 97 mg/dL (ref 70–99)
Potassium: 3.8 mmol/L (ref 3.5–5.1)
Sodium: 141 mmol/L (ref 135–145)

## 2022-03-15 ENCOUNTER — Encounter (HOSPITAL_BASED_OUTPATIENT_CLINIC_OR_DEPARTMENT_OTHER): Payer: Self-pay | Admitting: Cardiology

## 2022-03-15 NOTE — Progress Notes (Signed)
Anesthesia Chart Review   Case: [id number] Date/Time: 03/21/22 0945   Procedure: RIGHT INFERIOR PARATHYROIDECTOMY (Right)   Anesthesia type: General   Pre-op diagnosis: PRIMARY HYPERPARATHYROIDISM   [facility]   Surgeons: Armandina Gemma, MD       DISCUSSION:77 y.o. former smoker with h/o HTN, primary hyperparathyroidism scheduled for above procedure 03/21/2022 with Dr. Armandina Gemma.   Pt seen by cardiology 03/04/2022 for evaluation of RBBB, PVCs/preoperative evaluation.  Per OV note, "According to the Revised Cardiac Risk Index (RCRI), his Perioperative Risk of Major Cardiac Event is (%): 0.4   The patient is not currently having active cardiac symptoms, and they can achieve >4 METs of activity.   According to ACC/AHA Guidelines, no further testing is needed.  Proceed with surgery at acceptable risk.  Our service is available as needed in the peri-operative period."  Anticipate pt can proceed with planned procedure barring acute status change.   VS: BP (!) 146/95   Pulse 83   Temp (!) 36.4 C (Oral)   Resp 16   Ht 6' (1.829 m)   Wt 88.9 kg   SpO2 93%   BMI 26.58 kg/m   PROVIDERS: Vivi Barrack, MD is PCP    LABS: Labs reviewed: Acceptable for surgery. (all labs ordered are listed, but only abnormal results are displayed)  Labs Reviewed  BASIC METABOLIC PANEL - Abnormal; Notable for the following components:      Result Value   Calcium 10.6 (*)    All other components within normal limits  CBC     IMAGES:   EKG:   CV:  Past Medical History:  Diagnosis Date   Arthritis    Cancer ([place])    skin   COLONIC POLYPS, HX OF 09/13/2007   Qualifier: Diagnosis of  By: Linna Darner MD, Gwyndolyn Saxon   Dr Clarene Essex, GI Colonoscopy @ 5 year intervals     DUODENAL ULCER, HX OF 09/13/2007   Qualifier: Diagnosis of  By: Linna Darner MD, Gwyndolyn Saxon     GERD (gastroesophageal reflux disease)    Hyperlipidemia    Hypertension    SKIN CANCER, HX OF 09/01/2009   Qualifier:  Diagnosis of  By: Linna Darner MD, Fidela Juneau Cell  X 3-4; [place]  [facility]     Ulcer 1989 & 2000   bleedng X2    Past Surgical History:  Procedure Laterality Date   colonoscopy with polypectomy     Dr Watt Climes   EYE SURGERY     post trauma with glss fragments   INGUINAL HERNIA REPAIR  1970   pilonidal cystectomy     TOTAL HIP ARTHROPLASTY Right 12/06/2019   Procedure: RIGHT TOTAL HIP ARTHROPLASTY ANTERIOR APPROACH;  Surgeon: Mcarthur Rossetti, MD;  Location[facility];  Service: Orthopedics;  Laterality: Right;    MEDICATIONS:  Ascorbic Acid (VITAMIN C PO)   atorvastatin (LIPITOR) 40 MG tablet   losartan (COZAAR) 100 MG tablet   tadalafil (CIALIS) 20 MG tablet   No current facility-administered medications for this encounter.    Konrad Felix Ward, PA-C [facility] Pre-Surgical Testing [phone]

## 2022-03-16 DIAGNOSIS — I4510 Unspecified right bundle-branch block: Secondary | ICD-10-CM | POA: Diagnosis not present

## 2022-03-16 DIAGNOSIS — I493 Ventricular premature depolarization: Secondary | ICD-10-CM | POA: Diagnosis not present

## 2022-03-18 ENCOUNTER — Encounter (HOSPITAL_COMMUNITY): Payer: Self-pay | Admitting: Surgery

## 2022-03-18 NOTE — H&P (Signed)
REFERRING PHYSICIAN: Shamleffer, Ibtehal  PROVIDER: Rana Adorno Charlotta Newton, MD   Chief Complaint: New Consultation ( hyperparathyroidism)  History of Present Illness:  Patient is referred by Dr. Vivia Ewing for surgical evaluation and management of suspected primary hyperparathyroidism. Patient had been noted on routine laboratory testing to have elevated serum calcium levels. His most recent level was elevated at 11.7. Patient had undergone intact PTH testing with levels ranging from 93-103. 25 hydroxy vitamin D level was normal at 62.6. 24-hour urine collection for calcium was normal at 223. Patient denies any significant symptoms. He denies fatigue. He has no history of nephrolithiasis. Patient did have a bone density scan which shows osteopenia. Patient denies any significant bone and joint pain but has had a right hip replacement by Dr. Zollie Beckers. Patient denies any previous head or neck surgery. There is no family history of parathyroid disease. Patient presents today accompanied by his wife. Patient had undergone ultrasound examination on December 02, 2021. This showed a normal-sized thyroid gland without significant nodules. An abnormal parathyroid gland was not identified. Patient has not had further imaging studies.  Review of Systems: A complete review of systems was obtained from the patient. I have reviewed this information and discussed as appropriate with the patient. See HPI as well for other ROS.  Review of Systems  Constitutional: Negative.  HENT: Negative.  Eyes: Negative.  Respiratory: Negative.  Cardiovascular: Negative.  Gastrointestinal: Negative.  Genitourinary: Negative.  Musculoskeletal: Negative.  Skin: Negative.  Neurological: Positive for tremors.  Endo/Heme/Allergies: Negative.  Psychiatric/Behavioral: Negative.    Medical History: Past Medical History:  Diagnosis Date  Arthritis  Hypertension   Patient Active Problem List  Diagnosis   Primary hyperparathyroidism (CMS-HCC)   Past Surgical History:  Procedure Laterality Date  HERNIA REPAIR  JOINT REPLACEMENT  LENS EYE SURGERY  PILONIDAL CYST / SINUS EXCISION    Allergies  Allergen Reactions  Ibuprofen Unknown  Hx of bleeding ulcer  Choline Fenofibrate Other (See Comments)  ABDOMINAL PAIN, CONSTIPATION   Current Outpatient Medications on File Prior to Visit  Medication Sig Dispense Refill  amLODIPine (NORVASC) 10 MG tablet Take 1 tablet by mouth once daily  atorvastatin (LIPITOR) 40 MG tablet Take 1 tablet by mouth once daily  tadalafiL (CIALIS) 20 MG tablet Take by mouth at bedtime as needed   No current facility-administered medications on file prior to visit.   Family History  Problem Relation Age of Onset  High blood pressure (Hypertension) Brother  Skin cancer Brother  Diabetes Brother    Social History   Tobacco Use  Smoking Status Former  Types: Cigarettes  Smokeless Tobacco Former    Social History   Socioeconomic History  Marital status: Married  Tobacco Use  Smoking status: Former  Types: Cigarettes  Smokeless tobacco: Former  Substance and Sexual Activity  Alcohol use: Not Currently  Drug use: Never   Objective:   Vitals:  BP: 130/70  Pulse: 83  Temp: 36.1 C (97 F)  SpO2: 97%  Weight: 91.2 kg (201 lb)  Height: 182.9 cm (6')   Body mass index is 27.26 kg/m.  Physical Exam   GENERAL APPEARANCE Comfortable, no acute issues Development: normal Gross deformities: none  SKIN Rash, lesions, ulcers: none Induration, erythema: none Nodules: none palpable  EYES Conjunctiva and lids: normal Pupils: equal and reactive  EARS, NOSE, MOUTH, THROAT External ears: no lesion or deformity External nose: no lesion or deformity Hearing: grossly normal  NECK Symmetric: yes Trachea: midline Thyroid:  no palpable nodules in the thyroid bed  CHEST Respiratory effort: normal Retraction or accessory muscle use:  no Breath sounds: normal bilaterally Rales, rhonchi, wheeze: none  CARDIOVASCULAR Auscultation: regular rhythm, normal rate Murmurs: none Pulses: radial pulse 2+ palpable Lower extremity edema: none  ABDOMEN Not assessed  GENITOURINARY/RECTAL Not assessed  MUSCULOSKELETAL Station and gait: normal Digits and nails: no clubbing or cyanosis Muscle strength: grossly normal all extremities Range of motion: grossly normal all extremities Deformity: mild tremor  LYMPHATIC Cervical: none palpable Supraclavicular: none palpable  PSYCHIATRIC Oriented to person, place, and time: yes Mood and affect: normal for situation Judgment and insight: appropriate for situation   Assessment and Plan:   Primary hyperparathyroidism (CMS-HCC)  Patient is referred by his endocrinologist, Dr. Vivia Ewing, for surgical evaluation and management of suspected primary hyperparathyroidism.  Patient provided with a copy of "Parathyroid Surgery: Treatment for Your Parathyroid Gland Problem", published by [employer], 12 pages. Book reviewed and explained to patient during visit today.  Today we reviewed his clinical history. We discussed his imaging studies and his laboratory results. I provided them with written literature on parathyroid surgery to review at home. Ultrasound shows no significant thyroid disease and no sign of parathyroid adenoma. I would like to obtain a nuclear medicine parathyroid scan with sestamibi in hopes of confirming his diagnosis and localizing the adenoma. If this is successful, then I believe he will be a good candidate for minimally invasive outpatient surgery. Today we discussed the procedure. We discussed general anesthesia. We discussed the size and location of the surgical incision. We discussed his postoperative recovery. We discussed the fact that 95% of the time this is a single gland disease. If the nuclear medicine scan fails to localize the adenoma, then we will plan to  proceed with a 4D CT scan of the neck with parathyroid protocol in hopes of identifying the adenoma.  Patient understands and agrees to proceed with the above studies. We will make arrangements and contact the patient with the results when they are available.   Armandina Gemma, MD [facility] [place] practice Office: [phone]

## 2022-03-21 ENCOUNTER — Ambulatory Visit (HOSPITAL_COMMUNITY): Payer: Medicare Other | Admitting: Physician Assistant

## 2022-03-21 ENCOUNTER — Ambulatory Visit (HOSPITAL_BASED_OUTPATIENT_CLINIC_OR_DEPARTMENT_OTHER): Payer: Medicare Other | Admitting: Certified Registered Nurse Anesthetist

## 2022-03-21 ENCOUNTER — Encounter (HOSPITAL_COMMUNITY): Payer: Self-pay | Admitting: Surgery

## 2022-03-21 ENCOUNTER — Other Ambulatory Visit: Payer: Self-pay

## 2022-03-21 ENCOUNTER — Ambulatory Visit (HOSPITAL_COMMUNITY)
Admission: RE | Admit: 2022-03-21 | Discharge: 2022-03-21 | Disposition: A | Discharge status: Home / Self Care | Payer: Medicare Other | Source: Ambulatory Visit | Attending: Surgery | Admitting: Surgery

## 2022-03-21 ENCOUNTER — Encounter (HOSPITAL_COMMUNITY)
Admission: RE | Disposition: A | Discharge status: Home / Self Care | Payer: Self-pay | Source: Ambulatory Visit | Attending: Surgery

## 2022-03-21 DIAGNOSIS — I10 Essential (primary) hypertension: Secondary | ICD-10-CM

## 2022-03-21 DIAGNOSIS — D351 Benign neoplasm of parathyroid gland: Secondary | ICD-10-CM | POA: Diagnosis not present

## 2022-03-21 DIAGNOSIS — M1990 Unspecified osteoarthritis, unspecified site: Secondary | ICD-10-CM | POA: Diagnosis not present

## 2022-03-21 DIAGNOSIS — Z79899 Other long term (current) drug therapy: Secondary | ICD-10-CM | POA: Insufficient documentation

## 2022-03-21 DIAGNOSIS — Z87891 Personal history of nicotine dependence: Secondary | ICD-10-CM | POA: Insufficient documentation

## 2022-03-21 DIAGNOSIS — Z96641 Presence of right artificial hip joint: Secondary | ICD-10-CM | POA: Insufficient documentation

## 2022-03-21 DIAGNOSIS — E210 Primary hyperparathyroidism: Secondary | ICD-10-CM | POA: Diagnosis not present

## 2022-03-21 HISTORY — PX: PARATHYROIDECTOMY: SHX19

## 2022-03-21 SURGERY — PARATHYROIDECTOMY
Anesthesia: General | Site: Neck | Laterality: Right

## 2022-03-21 MED ORDER — PHENYLEPHRINE 80 MCG/ML (10ML) SYRINGE FOR IV PUSH (FOR BLOOD PRESSURE SUPPORT)
PREFILLED_SYRINGE | INTRAVENOUS | Status: AC
  Filled 2022-03-21: qty 10

## 2022-03-21 MED ORDER — SUCCINYLCHOLINE CHLORIDE 200 MG/10ML IV SOSY
PREFILLED_SYRINGE | INTRAVENOUS | Status: AC
  Filled 2022-03-21: qty 10

## 2022-03-21 MED ORDER — ALBUTEROL SULFATE HFA 108 (90 BASE) MCG/ACT IN AERS
INHALATION_SPRAY | RESPIRATORY_TRACT | Status: AC
  Filled 2022-03-21: qty 6.7

## 2022-03-21 MED ORDER — OXYCODONE HCL 5 MG/5ML PO SOLN: 5.0000 mg | Freq: Once | ORAL | Status: AC | PRN

## 2022-03-21 MED ORDER — FENTANYL CITRATE (PF) 100 MCG/2ML IJ SOLN
INTRAMUSCULAR | Status: DC | PRN
  Administered 2022-03-21 (×2): 50 ug via INTRAVENOUS

## 2022-03-21 MED ORDER — HEMOSTATIC AGENTS (NO CHARGE) OPTIME
TOPICAL | Status: DC | PRN
  Administered 2022-03-21: 1 via TOPICAL

## 2022-03-21 MED ORDER — 0.9 % SODIUM CHLORIDE (POUR BTL) OPTIME
TOPICAL | Status: DC | PRN
  Administered 2022-03-21: 1000 mL

## 2022-03-21 MED ORDER — OXYCODONE HCL 5 MG PO TABS
ORAL_TABLET | ORAL | Status: AC
  Filled 2022-03-21: qty 1

## 2022-03-21 MED ORDER — LIDOCAINE HCL (PF) 2 % IJ SOLN
INTRAMUSCULAR | Status: AC
  Filled 2022-03-21: qty 5

## 2022-03-21 MED ORDER — LACTATED RINGERS IV SOLN: INTRAVENOUS | Status: DC

## 2022-03-21 MED ORDER — CHLORHEXIDINE GLUCONATE CLOTH 2 % EX PADS: 6.0000 | MEDICATED_PAD | Freq: Once | CUTANEOUS | Status: DC

## 2022-03-21 MED ORDER — DEXAMETHASONE SODIUM PHOSPHATE 10 MG/ML IJ SOLN
INTRAMUSCULAR | Status: AC
  Filled 2022-03-21: qty 1

## 2022-03-21 MED ORDER — BUPIVACAINE HCL 0.25 % IJ SOLN
INTRAMUSCULAR | Status: AC
  Filled 2022-03-21: qty 1

## 2022-03-21 MED ORDER — PHENYLEPHRINE 80 MCG/ML (10ML) SYRINGE FOR IV PUSH (FOR BLOOD PRESSURE SUPPORT)
PREFILLED_SYRINGE | INTRAVENOUS | Status: DC | PRN
  Administered 2022-03-21: 160 ug via INTRAVENOUS
  Administered 2022-03-21: 240 ug via INTRAVENOUS
  Administered 2022-03-21: 160 ug via INTRAVENOUS
  Administered 2022-03-21: 80 ug via INTRAVENOUS
  Administered 2022-03-21: 160 ug via INTRAVENOUS

## 2022-03-21 MED ORDER — BUPIVACAINE HCL 0.25 % IJ SOLN
INTRAMUSCULAR | Status: DC | PRN
  Administered 2022-03-21: 10 mL

## 2022-03-21 MED ORDER — ONDANSETRON HCL 4 MG/2ML IJ SOLN
INTRAMUSCULAR | Status: DC | PRN
  Administered 2022-03-21: 4 mg via INTRAVENOUS

## 2022-03-21 MED ORDER — ACETAMINOPHEN 500 MG PO TABS
1000.0000 mg | ORAL_TABLET | Freq: Once | ORAL | Status: AC
  Administered 2022-03-21: 1000 mg via ORAL
  Filled 2022-03-21: qty 2

## 2022-03-21 MED ORDER — ROCURONIUM BROMIDE 10 MG/ML (PF) SYRINGE
PREFILLED_SYRINGE | INTRAVENOUS | Status: AC
  Filled 2022-03-21: qty 10

## 2022-03-21 MED ORDER — DIPHENHYDRAMINE HCL 50 MG/ML IJ SOLN
INTRAMUSCULAR | Status: AC
  Filled 2022-03-21: qty 1

## 2022-03-21 MED ORDER — LIDOCAINE HCL (CARDIAC) PF 100 MG/5ML IV SOSY
PREFILLED_SYRINGE | INTRAVENOUS | Status: DC | PRN
  Administered 2022-03-21: 50 mg via INTRAVENOUS

## 2022-03-21 MED ORDER — EPHEDRINE 5 MG/ML INJ
INTRAVENOUS | Status: AC
  Filled 2022-03-21: qty 5

## 2022-03-21 MED ORDER — ROCURONIUM BROMIDE 100 MG/10ML IV SOLN
INTRAVENOUS | Status: DC | PRN
  Administered 2022-03-21: 60 mg via INTRAVENOUS
  Administered 2022-03-21: 10 mg via INTRAVENOUS

## 2022-03-21 MED ORDER — ACETAMINOPHEN 325 MG PO TABS: 325.0000 mg | ORAL_TABLET | ORAL | Status: DC | PRN

## 2022-03-21 MED ORDER — FENTANYL CITRATE PF 50 MCG/ML IJ SOSY: 25.0000 ug | PREFILLED_SYRINGE | INTRAMUSCULAR | Status: DC | PRN

## 2022-03-21 MED ORDER — ACETAMINOPHEN 160 MG/5ML PO SOLN: 325.0000 mg | ORAL | Status: DC | PRN

## 2022-03-21 MED ORDER — LACTATED RINGERS IV SOLN: INTRAVENOUS | Status: DC | PRN

## 2022-03-21 MED ORDER — AMISULPRIDE (ANTIEMETIC) 5 MG/2ML IV SOLN: 10.0000 mg | Freq: Once | INTRAVENOUS | Status: DC | PRN

## 2022-03-21 MED ORDER — CHLORHEXIDINE GLUCONATE 0.12 % MT SOLN
15.0000 mL | Freq: Once | OROMUCOSAL | Status: AC
  Administered 2022-03-21: 15 mL via OROMUCOSAL

## 2022-03-21 MED ORDER — DEXAMETHASONE SODIUM PHOSPHATE 10 MG/ML IJ SOLN
INTRAMUSCULAR | Status: DC | PRN
  Administered 2022-03-21: 10 mg via INTRAVENOUS

## 2022-03-21 MED ORDER — IPRATROPIUM-ALBUTEROL 0.5-2.5 (3) MG/3ML IN SOLN
RESPIRATORY_TRACT | Status: AC
  Filled 2022-03-21: qty 3

## 2022-03-21 MED ORDER — PROPOFOL 10 MG/ML IV BOLUS
INTRAVENOUS | Status: DC | PRN
  Administered 2022-03-21: 30 mg via INTRAVENOUS
  Administered 2022-03-21: 140 mg via INTRAVENOUS

## 2022-03-21 MED ORDER — SUGAMMADEX SODIUM 200 MG/2ML IV SOLN
INTRAVENOUS | Status: DC | PRN
  Administered 2022-03-21: 200 mg via INTRAVENOUS

## 2022-03-21 MED ORDER — IPRATROPIUM-ALBUTEROL 0.5-2.5 (3) MG/3ML IN SOLN
3.0000 mL | RESPIRATORY_TRACT | Status: DC
  Administered 2022-03-21: 3 mL via RESPIRATORY_TRACT

## 2022-03-21 MED ORDER — LIDOCAINE HCL (PF) 2 % IJ SOLN
INTRAMUSCULAR | Status: AC
  Filled 2022-03-21: qty 15

## 2022-03-21 MED ORDER — ACETAMINOPHEN 10 MG/ML IV SOLN: 1000.0000 mg | Freq: Once | INTRAVENOUS | Status: DC | PRN

## 2022-03-21 MED ORDER — OXYCODONE HCL 5 MG PO TABS
5.0000 mg | ORAL_TABLET | Freq: Once | ORAL | Status: AC | PRN
  Administered 2022-03-21: 5 mg via ORAL

## 2022-03-21 MED ORDER — TRAMADOL HCL 50 MG PO TABS: 50.0000 mg | ORAL_TABLET | Freq: Four times a day (QID) | ORAL | 0 refills | Status: DC | PRN

## 2022-03-21 MED ORDER — ROCURONIUM BROMIDE 10 MG/ML (PF) SYRINGE
PREFILLED_SYRINGE | INTRAVENOUS | Status: AC
  Filled 2022-03-21: qty 20

## 2022-03-21 MED ORDER — ORAL CARE MOUTH RINSE: 15.0000 mL | Freq: Once | OROMUCOSAL | Status: AC

## 2022-03-21 MED ORDER — PROMETHAZINE HCL 25 MG/ML IJ SOLN: 6.2500 mg | INTRAMUSCULAR | Status: DC | PRN

## 2022-03-21 MED ORDER — FENTANYL CITRATE (PF) 100 MCG/2ML IJ SOLN
INTRAMUSCULAR | Status: AC
  Filled 2022-03-21: qty 2

## 2022-03-21 MED ORDER — CEFAZOLIN SODIUM-DEXTROSE 2-4 GM/100ML-% IV SOLN
2.0000 g | INTRAVENOUS | Status: AC
  Administered 2022-03-21: 2 g via INTRAVENOUS
  Filled 2022-03-21: qty 100

## 2022-03-21 SURGICAL SUPPLY — 35 items
ADH SKN CLS APL DERMABOND .7 (GAUZE/BANDAGES/DRESSINGS) ×1
APL PRP STRL LF DISP 70% ISPRP (MISCELLANEOUS) ×1
ATTRACTOMAT 16X20 MAGNETIC DRP (DRAPES) ×1 IMPLANT
BAG COUNTER SPONGE SURGICOUNT (BAG) ×1 IMPLANT
BAG SPNG CNTER NS LX DISP (BAG)
BLADE SURG 15 STRL LF DISP TIS (BLADE) ×1 IMPLANT
BLADE SURG 15 STRL SS (BLADE) ×1
CHLORAPREP W/TINT 26 (MISCELLANEOUS) ×1 IMPLANT
CLIP TI MEDIUM 6 (CLIP) ×2 IMPLANT
CLIP TI WIDE RED SMALL 6 (CLIP) ×2 IMPLANT
COVER SURGICAL LIGHT HANDLE (MISCELLANEOUS) ×1 IMPLANT
DERMABOND ADVANCED .7 DNX12 (GAUZE/BANDAGES/DRESSINGS) ×1 IMPLANT
DRAPE LAPAROTOMY T 98X78 PEDS (DRAPES) ×1 IMPLANT
DRAPE UTILITY XL STRL (DRAPES) ×1 IMPLANT
ELECT REM PT RETURN 15FT ADLT (MISCELLANEOUS) ×1 IMPLANT
GAUZE 4X4 16PLY ~~LOC~~+RFID DBL (SPONGE) ×1 IMPLANT
GLOVE SURG ORTHO 8.0 STRL STRW (GLOVE) ×1 IMPLANT
GOWN STRL REUS W/ TWL XL LVL3 (GOWN DISPOSABLE) ×3 IMPLANT
GOWN STRL REUS W/TWL XL LVL3 (GOWN DISPOSABLE) ×3
HEMOSTAT SURGICEL 2X4 FIBR (HEMOSTASIS) ×1 IMPLANT
ILLUMINATOR WAVEGUIDE N/F (MISCELLANEOUS) IMPLANT
KIT BASIN OR (CUSTOM PROCEDURE TRAY) ×1 IMPLANT
KIT TURNOVER KIT A (KITS) IMPLANT
NDL HYPO 25X1 1.5 SAFETY (NEEDLE) ×1 IMPLANT
NEEDLE HYPO 25X1 1.5 SAFETY (NEEDLE) ×1 IMPLANT
PACK BASIC VI WITH GOWN DISP (CUSTOM PROCEDURE TRAY) ×1 IMPLANT
PENCIL SMOKE EVACUATOR (MISCELLANEOUS) ×1 IMPLANT
SHEARS HARMONIC 9CM CVD (BLADE) IMPLANT
SUT MNCRL AB 4-0 PS2 18 (SUTURE) ×1 IMPLANT
SUT VIC AB 3-0 SH 18 (SUTURE) ×1 IMPLANT
SYR BULB IRRIG 60ML STRL (SYRINGE) ×1 IMPLANT
SYR CONTROL 10ML LL (SYRINGE) ×1 IMPLANT
TOWEL OR 17X26 10 PK STRL BLUE (TOWEL DISPOSABLE) ×1 IMPLANT
TOWEL OR NON WOVEN STRL DISP B (DISPOSABLE) ×1 IMPLANT
TUBING CONNECTING 10 (TUBING) ×1 IMPLANT

## 2022-03-21 NOTE — Anesthesia Postprocedure Evaluation (Signed)
Anesthesia Post Note  Patient: [name]  Procedure(s) Performed: RIGHT INFERIOR PARATHYROIDECTOMY (Right: Neck)     Patient location during evaluation: PACU Anesthesia Type: General Level of consciousness: awake and alert Pain management: pain level controlled Vital Signs Assessment: post-procedure vital signs reviewed and stable Respiratory status: spontaneous breathing, nonlabored ventilation, respiratory function stable and patient connected to nasal cannula oxygen Cardiovascular status: blood pressure returned to baseline and stable Postop Assessment: no apparent nausea or vomiting Anesthetic complications: no  No notable events documented.  Last Vitals:  Vitals:   03/21/22 1430 03/21/22 1448  BP: 133/72 (!) 142/73  Pulse: 75 75  Resp: 15 14  Temp: 36.5 C 36.5 C  SpO2: 97% 97%    Last Pain:  Vitals:   03/21/22 1448  TempSrc:   PainSc: 0-No pain                 Effie Berkshire

## 2022-03-21 NOTE — Anesthesia Procedure Notes (Signed)
Procedure Name: Intubation Date/Time: 03/21/2022 9:56 AM  Performed by: [place] Manus Rudd, CRNAPre-anesthesia Checklist: Patient identified, Emergency Drugs available, Suction available and Patient being monitored Patient Re-evaluated:Patient Re-evaluated prior to induction Oxygen Delivery Method: Circle system utilized Preoxygenation: Pre-oxygenation with 100% oxygen Induction Type: IV induction Ventilation: Mask ventilation without difficulty Laryngoscope Size: Mac and 4 Grade View: Grade III Tube type: Oral Tube size: 7.5 mm Number of attempts: 1 Airway Equipment and Method: Stylet and Oral airway Placement Confirmation: ETT inserted through vocal cords under direct vision, positive ETCO2 and breath sounds checked- equal and bilateral Secured at: 22 cm Tube secured with: Tape Dental Injury: Teeth and Oropharynx as per pre-operative assessment

## 2022-03-21 NOTE — Op Note (Signed)
OPERATIVE REPORT - PARATHYROIDECTOMY  Preoperative diagnosis: Primary hyperparathyroidism  Postop diagnosis: Same  Procedure: Right inferior minimally invasive parathyroidectomy (hypercellular) and right superior parathyroidectomy (adenoma)  Surgeon:  Armandina Gemma, MD  Anesthesia: General endotracheal  Estimated blood loss: Minimal  Preparation: ChloraPrep  Indications: Patient is referred by Dr. Vivia Ewing for surgical evaluation and management of suspected primary hyperparathyroidism. Patient had been noted on routine laboratory testing to have elevated serum calcium levels. His most recent level was elevated at 11.7. Patient had undergone intact PTH testing with levels ranging from 93-103. 25 hydroxy vitamin D level was normal at 62.6. 24-hour urine collection for calcium was normal at 223. Patient denies any significant symptoms. He denies fatigue. He has no history of nephrolithiasis. Patient did have a bone density scan which shows osteopenia. Patient denies any significant bone and joint pain but has had a right hip replacement by Dr. Zollie Beckers. Patient denies any previous head or neck surgery. There is no family history of parathyroid disease. Patient presents today accompanied by his wife. Patient had undergone ultrasound examination on December 02, 2021. This showed a normal-sized thyroid gland without significant nodules. An abnormal parathyroid gland was not identified.  Nuclear medicine parathyroid scan with sestamibi localized a right inferior parathyroid adenoma.  Patient now comes to surgery for parathyroidectomy.  Procedure: The patient was prepared in the pre-operative holding area. The patient was brought to the operating room and placed in a supine position on the operating room table. Following administration of general anesthesia, the patient was positioned and then prepped and draped in the usual strict aseptic fashion. After ascertaining that an adequate level of  anesthesia been achieved, a neck incision was made with a #15 blade. Dissection was carried through subcutaneous tissues and platysma. Hemostasis was obtained with the electrocautery. Skin flaps were developed circumferentially and a Weitlander retractor was placed for exposure.  Strap muscles were incised in the midline. Strap muscles were reflected laterally exposing the thyroid lobe. With gentle blunt dissection the thyroid lobe was mobilized.  Dissection was carried posteriorly and an enlarged parathyroid gland was identified. It was gently mobilized. Vascular structures were divided between small ligaclips. Care was taken to avoid the recurrent laryngeal nerve. The parathyroid gland was completely excised.  This gland was located inferior to the inferior pole of the right thyroid lobe.  While it was mildly enlarged it was grossly normal in appearance.  Frozen section biopsy confirmed hypercellular parathyroid tissue.  While this gland may have been mildly hypercellular, it did not appear to agree with the significant laboratory abnormalities.  Therefore further dissection was performed on the right side.  The entire right thyroid lobe was mobilized.  A right superior gland was not clearly identified.  Exploration was then carried posteriorly to the precervical fascia.  Exploration was then carried inferiorly along the edge of the esophagus until a markedly enlarged parathyroid gland was identified.  This was dissected out of the posterior mediastinum.  It was delivered up and into the neck.  This appeared to be a very large parathyroid gland measuring approximately 4 cm x 2 cm x 1.5 cm in size.  It was dissected down to its vascular pedicle which was divided between medium ligaclips with the Metzenbaum scissors and the gland was excised.  It was submitted to pathology where frozen section confirmed hypercellular parathyroid tissue consistent with adenoma.  Neck was irrigated with warm saline and good  hemostasis was noted. Fibrillar was placed in the operative field. Strap muscles  were approximated in the midline with interrupted 3-0 Vicryl sutures. Platysma was closed with interrupted 3-0 Vicryl sutures. Marcaine was infiltrated circumferentially. Skin was closed with a running 4-0 Monocryl subcuticular suture. Wound was washed and dried and Dermabond was applied. Patient was awakened from anesthesia and brought to the recovery room. The patient tolerated the procedure well.   Armandina Gemma, [facility] Office: [phone]

## 2022-03-21 NOTE — Anesthesia Preprocedure Evaluation (Addendum)
Anesthesia Evaluation  Patient identified by MRN, date of birth, ID band Patient awake    Reviewed: Allergy & Precautions, NPO status , Patient's Chart, lab work & pertinent test results  Airway Mallampati: II  TM Distance: <3 FB Neck ROM: Full    Dental  (+) Teeth Intact, Caps, Dental Advisory Given   Pulmonary former smoker    + decreased breath sounds      Cardiovascular hypertension, Pt. on medications  Rhythm:Regular Rate:Normal     Neuro/Psych negative neurological ROS  negative psych ROS   GI/Hepatic Neg liver ROS,GERD  ,,  Endo/Other  negative endocrine ROS    Renal/GU negative Renal ROS     Musculoskeletal  (+) Arthritis ,    Abdominal   Peds  Hematology negative hematology ROS (+)   Anesthesia Other Findings   Reproductive/Obstetrics                             Anesthesia Physical Anesthesia Plan  ASA: 3  Anesthesia Plan: General   Post-op Pain Management: Tylenol PO (pre-op)*   Induction: Intravenous  PONV Risk Score and Plan: 3 and Ondansetron, Dexamethasone and Midazolam  Airway Management Planned: Oral ETT  Additional Equipment: None  Intra-op Plan:   Post-operative Plan: Extubation in OR  Informed Consent: I have reviewed the patients History and Physical, chart, labs and discussed the procedure including the risks, benefits and alternatives for the proposed anesthesia with the patient or authorized representative who has indicated his/her understanding and acceptance.     Dental advisory given  Plan Discussed with:   Anesthesia Plan Comments:        Anesthesia Quick Evaluation

## 2022-03-21 NOTE — Discharge Instructions (Addendum)
[facility] - Dr. Todd Gerkin  THYROID & PARATHYROID SURGERY:  POST-OP INSTRUCTIONS  Always review the instruction sheet provided by the hospital nurse at discharge.  A prescription for pain medication may be sent to your pharmacy at the time of discharge.  Take your pain medication as prescribed.  If narcotic pain medicine is not needed, then you may take acetaminophen (Tylenol) or ibuprofen (Advil) as needed for pain or soreness.  Take your normal home medications as prescribed unless otherwise directed.  If you need a refill on your pain medication, please contact the office during regular business hours.  Prescriptions will not be processed by the office after 5:00PM or on weekends.  Start with a light diet upon arrival home, such as soup and crackers or toast.  Be sure to drink plenty of fluids.  Resume your normal diet the day after surgery.  Most patients will experience some swelling and bruising on the chest and neck area.  Ice packs will help for the first 48 hours after arriving home.  Swelling and bruising will take several days to resolve.   It is common to experience some constipation after surgery.  Increasing fluid intake and taking a stool softener (Colace) will usually help to prevent this problem.  A mild laxative (Milk of Magnesia or Miralax) should be taken according to package directions if there has been no bowel movement after 48 hours.  Dermabond glue covers your incision. This seals the wound and you may shower at any time. The Dermabond will remain in place for about a week.  You may gradually remove the glue when it loosens around the edges.  If you need to loosen the Dermabond for removal, apply a layer of Vaseline to the wound for 15 minutes and then remove with a Kleenex. Your sutures are under the skin and will not show - they will dissolve on their own.  You may resume light daily activities beginning the day after discharge (such as self-care,  walking, climbing stairs), gradually increasing activities as tolerated. You may have sexual intercourse when it is comfortable. Refrain from any heavy lifting or straining until approved by your doctor. You may drive when you no longer are taking prescription pain medication, you can comfortably wear a seatbelt, and you can safely maneuver your car and apply the brakes.  You will see your doctor in the office for a follow-up appointment approximately three weeks after your surgery.  Make sure that you call for this appointment within a day or two after you arrive home to insure a convenient appointment time. Please have any requested laboratory tests performed a few days prior to your office visit so that the results will be available at your follow up appointment.  WHEN TO CALL THE [facility] OFFICE: -- Fever greater than 101.5 -- Inability to urinate -- Nausea and/or vomiting - persistent -- Extreme swelling or bruising -- Continued bleeding from incision -- Increased pain, redness, or drainage from the incision -- Difficulty swallowing or breathing -- Muscle cramping or spasms -- Numbness or tingling in hands or around lips  The clinic staff is available to answer your questions during regular business hours.  Please don't hesitate to call and ask to speak to one of the nurses if you have concerns.  [facility] OFFICE: [phone] (24 hours)  Please sign up for MyChart accounts. This will allow you to communicate directly with my nurse or myself without having to call the office. It will also allow you   to view your test results. You will need to enroll in MyChart for my office ([facility]) and for the hospital ().  Todd Gerkin, MD [facility] practice 

## 2022-03-21 NOTE — Interval H&P Note (Signed)
History and Physical Interval Note:  03/21/2022 9:23 AM  [name]  has presented today for surgery, with the diagnosis of PRIMARY HYPERPARATHYROIDISM.  The various methods of treatment have been discussed with the patient and family. After consideration of risks, benefits and other options for treatment, the patient has consented to    Procedure(s): RIGHT INFERIOR PARATHYROIDECTOMY (Right) as a surgical intervention.    The patient's history has been reviewed, patient examined, no change in status, stable for surgery.  I have reviewed the patient's chart and labs.  Questions were answered to the patient's satisfaction.    Armandina Gemma, [place] [facility] [place] practice Office: [place]

## 2022-03-21 NOTE — Transfer of Care (Signed)
Immediate Anesthesia Transfer of Care Note  Patient: [name]  Procedure(s) Performed: RIGHT INFERIOR PARATHYROIDECTOMY (Right: Neck)  Patient Location: PACU  Anesthesia Type:General  Level of Consciousness: awake, alert , and oriented  Airway & Oxygen Therapy: Patient Spontanous Breathing and Patient connected to face mask oxygen  Post-op Assessment: Report given to RN and Post -op Vital signs reviewed and stable  Post vital signs: Reviewed and stable  Last Vitals:  Vitals Value Taken Time  BP 141/96 03/21/22 1206  Temp    Pulse 65 03/21/22 1207  Resp 12 03/21/22 1207  SpO2 95 % 03/21/22 1207  Vitals shown include unvalidated device data.  Last Pain:  Vitals:   03/21/22 0901  TempSrc:   PainSc: 0-No pain         Complications: No notable events documented.

## 2022-03-22 ENCOUNTER — Encounter (HOSPITAL_COMMUNITY): Payer: Self-pay | Admitting: Surgery

## 2022-03-22 LAB — SURGICAL PATHOLOGY

## 2022-03-29 ENCOUNTER — Ambulatory Visit (INDEPENDENT_AMBULATORY_CARE_PROVIDER_SITE_OTHER): Payer: Medicare Other

## 2022-03-29 DIAGNOSIS — I10 Essential (primary) hypertension: Secondary | ICD-10-CM | POA: Diagnosis not present

## 2022-03-29 DIAGNOSIS — I493 Ventricular premature depolarization: Secondary | ICD-10-CM

## 2022-03-29 LAB — ECHOCARDIOGRAM COMPLETE
Area-P 1/2: 3.08 cm2
MV M vel: 3.77 m/s
MV Peak grad: 56.7 mmHg
S' Lateral: 3.08 cm

## 2022-04-04 DIAGNOSIS — Z86010 Personal history of colonic polyps: Secondary | ICD-10-CM | POA: Diagnosis not present

## 2022-04-04 DIAGNOSIS — R1084 Generalized abdominal pain: Secondary | ICD-10-CM | POA: Diagnosis not present

## 2022-04-04 DIAGNOSIS — R195 Other fecal abnormalities: Secondary | ICD-10-CM | POA: Diagnosis not present

## 2022-04-08 DIAGNOSIS — E210 Primary hyperparathyroidism: Secondary | ICD-10-CM | POA: Diagnosis not present

## 2022-04-08 DIAGNOSIS — E892 Postprocedural hypoparathyroidism: Secondary | ICD-10-CM | POA: Diagnosis not present

## 2022-04-15 ENCOUNTER — Encounter (HOSPITAL_BASED_OUTPATIENT_CLINIC_OR_DEPARTMENT_OTHER): Payer: Self-pay | Admitting: Cardiology

## 2022-04-15 ENCOUNTER — Ambulatory Visit (INDEPENDENT_AMBULATORY_CARE_PROVIDER_SITE_OTHER): Payer: Medicare Other | Admitting: Cardiology

## 2022-04-15 VITALS — BP 144/90 | HR 80 | Ht 72.0 in | Wt 201.0 lb

## 2022-04-15 DIAGNOSIS — I493 Ventricular premature depolarization: Secondary | ICD-10-CM | POA: Diagnosis not present

## 2022-04-15 DIAGNOSIS — I10 Essential (primary) hypertension: Secondary | ICD-10-CM | POA: Diagnosis not present

## 2022-04-15 DIAGNOSIS — E7800 Pure hypercholesterolemia, unspecified: Secondary | ICD-10-CM

## 2022-04-15 DIAGNOSIS — I4510 Unspecified right bundle-branch block: Secondary | ICD-10-CM | POA: Diagnosis not present

## 2022-04-15 MED ORDER — METOPROLOL SUCCINATE ER 25 MG PO TB24: 25.0000 mg | ORAL_TABLET | Freq: Every day | ORAL | 3 refills | Status: DC

## 2022-04-15 NOTE — Progress Notes (Signed)
Cardiology Office Note:    Date:  04/15/2022   ID:  [name], DOB 1945/11/10, MRN [id number]  PCP:  Vivi Barrack, MD   [facility] Cardiologist:  Buford Dresser, MD     Referring MD: Vivi Barrack, MD   CC: Follow-up for RBBB and PVCs  History of Present Illness:    [name] is a 77 y.o. male with a hx of HTN, HLD, RBBB and GERD presenting to clinic today for follow-up. He was initially seen 03/04/2022 at the request of his PCP Dr. Dimas Chyle for RBBB and PVCs.  Patient denies any PMHx of MI or stroke. He denies any family history of heart disease. He denies any other history of abnormal heart rhythms. He denies any history of congenital heart problems.  He had a colonoscopy on 02/17/22 and was told by a nurse afterward that he had an irregular heartbeat during the procedure. Patient then saw his PCP Dr. Jerline Pain who explained to him that he did not have an irregular rhythm only PVCs and RBBB which he had on prior EKGs. Dr. Jerline Pain discontinued his Norvasc and switched him to Losartan at that time.  At his last visit he reported home blood pressures of 130s/70s. There was no further leg swelling since discontinuing Norvasc; tolerated losartan. In 2014 pt had an EKG which showed 1 PAC. We discussed his previous EKG findings including RBBB which was noted on prior EKG from 2011. Patient noted that he was entirely asymptomatic with his skipped beats. He underwent right inferior parathyroidectomy surgery on 03/21/22 with Dr. Harlow Asa. At his surgery follow-up 1/31 he was doing well.  He had an echocardiogram 03/29/22 showing LVEF 60-65%, moderate LVH, and grade 1 diastolic dysfunction. Also mild to moderate mitral valve regurgitation, and borderline dilatation of the aortic root at 37 mm. He wore a monitor 03/2022 revealing predominant underlying rhythm was sinus rhythm. Bundle Branch Block/IVCD was present. 244 Ventricular Tachycardia runs occurred,  the run with the fastest interval lasting 5 beats. 7 Supraventricular Tachycardia runs occurred. Ventricular bigeminy and trigeminy were present. PVCs (18.8%) were more common during typical awake hours. No patient triggered events.   Today, he is accompanied by his wife. He reports normal calcium levels and no difference in his palpitations since his parathyroidectomy.   Of note he developed skin irritation from wearing his monitor which was painful for several days. Today we reviewed the results of his monitor in detail.  He states that his blood pressure has sometimes been higher at home than at his office visits. In clinic today BP is 144/90.  He denies any chest pain, shortness of breath, or peripheral edema. No lightheadedness, headaches, syncope, orthopnea, or PND.   Past Medical History:  Diagnosis Date   Arthritis    Cancer ([place])    skin   COLONIC POLYPS, HX OF 09/13/2007   Qualifier: Diagnosis of  By: Linna Darner MD, Gwyndolyn Saxon   Dr Clarene Essex, GI Colonoscopy @ 5 year intervals     DUODENAL ULCER, HX OF 09/13/2007   Qualifier: Diagnosis of  By: Linna Darner MD, Gwyndolyn Saxon     GERD (gastroesophageal reflux disease)    Hyperlipidemia    Hypertension    Right bundle branch block 02/21/2022   SKIN CANCER, HX OF 09/01/2009   Qualifier: Diagnosis of  By: Linna Darner MD, Fidela [place] Cell  X 3-4; [place]  [facility]     Ulcer 1989 & 2000   bleedng X2  Past Surgical History:  Procedure Laterality Date   colonoscopy with polypectomy     Dr Watt Climes   EYE SURGERY     post trauma with glss fragments   INGUINAL HERNIA REPAIR  1970   PARATHYROIDECTOMY Right 03/21/2022   Procedure: RIGHT INFERIOR PARATHYROIDECTOMY;  Surgeon: Armandina Gemma, MD;  Location: [facility];  Service: General;  Laterality: Right;   pilonidal cystectomy     TOTAL HIP ARTHROPLASTY Right 12/06/2019   Procedure: RIGHT TOTAL HIP ARTHROPLASTY ANTERIOR APPROACH;  Surgeon: Mcarthur Rossetti, MD;  Location: [facility];  Service: Orthopedics;   Laterality: Right;    Current Medications: Current Meds  Medication Sig   atorvastatin (LIPITOR) 40 MG tablet TAKE 1 TABLET BY MOUTH EVERY DAY   losartan (COZAAR) 100 MG tablet Take 1 tablet (100 mg total) by mouth daily.   tadalafil (CIALIS) 20 MG tablet TAKE 1 TABLET BY MOUTH AS NEEDED FOR ERECTILE DYSFUNCTION     Allergies:   Advil [ibuprofen] and Choline fenofibrate   Social History   Socioeconomic History   Marital status: Married    Spouse name: Not on file   Number of children: 2   Years of education: 16   Highest education level: Not on file  Occupational History   Occupation: Retired   Tobacco Use   Smoking status: Former   Smokeless tobacco: Former    Types: Nurse, children's Use: Never used  Substance and Sexual Activity   Alcohol use: No   Drug use: No   Sexual activity: Not on file  Other Topics Concern   Not on file  Social History Narrative   Not on file   Social Determinants of Health   Financial Resource Strain: Low Risk  (04/08/2021)   Overall Financial Resource Strain (CARDIA)    Difficulty of Paying Living Expenses: Not hard at all  Food Insecurity: No Food Insecurity (04/08/2021)   Hunger Vital Sign    Worried About Running Out of Food in the Last Year: Never true    [place] in the Last Year: Never true  Transportation Needs: No Transportation Needs (04/08/2021)   PRAPARE - Hydrologist (Medical): No    Lack of Transportation (Non-Medical): No  Physical Activity: Inactive (04/08/2021)   Exercise Vital Sign    Days of Exercise per Week: 0 days    Minutes of Exercise per Session: 0 min  Stress: No Stress Concern Present (04/08/2021)   [place]    Feeling of Stress : Not at all  Social Connections: Unknown (04/08/2021)   Social Connection and Isolation Panel [NHANES]    Frequency of Communication with Friends and Family: Twice a week     Frequency of Social Gatherings with Friends and Family: Not on file    Attends Religious Services: More than 4 times per year    Active Member of [employer] or Organizations: Yes    Attends Archivist Meetings: 1 to 4 times per year    Marital Status: Married     Family History: The patient's family history includes Diabetes in his brother; Hyperlipidemia in his brother; Hypertension in his brother; Stroke in his mother. There is no history of Heart disease or Cancer.  ROS:   Please see the history of present illness.    All other systems reviewed and are negative.  EKGs/Labs/Other Studies Reviewed:    The following studies were reviewed  today:  Echo  03/29/2022:  1. Left ventricular ejection fraction, by estimation, is 60 to 65%. Left  ventricular ejection fraction by 3D volume is 63 %. The left ventricle has  normal function. The left ventricle has no regional wall motion  abnormalities. There is moderate  concentric left ventricular hypertrophy. Left ventricular diastolic  parameters are consistent with Grade I diastolic dysfunction (impaired  relaxation).   2. Right ventricular systolic function is normal. The right ventricular  size is normal.   3. The mitral valve is normal in structure. Mild to moderate mitral valve  regurgitation. No evidence of mitral stenosis.   4. The aortic valve is normal in structure. Aortic valve regurgitation is  not visualized. No aortic stenosis is present.   5. There is borderline dilatation of the aortic root, measuring 37 mm.  There is borderline dilatation of the ascending aorta, measuring 37 mm.   6. The inferior vena cava is normal in size with greater than 50%  respiratory variability, suggesting right atrial pressure of 3 mmHg.   Monitor 03/2022: Patch Wear Time:  7 days and 2 hours   Patient had a min HR of 41 bpm, max HR of 222 bpm, and avg HR of 77 bpm. Predominant underlying rhythm was Sinus Rhythm. Bundle Branch Block/IVCD  was present. 244 Ventricular Tachycardia runs occurred, the run with the fastest interval lasting 5 beats with a max rate of 222 bpm, the longest lasting 5 beats with an avg rate of 111 bpm. 7 Supraventricular Tachycardia runs occurred, the run with the fastest interval lasting 5 beats with a max rate of 160 bpm, the longest lasting 18 beats with an avg rate of 132 bpm.  Isolated SVEs were rare (<1%). Isolated VEs were frequent (18.8%, [id number]), VE Couplets were frequent (6.7%, [id number]), and VE Triplets were rare (<1.0%, 2102). Ventricular Bigeminy and Trigeminy were present. There were no patient triggered events. PVCs more common during typical awake hours than typical sleeping hours.   EKG:  EKG is personally reviewed.  04/15/2022: EKG was not ordered. 02/21/22: SR at 70 bpm, RBBB, 2 PVCs EKG from 2011 showing RBBB. EKG from 2014 showing 1 PAC.  Recent Labs: 11/04/2021: ALT 11; TSH 1.70 03/10/2022: BUN 12; Creatinine, Ser 1.13; Hemoglobin 15.6; Platelets 232; Potassium 3.8; Sodium 141   Recent Lipid Panel    Component Value Date/Time   CHOL 112 11/04/2021 1452   TRIG 170.0 (H) 11/04/2021 1452   HDL 28.60 (L) 11/04/2021 1452   CHOLHDL 4 11/04/2021 1452   VLDL 34.0 11/04/2021 1452   LDLCALC 50 11/04/2021 1452   LDLCALC 83 10/24/2019 1402   LDLDIRECT 143.8 04/18/2012 1153   Physical Exam:    VS:  BP (!) 144/90   Pulse 80   Ht 6' (1.829 m)   Wt 201 lb (91.2 kg)   BMI 27.26 kg/m     Wt Readings from Last 3 Encounters:  04/15/22 201 lb (91.2 kg)  03/21/22 194 lb 0.1 oz (88 kg)  03/10/22 196 lb (88.9 kg)     GEN:  Well nourished, well developed in no acute distress HEENT: Normal NECK: No JVD; No carotid bruits LYMPHATICS: No lymphadenopathy CARDIAC: RRR with occasional ectopy every 4th to 5th beat, no murmurs, rubs, gallops RESPIRATORY:  Clear to auscultation without rales, wheezing or rhonchi  ABDOMEN: Soft, non-tender, non-distended MUSCULOSKELETAL:  No edema; No deformity   SKIN: Warm and dry NEUROLOGIC:  Alert and oriented x 3 PSYCHIATRIC:  Normal affect   ASSESSMENT:  1. PVC (premature ventricular contraction)   2. RBBB   3. Essential hypertension   4. Pure hypercholesterolemia     PLAN:    PVCs RBBB -see monitor, echo as above. EF normal, PVC burden 19% with 244 NSVT episodes -will start metoprolol -discussed Kardia today  Hypertension -continue losartan -starting metoprolol as above  Hypercholesterolemia -continue atorvastatin  Follow up: 3 months or sooner if needed  Medication Adjustments/Labs and Tests Ordered: Current medicines are reviewed at length with the patient today.  Concerns regarding medicines are outlined above.   No orders of the defined types were placed in this encounter.  Meds ordered this encounter  Medications   metoprolol succinate (TOPROL-XL) 25 MG 24 hr tablet    Sig: Take 1 tablet (25 mg total) by mouth daily. Take with or immediately following a meal.    Dispense:  90 tablet    Refill:  3   Patient Instructions  Medication Instructions:  Your physician has recommended you make the following change in your medication:   Start: Metoprolol Succinate '25mg'$  daily   *If you need a refill on your cardiac medications before your next appointment, please call your pharmacy*  Follow-Up: At [facility], you and your health needs are our priority.  As part of our continuing mission to provide you with exceptional heart care, we have created designated Provider Care Teams.  These Care Teams include your primary Cardiologist (physician) and Advanced Practice Providers (APPs -  Physician Assistants and Nurse Practitioners) who all work together to provide you with the care you need, when you need it.  We recommend signing up for the patient portal called "MyChart".  Sign up information is provided on this After Visit Summary.  MyChart is used to connect with patients for Virtual Visits (Telemedicine).   Patients are able to view lab/test results, encounter notes, upcoming appointments, etc.  Non-urgent messages can be sent to your provider as well.   To learn more about what you can do with MyChart, go to [email or web address].    Your next appointment:   3 month(s)  Provider:   Buford Dresser, MD    Other Instructions Kardia for monitoring of EKGs at home: You can look into the [facility] device by [employer].This device is purchased by you and it connects to an application you download to your smart phone.  It can detect abnormal heart rhythms and alert you to contact your doctor for further evaluation. The device is approximately $90 and the phone application is free.  The web site is:  https://www.alivecor.com      I,Mathew Stumpf,acting as a Education administrator for [employer], MD.,have documented all relevant documentation on the behalf of Buford Dresser, MD,as directed by  Buford Dresser, MD while in the presence of Buford Dresser, MD.  I, Buford Dresser, MD, have reviewed all documentation for this visit. The documentation on 04/15/22 for the exam, diagnosis, procedures, and orders are all accurate and complete.   Signed, Buford Dresser, MD  04/15/2022     [place]

## 2022-04-15 NOTE — Patient Instructions (Signed)
Medication Instructions:  Your physician has recommended you make the following change in your medication:   Start: Metoprolol Succinate '25mg'$  daily   *If you need a refill on your cardiac medications before your next appointment, please call your pharmacy*  Follow-Up: At [facility], you and your health needs are our priority.  As part of our continuing mission to provide you with exceptional heart care, we have created designated Provider Care Teams.  These Care Teams include your primary Cardiologist (physician) and Advanced Practice Providers (APPs -  Physician Assistants and Nurse Practitioners) who all work together to provide you with the care you need, when you need it.  We recommend signing up for the patient portal called "MyChart".  Sign up information is provided on this After Visit Summary.  MyChart is used to connect with patients for Virtual Visits (Telemedicine).  Patients are able to view lab/test results, encounter notes, upcoming appointments, etc.  Non-urgent messages can be sent to your provider as well.   To learn more about what you can do with MyChart, go to [email or web address].    Your next appointment:   3 month(s)  Provider:   Buford Dresser, MD    Other Instructions Kardia for monitoring of EKGs at home: You can look into the [facility] device by [employer].This device is purchased by you and it connects to an application you download to your smart phone.  It can detect abnormal heart rhythms and alert you to contact your doctor for further evaluation. The device is approximately $90 and the phone application is free.  The web site is:  https://www.alivecor.com

## 2022-04-27 DIAGNOSIS — H6123 Impacted cerumen, bilateral: Secondary | ICD-10-CM | POA: Diagnosis not present

## 2022-05-18 ENCOUNTER — Encounter (HOSPITAL_BASED_OUTPATIENT_CLINIC_OR_DEPARTMENT_OTHER): Payer: Self-pay | Admitting: Cardiology

## 2022-05-25 ENCOUNTER — Encounter: Payer: Self-pay | Admitting: Internal Medicine

## 2022-05-25 ENCOUNTER — Ambulatory Visit (INDEPENDENT_AMBULATORY_CARE_PROVIDER_SITE_OTHER): Payer: Medicare Other | Admitting: Internal Medicine

## 2022-05-25 VITALS — BP 128/70 | HR 60 | Ht 72.0 in | Wt 197.0 lb

## 2022-05-25 DIAGNOSIS — E210 Primary hyperparathyroidism: Secondary | ICD-10-CM

## 2022-05-25 LAB — BASIC METABOLIC PANEL
BUN: 12 mg/dL (ref 6–23)
CO2: 27 mEq/L (ref 19–32)
Calcium: 8.9 mg/dL (ref 8.4–10.5)
Chloride: 107 mEq/L (ref 96–112)
Creatinine, Ser: 1.09 mg/dL (ref 0.40–1.50)
GFR: 66.92 mL/min (ref >60.00)
Glucose, Bld: 93 mg/dL (ref 70–99)
Potassium: 3.4 mEq/L — ABNORMAL LOW (ref 3.5–5.1)
Sodium: 141 mEq/L (ref 135–145)

## 2022-05-25 LAB — ALBUMIN: Albumin: 3.9 g/dL (ref 3.5–5.2)

## 2022-05-25 LAB — VITAMIN D 25 HYDROXY (VIT D DEFICIENCY, FRACTURES): VITD: 30.66 ng/mL (ref 30.00–100.00)

## 2022-05-25 NOTE — Progress Notes (Signed)
Name: [name]  MRN/ DOB: [id number], [birth year]    Age/ Sex: 77 y.o., male    PCP: Steven Barrack, MD   Reason for Endocrinology Evaluation: Hypercalcemia      Date of Initial Endocrinology Evaluation: 01/22/2021    HPI: [name] is a 77 y.o. male with a past medical history of HTN and Dyslipidemia. The patient presented for initial endocrinology clinic visit on 01/22/2021 for consultative assistance with his Hypercalcemia .   [name] indicates that he was first diagnosed with hypercalcemia in 2014, with a serum max of 11.8 mg/dL ( noncorrected) and inappropriately normal PTH     He denies  history of kidney stones, kidney disease, liver disease, granulomatous disease.   DXA: Low bone density 01/2021 KUB: No renal stones 01/2021  24-hr urinary calcium excretion normal at 223 mg (05/2021)  S/P right superior and inferior parathyroidectomy 03/21/2022   SUBJECTIVE:    Today (05/25/22):  [name] is here for a follow up on hyperparathyroidism/Hypercalcemia. He is accompanied by his wife today.    He is a/P right superior and inferior parathyroidectomy 03/21/2022 with excellent recovery Denies polydipsia nor polyuria Denies constipation  Denies recent falls      HISTORY:  Past Medical History:  Past Medical History:  Diagnosis Date   Arthritis    Cancer ([place])    skin   COLONIC POLYPS, HX OF 09/13/2007   Qualifier: Diagnosis of  By: Steven Darner MD, Gwyndolyn Saxon   [name], GI Colonoscopy @ 5 year intervals     DUODENAL ULCER, HX OF 09/13/2007   Qualifier: Diagnosis of  By: Steven Darner MD, Gwyndolyn Saxon     GERD (gastroesophageal reflux disease)    Hyperlipidemia    Hypertension    Right bundle branch block 02/21/2022   SKIN CANCER, HX OF 09/01/2009   Qualifier: Diagnosis of  By: Steven Darner MD, [name] Cell  X 3-4; [place]  [facility]     Ulcer 1989 & 2000   bleedng X2   Past Surgical History:  Past Surgical History:  Procedure Laterality  Date   colonoscopy with polypectomy     [name]   EYE SURGERY     post trauma with glss fragments   INGUINAL HERNIA REPAIR  1970   PARATHYROIDECTOMY Right 03/21/2022   Procedure: RIGHT INFERIOR PARATHYROIDECTOMY;  Surgeon: Steven Gemma, MD;  Location: [facility];  Service: General;  Laterality: Right;   pilonidal cystectomy     TOTAL HIP ARTHROPLASTY Right 12/06/2019   Procedure: RIGHT TOTAL HIP ARTHROPLASTY ANTERIOR APPROACH;  Surgeon: Steven Rossetti, MD;  Location: [facility];  Service: Orthopedics;  Laterality: Right;    Social History:  reports that he has quit smoking. He has quit using smokeless tobacco.  His smokeless tobacco use included chew. He reports that he does not drink alcohol and does not use drugs. Family History: family history includes Diabetes in his brother; Hyperlipidemia in his brother; Hypertension in his brother; Stroke in his mother.   HOME MEDICATIONS: Allergies as of 05/25/2022       Reactions   Advil [ibuprofen] Other (See Comments)   Hx of bleeding ulcer   Choline Fenofibrate Other (See Comments)   ABDOMINAL PAIN, CONSTIPATION        Medication List        Accurate as of May 25, 2022  3:17 PM. If you have any questions, ask your nurse or doctor.  atorvastatin 40 MG tablet Commonly known as: LIPITOR TAKE 1 TABLET BY MOUTH EVERY DAY   losartan 100 MG tablet Commonly known as: COZAAR Take 1 tablet (100 mg total) by mouth daily.   metoprolol succinate 25 MG 24 hr tablet Commonly known as: TOPROL-XL Take 1 tablet (25 mg total) by mouth daily. Take with or immediately following a meal.   tadalafil 20 MG tablet Commonly known as: CIALIS TAKE 1 TABLET BY MOUTH AS NEEDED FOR ERECTILE DYSFUNCTION          REVIEW OF SYSTEMS: A comprehensive ROS was conducted with the patient and is negative except as per HPI     OBJECTIVE:  VS: BP 128/70 (BP Location: Right Arm, Patient Position: Sitting, Cuff Size: Small)   Pulse 60   Ht  6' (1.829 m)   Wt 197 lb (89.4 kg)   SpO2 96%   BMI 26.72 kg/m    Wt Readings from Last 3 Encounters:  05/25/22 197 lb (89.4 kg)  04/15/22 201 lb (91.2 kg)  03/21/22 194 lb 0.1 oz (88 kg)     EXAM: General: Pt appears well and is in NAD  Lungs: Clear with good BS bilat with no rales, rhonchi, or wheezes  Heart: Auscultation: RRR.  Extremities:  BL LE: No pretibial edema normal ROM and strength.  Mental Status: Judgment, insight: Intact Orientation: Oriented to time, place, and person Mood and affect: No depression, anxiety, or agitation     DATA REVIEWED:   Latest Reference Range & Units 05/25/22 15:00  Sodium 135 - 145 mEq/L 141  Potassium 3.5 - 5.1 mEq/L 3.4 (L)  Chloride 96 - 112 mEq/L 107  CO2 19 - 32 mEq/L 27  Glucose 70 - 99 mg/dL 93  BUN 6 - 23 mg/dL 12  Creatinine 0.40 - 1.50 mg/dL 1.09  Calcium 8.4 - 10.5 mg/dL 8.9  Albumin 3.5 - 5.2 g/dL 3.9  GFR >60.00 mL/min 66.92    Latest Reference Range & Units 05/25/22 15:00  VITD 30.00 - 100.00 ng/mL 30.66       DXA 01/25/2021   Results:   Lumbar spine L1-L4 Femoral neck (FN) 33% distal radius  T-score -0.4 RFN:  LFN: -1.6 -0.3     Assessment: the BMD is low according to the WHO classification for osteoporosis        ASSESSMENT/PLAN/RECOMMENDATIONS:   Primary Hyperparathyroidism:   -He is s/p right superior and right inferior parathyroidectomy -Postop serum calcium have normalized  Recommendations Start MVI   2.  Hypokalemia:  -Unclear cause -Patient advised to eat a banana daily   F/U prn  Signed electronically by: Steven Guise, MD  [facility]  [address] [place], [place] [address] Phone: [phone] FAX: [phone]   CC: [name], [place] [place] [address] Phone: [phone] Fax: [phone]   Return to Endocrinology clinic as below: Future Appointments  Date Time Provider  [place]  07/18/2022  2:00 PM Steven Dresser, MD [facility]  11/07/2022  2:00 PM Steven Barrack, MD [facility]

## 2022-06-27 ENCOUNTER — Telehealth: Payer: Self-pay | Admitting: Family Medicine

## 2022-06-27 NOTE — Telephone Encounter (Signed)
Copied from CRM [id number]. Topic: Medicare AWV >> Jun 27, 2022 10:52 AM Gwenith Spitz wrote: Reason for CRM: Called patient to schedule Medicare Annual Wellness Visit (AWV). Left message for patient to call back and schedule Medicare Annual Wellness Visit (AWV).  Last date of AWV: 04/08/2021  Please schedule an appointment at any time with Inetta Fermo, [facility]. Please schedule AWVS with Inetta Fermo, [facility].  If any questions, please contact me at [phone].  Thank you ,  Gabriel Cirri [facility] AWV TEAM Direct Dial [phone]

## 2022-07-18 ENCOUNTER — Encounter (HOSPITAL_BASED_OUTPATIENT_CLINIC_OR_DEPARTMENT_OTHER): Payer: Self-pay

## 2022-07-18 ENCOUNTER — Telehealth (HOSPITAL_BASED_OUTPATIENT_CLINIC_OR_DEPARTMENT_OTHER): Payer: Self-pay | Admitting: Cardiology

## 2022-07-18 ENCOUNTER — Ambulatory Visit (HOSPITAL_BASED_OUTPATIENT_CLINIC_OR_DEPARTMENT_OTHER): Payer: Medicare Other | Admitting: Cardiology

## 2022-07-18 NOTE — Telephone Encounter (Signed)
Left message for patient to call and rescheduled the 07/18/22 visit with Dr.Christopher-- provider is ill

## 2022-07-23 ENCOUNTER — Other Ambulatory Visit: Payer: Self-pay | Admitting: Family Medicine

## 2022-10-12 ENCOUNTER — Encounter (INDEPENDENT_AMBULATORY_CARE_PROVIDER_SITE_OTHER): Payer: Self-pay

## 2022-10-27 DIAGNOSIS — H6123 Impacted cerumen, bilateral: Secondary | ICD-10-CM | POA: Diagnosis not present

## 2022-11-07 ENCOUNTER — Encounter: Payer: Medicare Other | Admitting: Family Medicine

## 2022-11-08 ENCOUNTER — Ambulatory Visit (INDEPENDENT_AMBULATORY_CARE_PROVIDER_SITE_OTHER): Payer: Medicare Other | Admitting: Family Medicine

## 2022-11-08 ENCOUNTER — Encounter: Payer: Self-pay | Admitting: Family Medicine

## 2022-11-08 VITALS — BP 107/62 | HR 61 | Temp 97.8°F | Ht 72.0 in | Wt 201.0 lb

## 2022-11-08 DIAGNOSIS — R351 Nocturia: Secondary | ICD-10-CM | POA: Diagnosis not present

## 2022-11-08 DIAGNOSIS — E785 Hyperlipidemia, unspecified: Secondary | ICD-10-CM

## 2022-11-08 DIAGNOSIS — I10 Essential (primary) hypertension: Secondary | ICD-10-CM

## 2022-11-08 DIAGNOSIS — R739 Hyperglycemia, unspecified: Secondary | ICD-10-CM | POA: Diagnosis not present

## 2022-11-08 DIAGNOSIS — E210 Primary hyperparathyroidism: Secondary | ICD-10-CM

## 2022-11-08 LAB — LIPID PANEL
Cholesterol: 120 mg/dL (ref 0–200)
HDL: 30.3 mg/dL — ABNORMAL LOW (ref >39.00)
NonHDL: 89.28
Total CHOL/HDL Ratio: 4
Triglycerides: 233 mg/dL — ABNORMAL HIGH (ref 0.0–149.0)
VLDL: 46.6 mg/dL — ABNORMAL HIGH (ref 0.0–40.0)

## 2022-11-08 LAB — CBC
HCT: 43.3 % (ref 39.0–52.0)
Hemoglobin: 14.4 g/dL (ref 13.0–17.0)
MCHC: 33.1 g/dL (ref 30.0–36.0)
MCV: 89.4 fl (ref 78.0–100.0)
Platelets: 223 10*3/uL (ref 150.0–400.0)
RBC: 4.85 Mil/uL (ref 4.22–5.81)
RDW: 13.8 % (ref 11.5–15.5)
WBC: 8.8 10*3/uL (ref 4.0–10.5)

## 2022-11-08 LAB — COMPREHENSIVE METABOLIC PANEL
ALT: 11 U/L (ref 0–53)
AST: 15 U/L (ref 0–37)
Albumin: 3.6 g/dL (ref 3.5–5.2)
Alkaline Phosphatase: 67 U/L (ref 39–117)
BUN: 10 mg/dL (ref 6–23)
CO2: 27 mEq/L (ref 19–32)
Calcium: 8.9 mg/dL (ref 8.4–10.5)
Chloride: 106 mEq/L (ref 96–112)
Creatinine, Ser: 1.12 mg/dL (ref 0.40–1.50)
GFR: 64.56 mL/min (ref >60.00)
Glucose, Bld: 91 mg/dL (ref 70–99)
Potassium: 3.5 mEq/L (ref 3.5–5.1)
Sodium: 142 mEq/L (ref 135–145)
Total Bilirubin: 0.8 mg/dL (ref 0.2–1.2)
Total Protein: 6.6 g/dL (ref 6.0–8.3)

## 2022-11-08 LAB — TSH: TSH: 2.19 u[IU]/mL (ref 0.35–5.50)

## 2022-11-08 LAB — PSA: PSA: 1.11 ng/mL (ref 0.10–4.00)

## 2022-11-08 LAB — LDL CHOLESTEROL, DIRECT: Direct LDL: 82 mg/dL

## 2022-11-08 LAB — HEMOGLOBIN A1C: Hgb A1c MFr Bld: 5.8 % (ref 4.6–6.5)

## 2022-11-08 NOTE — Patient Instructions (Signed)
It was very nice to see you today!  We will check blood work today.  Please try taking fiber supplement.  Please continue to work on diet and exercise.  Return in about 1 year (around 11/08/2023) for Annual Physical.   Take care, Dr Jimmey Ralph  PLEASE NOTE:  If you had any lab tests, please let us know if you have not heard back within a few days. You may see your results on mychart before we have a chance to review them but we will give you a call once they are reviewed by [place].   If we ordered any referrals today, please let us know if you have not heard from their office within the next week.   If you had any urgent prescriptions sent in today, please check with the pharmacy within an hour of our visit to make sure the prescription was transmitted appropriately.   Please try these tips to maintain a healthy lifestyle:  Eat at least 3 REAL meals and 1-2 snacks per day.  Aim for no more than 5 hours between eating.  If you eat breakfast, please do so within one hour of getting up.   Each meal should contain half fruits/vegetables, one quarter protein, and one quarter carbs (no bigger than a computer mouse)  Cut down on sweet beverages. This includes juice, soda, and sweet tea.   Drink at least 1 glass of water with each meal and aim for at least 8 glasses per day  Exercise at least 150 minutes every week.

## 2022-11-08 NOTE — Assessment & Plan Note (Signed)
Doing well status post hyperparathyroid ectomy.  Check labs.

## 2022-11-08 NOTE — Assessment & Plan Note (Signed)
On Lipitor 40 mg daily.  Check labs.  Discussed lifestyle modifications. ?

## 2022-11-08 NOTE — Assessment & Plan Note (Signed)
Blood pressure at goal today on losartan 100 mg daily and metoprolol succinate 25 mg daily.

## 2022-11-08 NOTE — Assessment & Plan Note (Signed)
Check A1c. 

## 2022-11-08 NOTE — Progress Notes (Signed)
   [name] is a 77 y.o. male who presents today for an office visit.  Assessment/Plan:  New/Acute Problems: Diarrhea  No red flags.  Likely secondary to dietary indiscretions.  Recommended moderation with diet.  He can also try fiber supplementation.  He will let us know if symptoms worsen or if he develops any new symptoms.  Chronic Problems Addressed Today: Essential hypertension Blood pressure at goal today on losartan 100 mg daily and metoprolol succinate 25 mg daily.  Dyslipidemia On Lipitor 40 mg daily.  Check labs.  Discussed lifestyle modifications.  Hyperglycemia Check A1c.  Primary hyperparathyroidism [facility]) s/p hyperparathyroidectomy 2024 Doing well status post hyperparathyroid ectomy.  Check labs.  Preventative health care Check labs.  Due for next colonoscopy in 3 years.  Flu shot declined.    Subjective:  HPI:  See Ap for status of chronic conditions. He is here today for his annual follow up   He has noticed that he does sometimes get diarrhea when over eating while eating out.  This happens only sporadically. Symptoms are not bothersome. No nausea or vomiting. No abdominal pain.        Objective:  Physical Exam: BP 107/62   Pulse 61   Temp 97.8 F (36.6 C) (Temporal)   Ht 6' (1.829 m)   Wt 201 lb 0.3 oz (91.2 kg)   SpO2 94%   BMI 27.26 kg/m   Gen: No acute distress, resting comfortably CV: Regular rate and rhythm with no murmurs appreciated Pulm: Normal work of breathing, clear to auscultation bilaterally with no crackles, wheezes, or rhonchi Neuro: Grossly normal, moves all extremities Psych: Normal affect and thought content      Lorna Strother M. Jimmey Ralph, MD 11/08/2022 1:45 PM

## 2022-11-10 NOTE — Progress Notes (Signed)
His labs are all stable.  Blood sugar is borderline elevated but better than last year.  His LDL levels are at goal.  Rest of his labs are all stable.  He should keep up the great work with diet and exercise and we can recheck everything in a year or so.

## 2022-11-24 ENCOUNTER — Other Ambulatory Visit: Payer: Self-pay | Admitting: Family Medicine

## 2022-12-26 DIAGNOSIS — K625 Hemorrhage of anus and rectum: Secondary | ICD-10-CM | POA: Diagnosis not present

## 2022-12-26 LAB — LAB REPORT - SCANNED: EGFR: 52

## 2022-12-28 DIAGNOSIS — Z85828 Personal history of other malignant neoplasm of skin: Secondary | ICD-10-CM | POA: Diagnosis not present

## 2022-12-28 DIAGNOSIS — D1801 Hemangioma of skin and subcutaneous tissue: Secondary | ICD-10-CM | POA: Diagnosis not present

## 2022-12-28 DIAGNOSIS — L738 Other specified follicular disorders: Secondary | ICD-10-CM | POA: Diagnosis not present

## 2022-12-28 DIAGNOSIS — L821 Other seborrheic keratosis: Secondary | ICD-10-CM | POA: Diagnosis not present

## 2022-12-28 DIAGNOSIS — D225 Melanocytic nevi of trunk: Secondary | ICD-10-CM | POA: Diagnosis not present

## 2022-12-28 DIAGNOSIS — L814 Other melanin hyperpigmentation: Secondary | ICD-10-CM | POA: Diagnosis not present

## 2022-12-28 DIAGNOSIS — L918 Other hypertrophic disorders of the skin: Secondary | ICD-10-CM | POA: Diagnosis not present

## 2022-12-28 DIAGNOSIS — D2271 Melanocytic nevi of right lower limb, including hip: Secondary | ICD-10-CM | POA: Diagnosis not present

## 2022-12-31 ENCOUNTER — Other Ambulatory Visit: Payer: Self-pay | Admitting: Family Medicine

## 2022-12-31 DIAGNOSIS — Z23 Encounter for immunization: Secondary | ICD-10-CM | POA: Diagnosis not present

## 2023-01-05 ENCOUNTER — Other Ambulatory Visit: Payer: Self-pay | Admitting: Family Medicine

## 2023-01-27 ENCOUNTER — Telehealth: Payer: Self-pay | Admitting: Family Medicine

## 2023-01-27 NOTE — Telephone Encounter (Signed)
See note

## 2023-01-27 NOTE — Telephone Encounter (Signed)
Advised to go to ED, pt refused. Please advise  Patient Name First: Steven Last: [name] Gender: Male DOB: [birth year] Age: 77 Y 22 D Return Phone Number: [phone] (Primary) Address: City/ State/ Zip: [place]  [address] Client [facility] - Human resources officer [facility] [employer] Provider Jacquiline Doe- MD Contact Type Call Who Is Calling Patient / Member / Family / Caregiver Call Type Triage / Clinical Caller Name Eliana Friedel Relationship To Patient Spouse Return Phone Number [phone] (Primary) Chief Complaint Vomiting Reason for Call Symptomatic / Request for Health Information Initial Comment Caller states her husband has been sick for a week and she thought he got better. He has been vomiting again. No blood in vomit and has urinated in the last 8 hours. Translation No Nurse Assessment Nurse: Wallene Dales, RN, Shanda Bumps Date/Time Lamount Cohen Time): 01/27/2023 12:04:24 AM Confirm and document reason for call. If symptomatic, describe symptoms. ---Caller stated husband vomited several times during last week, then he started to feel a little better and now he is vomiting again. Caller reports he has vomited 3 times tonight. Caller reports her husband has had diarrhea off and on, but has not had diarrhea tonight. Caller reports her husband had run fever off and on so she checked his temp now and it is 98.2. Does the patient have any new or worsening symptoms? ---Yes Will a triage be completed? ---Yes Related visit to physician within the last 2 weeks? ---No Does the PT have any chronic conditions? (i.e. diabetes, asthma, this includes High risk factors for pregnancy, etc.) ---No Is this a behavioral health or substance abuse call? ---No Guidelines Guideline Title Affirmed Question Affirmed Notes Nurse Date/Time Lamount Cohen Time) Vomiting [1] MODERATE vomiting (e.g., 3 - 5 Mott, RN, Shanda Bumps 01/27/2023  12:11:28 AM Guidelines Guideline Title Affirmed Question Affirmed Notes Nurse Date/Time Lamount Cohen Time) times/day) AND [2] age > 60 years Disp. Time Lamount Cohen Time) Disposition Final User 01/27/2023 12:16:36 AM Go to ED Now (or PCP triage) Yes Wallene Dales, RN, Shanda Bumps Final Disposition 01/27/2023 12:16:36 AM Go to ED Now (or PCP triage) Yes Mott, RN, Rockney Ghee Disagree/Comply Disagree Caller Understands Yes PreDisposition Call Doctor Care Advice Given Per Guideline GO TO ED/UCC NOW (OR PCP TRIAGE): * IF NO PCP (PRIMARY CARE PROVIDER) SECOND-LEVEL TRIAGE: You need to be seen within the next hour. Go to the ED/UCC at _____________ Hospital. Leave as soon as you can. BRING A BUCKET IN CASE OF VOMITING: * You may wish to bring a bucket, pan, or plastic bag with you in case there is more vomiting during the drive. BRING MEDICINES: * Please bring a list of your current medicines when you go to see the doctor. CARE ADVICE per Vomiting (Adult) guideline. Comments User: Vergia Alcon, RN Date/Time Lamount Cohen Time): 01/27/2023 12:16:13 AM Caller states husband doesn't want to go tonight. Referrals GO TO FACILITY REFUSED

## 2023-03-26 ENCOUNTER — Other Ambulatory Visit (HOSPITAL_BASED_OUTPATIENT_CLINIC_OR_DEPARTMENT_OTHER): Payer: Self-pay | Admitting: Cardiology

## 2023-03-30 ENCOUNTER — Ambulatory Visit: Payer: Self-pay | Admitting: Family Medicine

## 2023-03-30 NOTE — Telephone Encounter (Signed)
Please see call note and advise/schedule on appointment with PCP

## 2023-03-30 NOTE — Telephone Encounter (Signed)
  Chief Complaint: BLE swelling Symptoms: occasional redness, swelling midcalf to feet, worse in R leg Frequency: about 5 days Pertinent Negatives: Patient denies fever, denies SOB, denies CP, denies weeping/drainage  Disposition: [] ED /[] Urgent Care (no appt availability in office) / [x] Appointment(In office/virtual)/ []  [facility]/ [] Home Care/ [] Refused Recommended Disposition /[] [facility]/ []  Follow-up with PCP  Additional Notes: Pt states this has happened in the past and his pcp told him it was d/t his BP med. Med was switched and the pt stated the swelling subsided and has not returned until now. Pt keeps active, when not active he keeps his legs up on the cough. Swelling decreases over night. Pt aware to call back if he develops fever, CP, SOB, drainage. Pt understands and agreeable. Pt prefers to see his pcp, which is scheduled for 04/04/23. Pt asked about his BP, states "its been normal". Pt advised to keep a journal of BP until next visit.   Copied from CRM [id number]. Topic: Clinical - Red Word Triage >> Mar 30, 2023  4:16 PM Elizebeth Brooking wrote: [facility] that prompted transfer to Nurse Triage: Patient called in stating his feet is swelling, he thinks it may be due to his blood pressure or the medicine for it Reason for Disposition  [1] MILD swelling of both ankles (i.e., pedal edema) AND [2] new-onset or worsening  Answer Assessment - Initial Assessment Questions 1. ONSET: "When did the swelling start?" (e.g., minutes, hours, days)     Started about 5 days ago 2. LOCATION: "What part of the leg is swollen?"  "Are both legs swollen or just one leg?"     Both legs swelling, R is worse.Best in morning, seems like when I sleep it gets better.  3. SEVERITY: "How bad is the swelling?" (e.g., localized; mild, moderate, severe)   - Localized: Small area of swelling localized to one leg.   - MILD pedal edema: Swelling limited to foot and ankle, pitting edema < 1/4 inch (6  mm) deep, rest and elevation eliminate most or all swelling.   - MODERATE edema: Swelling of lower leg to knee, pitting edema > 1/4 inch (6 mm) deep, rest and elevation only partially reduce swelling.   - SEVERE edema: Swelling extends above knee, facial or hand swelling present.      Mild to moderate 4. REDNESS: "Does the swelling look red or infected?"     Redness at times 5. PAIN: "Is the swelling painful to touch?" If Yes, ask: "How painful is it?"   (Scale 1-10; mild, moderate or severe)     denies 6. FEVER: "Do you have a fever?" If Yes, ask: "What is it, how was it measured, and when did it start?"      denies 7. CAUSE: "What do you think is causing the leg swelling?"     Unknown, states thought about 3-4 years ago his PCP said that it was d/t the BP med, switched med then was okay until now.  8. MEDICAL HISTORY: "Do you have a history of blood clots (e.g., DVT), cancer, heart failure, kidney disease, or liver failure?"     denies 9. RECURRENT SYMPTOM: "Have you had leg swelling before?" If Yes, ask: "When was the last time?" "What happened that time?"     3-4 years ago.  10. OTHER SYMPTOMS: "Do you have any other symptoms?" (e.g., chest pain, difficulty breathing)       denies  Protocols used: Leg Swelling and Edema-A-AH

## 2023-03-31 NOTE — Telephone Encounter (Signed)
Pt is already scheduled for the first available with pcp. Waited for a cancellation on 03/31/23 but no openings.

## 2023-04-04 ENCOUNTER — Ambulatory Visit: Payer: Medicare Other | Admitting: Family Medicine

## 2023-04-04 ENCOUNTER — Telehealth: Payer: Self-pay

## 2023-04-04 VITALS — BP 148/88 | HR 75 | Temp 97.2°F | Ht 72.0 in | Wt 203.3 lb

## 2023-04-04 DIAGNOSIS — R600 Localized edema: Secondary | ICD-10-CM | POA: Diagnosis not present

## 2023-04-04 DIAGNOSIS — I10 Essential (primary) hypertension: Secondary | ICD-10-CM

## 2023-04-04 LAB — CBC
HCT: 41.2 % (ref 39.0–52.0)
Hemoglobin: 13.7 g/dL (ref 13.0–17.0)
MCHC: 33.3 g/dL (ref 30.0–36.0)
MCV: 89.6 fL (ref 78.0–100.0)
Platelets: 193 10*3/uL (ref 150.0–400.0)
RBC: 4.6 Mil/uL (ref 4.22–5.81)
RDW: 15 % (ref 11.5–15.5)
WBC: 8.4 10*3/uL (ref 4.0–10.5)

## 2023-04-04 LAB — COMPREHENSIVE METABOLIC PANEL
ALT: 8 U/L (ref 0–53)
AST: 14 U/L (ref 0–37)
Albumin: 3.8 g/dL (ref 3.5–5.2)
Alkaline Phosphatase: 63 U/L (ref 39–117)
BUN: 10 mg/dL (ref 6–23)
CO2: 29 meq/L (ref 19–32)
Calcium: 8.4 mg/dL (ref 8.4–10.5)
Chloride: 105 meq/L (ref 96–112)
Creatinine, Ser: 1.22 mg/dL (ref 0.40–1.50)
GFR: 58.1 mL/min — ABNORMAL LOW (ref >60.00)
Glucose, Bld: 104 mg/dL — ABNORMAL HIGH (ref 70–99)
Potassium: 2.8 meq/L — CL (ref 3.5–5.1)
Sodium: 144 meq/L (ref 135–145)
Total Bilirubin: 1 mg/dL (ref 0.2–1.2)
Total Protein: 6.8 g/dL (ref 6.0–8.3)

## 2023-04-04 LAB — TSH: TSH: 3.09 u[IU]/mL (ref 0.35–5.50)

## 2023-04-04 MED ORDER — POTASSIUM CHLORIDE CRYS ER 20 MEQ PO TBCR: 20.0000 meq | EXTENDED_RELEASE_TABLET | Freq: Two times a day (BID) | ORAL | 0 refills | Status: DC | PRN

## 2023-04-04 NOTE — Assessment & Plan Note (Signed)
Mildly elevated today.  Typically well-controlled.  He will continue losartan 100 mg daily and metoprolol succinate 25 mg daily.  If continues to be elevated at home over the next week or so, we can add on HCTZ.

## 2023-04-04 NOTE — Addendum Note (Signed)
Addended by: Shelva Majestic on: 04/04/2023 05:18 PM   Modules accepted: Orders

## 2023-04-04 NOTE — Telephone Encounter (Signed)
I have advised patient.   Patient denies palpitations/ heart racing/ muscle weakness.   I have advised to seek care if  these symptoms occur tonight.    Patient will pick up meds to start tonight.  Will look for Dr. Rhys Martini follow up.

## 2023-04-04 NOTE — Telephone Encounter (Signed)
Potassium is low Please make sure no palpitations/heart racing/muscle weakness- if those are present seek care I sent in potassium for him to take to bring this up.  Take tonight and tomorrow morning and then defer to Dr. Jimmey Ralph for further- sent in 8 pills total

## 2023-04-04 NOTE — Telephone Encounter (Signed)
Call from [facility] lab [place]  Critical:  Low potassium of 2.8  Thank you,  Christy Gentles

## 2023-04-04 NOTE — Progress Notes (Signed)
   [name] is a 78 y.o. male who presents today for an office visit.  Assessment/Plan:  New/Acute Problems: Leg Edema  Likely secondary to venous insufficiency.  No red flag signs or symptoms on exam today.  We will check labs including CBC, c-Met, TSH, and D-dimer.  He will work on conservative measures including leg elevation, compression, and frequent ambulation.  It is reassuring that symptoms have begun to improve.  He will let us know if symptoms do not improve with above measures and would consider restarting diuretic such as Lasix or HCTZ.  Chronic Problems Addressed Today: Essential hypertension Mildly elevated today.  Typically well-controlled.  He will continue losartan 100 mg daily and metoprolol succinate 25 mg daily.  If continues to be elevated at home over the next week or so, we can add on HCTZ.    Subjective:  HPI:  See Assessment / plan for status of chronic conditions.  Patient is here with bilateral foot swelling. This started about 10 days ago.  Mostly located in the right foot though also present in the left foot.  He has been trying to keep legs elevated and work on home circulation exercises.  Symptoms did improve quite a bit for a few days however plateaued last few days.  He initially had some numbness and tingling in both feet but this is also resolved.  No chest pain.  No shortness of breath.  No orthopnea.  No recent illnesses.  He did have something similar about a year ago that resolved with stopping amlodipine.         Objective:  Physical Exam: BP (!) 148/88   Pulse 75   Temp (!) 97.2 F (36.2 C) (Temporal)   Ht 6' (1.829 m)   Wt 203 lb 4.8 oz (92.2 kg)   SpO2 96%   BMI 27.57 kg/m   Wt Readings from Last 3 Encounters:  04/04/23 203 lb 4.8 oz (92.2 kg)  11/08/22 201 lb 0.3 oz (91.2 kg)  05/25/22 197 lb (89.4 kg)    Gen: No acute distress, resting comfortably CV: Regular rate and rhythm with no murmurs appreciated Pulm: Normal work of  breathing, clear to auscultation bilaterally with no crackles, wheezes, or rhonchi MUSCULOSKELETAL: Bilateral feet with 1+ pitting edema right greater than left.  Neurovascular intact distally.  Good distal cap refill.  DP pulses 2+ and symmetric bilaterally. Neuro: Grossly normal, moves all extremities Psych: Normal affect and thought content      Sevan Mcbroom M. Jimmey Ralph, MD 04/04/2023 10:10 AM

## 2023-04-04 NOTE — Patient Instructions (Signed)
It was very nice to see you today!  Please keep your legs elevated and use compression.  Also try to cut down on salt.  We will check blood work today.  Please keep an eye on your blood pressure and let us know if it is still running high over the next few days.  Let us know if the leg swelling does not improve in the next several days.  Return if symptoms worsen or fail to improve.   Take care, Dr Jimmey Ralph  PLEASE NOTE:  If you had any lab tests, please let us know if you have not heard back within a few days. You may see your results on mychart before we have a chance to review them but we will give you a call once they are reviewed by [place].   If we ordered any referrals today, please let us know if you have not heard from their office within the next week.   If you had any urgent prescriptions sent in today, please check with the pharmacy within an hour of our visit to make sure the prescription was transmitted appropriately.   Please try these tips to maintain a healthy lifestyle:  Eat at least 3 REAL meals and 1-2 snacks per day.  Aim for no more than 5 hours between eating.  If you eat breakfast, please do so within one hour of getting up.   Each meal should contain half fruits/vegetables, one quarter protein, and one quarter carbs (no bigger than a computer mouse)  Cut down on sweet beverages. This includes juice, soda, and sweet tea.   Drink at least 1 glass of water with each meal and aim for at least 8 glasses per day  Exercise at least 150 minutes every week.

## 2023-04-05 ENCOUNTER — Other Ambulatory Visit: Payer: Self-pay | Admitting: Family Medicine

## 2023-04-05 ENCOUNTER — Other Ambulatory Visit: Payer: Self-pay | Admitting: *Deleted

## 2023-04-05 ENCOUNTER — Encounter: Payer: Self-pay | Admitting: Family Medicine

## 2023-04-05 DIAGNOSIS — E876 Hypokalemia: Secondary | ICD-10-CM

## 2023-04-05 DIAGNOSIS — R600 Localized edema: Secondary | ICD-10-CM

## 2023-04-05 LAB — D-DIMER, QUANTITATIVE: D-Dimer, Quant: 0.89 ug{FEU}/mL — ABNORMAL HIGH (ref <0.50)

## 2023-04-05 NOTE — Telephone Encounter (Signed)
See results note. 

## 2023-04-05 NOTE — Telephone Encounter (Signed)
Please see result note. Agree with him starting potassium but would like for him to come back soon here for recheck.  Katina Degree. Jimmey Ralph, MD 04/05/2023 11:12 AM

## 2023-04-05 NOTE — Progress Notes (Signed)
His potassium is low.  Dr. Durene Cal already sent in potassium supplements.  I would like for him to come back later this week or early next week to recheck.  Please place future order for Bmet.   His test for blood clot was mildly positive.  I do not think his symptoms are due to a blood clot however we should check an ultrasound to fully rule this out.  I will place the order for ultrasound.  Please make sure this is scheduled soon.  The rest of his labs are normal.

## 2023-04-07 ENCOUNTER — Other Ambulatory Visit (INDEPENDENT_AMBULATORY_CARE_PROVIDER_SITE_OTHER): Payer: Medicare Other

## 2023-04-07 ENCOUNTER — Ambulatory Visit (HOSPITAL_BASED_OUTPATIENT_CLINIC_OR_DEPARTMENT_OTHER)
Admission: RE | Admit: 2023-04-07 | Discharge: 2023-04-07 | Disposition: A | Discharge status: Home / Self Care | Payer: Medicare Other | Source: Ambulatory Visit | Attending: Family Medicine | Admitting: Family Medicine

## 2023-04-07 DIAGNOSIS — E876 Hypokalemia: Secondary | ICD-10-CM | POA: Diagnosis not present

## 2023-04-07 DIAGNOSIS — R600 Localized edema: Secondary | ICD-10-CM | POA: Diagnosis not present

## 2023-04-07 LAB — COMPREHENSIVE METABOLIC PANEL
ALT: 7 U/L (ref 0–53)
AST: 13 U/L (ref 0–37)
Albumin: 4 g/dL (ref 3.5–5.2)
Alkaline Phosphatase: 75 U/L (ref 39–117)
BUN: 13 mg/dL (ref 6–23)
CO2: 28 meq/L (ref 19–32)
Calcium: 8.6 mg/dL (ref 8.4–10.5)
Chloride: 107 meq/L (ref 96–112)
Creatinine, Ser: 1.24 mg/dL (ref 0.40–1.50)
GFR: 56.98 mL/min — ABNORMAL LOW (ref >60.00)
Glucose, Bld: 105 mg/dL — ABNORMAL HIGH (ref 70–99)
Potassium: 3.7 meq/L (ref 3.5–5.1)
Sodium: 144 meq/L (ref 135–145)
Total Bilirubin: 0.7 mg/dL (ref 0.2–1.2)
Total Protein: 6.6 g/dL (ref 6.0–8.3)

## 2023-04-11 ENCOUNTER — Encounter: Payer: Self-pay | Admitting: Family Medicine

## 2023-04-11 NOTE — Progress Notes (Signed)
Is ultrasound is negative.  He does not have a blood clot.  He should let us know if the swelling in his leg is not improving.

## 2023-04-11 NOTE — Progress Notes (Signed)
Potassium is back to normal.  He can continue with potassium supplementation for now.  We can recheck again at next office visit.

## 2023-04-12 NOTE — Telephone Encounter (Signed)
Please advise

## 2023-04-13 NOTE — Telephone Encounter (Signed)
Can we have him come in next week?  He should finish out the potassium supplement that was prescribed by Dr. Durene Cal.  We can recheck next week.  He can make sure that he is getting a diet high in potassium such as bananas but I would like to see him back next week to recheck.  We can recheck his blood pressure when he comes here.  If it is still high we we will probably need to adjust the dose of his meds.  He does not have a blood clot in his leg - see his result note regarding this.  Katina Degree. Jimmey Ralph, MD 04/13/2023 12:57 PM

## 2023-04-14 NOTE — Telephone Encounter (Signed)
Called pt to advise; scheduled pt appt for next week

## 2023-04-20 ENCOUNTER — Encounter: Payer: Self-pay | Admitting: Family Medicine

## 2023-04-20 ENCOUNTER — Ambulatory Visit (INDEPENDENT_AMBULATORY_CARE_PROVIDER_SITE_OTHER): Payer: Medicare Other | Admitting: Family Medicine

## 2023-04-20 VITALS — BP 146/84 | HR 57 | Temp 97.9°F | Ht 72.0 in | Wt 196.2 lb

## 2023-04-20 DIAGNOSIS — I10 Essential (primary) hypertension: Secondary | ICD-10-CM

## 2023-04-20 DIAGNOSIS — R600 Localized edema: Secondary | ICD-10-CM | POA: Diagnosis not present

## 2023-04-20 LAB — BASIC METABOLIC PANEL
BUN: 11 mg/dL (ref 6–23)
CO2: 29 meq/L (ref 19–32)
Calcium: 8.9 mg/dL (ref 8.4–10.5)
Chloride: 107 meq/L (ref 96–112)
Creatinine, Ser: 1.18 mg/dL (ref 0.40–1.50)
GFR: 60.46 mL/min (ref >60.00)
Glucose, Bld: 92 mg/dL (ref 70–99)
Potassium: 3.9 meq/L (ref 3.5–5.1)
Sodium: 142 meq/L (ref 135–145)

## 2023-04-20 MED ORDER — LOSARTAN POTASSIUM-HCTZ 100-12.5 MG PO TABS: 1.0000 | ORAL_TABLET | Freq: Every day | ORAL | 3 refills | Status: DC

## 2023-04-20 NOTE — Patient Instructions (Signed)
 It was very nice to see you today!  Please start the losartan /HCTZ.  This is a mild diuretic that will help with your blood pressure and swelling.  Will recheck blood work today.  Please follow-up with us  in a couple weeks.  Return in about 2 weeks (around 05/04/2023).   Take care, Dr Kennyth  PLEASE NOTE:  If you had any lab tests, please let us  know if you have not heard back within a few days. You may see your results on mychart before we have a chance to review them but we will give you a call once they are reviewed by us .   If we ordered any referrals today, please let us  know if you have not heard from their office within the next week.   If you had any urgent prescriptions sent in today, please check with the pharmacy within an hour of our visit to make sure the prescription was transmitted appropriately.   Please try these tips to maintain a healthy lifestyle:  Eat at least 3 REAL meals and 1-2 snacks per day.  Aim for no more than 5 hours between eating.  If you eat breakfast, please do so within one hour of getting up.   Each meal should contain half fruits/vegetables, one quarter protein, and one quarter carbs (no bigger than a computer mouse)  Cut down on sweet beverages. This includes juice, soda, and sweet tea.   Drink at least 1 glass of water  with each meal and aim for at least 8 glasses per day  Exercise at least 150 minutes every week.

## 2023-04-20 NOTE — Assessment & Plan Note (Signed)
 Likely due to venous insufficiency.  He has had some improvement last couple of weeks.  His ultrasound was negative.  We will add on HCTZ today to help with this as well as his blood pressure control.  He will follow-up with us  in a couple of weeks.

## 2023-04-20 NOTE — Assessment & Plan Note (Signed)
 Mildly elevated today though has had a few elevated readings at home as well.  He is currently on losartan  100 mg daily and metoprolol  succinate 25 mg daily.  Due to his concurrent lower extremity edema we will add on HCTZ 12.5 mg daily.  Check labs today.  He will follow-up with us  in a couple weeks.

## 2023-04-20 NOTE — Progress Notes (Signed)
   [name] is a 78 y.o. male who presents today for an office visit.  Assessment/Plan:  Chronic Problems Addressed Today: Leg edema Likely due to venous insufficiency.  He has had some improvement last couple of weeks.  His ultrasound was negative.  We will add on HCTZ today to help with this as well as his blood pressure control.  He will follow-up with us  in a couple of weeks.  Essential hypertension Mildly elevated today though has had a few elevated readings at home as well.  He is currently on losartan  100 mg daily and metoprolol  succinate 25 mg daily.  Due to his concurrent lower extremity edema we will add on HCTZ 12.5 mg daily.  Check labs today.  He will follow-up with us  in a couple weeks.     Subjective:  HPI:  See A/P for status of chronic conditions.  Patient is here today for follow-up.  I saw him a couple weeks ago for leg edema.  At that time we obtain labs including CBC, c-Met, TSH, and D-dimer.  His potassium was noted to be low and a potassium supplement was started.  D-dimer was also elevated.  We checked an ultrasound to further evaluate and thankfully this was negative. His swelling has improved the last couple of weeks though is still present.  Home readings of blood pressure been in the 130s to 140s though has occasional readings in the 150s to 160s.       Objective:  Physical Exam: BP (!) 142/89   Pulse (!) 57   Temp 97.9 F (36.6 C) (Temporal)   Ht 6' (1.829 m)   Wt 196 lb 3.2 oz (89 kg)   SpO2 96%   BMI 26.61 kg/m   Gen: No acute distress, resting comfortably MUSCULOSKELETAL: 1+ pitting edema to right leg.  Trace pretibial edema in the left leg. Neuro: Grossly normal, moves all extremities Psych: Normal affect and thought content      Math Brazie M. Kennyth, MD 04/20/2023 2:36 PM

## 2023-04-21 ENCOUNTER — Encounter: Payer: Self-pay | Admitting: Family Medicine

## 2023-04-21 NOTE — Progress Notes (Signed)
 His potassium is back to normal.  He does not need to take any more potassium supplements.  He should follow-up with us  in a couple of weeks as we discussed at his office visit yesterday.

## 2023-05-04 ENCOUNTER — Ambulatory Visit (INDEPENDENT_AMBULATORY_CARE_PROVIDER_SITE_OTHER): Payer: Medicare Other

## 2023-05-16 NOTE — Telephone Encounter (Signed)
 See note

## 2023-05-17 NOTE — Telephone Encounter (Signed)
 I appreciate the update. I am glad things are back to normal.  He can continue to monitor and let us know if any new issues arise.

## 2023-06-14 ENCOUNTER — Telehealth (INDEPENDENT_AMBULATORY_CARE_PROVIDER_SITE_OTHER): Payer: Self-pay | Admitting: Otolaryngology

## 2023-06-15 ENCOUNTER — Encounter (INDEPENDENT_AMBULATORY_CARE_PROVIDER_SITE_OTHER): Payer: Self-pay

## 2023-06-15 ENCOUNTER — Ambulatory Visit (INDEPENDENT_AMBULATORY_CARE_PROVIDER_SITE_OTHER): Payer: Medicare Other | Admitting: Otolaryngology

## 2023-06-15 VITALS — BP 107/71 | HR 74 | Ht 72.0 in | Wt 185.0 lb

## 2023-06-15 DIAGNOSIS — H6123 Impacted cerumen, bilateral: Secondary | ICD-10-CM | POA: Diagnosis not present

## 2023-06-16 DIAGNOSIS — H6123 Impacted cerumen, bilateral: Secondary | ICD-10-CM | POA: Insufficient documentation

## 2023-06-16 NOTE — Progress Notes (Signed)
 Patient ID: [name], male   DOB: April 23, 1945, 78 y.o.   MRN: [id number]  Follow-up: Recurrent cerumen impaction  Procedure: Bilateral cerumen disimpaction.   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   Description: The patient is placed supine on the operating table. Under the operating microscope, the right ear canal is examined and is noted to be impacted with cerumen. The cerumen is carefully removed with a combination of suction catheters, cerumen curette, and alligator forceps. After the cerumen removal, the ear canal and tympanic membrane are noted to be normal. No middle ear effusion is noted. The same procedure is then repeated on the left side without exception. The patient tolerated the procedure well.  Follow-up care:  The patient is instructed not to use Q-tips to clean the ear canals. The patient will follow up in 6 months.

## 2023-06-24 ENCOUNTER — Other Ambulatory Visit: Payer: Self-pay | Admitting: Family Medicine

## 2023-08-10 DIAGNOSIS — H2513 Age-related nuclear cataract, bilateral: Secondary | ICD-10-CM | POA: Diagnosis not present

## 2023-09-21 ENCOUNTER — Other Ambulatory Visit (HOSPITAL_BASED_OUTPATIENT_CLINIC_OR_DEPARTMENT_OTHER): Payer: Self-pay | Admitting: Cardiology

## 2023-10-19 ENCOUNTER — Other Ambulatory Visit (HOSPITAL_BASED_OUTPATIENT_CLINIC_OR_DEPARTMENT_OTHER): Payer: Self-pay | Admitting: Cardiology

## 2023-11-07 NOTE — Telephone Encounter (Signed)
 LVM to confirm appt & location [id number] afm

## 2023-11-08 ENCOUNTER — Other Ambulatory Visit: Payer: Self-pay | Admitting: Family Medicine

## 2023-11-09 ENCOUNTER — Ambulatory Visit (INDEPENDENT_AMBULATORY_CARE_PROVIDER_SITE_OTHER): Payer: Medicare Other | Admitting: Family Medicine

## 2023-11-09 ENCOUNTER — Encounter: Payer: Self-pay | Admitting: Family Medicine

## 2023-11-09 VITALS — BP 99/61 | HR 45 | Temp 97.2°F | Ht 72.0 in | Wt 188.0 lb

## 2023-11-09 DIAGNOSIS — I10 Essential (primary) hypertension: Secondary | ICD-10-CM

## 2023-11-09 DIAGNOSIS — R351 Nocturia: Secondary | ICD-10-CM

## 2023-11-09 DIAGNOSIS — E785 Hyperlipidemia, unspecified: Secondary | ICD-10-CM | POA: Diagnosis not present

## 2023-11-09 DIAGNOSIS — N529 Male erectile dysfunction, unspecified: Secondary | ICD-10-CM

## 2023-11-09 DIAGNOSIS — R739 Hyperglycemia, unspecified: Secondary | ICD-10-CM

## 2023-11-09 MED ORDER — TADALAFIL 20 MG PO TABS: 10.0000 mg | ORAL_TABLET | Freq: Every day | ORAL | 1 refills | Status: DC | PRN

## 2023-11-09 NOTE — Assessment & Plan Note (Signed)
 Blood pressure on low side today.  Has had a few lows into the 80s over 50s as well.  He does not have any current symptoms from this though would be reasonable for us  to decrease dose of his antihypertensives at this point due to his occasional low readings at home.  Will decrease his metoprolol  succinate to 12.5 mg daily.  He can continue his losartan /HCTZ 100-12.5 daily.  Will check labs today.  He will monitor at home and let us  know if he has any significant lows or highs.

## 2023-11-09 NOTE — Progress Notes (Signed)
   [name] is a 78 y.o. male who presents today for an office visit.  Assessment/Plan:   Chronic Problems Addressed Today: Essential hypertension Blood pressure on low side today.  Has had a few lows into the 80s over 50s as well.  He does not have any current symptoms from this though would be reasonable for us  to decrease dose of his antihypertensives at this point due to his occasional low readings at home.  Will decrease his metoprolol  succinate to 12.5 mg daily.  He can continue his losartan /HCTZ 100-12.5 daily.  Will check labs today.  He will monitor at home and let us  know if he has any significant lows or highs.  Dyslipidemia Check lipids.  He is on Lipitor 40 mg daily and doing well.  Hyperglycemia Check A1c with labs.  Erectile dysfunction Stable on Cialis  20 mg daily as needed.  Will refill today.  Check PSA.  Preventative health care Check labs.  Up-to-date on vaccines.  Will get flu vaccine in October.  Due for colonoscopy next year.    Subjective:  HPI:  See assessment / plan for status of chronic conditions.   Discussed the use of AI scribe software for clinical note transcription with the patient, who gave verbal consent to proceed.  History of Present Illness [name] is a 78 year old male who presents for a Medicare check-up.  He contracted COVID-19 around Father's Day, which resulted in significant illness for a few days and a weight loss of about ten pounds. He has since recovered and regained the weight.  Earlier in the year, he experienced foot swelling, which was managed by adding a diuretic to his medication regimen. Since then, he has had no further swelling.  He regularly monitors his blood pressure using two programs, [employer] and [employer], and reports consistently good readings. He is currently taking metoprolol  and losartan  for blood pressure management. No dizziness or feeling faint.  He takes atorvastatin  for cholesterol management  and has recently refilled this medication.  He acknowledges not getting as much exercise as he should due to being busy caring for his wife, who has mobility issues. He tries to exercise at home, including stretching and using a treadmill occasionally.  He admits to not consuming enough fruits and vegetables in his diet.  He has a history of polyps found during a previous colonoscopy and plans to follow up with his gastroenterologist after the first of the year.  He recently had his vision checked and obtained new glasses, with no significant changes noted in his vision.         Objective:  Physical Exam: BP 99/61   Pulse (!) 45   Temp (!) 97.2 F (36.2 C) (Temporal)   Ht 6' (1.829 m)   Wt 188 lb (85.3 kg)   SpO2 95%   BMI 25.50 kg/m   Wt Readings from Last 3 Encounters:  11/09/23 188 lb (85.3 kg)  06/15/23 185 lb (83.9 kg)  04/20/23 196 lb 3.2 oz (89 kg)    Gen: No acute distress, resting comfortably CV: Regular rate and rhythm with no murmurs appreciated Pulm: Normal work of breathing, clear to auscultation bilaterally with no crackles, wheezes, or rhonchi Neuro: Grossly normal, moves all extremities Psych: Normal affect and thought content      Cosby Proby M. Kennyth, MD 11/09/2023 2:34 PM

## 2023-11-09 NOTE — Assessment & Plan Note (Signed)
 Check lipids.  He is on Lipitor 40 mg daily and doing well.

## 2023-11-09 NOTE — Assessment & Plan Note (Signed)
 Stable on Cialis  20 mg daily as needed.  Will refill today.  Check PSA.

## 2023-11-09 NOTE — Patient Instructions (Signed)
 It was very nice to see you today!  VISIT SUMMARY: Today, you had a routine Medicare check-up. We discussed your recent recovery from COVID-19, your well-controlled blood pressure, and your current medications. We also talked about your exercise and diet habits, and planned for a follow-up colonoscopy next year.  YOUR PLAN: ADULT WELLNESS VISIT: Routine wellness visit to check overall health. -Your recent COVID-19 infection has resolved. -Your blood pressure is well-controlled but slightly low. -We discussed the importance of regular exercise and a balanced diet. -A colonoscopy is recommended next year due to previous polyps. -We ordered comprehensive blood work including cholesterol, A1c, blood counts, thyroid , kidney, and liver function tests.   ESSENTIAL HYPERTENSION: High blood pressure that is well-controlled with medication. -Reduce metoprolol  dosage by cutting the pill in half to prevent low blood pressure. -Continue taking losartan  HCTZ at the current dosage. -Monitor your blood pressure regularly and report any significant changes.  HYPERLIPIDEMIA: High cholesterol managed with medication. -Continue taking atorvastatin  as prescribed. -We will monitor your lipid levels with the upcoming blood work.  ERECTILE DYSFUNCTION: Difficulty in maintaining an erection, managed with medication. -Refill your Cialis  prescription.  Return in about 1 year (around 11/08/2024) for Follow Up.   Take care, Dr Kennyth  PLEASE NOTE:  If you had any lab tests, please let us  know if you have not heard back within a few days. You may see your results on mychart before we have a chance to review them but we will give you a call once they are reviewed by us .   If we ordered any referrals today, please let us  know if you have not heard from their office within the next week.   If you had any urgent prescriptions sent in today, please check with the pharmacy within an hour of our visit to make sure  the prescription was transmitted appropriately.   Please try these tips to maintain a healthy lifestyle:  Eat at least 3 REAL meals and 1-2 snacks per day.  Aim for no more than 5 hours between eating.  If you eat breakfast, please do so within one hour of getting up.   Each meal should contain half fruits/vegetables, one quarter protein, and one quarter carbs (no bigger than a computer mouse)  Cut down on sweet beverages. This includes juice, soda, and sweet tea.   Drink at least 1 glass of water  with each meal and aim for at least 8 glasses per day  Exercise at least 150 minutes every week.    Preventive Care 70 Years and Older, Male Preventive care refers to lifestyle choices and visits with your health care provider that can promote health and wellness. Preventive care visits are also called wellness exams. What can I expect for my preventive care visit? Counseling During your preventive care visit, your health care provider may ask about your: Medical history, including: Past medical problems. Family medical history. History of falls. Current health, including: Emotional well-being. Home life and relationship well-being. Sexual activity. Memory and ability to understand (cognition). Lifestyle, including: Alcohol, nicotine or tobacco, and drug use. Access to firearms. Diet, exercise, and sleep habits. Work and work Astronomer. Sunscreen use. Safety issues such as seatbelt and bike helmet use. Physical exam Your health care provider will check your: Height and weight. These may be used to calculate your BMI (body mass index). BMI is a measurement that tells if you are at a healthy weight. Waist circumference. This measures the distance around your waistline. This measurement also  tells if you are at a healthy weight and may help predict your risk of certain diseases, such as type 2 diabetes and high blood pressure. Heart rate and blood pressure. Body temperature. Skin for  abnormal spots. What immunizations do I need?  Vaccines are usually given at various ages, according to a schedule. Your health care provider will recommend vaccines for you based on your age, medical history, and lifestyle or other factors, such as travel or where you work. What tests do I need? Screening Your health care provider may recommend screening tests for certain conditions. This may include: Lipid and cholesterol levels. Diabetes screening. This is done by checking your blood sugar (glucose) after you have not eaten for a while (fasting). Hepatitis C test. Hepatitis B test. HIV (human immunodeficiency virus) test. STI (sexually transmitted infection) testing, if you are at risk. Lung cancer screening. Colorectal cancer screening. Prostate cancer screening. Abdominal aortic aneurysm (AAA) screening. You may need this if you are a current or former smoker. Talk with your health care provider about your test results, treatment options, and if necessary, the need for more tests. Follow these instructions at home: Eating and drinking  Eat a diet that includes fresh fruits and vegetables, whole grains, lean protein, and low-fat dairy products. Limit your intake of foods with high amounts of sugar, saturated fats, and salt. Take vitamin and mineral supplements as recommended by your health care provider. Do not drink alcohol if your health care provider tells you not to drink. If you drink alcohol: Limit how much you have to 0-2 drinks a day. Know how much alcohol is in your drink. In the [place] one drink equals one 12 oz bottle of beer (355 mL), one 5 oz glass of wine (148 mL), or one 1 oz glass of hard liquor (44 mL). Lifestyle Brush your teeth every morning and night with fluoride toothpaste. Floss one time each day. Exercise for at least 30 minutes 5 or more days each week. Do not use any products that contain nicotine or tobacco. These products include cigarettes, chewing  tobacco, and vaping devices, such as e-cigarettes. If you need help quitting, ask your health care provider. Do not use drugs. If you are sexually active, practice safe sex. Use a condom or other form of protection to prevent STIs. Take aspirin  only as told by your health care provider. Make sure that you understand how much to take and what form to take. Work with your health care provider to find out whether it is safe and beneficial for you to take aspirin  daily. Ask your health care provider if you need to take a cholesterol-lowering medicine (statin). Find healthy ways to manage stress, such as: Meditation, yoga, or listening to music. Journaling. Talking to a trusted person. Spending time with friends and family. Safety Always wear your seat belt while driving or riding in a vehicle. Do not drive: If you have been drinking alcohol. Do not ride with someone who has been drinking. When you are tired or distracted. While texting. If you have been using any mind-altering substances or drugs. Wear a helmet and other protective equipment during sports activities. If you have firearms in your house, make sure you follow all gun safety procedures. Minimize exposure to UV radiation to reduce your risk of skin cancer. What's next? Visit your health care provider once a year for an annual wellness visit. Ask your health care provider how often you should have your eyes and teeth checked. Stay up  to date on all vaccines. This information is not intended to replace advice given to you by your health care provider. Make sure you discuss any questions you have with your health care provider. Document Revised: 08/26/2020 Document Reviewed: 08/26/2020 Elsevier Patient Education  2024 [employer].

## 2023-11-09 NOTE — Assessment & Plan Note (Signed)
Check A1c with labs. 

## 2023-11-10 ENCOUNTER — Ambulatory Visit: Payer: Self-pay | Admitting: Family Medicine

## 2023-11-10 DIAGNOSIS — R944 Abnormal results of kidney function studies: Secondary | ICD-10-CM

## 2023-11-10 LAB — CBC
HCT: 41.8 % (ref 39.0–52.0)
Hemoglobin: 13.8 g/dL (ref 13.0–17.0)
MCHC: 33.1 g/dL (ref 30.0–36.0)
MCV: 90.1 fl (ref 78.0–100.0)
Platelets: 212 K/uL (ref 150.0–400.0)
RBC: 4.64 Mil/uL (ref 4.22–5.81)
RDW: 13.7 % (ref 11.5–15.5)
WBC: 8 K/uL (ref 4.0–10.5)

## 2023-11-10 LAB — TSH: TSH: 2.16 u[IU]/mL (ref 0.35–5.50)

## 2023-11-10 LAB — LIPID PANEL
Cholesterol: 136 mg/dL (ref 0–200)
HDL: 29.2 mg/dL — ABNORMAL LOW (ref >39.00)
LDL Cholesterol: 74 mg/dL (ref 0–99)
NonHDL: 107.27
Total CHOL/HDL Ratio: 5
Triglycerides: 165 mg/dL — ABNORMAL HIGH (ref 0.0–149.0)
VLDL: 33 mg/dL (ref 0.0–40.0)

## 2023-11-10 LAB — COMPREHENSIVE METABOLIC PANEL WITH GFR
ALT: 9 U/L (ref 0–53)
AST: 12 U/L (ref 0–37)
Albumin: 4 g/dL (ref 3.5–5.2)
Alkaline Phosphatase: 75 U/L (ref 39–117)
BUN: 24 mg/dL — ABNORMAL HIGH (ref 6–23)
CO2: 26 meq/L (ref 19–32)
Calcium: 8.6 mg/dL (ref 8.4–10.5)
Chloride: 106 meq/L (ref 96–112)
Creatinine, Ser: 1.68 mg/dL — ABNORMAL HIGH (ref 0.40–1.50)
GFR: 39.41 mL/min — ABNORMAL LOW (ref >60.00)
Glucose, Bld: 88 mg/dL (ref 70–99)
Potassium: 4.2 meq/L (ref 3.5–5.1)
Sodium: 142 meq/L (ref 135–145)
Total Bilirubin: 0.7 mg/dL (ref 0.2–1.2)
Total Protein: 6.6 g/dL (ref 6.0–8.3)

## 2023-11-10 LAB — PSA: PSA: 0.95 ng/mL (ref 0.10–4.00)

## 2023-11-10 LAB — HEMOGLOBIN A1C: Hgb A1c MFr Bld: 5.9 % (ref 4.6–6.5)

## 2023-11-10 NOTE — Progress Notes (Signed)
 His kidney numbers are significantly increased compared to last time.  It looks like he may be a little dehydrated.  Recommend that he push fluids and come back to recheck here in a week.  Please place future order for be met.  The rest of the labs are all stable.  We can recheck everything else in a year.

## 2023-11-21 ENCOUNTER — Other Ambulatory Visit (INDEPENDENT_AMBULATORY_CARE_PROVIDER_SITE_OTHER)

## 2023-11-21 DIAGNOSIS — R944 Abnormal results of kidney function studies: Secondary | ICD-10-CM | POA: Diagnosis not present

## 2023-11-23 ENCOUNTER — Ambulatory Visit: Payer: Self-pay | Admitting: Family Medicine

## 2023-11-23 ENCOUNTER — Other Ambulatory Visit: Payer: Self-pay | Admitting: *Deleted

## 2023-11-23 DIAGNOSIS — R944 Abnormal results of kidney function studies: Secondary | ICD-10-CM

## 2023-11-23 LAB — BASIC METABOLIC PANEL WITH GFR
BUN: 21 mg/dL (ref 6–23)
CO2: 25 meq/L (ref 19–32)
Calcium: 9.1 mg/dL (ref 8.4–10.5)
Chloride: 102 meq/L (ref 96–112)
Creatinine, Ser: 1.6 mg/dL — ABNORMAL HIGH (ref 0.40–1.50)
GFR: 41.78 mL/min — ABNORMAL LOW (ref >60.00)
Glucose, Bld: 112 mg/dL — ABNORMAL HIGH (ref 70–99)
Potassium: 4.1 meq/L (ref 3.5–5.1)
Sodium: 138 meq/L (ref 135–145)

## 2023-11-23 NOTE — Progress Notes (Signed)
 His kidney numbers are still elevated.  This may be due to his blood pressure medication.  Recommend that he stop his losartan  hydrochlorothiazide and come back here next week for recheck.

## 2023-12-04 ENCOUNTER — Other Ambulatory Visit

## 2023-12-04 ENCOUNTER — Other Ambulatory Visit (INDEPENDENT_AMBULATORY_CARE_PROVIDER_SITE_OTHER)

## 2023-12-04 DIAGNOSIS — R944 Abnormal results of kidney function studies: Secondary | ICD-10-CM

## 2023-12-04 LAB — BASIC METABOLIC PANEL WITH GFR
BUN: 12 mg/dL (ref 6–23)
CO2: 27 meq/L (ref 19–32)
Calcium: 8.8 mg/dL (ref 8.4–10.5)
Chloride: 107 meq/L (ref 96–112)
Creatinine, Ser: 1.37 mg/dL (ref 0.40–1.50)
GFR: 50.32 mL/min — ABNORMAL LOW (ref >60.00)
Glucose, Bld: 114 mg/dL — ABNORMAL HIGH (ref 70–99)
Potassium: 3.6 meq/L (ref 3.5–5.1)
Sodium: 141 meq/L (ref 135–145)

## 2023-12-05 ENCOUNTER — Ambulatory Visit: Payer: Self-pay | Admitting: Family Medicine

## 2023-12-05 NOTE — Progress Notes (Signed)
 His kidney numbers are improving.  I would like to see him back in the office if possible so we can recheck his blood pressure since stopping his blood pressure meds.  Please make sure he has a follow-up appointment scheduled in the next week or 2.

## 2023-12-21 ENCOUNTER — Ambulatory Visit (INDEPENDENT_AMBULATORY_CARE_PROVIDER_SITE_OTHER): Admitting: Otolaryngology

## 2023-12-21 ENCOUNTER — Encounter (INDEPENDENT_AMBULATORY_CARE_PROVIDER_SITE_OTHER): Payer: Self-pay | Admitting: Otolaryngology

## 2023-12-21 VITALS — BP 147/78 | HR 63 | Temp 97.8°F

## 2023-12-21 DIAGNOSIS — H6123 Impacted cerumen, bilateral: Secondary | ICD-10-CM | POA: Diagnosis not present

## 2023-12-21 NOTE — Progress Notes (Signed)
 Patient ID: [name], male   DOB: April 23, 1945, 78 y.o.   MRN: [id number]  Follow-up: Recurrent cerumen impaction  Procedure: Bilateral cerumen disimpaction.   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   Description: The patient is placed supine on the operating table. Under the operating microscope, the right ear canal is examined and is noted to be impacted with cerumen. The cerumen is carefully removed with a combination of suction catheters, cerumen curette, and alligator forceps. After the cerumen removal, the ear canal and tympanic membrane are noted to be normal. No middle ear effusion is noted. The same procedure is then repeated on the left side without exception. The patient tolerated the procedure well.  Follow-up care:  The patient is instructed not to use Q-tips to clean the ear canals. The patient will follow up in 6 months.

## 2023-12-26 ENCOUNTER — Other Ambulatory Visit (HOSPITAL_BASED_OUTPATIENT_CLINIC_OR_DEPARTMENT_OTHER): Payer: Self-pay | Admitting: Cardiology

## 2023-12-26 ENCOUNTER — Ambulatory Visit (INDEPENDENT_AMBULATORY_CARE_PROVIDER_SITE_OTHER): Admitting: Family Medicine

## 2023-12-26 ENCOUNTER — Encounter: Payer: Self-pay | Admitting: Family Medicine

## 2023-12-26 VITALS — BP 130/74 | HR 45 | Temp 97.4°F | Ht 72.0 in | Wt 194.6 lb

## 2023-12-26 DIAGNOSIS — I10 Essential (primary) hypertension: Secondary | ICD-10-CM

## 2023-12-26 DIAGNOSIS — Z23 Encounter for immunization: Secondary | ICD-10-CM

## 2023-12-26 MED ORDER — LOSARTAN POTASSIUM 25 MG PO TABS: 25.0000 mg | ORAL_TABLET | Freq: Every day | ORAL | 3 refills | Status: DC

## 2023-12-26 NOTE — Progress Notes (Signed)
   [name] is a 78 y.o. male who presents today for an office visit.  Assessment/Plan:   Chronic Problems Addressed Today: Essential hypertension Patient's initial blood pressure today 156/80 however his home readings are mostly in the 120s to 130s over 70s to 80s.  This is higher than his previous readings of 80s over 60s however still mostly at goal.  He is no longer taking his HCTZ losartan  due to his recent elevated creatinine levels which were likely secondary to his dehydration and low blood pressure.  His renal function improved after discontinuing these meds.  He is currently only on metoprolol  succinate 12.5 mg daily.  He would like to have better control of his blood pressure readings though we did discuss with patient that most of his readings are at goal.  Given his initial elevation today would be reasonable to restart a low-dose losartan  25 mg daily.  Patient was agreeable with this plan.  He will continue his metoprolol  succinate 12.5 mg daily and follow-up with us  in a few weeks.  Flu shot given today.     Subjective:  HPI:  See assessment / plan for status of chronic conditions.   Discussed the use of AI scribe software for clinical note transcription with the patient, who gave verbal consent to proceed.  History of Present Illness [name] is a 78 year old male with hypertension who presents with elevated blood pressure readings.  His blood pressure has been elevated since his last visit. Previously, he was on metoprolol  and losartan  hydrochlorothiazide, but his medications were adjusted in August 2025 after having low readings in the office in the 80s over 50s. Since then, he has been taking half a tablet of metoprolol  daily.  Labs at that time showed elevated creatinine which were confirmed on repeat.  We told him to stop his losartan -HCTZ and his creatinine levels were trending back to normal on recheck after stopping the medication.  Blood pressure  readings from September 13 to December 25, 2023, have been mostly higher than normal, with some readings in the 130s and one reading above the target range. P  He experienced elevated creatinine levels in August 2025. After rehydrating and adjusting his medications, his creatinine levels returned to normal. He has been drinking more water , approximately 40 ounces a day, to maintain hydration.  Currently, he is on a half dose of metoprolol , which he has been taking since November 09, 2023, and is not taking losartan  hydrochlorothiazide anymore.         Objective:  Physical Exam: BP 130/74   Pulse (!) 45   Temp (!) 97.4 F (36.3 C) (Temporal)   Ht 6' (1.829 m)   Wt 194 lb 9.6 oz (88.3 kg)   SpO2 94%   BMI 26.39 kg/m   Gen: No acute distress, resting comfortably CV: Regular rate and rhythm with no murmurs appreciated Pulm: Normal work of breathing, clear to auscultation bilaterally with no crackles, wheezes, or rhonchi Neuro: Grossly normal, moves all extremities Psych: Normal affect and thought content      Katilyn Miltenberger M. Kennyth, MD 12/26/2023 2:20 PM

## 2023-12-26 NOTE — Assessment & Plan Note (Addendum)
 Patient's initial blood pressure today 156/80 however his home readings are mostly in the 120s to 130s over 70s to 80s.  This is higher than his previous readings of 80s over 60s however still mostly at goal.  He is no longer taking his HCTZ losartan  due to his recent elevated creatinine levels which were likely secondary to his dehydration and low blood pressure.  His renal function improved after discontinuing these meds.  He is currently only on metoprolol  succinate 12.5 mg daily.  He would like to have better control of his blood pressure readings though we did discuss with patient that most of his readings are at goal.  Given his initial elevation today would be reasonable to restart a low-dose losartan  25 mg daily.  Patient was agreeable with this plan.  He will continue his metoprolol  succinate 12.5 mg daily and follow-up with us  in a few weeks.

## 2023-12-26 NOTE — Patient Instructions (Addendum)
 It was very nice to see you today!  VISIT SUMMARY: Today, we discussed your elevated blood pressure readings and adjusted your medication to better manage your hypertension, especially considering your chronic kidney disease.  YOUR PLAN: HYPERTENSION :  -Continue taking metoprolol  at 12.5 mg daily. -Start taking losartan  at 25 mg daily. -Monitor your blood pressure regularly and send a MyChart message in a few weeks with your updates. -Avoid combining losartan  with hydrochlorothiazide.  Return if symptoms worsen or fail to improve.   Take care, Dr Kennyth  PLEASE NOTE:  If you had any lab tests, please let us  know if you have not heard back within a few days. You may see your results on mychart before we have a chance to review them but we will give you a call once they are reviewed by us .   If we ordered any referrals today, please let us  know if you have not heard from their office within the next week.   If you had any urgent prescriptions sent in today, please check with the pharmacy within an hour of our visit to make sure the prescription was transmitted appropriately.   Please try these tips to maintain a healthy lifestyle:  Eat at least 3 REAL meals and 1-2 snacks per day.  Aim for no more than 5 hours between eating.  If you eat breakfast, please do so within one hour of getting up.   Each meal should contain half fruits/vegetables, one quarter protein, and one quarter carbs (no bigger than a computer mouse)  Cut down on sweet beverages. This includes juice, soda, and sweet tea.   Drink at least 1 glass of water  with each meal and aim for at least 8 glasses per day  Exercise at least 150 minutes every week.

## 2024-01-09 ENCOUNTER — Other Ambulatory Visit (HOSPITAL_BASED_OUTPATIENT_CLINIC_OR_DEPARTMENT_OTHER): Payer: Self-pay | Admitting: Cardiology

## 2024-01-30 DIAGNOSIS — D2239 Melanocytic nevi of other parts of face: Secondary | ICD-10-CM | POA: Diagnosis not present

## 2024-01-30 DIAGNOSIS — L218 Other seborrheic dermatitis: Secondary | ICD-10-CM | POA: Diagnosis not present

## 2024-01-30 DIAGNOSIS — D2271 Melanocytic nevi of right lower limb, including hip: Secondary | ICD-10-CM | POA: Diagnosis not present

## 2024-01-30 DIAGNOSIS — L821 Other seborrheic keratosis: Secondary | ICD-10-CM | POA: Diagnosis not present

## 2024-01-30 DIAGNOSIS — L738 Other specified follicular disorders: Secondary | ICD-10-CM | POA: Diagnosis not present

## 2024-01-30 DIAGNOSIS — L814 Other melanin hyperpigmentation: Secondary | ICD-10-CM | POA: Diagnosis not present

## 2024-01-30 DIAGNOSIS — Z85828 Personal history of other malignant neoplasm of skin: Secondary | ICD-10-CM | POA: Diagnosis not present

## 2024-01-30 DIAGNOSIS — D1801 Hemangioma of skin and subcutaneous tissue: Secondary | ICD-10-CM | POA: Diagnosis not present

## 2024-01-30 DIAGNOSIS — D2262 Melanocytic nevi of left upper limb, including shoulder: Secondary | ICD-10-CM | POA: Diagnosis not present

## 2024-02-01 ENCOUNTER — Encounter: Payer: Self-pay | Admitting: Family Medicine

## 2024-02-02 MED ORDER — METOPROLOL SUCCINATE ER 25 MG PO TB24: 25.0000 mg | ORAL_TABLET | Freq: Every day | ORAL | 3 refills | Status: DC

## 2024-02-02 MED ORDER — LOSARTAN POTASSIUM 50 MG PO TABS: 50.0000 mg | ORAL_TABLET | Freq: Every day | ORAL | 3 refills | Status: DC

## 2024-03-01 ENCOUNTER — Inpatient Hospital Stay (HOSPITAL_BASED_OUTPATIENT_CLINIC_OR_DEPARTMENT_OTHER): Admission: EM | Admit: 2024-03-01 | Discharge: 2024-03-16 | Disposition: A | Source: Home / Self Care

## 2024-03-01 ENCOUNTER — Emergency Department (HOSPITAL_BASED_OUTPATIENT_CLINIC_OR_DEPARTMENT_OTHER)

## 2024-03-01 ENCOUNTER — Other Ambulatory Visit: Payer: Self-pay

## 2024-03-01 ENCOUNTER — Encounter (HOSPITAL_BASED_OUTPATIENT_CLINIC_OR_DEPARTMENT_OTHER): Payer: Self-pay

## 2024-03-01 DIAGNOSIS — R34 Anuria and oliguria: Secondary | ICD-10-CM | POA: Diagnosis not present

## 2024-03-01 DIAGNOSIS — R6520 Severe sepsis without septic shock: Secondary | ICD-10-CM | POA: Diagnosis present

## 2024-03-01 DIAGNOSIS — E785 Hyperlipidemia, unspecified: Secondary | ICD-10-CM | POA: Diagnosis present

## 2024-03-01 DIAGNOSIS — Z85828 Personal history of other malignant neoplasm of skin: Secondary | ICD-10-CM

## 2024-03-01 DIAGNOSIS — D62 Acute posthemorrhagic anemia: Secondary | ICD-10-CM | POA: Diagnosis not present

## 2024-03-01 DIAGNOSIS — Z83438 Family history of other disorder of lipoprotein metabolism and other lipidemia: Secondary | ICD-10-CM

## 2024-03-01 DIAGNOSIS — K8010 Calculus of gallbladder with chronic cholecystitis without obstruction: Secondary | ICD-10-CM | POA: Diagnosis present

## 2024-03-01 DIAGNOSIS — E861 Hypovolemia: Secondary | ICD-10-CM | POA: Diagnosis present

## 2024-03-01 DIAGNOSIS — I472 Ventricular tachycardia, unspecified: Secondary | ICD-10-CM | POA: Diagnosis not present

## 2024-03-01 DIAGNOSIS — E860 Dehydration: Secondary | ICD-10-CM | POA: Diagnosis present

## 2024-03-01 DIAGNOSIS — N401 Enlarged prostate with lower urinary tract symptoms: Secondary | ICD-10-CM | POA: Diagnosis present

## 2024-03-01 DIAGNOSIS — Z96641 Presence of right artificial hip joint: Secondary | ICD-10-CM | POA: Diagnosis present

## 2024-03-01 DIAGNOSIS — R0602 Shortness of breath: Secondary | ICD-10-CM | POA: Diagnosis not present

## 2024-03-01 DIAGNOSIS — E872 Acidosis, unspecified: Secondary | ICD-10-CM | POA: Diagnosis present

## 2024-03-01 DIAGNOSIS — Z9189 Other specified personal risk factors, not elsewhere classified: Secondary | ICD-10-CM | POA: Diagnosis not present

## 2024-03-01 DIAGNOSIS — Z992 Dependence on renal dialysis: Secondary | ICD-10-CM | POA: Diagnosis not present

## 2024-03-01 DIAGNOSIS — N170 Acute kidney failure with tubular necrosis: Secondary | ICD-10-CM | POA: Diagnosis present

## 2024-03-01 DIAGNOSIS — I959 Hypotension, unspecified: Secondary | ICD-10-CM

## 2024-03-01 DIAGNOSIS — N179 Acute kidney failure, unspecified: Secondary | ICD-10-CM | POA: Diagnosis not present

## 2024-03-01 DIAGNOSIS — Z87891 Personal history of nicotine dependence: Secondary | ICD-10-CM

## 2024-03-01 DIAGNOSIS — B9620 Unspecified Escherichia coli [E. coli] as the cause of diseases classified elsewhere: Secondary | ICD-10-CM | POA: Diagnosis present

## 2024-03-01 DIAGNOSIS — D696 Thrombocytopenia, unspecified: Secondary | ICD-10-CM

## 2024-03-01 DIAGNOSIS — Z79899 Other long term (current) drug therapy: Secondary | ICD-10-CM | POA: Diagnosis not present

## 2024-03-01 DIAGNOSIS — K219 Gastro-esophageal reflux disease without esophagitis: Secondary | ICD-10-CM | POA: Diagnosis present

## 2024-03-01 DIAGNOSIS — N1832 Chronic kidney disease, stage 3b: Secondary | ICD-10-CM | POA: Diagnosis present

## 2024-03-01 DIAGNOSIS — D3501 Benign neoplasm of right adrenal gland: Secondary | ICD-10-CM | POA: Diagnosis present

## 2024-03-01 DIAGNOSIS — E876 Hypokalemia: Secondary | ICD-10-CM

## 2024-03-01 DIAGNOSIS — K661 Hemoperitoneum: Secondary | ICD-10-CM | POA: Diagnosis not present

## 2024-03-01 DIAGNOSIS — R338 Other retention of urine: Secondary | ICD-10-CM | POA: Diagnosis present

## 2024-03-01 DIAGNOSIS — K529 Noninfective gastroenteritis and colitis, unspecified: Secondary | ICD-10-CM | POA: Diagnosis present

## 2024-03-01 DIAGNOSIS — K8001 Calculus of gallbladder with acute cholecystitis with obstruction: Secondary | ICD-10-CM | POA: Diagnosis present

## 2024-03-01 DIAGNOSIS — Z833 Family history of diabetes mellitus: Secondary | ICD-10-CM

## 2024-03-01 DIAGNOSIS — I5031 Acute diastolic (congestive) heart failure: Secondary | ICD-10-CM | POA: Diagnosis not present

## 2024-03-01 DIAGNOSIS — I130 Hypertensive heart and chronic kidney disease with heart failure and stage 1 through stage 4 chronic kidney disease, or unspecified chronic kidney disease: Secondary | ICD-10-CM | POA: Diagnosis present

## 2024-03-01 DIAGNOSIS — I493 Ventricular premature depolarization: Secondary | ICD-10-CM | POA: Diagnosis not present

## 2024-03-01 DIAGNOSIS — I4729 Other ventricular tachycardia: Secondary | ICD-10-CM

## 2024-03-01 DIAGNOSIS — K810 Acute cholecystitis: Principal | ICD-10-CM | POA: Diagnosis present

## 2024-03-01 DIAGNOSIS — J9601 Acute respiratory failure with hypoxia: Secondary | ICD-10-CM | POA: Diagnosis present

## 2024-03-01 DIAGNOSIS — A419 Sepsis, unspecified organism: Secondary | ICD-10-CM | POA: Diagnosis present

## 2024-03-01 DIAGNOSIS — E210 Primary hyperparathyroidism: Secondary | ICD-10-CM | POA: Diagnosis present

## 2024-03-01 DIAGNOSIS — Z1152 Encounter for screening for COVID-19: Secondary | ICD-10-CM | POA: Diagnosis not present

## 2024-03-01 DIAGNOSIS — I4510 Unspecified right bundle-branch block: Secondary | ICD-10-CM | POA: Diagnosis present

## 2024-03-01 DIAGNOSIS — Z823 Family history of stroke: Secondary | ICD-10-CM

## 2024-03-01 DIAGNOSIS — I10 Essential (primary) hypertension: Secondary | ICD-10-CM | POA: Diagnosis present

## 2024-03-01 DIAGNOSIS — R54 Age-related physical debility: Secondary | ICD-10-CM | POA: Diagnosis present

## 2024-03-01 DIAGNOSIS — Z8711 Personal history of peptic ulcer disease: Secondary | ICD-10-CM

## 2024-03-01 DIAGNOSIS — N2889 Other specified disorders of kidney and ureter: Secondary | ICD-10-CM | POA: Diagnosis present

## 2024-03-01 DIAGNOSIS — Z8249 Family history of ischemic heart disease and other diseases of the circulatory system: Secondary | ICD-10-CM

## 2024-03-01 DIAGNOSIS — E538 Deficiency of other specified B group vitamins: Secondary | ICD-10-CM | POA: Diagnosis present

## 2024-03-01 LAB — LIPASE, BLOOD: Lipase: 97 U/L — ABNORMAL HIGH (ref 11–51)

## 2024-03-01 LAB — COMPREHENSIVE METABOLIC PANEL WITH GFR
ALT: 40 U/L (ref 0–44)
AST: 57 U/L — ABNORMAL HIGH (ref 15–41)
Albumin: 3.2 g/dL — ABNORMAL LOW (ref 3.5–5.0)
Alkaline Phosphatase: 148 U/L — ABNORMAL HIGH (ref 38–126)
Anion gap: 15 (ref 5–15)
BUN: 70 mg/dL — ABNORMAL HIGH (ref 8–23)
CO2: 25 mmol/L (ref 22–32)
Calcium: 8.7 mg/dL — ABNORMAL LOW (ref 8.9–10.3)
Chloride: 95 mmol/L — ABNORMAL LOW (ref 98–111)
Creatinine, Ser: 6 mg/dL — ABNORMAL HIGH (ref 0.61–1.24)
GFR, Estimated: 9 mL/min — ABNORMAL LOW
Glucose, Bld: 105 mg/dL — ABNORMAL HIGH (ref 70–99)
Potassium: 2.9 mmol/L — ABNORMAL LOW (ref 3.5–5.1)
Sodium: 135 mmol/L (ref 135–145)
Total Bilirubin: 2.5 mg/dL — ABNORMAL HIGH (ref 0.0–1.2)
Total Protein: 6.4 g/dL — ABNORMAL LOW (ref 6.5–8.1)

## 2024-03-01 LAB — CBC
HCT: 41.6 % (ref 39.0–52.0)
HCT: 43.2 % (ref 39.0–52.0)
Hemoglobin: 14.8 g/dL (ref 13.0–17.0)
Hemoglobin: 14.8 g/dL (ref 13.0–17.0)
MCH: 29.8 pg (ref 26.0–34.0)
MCH: 29.4 pg (ref 26.0–34.0)
MCHC: 35.6 g/dL (ref 30.0–36.0)
MCHC: 34.3 g/dL (ref 30.0–36.0)
MCV: 83.9 fL (ref 80.0–100.0)
MCV: 85.9 fL (ref 80.0–100.0)
Platelets: 83 K/uL — ABNORMAL LOW (ref 150–400)
Platelets: 86 K/uL — ABNORMAL LOW (ref 150–400)
RBC: 4.96 MIL/uL (ref 4.22–5.81)
RBC: 5.03 MIL/uL (ref 4.22–5.81)
RDW: 14 % (ref 11.5–15.5)
RDW: 14.3 % (ref 11.5–15.5)
WBC: 18.3 K/uL — ABNORMAL HIGH (ref 4.0–10.5)
WBC: 19.8 K/uL — ABNORMAL HIGH (ref 4.0–10.5)
nRBC: 0 % (ref 0.0–0.2)
nRBC: 0 % (ref 0.0–0.2)

## 2024-03-01 LAB — URINALYSIS, ROUTINE W REFLEX MICROSCOPIC
Glucose, UA: 100 mg/dL — AB
Ketones, ur: NEGATIVE mg/dL
Nitrite: POSITIVE — AB
Protein, ur: >300 mg/dL — AB
Specific Gravity, Urine: >1.03 — ABNORMAL HIGH (ref 1.005–1.030)
pH: 5 (ref 5.0–8.0)

## 2024-03-01 LAB — URINALYSIS, MICROSCOPIC (REFLEX)

## 2024-03-01 LAB — RESP PANEL BY RT-PCR (RSV, FLU A&B, COVID)  RVPGX2
Influenza A by PCR: NEGATIVE
Influenza B by PCR: NEGATIVE
Resp Syncytial Virus by PCR: NEGATIVE
SARS Coronavirus 2 by RT PCR: NEGATIVE

## 2024-03-01 LAB — CREATININE, SERUM
Creatinine, Ser: 5.87 mg/dL — ABNORMAL HIGH (ref 0.61–1.24)
GFR, Estimated: 9 mL/min — ABNORMAL LOW

## 2024-03-01 LAB — LACTIC ACID, PLASMA
Lactic Acid, Venous: 1.5 mmol/L (ref 0.5–1.9)
Lactic Acid, Venous: 1.3 mmol/L (ref 0.5–1.9)

## 2024-03-01 LAB — MAGNESIUM: Magnesium: 2.5 mg/dL — ABNORMAL HIGH (ref 1.7–2.4)

## 2024-03-01 MED ORDER — SODIUM CHLORIDE 0.9 % IV SOLN: INTRAVENOUS | Status: DC

## 2024-03-01 MED ORDER — POTASSIUM CHLORIDE 10 MEQ/100ML IV SOLN
10.0000 meq | INTRAVENOUS | Status: AC
  Administered 2024-03-01 (×2): 10 meq via INTRAVENOUS
  Filled 2024-03-01: qty 100

## 2024-03-01 MED ORDER — ACETAMINOPHEN 650 MG RE SUPP: 650.0000 mg | Freq: Four times a day (QID) | RECTAL | Status: DC | PRN

## 2024-03-01 MED ORDER — ONDANSETRON HCL 4 MG/2ML IJ SOLN
4.0000 mg | Freq: Four times a day (QID) | INTRAMUSCULAR | Status: DC | PRN
  Administered 2024-03-03 – 2024-03-10 (×10): 4 mg via INTRAVENOUS
  Filled 2024-03-01 (×10): qty 2

## 2024-03-01 MED ORDER — PIPERACILLIN-TAZOBACTAM 3.375 G IVPB
3.3750 g | Freq: Two times a day (BID) | INTRAVENOUS | Status: AC
  Administered 2024-03-02 – 2024-03-08 (×14): 3.375 g via INTRAVENOUS
  Filled 2024-03-01 (×16): qty 50

## 2024-03-01 MED ORDER — SODIUM CHLORIDE 0.9 % IV BOLUS
1000.0000 mL | Freq: Once | INTRAVENOUS | Status: AC
  Administered 2024-03-01: 1000 mL via INTRAVENOUS

## 2024-03-01 MED ORDER — ACETAMINOPHEN 325 MG PO TABS
650.0000 mg | ORAL_TABLET | Freq: Four times a day (QID) | ORAL | Status: DC | PRN
  Administered 2024-03-06: 650 mg via ORAL
  Filled 2024-03-01: qty 2

## 2024-03-01 MED ORDER — PIPERACILLIN-TAZOBACTAM 3.375 G IVPB
3.3750 g | Freq: Once | INTRAVENOUS | Status: AC
  Administered 2024-03-01: 3.375 g via INTRAVENOUS
  Filled 2024-03-01: qty 50

## 2024-03-01 MED ORDER — POTASSIUM CHLORIDE CRYS ER 20 MEQ PO TBCR
40.0000 meq | EXTENDED_RELEASE_TABLET | Freq: Once | ORAL | Status: AC
  Administered 2024-03-01: 40 meq via ORAL
  Filled 2024-03-01: qty 2

## 2024-03-01 MED ORDER — HEPARIN SODIUM (PORCINE) 5000 UNIT/ML IJ SOLN
5000.0000 [IU] | Freq: Three times a day (TID) | INTRAMUSCULAR | Status: DC
  Administered 2024-03-01 – 2024-03-08 (×17): 5000 [IU] via SUBCUTANEOUS
  Filled 2024-03-01 (×17): qty 1

## 2024-03-01 MED ORDER — ONDANSETRON HCL 4 MG/2ML IJ SOLN: 4.0000 mg | Freq: Three times a day (TID) | INTRAMUSCULAR | Status: DC | PRN

## 2024-03-01 MED ORDER — ALBUTEROL SULFATE (2.5 MG/3ML) 0.083% IN NEBU: 2.5000 mg | INHALATION_SOLUTION | RESPIRATORY_TRACT | Status: DC | PRN

## 2024-03-01 NOTE — ED Triage Notes (Signed)
 Pt arrives with concerns that he may have the flu. States that he did have some vomiting on Sunday and Monday. States that he is also having some abdominal pain. States that is also coughing. States that he is also not peeing a lot.

## 2024-03-01 NOTE — Progress Notes (Signed)
" °   03/01/24 2109  Vitals  Temp 98.5 F (36.9 C)  Temp Source Oral  BP (!) 135/90  MAP (mmHg) 103  BP Location Right Arm  BP Method Automatic  Patient Position (if appropriate) Lying  Pulse Rate 87  ECG Heart Rate 95  Resp 19  Level of Consciousness  Level of Consciousness Alert  MEWS COLOR  MEWS Score Color Green  Oxygen Therapy  SpO2 95 %  O2 Device Room Air  Height and Weight  Height 6' 1 (1.854 m)  Weight 86.7 kg  Type of Scale Used Standing  Type of Weight Actual  BSA (Calculated - sq m) 2.11 sq meters  BMI (Calculated) 25.22  Weight in (lb) to have BMI = 25 189.1  MEWS Score  MEWS Temp 0  MEWS Systolic 0  MEWS Pulse 0  MEWS RR 0  MEWS LOC 0  MEWS Score 0   Admitted pt to rm 3E19 from [facility], pt alert and oriented X 4, denies pain at this time, oriented to room, call bell placed within reach, placed on cardiac monitor, CCMD made aware. "

## 2024-03-01 NOTE — ED Provider Notes (Signed)
 " [facility] Provider Note   CSN: [id number] Arrival date & time: 03/01/24  1457     Patient presents with: multiple complaints   [name] is a 78 y.o. male.   Patient with history of hypertension, high cholesterol --presents to the emergency department for evaluation of weakness in the setting of recent nausea, vomiting, and diarrhea.  Patient states that he started feeling poorly on the afternoon of 12/14.  He states that he had shaking chills and then about a half an hour later had multiple episodes of vomiting.  Later that day and into the next day he also developed diarrhea.  He has some generalized abdominal tenderness and pain.  Symptoms not really improve, although no longer vomiting but with nausea and decreased appetite.  Reports bubbly urine during this time without dysuria.  He felt very tired and weak today, prompting emergency department visit.  He has had an intermittent cough over the past few weeks.  No antibiotics.  No documented fever.  No known sick contacts.  Passing primarily watery stool, no black or bloody stools.       Prior to Admission medications  Medication Sig Start Date End Date Taking? Authorizing Provider  atorvastatin  (LIPITOR) 40 MG tablet TAKE 1 TABLET BY MOUTH EVERY DAY 11/08/23   Kennyth Worth HERO, MD  losartan  (COZAAR ) 50 MG tablet Take 1 tablet (50 mg total) by mouth daily. 02/02/24   Kennyth Worth HERO, MD  metoprolol  succinate (TOPROL -XL) 25 MG 24 hr tablet Take 1 tablet (25 mg total) by mouth daily. 02/02/24   Kennyth Worth HERO, MD  tadalafil  (CIALIS ) 20 MG tablet Take 0.5-1 tablets (10-20 mg total) by mouth daily as needed for erectile dysfunction. 11/09/23   Kennyth Worth HERO, MD    Allergies: Advil [ibuprofen] and Choline fenofibrate    Review of Systems  Updated Vital Signs BP (!) 168/115 (BP Location: Right Arm)   Pulse 67   Temp 98.1 F (36.7 C)   Resp 18   Ht 6' (1.829 m)   Wt 88.3  kg   SpO2 93%   BMI 26.40 kg/m   Physical Exam Vitals and nursing note reviewed.  Constitutional:      General: He is not in acute distress.    Appearance: He is well-developed.  HENT:     Head: Normocephalic and atraumatic.     Right Ear: External ear normal.     Left Ear: External ear normal.     Nose: Nose normal.     Mouth/Throat:     Mouth: Mucous membranes are moist.  Eyes:     General:        Right eye: No discharge.        Left eye: No discharge.     Conjunctiva/sclera: Conjunctivae normal.  Cardiovascular:     Rate and Rhythm: Normal rate and regular rhythm.     Heart sounds: Normal heart sounds.  Pulmonary:     Effort: Pulmonary effort is normal.     Breath sounds: Normal breath sounds.  Abdominal:     Palpations: Abdomen is soft.     Tenderness: There is abdominal tenderness. There is no guarding or rebound.     Comments: Right-sided abdominal tenderness to palpation  Musculoskeletal:     Cervical back: Normal range of motion and neck supple.  Skin:    General: Skin is warm and dry.  Neurological:     Mental Status: He is alert.     (  all labs ordered are listed, but only abnormal results are displayed) Labs Reviewed  LIPASE, BLOOD - Abnormal; Notable for the following components:      Result Value   Lipase 97 (*)    All other components within normal limits  COMPREHENSIVE METABOLIC PANEL WITH GFR - Abnormal; Notable for the following components:   Potassium 2.9 (*)    Chloride 95 (*)    Glucose, Bld 105 (*)    BUN 70 (*)    Creatinine, Ser 6.00 (*)    Calcium  8.7 (*)    Total Protein 6.4 (*)    Albumin 3.2 (*)    AST 57 (*)    Alkaline Phosphatase 148 (*)    Total Bilirubin 2.5 (*)    GFR, Estimated 9 (*)    All other components within normal limits  CBC - Abnormal; Notable for the following components:   WBC 18.3 (*)    Platelets 83 (*)    All other components within normal limits  MAGNESIUM - Abnormal; Notable for the following components:    Magnesium 2.5 (*)    All other components within normal limits  RESP PANEL BY RT-PCR (RSV, FLU A&B, COVID)  RVPGX2  CULTURE, BLOOD (ROUTINE X 2)  CULTURE, BLOOD (ROUTINE X 2)  URINALYSIS, ROUTINE W REFLEX MICROSCOPIC  LACTIC ACID, PLASMA  LACTIC ACID, PLASMA    EKG: EKG Interpretation Date/Time:  Friday March 01 2024 16:05:37 EST Ventricular Rate:  110 PR Interval:  150 QRS Duration:  158 QT Interval:  341 QTC Calculation: 358 R Axis:   -61  Text Interpretation: Sinus tachycardia Ventricular tachycardia, unsustained Probable left atrial enlargement Right bundle branch block Anteroseptal infarct, age indeterminate Confirmed by Steven Charleston [phone]) on 03/01/2024 5:00:15 PM  Radiology: CT ABDOMEN PELVIS WO CONTRAST Result Date: 03/01/2024 CLINICAL DATA:  Abdominal pain, acute, nonlocalized R abd pain, renal failure. EXAM: CT ABDOMEN AND PELVIS WITHOUT CONTRAST TECHNIQUE: Multidetector CT imaging of the abdomen and pelvis was performed following the standard protocol without IV contrast. RADIATION DOSE REDUCTION: This exam was performed according to the departmental dose-optimization program which includes automated exposure control, adjustment of the mA and/or kV according to patient size and/or use of iterative reconstruction technique. COMPARISON:  None Available. FINDINGS: Lower chest: There is ground-glass opacity in the right lower lobe, concerning for pneumonitis. There also associated linear areas of atelectasis/scarring in the right lower lobe. No pleural effusion. Mildly enlarged heart size. No pericardial effusion. There are coronary artery atherosclerotic calcifications, in keeping with coronary artery disease. Hepatobiliary: The liver is normal in size. Non-cirrhotic configuration. No suspicious mass. No intrahepatic or extrahepatic bile duct dilation. No calcified choledocholithiasis. The gallbladder is distended measuring up to 5.4 cm in width and exhibit  mild-to-moderate circumferential wall thickening and moderate pericholecystic fat stranding. There are several subcentimeter sized faintly calcified gallstones. There are few prominent pericholecystic lymph nodes. Findings are compatible with acute cholecystitis. Pancreas: Unremarkable. No pancreatic ductal dilatation or surrounding inflammatory changes. Spleen: Within normal limits. No focal lesion. Adrenals/Urinary Tract: Unremarkable left adrenal gland. 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 Asymmetrically moderately atrophic right kidney noted. There is right extrarenal pelvis. There is mild fullness in the right renal collecting system. No right nephroureterolithiasis. There is a partially exophytic simple cyst arising from the left kidney interpolar region, posteriorly measuring 4.8 x 6.4 cm. There is a partially exophytic 2.5 x 2.7 cm soft tissue mass arising from the left kidney upper pole, which cannot be characterized  as a simple cyst on this exam. There is indeterminate. Further evaluation with multiphasic contrast-enhanced MRI abdomen as per renal mass protocol is recommended. No left nephroureterolithiasis or obstructive uropathy. Stomach/Bowel: There is a small sliding hiatal hernia. There is a diverticulum arising from the second part of duodenum. No disproportionate dilation of the small or large bowel loops. Unremarkable appendix. 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 There is mild asymmetric thickening of the hepatic flexure of colon which is likely secondary to inflammation from acute cholecystitis. Vascular/Lymphatic: No ascites or pneumoperitoneum. No abdominal or pelvic lymphadenopathy, by size criteria. No aneurysmal dilation of the major abdominal arteries. There are moderate peripheral atherosclerotic vascular calcifications of the aorta and its major branches. Reproductive: Normal size prostate. Symmetric seminal vesicles. Other: There is a small fat  containing umbilical hernia and bilateral small fat containing inguinal hernias. The soft tissues and abdominal wall are otherwise unremarkable. Musculoskeletal: No suspicious osseous lesions. There are mild - moderate multilevel degenerative changes in the visualized spine. IMPRESSION: 1. Findings compatible with acute cholecystitis. No calcified choledocholithiasis. 2. There is a 2.5 x 2.7 cm soft tissue mass arising from the left kidney upper pole, which cannot be characterized as a simple cyst on this exam. Further evaluation with multiphasic contrast-enhanced MRI abdomen as per renal mass protocol is recommended. 3. 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 4. Ground-glass opacity in the right lower lobe, concerning for pneumonitis. Correlate clinically. 5. Multiple other nonacute observations, as described above. Aortic Atherosclerosis (ICD10-I70.0). Electronically Signed   By: Ree Molt M.D.   On: 03/01/2024 17:22     Procedures   Medications Ordered in the ED  potassium chloride  10 mEq in 100 mL IVPB (10 mEq Intravenous New Bag/Given 03/01/24 1730)  piperacillin -tazobactam (ZOSYN ) IVPB 3.375 g (3.375 g Intravenous New Bag/Given 03/01/24 1749)  0.9 %  sodium chloride  infusion ( Intravenous New Bag/Given 03/01/24 1750)  sodium chloride  0.9 % bolus 1,000 mL (0 mLs Intravenous Stopped 03/01/24 1750)    ED Course  Patient seen and examined. History obtained directly from patient.   Labs/EKG: Ordered CBC, CMP, lipase, UA.  I added EKG due to generalized weakness in a patient with cardiac risk factors.  Flu swab was negative.  Imaging: None ordered.  Will consider CT imaging with any concerning lab findings.  Medications/Fluids: Ordered: Fluid bolus  Most recent vital signs reviewed and are as follows: BP (!) 168/115 (BP Location: Right Arm)   Pulse 67   Temp 98.1 F (36.7 C)   Resp 18   Ht 6' (1.829 m)   Wt 88.3 kg   SpO2 93%   BMI 26.40 kg/m   Initial impression:  Generalized weakness, recent GI illness with nausea, vomiting, diarrhea.  5:01 PM Reassessment performed. Patient appears comfortable, receiving IV fluids.  Labs personally reviewed and interpreted including: CBC with elevated white blood cell count, normal hemoglobin, platelets low at 83; CMP with potassium low at 2.9, chloride 95, glucose 105, creatinine markedly elevated from baseline at 6.0 with a BUN of 70, total bili is also mildly elevated with alk phos elevated at 148; lipase elevated at 97.  Added magnesium.  Viral panel negative.  Imaging personally visualized and interpreted including: Initially was going to obtain CT with contrast, changed to without contrast due to AKI.  Reviewed pertinent lab work and imaging with patient at bedside. Questions answered.  Updated patient and family on need for admission to the hospital.  Most current vital signs reviewed  and are as follows: BP 96/61   Pulse 67   Temp 98.1 F (36.7 C)   Resp (!) 28   Ht 6' (1.829 m)   Wt 88.3 kg   SpO2 93%   BMI 26.40 kg/m   Plan: Currently receiving IV fluids, a lot of PVCs noted on EKG.  Discussed with Dr. Yolande.   6:04 PM Reassessment performed. Patient appears stable.   Labs personally reviewed and interpreted including: Magnesium 2.5  Imaging personally visualized and interpreted including: CT abdomen and pelvis without contrast, acute cholecystitis suspected.  Left kidney upper pole mass noted, will likely need outpatient follow-up.  Ground glass opacity in the right lower lobe, question of aspiration with recent vomiting, patient on Zosyn .  Reviewed pertinent lab work and imaging with patient at bedside. Questions answered.   Most current vital signs reviewed and are as follows: BP 109/60   Pulse (!) 38   Temp 98.1 F (36.7 C)   Resp (!) 21   Ht 6' (1.829 m)   Wt 88.3 kg   SpO2 95%   BMI 26.40 kg/m   Plan: Admit to hospital.   Patient has been started on Zosyn , continuing IV fluids.   Runs of potassium ordered for hypokalemia.  Sepsis workup sent --including cultures and lactate.   I discussed case with Dr. Stevie of general surgery, they will consult.  I have discussed case with Dr. Raenelle with [facility] hospitalist who will admit.  CRITICAL CARE Performed by: Fonda Ruby PA-C Total critical care time: 45 minutes Critical care time was exclusive of separately billable procedures and treating other patients. Critical care was necessary to treat or prevent imminent or life-threatening deterioration. Critical care was time spent personally by me on the following activities: development of treatment plan with patient and/or surrogate as well as nursing, discussions with consultants, evaluation of patient's response to treatment, examination of patient, obtaining history from patient or surrogate, ordering and performing treatments and interventions, ordering and review of laboratory studies, ordering and review of radiographic studies, pulse oximetry and re-evaluation of patient's condition.                                  Medical Decision Making Amount and/or Complexity of Data Reviewed Labs: ordered. Radiology: ordered.  Risk Prescription drug management. Decision regarding hospitalization.   For this patient's complaint of abdominal pain, the following conditions were considered on the differential diagnosis: gastritis/PUD, enteritis/duodenitis, appendicitis, cholelithiasis/cholecystitis, cholangitis, pancreatitis, ruptured viscus, colitis, diverticulitis, small/large bowel obstruction, proctitis, cystitis, pyelonephritis, ureteral colic, aortic dissection, aortic aneurysm. Atypical chest etiologies were also considered including ACS, PE, and pneumonia.  Patient found to have AKI in setting of recent vomiting and diarrhea.  He still has urination, but small-volume today.  Hopefully this will respond with fluid repletion.       Final diagnoses:  Acute  cholecystitis  Acute renal failure, unspecified acute renal failure type  Hypokalemia    ED Discharge Orders     None          Ruby Fonda, PA-C 03/01/24 1808    Steven Lamar BROCKS, MD 03/09/24 [phone]  "

## 2024-03-01 NOTE — H&P (Addendum)
 " History and Physical    Patient: [name] FMW:[id number] DOB: 1945-08-04 DOA: 03/01/2024 DOS: the patient was seen and examined on 03/01/2024 PCP: Kennyth Worth HERO, MD  Patient coming from: [facility]  Referring Provider: Fonda Ruby Telemedicine Providerj: Donalda Applebaum Provider Location:  [facility] Referring Diagnosis: AKI, Cholecystitis Patient Name and DOB verified: [name], [birth year] Patient consented to Telemedicine Evaluation:Yes RN virtual assistant: Aleck Molt Video encounter time and date:03/01/24-at 627 pm  Additional Information:  Transfusion: obtained consent-ok to transfuse (has had transfusion in the past) CPAP/BIPAP: No O2:No Foley: No   Chief Complaint:  Chief Complaint  Patient presents with   multiple complaints   HPI: [name] is a 78 y.o. male with medical history significant of HTN, HLD who presented to the ED with nausea, vomiting and diarrhea.  Per patient-he started feeling poorly on the afternoon of 12/14-when he came back from church and had a frozen [employer] chicken meal.  An hour later he started having chills/subjective fever with rigors.  This was followed by profuse vomiting episodes-and then later with numerous loose stools.  He describes the vomitus initially being the chicken that he ate but later on being watery without any blood.  Stools are described as loose and watery without any mucus or blood.  He continued to have numerous episodes of vomiting and diarrhea for the next several days.  His last episode of loose stools was this afternoon, his last episode of vomiting was yesterday.  He also has had significant loss of appetite since this past Sunday.  He also acknowledges some abdominal discomfort-this is generalized-and located to any particular part of his abdomen.  Patient also acknowledges having very low urine output-difficulty urinating for the past 2 days, he has only  urinated once this afternoon.  He describes his urine being dark orange in color-without any obvious bleeding. Since he continued to feel poorly-he presented to the ED where he was found to have AKI, hypokalemia and CT abdomen evidence of acute cholecystitis.  The hospitalist service was then asked to admit this patient   + Subjective fever No headache No chest pain or shortness of breath + Abdominal pain-generalized + Nausea, vomiting diarrhea No hematuria, hematochezia, hematemesis   Review of Systems: As mentioned in the history of present illness. All other systems reviewed and are negative. Past Medical History:  Diagnosis Date   Arthritis    Cancer (HCC)    skin   COLONIC POLYPS, HX OF 09/13/2007   Qualifier: Diagnosis of  By: Tish MD, Elsie   Dr Oliva Boots, GI Colonoscopy @ 5 year intervals     DUODENAL ULCER, HX OF 09/13/2007   Qualifier: Diagnosis of  By: Tish MD, Elsie     GERD (gastroesophageal reflux disease)    Hyperlipidemia    Hypertension    Right bundle branch block 02/21/2022   SKIN CANCER, HX OF 09/01/2009   Qualifier: Diagnosis of  By: Tish MD, Elsie [facility]  X 3-4; [place]  [facility]     Ulcer 1989 & 2000   bleedng X2   Past Surgical History:  Procedure Laterality Date   colonoscopy with polypectomy     Dr Boots   EYE SURGERY     post trauma with glss fragments   INGUINAL HERNIA REPAIR  1970   PARATHYROIDECTOMY Right 03/21/2022   Procedure: RIGHT INFERIOR PARATHYROIDECTOMY;  Surgeon: Eletha Boas, MD;  Location: [facility];  Service: General;  Laterality:  Right;   pilonidal cystectomy     TOTAL HIP ARTHROPLASTY Right 12/06/2019   Procedure: RIGHT TOTAL HIP ARTHROPLASTY ANTERIOR APPROACH;  Surgeon: Vernetta Lonni GRADE, MD;  Location: [facility];  Service: Orthopedics;  Laterality: Right;   Social History:  reports that he has quit smoking. He has quit using smokeless tobacco.  His smokeless tobacco use included chew. He reports that he does not  drink alcohol and does not use drugs.  Allergies[1]  Family History  Problem Relation Age of Onset   Stroke Mother        late 64s   Diabetes Brother        Type II   Hypertension Brother    Hyperlipidemia Brother    Heart disease Neg Hx    Cancer Neg Hx     Prior to Admission medications  Medication Sig Start Date End Date Taking? Authorizing Provider  atorvastatin  (LIPITOR) 40 MG tablet TAKE 1 TABLET BY MOUTH EVERY DAY 11/08/23   Kennyth Worth HERO, MD  losartan  (COZAAR ) 50 MG tablet Take 1 tablet (50 mg total) by mouth daily. 02/02/24   Kennyth Worth HERO, MD  metoprolol  succinate (TOPROL -XL) 25 MG 24 hr tablet Take 1 tablet (25 mg total) by mouth daily. 02/02/24   Kennyth Worth HERO, MD  tadalafil  (CIALIS ) 20 MG tablet Take 0.5-1 tablets (10-20 mg total) by mouth daily as needed for erectile dysfunction. 11/09/23   Kennyth Worth HERO, MD    Physical Exam: (Completed with assistance of: Aleck Molt, RN, who was at bedside during this portion of the virtual encounter) Vitals:   03/01/24 1645 03/01/24 1700 03/01/24 1715 03/01/24 1900  BP: (!) 101/54 106/67 109/60 (!) 101/56  Pulse: (!) 40 (!) 42 (!) 38 (!) 38  Resp: (!) 26 (!) 27 (!) 21 (!) 25  Temp:      SpO2: 94% 99% 95% 96%  Weight:      Height:       Gen Exam:Alert awake-not in any distress HEENT:atraumatic, normocephalic Chest: B/L clear to auscultation anteriorly CVS:S1S2 regular Abdomen:soft non tender, appears to have mild diffuse tenderness-does have some RUQ tenderness on deep palpation by RN. Extremities:no edema Neurology: Non focal-easily moving all 4 extremities.  Speech clear and fluent. Skin: no rash  Data Reviewed:    Latest Ref Rng & Units 03/01/2024    3:07 PM 11/09/2023    2:36 PM 04/04/2023   10:18 AM  CBC  WBC 4.0 - 10.5 K/uL 18.3  8.0  8.4   Hemoglobin 13.0 - 17.0 g/dL 85.1  86.1  86.2   Hematocrit 39.0 - 52.0 % 41.6  41.8  41.2   Platelets 150 - 400 K/uL 83  212.0  193.0        Latest Ref Rng &  Units 03/01/2024    3:07 PM 12/04/2023    1:57 PM 11/21/2023    1:58 PM  BMP  Glucose 70 - 99 mg/dL 894  885  887   BUN 8 - 23 mg/dL 70  12  21   Creatinine 0.61 - 1.24 mg/dL 3.99  8.62  8.39   Sodium 135 - 145 mmol/L 135  141  138   Potassium 3.5 - 5.1 mmol/L 2.9  3.6  4.1   Chloride 98 - 111 mmol/L 95  107  102   CO2 22 - 32 mmol/L 25  27  25    Calcium  8.9 - 10.3 mg/dL 8.7  8.8  9.1      Assessment and Plan:  AKI Suspect this is hemodynamically mediated-in the setting of nausea vomiting diarrhea, losartan  use-possible cholecystitis Check UA Frequent bladder scans-however CT imaging did not show any hydronephrosis Hydrate with IVF-follow renal function closely  Addendum Per ED RN-bladder scan showed only 11 cc of urine.  Sepsis (POA) either secondary to gastroenteritis or cholecystitis Empirically covered with antibiotics Blood cultures obtained Stool studies ordered-see below  Nausea vomiting diarrhea ?  Viral gastroenteritis-but occurred after he ate a [employer] frozen meal-felt this could be food poisoning Check stool studies IV fluids-antiemetics-already being empirically covered with Zosyn due to concern for acute cholecystitis  Acute cholecystitis His main issue seems to be gastroenteritis-abdominal pain is not a prominent feature, however he does have RUQ tenderness on deep palpation by RN exam.  He also has CT evidence of acute cholecystitis. For now we will n.p.o. IV fluids-IV antibiotics as above Surgical consultation when patient arrives at [facility] provider has already spoken with general surgery (Dr. Rosanne)  Hypokalemia Due to GI loss Being repleted in the ED-with 3 rounds of IV KCl-Will add 1 dose of oral KCl Recheck in AM. Magnesium levels are stable.  Thrombocytopenia Mild No evidence of bleeding Perhaps related to viral infection/gastroenteritis Watch closely.  HTN BP soft but stable-patient appears asymptomatic Hold all  antihypertensive  PVCs Previously evaluated by cardiology-echo January last year with stable EF-he has undergone outpatient monitor for 7 days See above regarding potassium supplementation Magnesium level stable Beta-blocker on hold due to soft blood pressure-resume when able Monitor on telemetry  HLD Hold statin-due to mild transaminitis   Advance Care Planning:   Code Status: Full Code   Consults: CCS, Renal  Family Communication: None at bedside  Severity of Illness: The appropriate patient status for this patient is INPATIENT. Inpatient status is judged to be reasonable and necessary in order to provide the required intensity of service to ensure the patient's safety. The patient's presenting symptoms, physical exam findings, and initial radiographic and laboratory data in the context of their chronic comorbidities is felt to place them at high risk for further clinical deterioration. Furthermore, it is not anticipated that the patient will be medically stable for discharge from the hospital within 2 midnights of admission.   * I certify that at the point of admission it is my clinical judgment that the patient will require inpatient hospital care spanning beyond 2 midnights from the point of admission due to high intensity of service, high risk for further deterioration and high frequency of surveillance required.*  Author: Donalda Applebaum, MD 03/01/2024 7:11 PM  For on call review [email or web address].      [1]  Allergies Allergen Reactions   Advil [Ibuprofen] Other (See Comments)    Hx of bleeding ulcer   Choline Fenofibrate Other (See Comments)    ABDOMINAL PAIN, CONSTIPATION   "

## 2024-03-02 ENCOUNTER — Encounter (HOSPITAL_COMMUNITY): Payer: Self-pay | Admitting: Internal Medicine

## 2024-03-02 DIAGNOSIS — N179 Acute kidney failure, unspecified: Secondary | ICD-10-CM | POA: Diagnosis not present

## 2024-03-02 LAB — GASTROINTESTINAL PANEL BY PCR, STOOL (REPLACES STOOL CULTURE)

## 2024-03-02 LAB — C DIFFICILE QUICK SCREEN W PCR REFLEX
C Diff antigen: NEGATIVE
C Diff interpretation: NOT DETECTED
C Diff toxin: NEGATIVE

## 2024-03-02 LAB — CBC
HCT: 41.6 % (ref 39.0–52.0)
Hemoglobin: 14.4 g/dL (ref 13.0–17.0)
MCH: 29.4 pg (ref 26.0–34.0)
MCHC: 34.6 g/dL (ref 30.0–36.0)
MCV: 85.1 fL (ref 80.0–100.0)
Platelets: 81 K/uL — ABNORMAL LOW (ref 150–400)
RBC: 4.89 MIL/uL (ref 4.22–5.81)
RDW: 14.3 % (ref 11.5–15.5)
WBC: 19.3 K/uL — ABNORMAL HIGH (ref 4.0–10.5)
nRBC: 0 % (ref 0.0–0.2)

## 2024-03-02 LAB — COMPREHENSIVE METABOLIC PANEL WITH GFR
ALT: 39 U/L (ref 0–44)
AST: 61 U/L — ABNORMAL HIGH (ref 15–41)
Albumin: 2.6 g/dL — ABNORMAL LOW (ref 3.5–5.0)
Alkaline Phosphatase: 142 U/L — ABNORMAL HIGH (ref 38–126)
Anion gap: 17 — ABNORMAL HIGH (ref 5–15)
BUN: 74 mg/dL — ABNORMAL HIGH (ref 8–23)
CO2: 16 mmol/L — ABNORMAL LOW (ref 22–32)
Calcium: 8 mg/dL — ABNORMAL LOW (ref 8.9–10.3)
Chloride: 102 mmol/L (ref 98–111)
Creatinine, Ser: 5.97 mg/dL — ABNORMAL HIGH (ref 0.61–1.24)
GFR, Estimated: 9 mL/min — ABNORMAL LOW
Glucose, Bld: 87 mg/dL (ref 70–99)
Potassium: 3.4 mmol/L — ABNORMAL LOW (ref 3.5–5.1)
Sodium: 135 mmol/L (ref 135–145)
Total Bilirubin: 2.1 mg/dL — ABNORMAL HIGH (ref 0.0–1.2)
Total Protein: 5.9 g/dL — ABNORMAL LOW (ref 6.5–8.1)

## 2024-03-02 LAB — MAGNESIUM: Magnesium: 2.2 mg/dL (ref 1.7–2.4)

## 2024-03-02 LAB — PROTIME-INR
INR: 1 (ref 0.8–1.2)
Prothrombin Time: 14.3 s (ref 11.4–15.2)

## 2024-03-02 LAB — PROTEIN / CREATININE RATIO, URINE
Creatinine, Urine: 281 mg/dL
Protein Creatinine Ratio: 0.8 mg/mg — ABNORMAL HIGH
Total Protein, Urine: 232 mg/dL

## 2024-03-02 MED ORDER — LACTATED RINGERS IV SOLN: INTRAVENOUS | Status: AC

## 2024-03-02 MED ORDER — LACTATED RINGERS IV BOLUS
1000.0000 mL | Freq: Once | INTRAVENOUS | Status: AC
  Administered 2024-03-02: 1000 mL via INTRAVENOUS

## 2024-03-02 NOTE — Consult Note (Signed)
 "     Chief Complaint: Patient was seen in consultation today for acute cholecystitis  Chief Complaint  Patient presents with   multiple complaints   at the request of Sebastian Moles   Referring Physician(s):  Sebastian Moles   Supervising Physician: Vanice Revel  Patient Status: [facility] - In-pt  History of Present Illness: [name] is a 78 y.o. male with PMHs of HTN, HLD, duodenal ulcer, and acute cholecystitis, IR was consulted for perc chole placement.   Patient came to ED on 12/19 due to N/V/D. Lab showed leukocytosis, thrombocytopenia, elevated LFT, and AKI. CT A/P w/o showed:  1. Findings compatible with acute cholecystitis. No calcified choledocholithiasis. 2. There is a 2.5 x 2.7 cm soft tissue mass arising from the left kidney upper pole, which cannot be characterized as a simple cyst on this exam. Further evaluation with multiphasic contrast-enhanced MRI abdomen as per renal mass protocol is recommended. 3. 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 4. Ground-glass opacity in the right lower lobe, concerning for pneumonitis. Correlate clinically. 5. Multiple other nonacute observations, as described above.   Patient was admitted and general surgery was consulted for requested IR consult for perc chole placement. Case reviewed and approved by Dr. Vanice. Of note, nephrology was consulted for AKI, no indication of RRT at the moment.   Patient laying in bed, not in acute distress.  Reports RUQ pain.  Denise headache, fever, chills, shortness of breath, cough, chest pain, nausea ,vomiting, and bleeding.    Past Medical History:  Diagnosis Date   Arthritis    Cancer (HCC)    skin   COLONIC POLYPS, HX OF 09/13/2007   Qualifier: Diagnosis of  By: Tish MD, Elsie   Dr Oliva Boots, GI Colonoscopy @ 5 year intervals     DUODENAL ULCER, HX OF 09/13/2007   Qualifier: Diagnosis of  By: Tish MD, Elsie     GERD (gastroesophageal reflux disease)     Hyperlipidemia    Hypertension    Right bundle branch block 02/21/2022   SKIN CANCER, HX OF 09/01/2009   Qualifier: Diagnosis of  By: Tish MD, Elsie [place] [facility]  X 3-4; [place]  [facility]     Ulcer 1989 & 2000   bleedng X2    Past Surgical History:  Procedure Laterality Date   colonoscopy with polypectomy     Dr Boots   EYE SURGERY     post trauma with glss fragments   INGUINAL HERNIA REPAIR  1970   PARATHYROIDECTOMY Right 03/21/2022   Procedure: RIGHT INFERIOR PARATHYROIDECTOMY;  Surgeon: Eletha Boas, MD;  Location: [facility];  Service: General;  Laterality: Right;   pilonidal cystectomy     TOTAL HIP ARTHROPLASTY Right 12/06/2019   Procedure: RIGHT TOTAL HIP ARTHROPLASTY ANTERIOR APPROACH;  Surgeon: Vernetta Lonni GRADE, MD;  Location: [facility];  Service: Orthopedics;  Laterality: Right;    Allergies: Advil [ibuprofen] and Choline fenofibrate  Medications: Prior to Admission medications  Medication Sig Start Date End Date Taking? Authorizing Provider  atorvastatin  (LIPITOR) 40 MG tablet TAKE 1 TABLET BY MOUTH EVERY DAY 11/08/23  Yes Kennyth Worth HERO, MD  losartan  (COZAAR ) 50 MG tablet Take 1 tablet (50 mg total) by mouth daily. 02/02/24  Yes Kennyth Worth HERO, MD  metoprolol  succinate (TOPROL -XL) 25 MG 24 hr tablet Take 1 tablet (25 mg total) by mouth daily. Patient taking differently: Take 12.5 mg by mouth daily. 02/02/24  Yes Kennyth Worth HERO, MD  tadalafil  (  CIALIS ) 20 MG tablet Take 0.5-1 tablets (10-20 mg total) by mouth daily as needed for erectile dysfunction. 11/09/23   Kennyth Worth HERO, MD     Family History  Problem Relation Age of Onset   Stroke Mother        late 40s   Diabetes Brother        Type II   Hypertension Brother    Hyperlipidemia Brother    Heart disease Neg Hx    Cancer Neg Hx     Social History   Socioeconomic History   Marital status: Married    Spouse name: Not on file   Number of children: 2   Years of education: 16   Highest education  level: Associate degree: occupational, scientist, product/process development, or vocational program  Occupational History   Occupation: Retired   Tobacco Use   Smoking status: Former   Smokeless tobacco: Former    Types: Associate Professor status: Never Used  Substance and Sexual Activity   Alcohol use: No   Drug use: No   Sexual activity: Not on file  Other Topics Concern   Not on file  Social History Narrative   Not on file   Social Drivers of Health   Tobacco Use: Medium Risk (03/02/2024)   Patient History    Smoking Tobacco Use: Former    Smokeless Tobacco Use: Former    Passive Exposure: Not on Actuary Strain: Low Risk (12/22/2023)   Overall Financial Resource Strain (CARDIA)    Difficulty of Paying Living Expenses: Not hard at all  Food Insecurity: No Food Insecurity (03/01/2024)   Epic    Worried About Programme Researcher, Broadcasting/film/video in the Last Year: Never true    Ran Out of Food in the Last Year: Never true  Transportation Needs: No Transportation Needs (03/01/2024)   Epic    Lack of Transportation (Medical): No    Lack of Transportation (Non-Medical): No  Physical Activity: Insufficiently Active (12/22/2023)   Exercise Vital Sign    Days of Exercise per Week: 2 days    Minutes of Exercise per Session: 20 min  Stress: No Stress Concern Present (12/22/2023)   [employer] of Occupational Health - Occupational Stress Questionnaire    Feeling of Stress: Not at all  Social Connections: Socially Integrated (03/01/2024)   Social Connection and Isolation Panel    Frequency of Communication with Friends and Family: More than three times a week    Frequency of Social Gatherings with Friends and Family: Three times a week    Attends Religious Services: More than 4 times per year    Active Member of Clubs or Organizations: Yes    Attends Banker Meetings: More than 4 times per year    Marital Status: Married  Depression (PHQ2-9): Low Risk (12/26/2023)   Depression  (PHQ2-9)    PHQ-2 Score: 0  Alcohol Screen: Not on file  Housing: Low Risk (03/01/2024)   Epic    Unable to Pay for Housing in the Last Year: No    Number of Times Moved in the Last Year: 0    Homeless in the Last Year: No  Utilities: Not At Risk (03/01/2024)   Epic    Threatened with loss of utilities: No  Health Literacy: Not on file     Review of Systems: A 12 point ROS discussed and pertinent positives are indicated in the HPI above.  All other systems are negative.  Vital Signs:  BP 109/69 (BP Location: Left Arm)   Pulse 88   Temp 97.6 F (36.4 C) (Oral)   Resp 20   Ht 6' 1 (1.854 m)   Wt 191 lb 1.6 oz (86.7 kg)   SpO2 96%   BMI 25.21 kg/m    Physical Exam Vitals and nursing note reviewed.  Constitutional:      General: Patient is not in acute distress.    Appearance: Normal appearance. Patient is not ill-appearing.  HENT:     Head: Normocephalic and atraumatic.     Mouth/Throat:     Mouth: Mucous membranes are moist.     Pharynx: Oropharynx is clear.  Cardiovascular:     Rate and Rhythm: Normal rate and regular rhythm.     Pulses: Normal pulses.     Heart sounds: Normal heart sounds.  Pulmonary:     Effort: Pulmonary effort is normal.     Breath sounds: Normal breath sounds.  Abdominal:     General: Abdomen is flat. Bowel sounds are normal.     Palpations: Abdomen is soft.  Musculoskeletal:     Cervical back: Neck supple.  Skin:    General: Skin is warm and dry.     Coloration: Skin is not jaundiced or pale.  Neurological:     Mental Status: Patient is alert and oriented to person, place, and time.  Psychiatric:        Mood and Affect: Mood normal.        Behavior: Behavior normal.        Judgment: Judgment normal.    MD Evaluation Airway: WNL Heart: WNL Abdomen: WNL Chest/ Lungs: WNL ASA  Classification: 3 Mallampati/Airway Score: Two  Imaging: CT ABDOMEN PELVIS WO CONTRAST Result Date: 03/01/2024 CLINICAL DATA:  Abdominal pain, acute,  nonlocalized R abd pain, renal failure. EXAM: CT ABDOMEN AND PELVIS WITHOUT CONTRAST TECHNIQUE: Multidetector CT imaging of the abdomen and pelvis was performed following the standard protocol without IV contrast. RADIATION DOSE REDUCTION: This exam was performed according to the departmental dose-optimization program which includes automated exposure control, adjustment of the mA and/or kV according to patient size and/or use of iterative reconstruction technique. COMPARISON:  None Available. FINDINGS: Lower chest: There is ground-glass opacity in the right lower lobe, concerning for pneumonitis. There also associated linear areas of atelectasis/scarring in the right lower lobe. No pleural effusion. Mildly enlarged heart size. No pericardial effusion. There are coronary artery atherosclerotic calcifications, in keeping with coronary artery disease. Hepatobiliary: The liver is normal in size. Non-cirrhotic configuration. No suspicious mass. No intrahepatic or extrahepatic bile duct dilation. No calcified choledocholithiasis. The gallbladder is distended measuring up to 5.4 cm in width and exhibit mild-to-moderate circumferential wall thickening and moderate pericholecystic fat stranding. There are several subcentimeter sized faintly calcified gallstones. There are few prominent pericholecystic lymph nodes. Findings are compatible with acute cholecystitis. Pancreas: Unremarkable. No pancreatic ductal dilatation or surrounding inflammatory changes. Spleen: Within normal limits. No focal lesion. Adrenals/Urinary Tract: Unremarkable left adrenal gland. 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 Asymmetrically moderately atrophic right kidney noted. There is right extrarenal pelvis. There is mild fullness in the right renal collecting system. No right nephroureterolithiasis. There is a partially exophytic simple cyst arising from the left kidney interpolar region, posteriorly measuring 4.8 x 6.4 cm. There is a  partially exophytic 2.5 x 2.7 cm soft tissue mass arising from the left kidney upper pole, which cannot be characterized as a simple cyst on this exam. There is  indeterminate. Further evaluation with multiphasic contrast-enhanced MRI abdomen as per renal mass protocol is recommended. No left nephroureterolithiasis or obstructive uropathy. Stomach/Bowel: There is a small sliding hiatal hernia. There is a diverticulum arising from the second part of duodenum. No disproportionate dilation of the small or large bowel loops. Unremarkable appendix. 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 There is mild asymmetric thickening of the hepatic flexure of colon which is likely secondary to inflammation from acute cholecystitis. Vascular/Lymphatic: No ascites or pneumoperitoneum. No abdominal or pelvic lymphadenopathy, by size criteria. No aneurysmal dilation of the major abdominal arteries. There are moderate peripheral atherosclerotic vascular calcifications of the aorta and its major branches. Reproductive: Normal size prostate. Symmetric seminal vesicles. Other: There is a small fat containing umbilical hernia and bilateral small fat containing inguinal hernias. The soft tissues and abdominal wall are otherwise unremarkable. Musculoskeletal: No suspicious osseous lesions. There are mild - moderate multilevel degenerative changes in the visualized spine. IMPRESSION: 1. Findings compatible with acute cholecystitis. No calcified choledocholithiasis. 2. There is a 2.5 x 2.7 cm soft tissue mass arising from the left kidney upper pole, which cannot be characterized as a simple cyst on this exam. Further evaluation with multiphasic contrast-enhanced MRI abdomen as per renal mass protocol is recommended. 3. 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 4. Ground-glass opacity in the right lower lobe, concerning for pneumonitis. Correlate clinically. 5. Multiple other nonacute observations, as described above.  Aortic Atherosclerosis (ICD10-I70.0). Electronically Signed   By: Ree Molt M.D.   On: 03/01/2024 17:22    Labs:  CBC: Recent Labs    11/09/23 1436 03/01/24 1507 03/01/24 2223 03/02/24 0252  WBC 8.0 18.3* 19.8* 19.3*  HGB 13.8 14.8 14.8 14.4  HCT 41.8 41.6 43.2 41.6  PLT 212.0 83* 86* 81*    COAGS: Recent Labs    03/02/24 1338  INR 1.0    BMP: Recent Labs    11/21/23 1358 12/04/23 1357 03/01/24 1507 03/01/24 2223 03/02/24 0252  NA 138 141 135  --  135  K 4.1 3.6 2.9*  --  3.4*  CL 102 107 95*  --  102  CO2 25 27 25   --  16*  GLUCOSE 112* 114* 105*  --  87  BUN 21 12 70*  --  74*  CALCIUM  9.1 8.8 8.7*  --  8.0*  CREATININE 1.60* 1.37 6.00* 5.87* 5.97*  GFRNONAA  --   --  9* 9* 9*    LIVER FUNCTION TESTS: Recent Labs    04/07/23 1357 11/09/23 1436 03/01/24 1507 03/02/24 0252  BILITOT 0.7 0.7 2.5* 2.1*  AST 13 12 57* 61*  ALT 7 9 40 39  ALKPHOS 75 75 148* 142*  PROT 6.6 6.6 6.4* 5.9*  ALBUMIN 4.0 4.0 3.2* 2.6*    TUMOR MARKERS: No results for input(s): AFPTM, CEA, CA199, CHROMGRNA in the last 8760 hours.  Assessment and Plan: 78 y.o. male with acute cholecystitis who is in need of perc chole placement.   VSS CBC WBC 19.3, plt 81 (plt has to be above 50 for perc chole placement.) INR 1.0 today AC/AP: sq heparin   Allergies reviewed  Abx on Zosyn  q12 hrs   Risks and benefits discussed with the patient including, but not limited to bleeding, infection, gallbladder perforation, bile leak, sepsis or even death.  All of the patient's questions were answered, patient is agreeable to proceed. Consent signed and in IR.  Tentatively scheduled for tomorrow.   - NPO at [facility]  -  AM sq heparin  MAR held     Thank you for this interesting consult.  I greatly enjoyed meeting [name] and look forward to participating in their care.  A copy of this report was sent to the requesting provider on this date.  Electronically Signed: Toya VEAR Cousin, PA-C 03/02/2024, 2:24 PM   I spent a total of 40 Minutes    in face to face in clinical consultation, greater than 50% of which was counseling/coordinating care for perc chole placement.   This chart was dictated using voice recognition software.  Despite best efforts to proofread,  errors can occur which can change the documentation meaning.   "

## 2024-03-02 NOTE — Consult Note (Signed)
 "    [name] March 04, 1946  [id number].    Requesting MD: Raenelle, MD Chief Complaint/Reason for Consult: cholecystitis   HPI:  [name] is a 78 y/o M with PMH HTN, HLD who presents with nausea, vomiting, diarrhea, and abdominal pain. Reports Sunday 12/14 he got home from church and had a frozen PF chang meal for lunch. Reports one hour after this he developed nausea and multiple episodes of non-bloody vomiting followed by diarrhea. For the last 5 days he reports poor PO intake, abdominal soreness with some discomfort in his back as well, decreased urine output that is dark, and small, loose, non-bloody stools. He denies similar pain in his abdomen in the past or post-prandial pain. His wife has not had similar symptoms. Of note, he does reports about two weeks of low urine output before the violent vomiting/diarrhea that occurred on 12/14. At baseline he lives at home with his wife who is in her early 35's. He is independent of ADLs. Reports he is a former smoker who quit many years ago. Denies alcohol or drug use. Denies a previous history of abdominal surgery.   Denies a history of CVA or stroke. States that two years ago during/after a colonoscopy by Dr. Rosalie they noted PVCs on the monitor and he wore an outpatient heart monitor for 7 days. He does not remember the results but denies being told of any major abnormality. He denies chest pain on exertion. Is able to climb stairs without chest pain or taking a break,  ROS: Review of Systems  All other systems reviewed and are negative.   Family History  Problem Relation Age of Onset   Stroke Mother        late 79s   Diabetes Brother        Type II   Hypertension Brother    Hyperlipidemia Brother    Heart disease Neg Hx    Cancer Neg Hx     Past Medical History:  Diagnosis Date   Arthritis    Cancer (HCC)    skin   COLONIC POLYPS, HX OF 09/13/2007   Qualifier: Diagnosis of  By: Tish MD, Elsie   Dr Oliva Rosalie,  GI Colonoscopy @ 5 year intervals     DUODENAL ULCER, HX OF 09/13/2007   Qualifier: Diagnosis of  By: Tish MD, Elsie     GERD (gastroesophageal reflux disease)    Hyperlipidemia    Hypertension    Right bundle branch block 02/21/2022   SKIN CANCER, HX OF 09/01/2009   Qualifier: Diagnosis of  By: Tish MD, Elsie [facility]  X 3-4; [place]  [facility]     Ulcer 1989 & 2000   bleedng X2    Past Surgical History:  Procedure Laterality Date   colonoscopy with polypectomy     Dr Rosalie   EYE SURGERY     post trauma with glss fragments   INGUINAL HERNIA REPAIR  1970   PARATHYROIDECTOMY Right 03/21/2022   Procedure: RIGHT INFERIOR PARATHYROIDECTOMY;  Surgeon: Eletha Boas, MD;  Location: [facility];  Service: General;  Laterality: Right;   pilonidal cystectomy     TOTAL HIP ARTHROPLASTY Right 12/06/2019   Procedure: RIGHT TOTAL HIP ARTHROPLASTY ANTERIOR APPROACH;  Surgeon: Vernetta Lonni GRADE, MD;  Location: [facility];  Service: Orthopedics;  Laterality: Right;    Social History:  reports that he has quit smoking. He has quit using smokeless tobacco.  His smokeless tobacco use included chew. He reports that he  does not drink alcohol and does not use drugs.  Allergies: Allergies[1]  Medications Prior to Admission  Medication Sig Dispense Refill   atorvastatin  (LIPITOR) 40 MG tablet TAKE 1 TABLET BY MOUTH EVERY DAY 90 tablet 3   losartan  (COZAAR ) 50 MG tablet Take 1 tablet (50 mg total) by mouth daily. 90 tablet 3   metoprolol  succinate (TOPROL -XL) 25 MG 24 hr tablet Take 1 tablet (25 mg total) by mouth daily. (Patient taking differently: Take 12.5 mg by mouth daily.) 90 tablet 3   tadalafil  (CIALIS ) 20 MG tablet Take 0.5-1 tablets (10-20 mg total) by mouth daily as needed for erectile dysfunction. 90 tablet 1     Physical Exam: Blood pressure (!) 108/52, pulse 89, temperature (!) 97.5 F (36.4 C), temperature source Oral, resp. rate 19, height 6' 1 (1.854 m), weight 86.7 kg, SpO2  92%. General: Pleasant white male laying on hospital bed, appears stated age, NAD. HEENT: head -normocephalic, atraumatic; Eyes: PERRLA, no conjunctival injection;aniceric sclerae  Throat: pink mucosa, uvula midline, no exudates.  Neck- Trachea is midline, no thyromegaly or JVD appreciated.  CV- RRR, normal S1/S2, no M/R/G, radial and dorsalis pedis pulses 2+ BL, cap refill < 2 seconds. Pulm- breathing is non-labored. CTABL, no wheezes, rhales, rhonchi. Abd- soft, non-distended, mild TTP in right hemiabdomen, worse in RUQ, negative murphy's sign, no peritonitis, no hernias/masses GU- deferred  MSK- UE/LE symmetrical, no cyanosis, clubbing, or edema. Neuro- CN II-XII grossly in tact, no paresthesias. Psych- Alert and Oriented x3 with appropriate affect Skin: warm and dry, no rashes or lesions   Results for orders placed or performed during the hospital encounter of 03/01/24 (from the past 48 hours)  Lipase, blood     Status: Abnormal   Collection Time: 03/01/24  3:07 PM  Result Value Ref Range   Lipase 97 (H) 11 - 51 U/L    Comment: Performed at [facility], [address] [place], [place] [address]  Comprehensive metabolic panel     Status: Abnormal   Collection Time: 03/01/24  3:07 PM  Result Value Ref Range   Sodium 135 135 - 145 mmol/L   Potassium 2.9 (L) 3.5 - 5.1 mmol/L   Chloride 95 (L) 98 - 111 mmol/L   CO2 25 22 - 32 mmol/L   Glucose, Bld 105 (H) 70 - 99 mg/dL    Comment: Glucose reference range applies only to samples taken after fasting for at least 8 hours.   BUN 70 (H) 8 - 23 mg/dL   Creatinine, Ser 3.99 (H) 0.61 - 1.24 mg/dL   Calcium  8.7 (L) 8.9 - 10.3 mg/dL   Total Protein 6.4 (L) 6.5 - 8.1 g/dL   Albumin 3.2 (L) 3.5 - 5.0 g/dL   AST 57 (H) 15 - 41 U/L   ALT 40 0 - 44 U/L   Alkaline Phosphatase 148 (H) 38 - 126 U/L   Total Bilirubin 2.5 (H) 0.0 - 1.2 mg/dL   GFR, Estimated 9 (L) >60 mL/min    Comment: (NOTE) Calculated using the CKD-EPI Creatinine  Equation (2021)    Anion gap 15 5 - 15    Comment: Performed at [facility], [address] [place], [place] [address]  CBC     Status: Abnormal   Collection Time: 03/01/24  3:07 PM  Result Value Ref Range   WBC 18.3 (H) 4.0 - 10.5 K/uL   RBC 4.96 4.22 - 5.81 MIL/uL   Hemoglobin 14.8 13.0 - 17.0  g/dL   HCT 58.3 60.9 - 47.9 %   MCV 83.9 80.0 - 100.0 fL   MCH 29.8 26.0 - 34.0 pg   MCHC 35.6 30.0 - 36.0 g/dL   RDW 85.9 88.4 - 84.4 %   Platelets 83 (L) 150 - 400 K/uL    Comment: REPEATED TO VERIFY Immature Platelet Fraction may be clinically indicated, consider ordering this additional test [id number]    nRBC 0.0 0.0 - 0.2 %    Comment: Performed at [facility], [address] [place], [place] [address]  Resp panel by RT-PCR (RSV, Flu A&B, Covid) Anterior Nasal Swab     Status: None   Collection Time: 03/01/24  3:08 PM   Specimen: Anterior Nasal Swab  Result Value Ref Range   SARS Coronavirus 2 by RT PCR NEGATIVE NEGATIVE    Comment: (NOTE) SARS-CoV-2 target nucleic acids are NOT DETECTED.  The SARS-CoV-2 RNA is generally detectable in upper respiratory specimens during the acute phase of infection. The lowest concentration of SARS-CoV-2 viral copies this assay can detect is 138 copies/mL. A negative result does not preclude SARS-Cov-2 infection and should not be used as the sole basis for treatment or other patient management decisions. A negative result may occur with  improper specimen collection/handling, submission of specimen other than nasopharyngeal swab, presence of viral mutation(s) within the areas targeted by this assay, and inadequate number of viral copies(<138 copies/mL). A negative result must be combined with clinical observations, patient history, and epidemiological information. The expected result is Negative.  Fact Sheet for Patients:  bloggercourse.com  Fact Sheet for Healthcare Providers:   seriousbroker.it  This test is no t yet approved or cleared by the [place]  FDA and  has been authorized for detection and/or diagnosis of SARS-CoV-2 by FDA under an Emergency Use Authorization (EUA). This EUA will remain  in effect (meaning this test can be used) for the duration of the COVID-19 declaration under Section 564(b)(1) of the Act, 21 U.S.C.section 360bbb-3(b)(1), unless the authorization is terminated  or revoked sooner.       Influenza A by PCR NEGATIVE NEGATIVE   Influenza B by PCR NEGATIVE NEGATIVE    Comment: (NOTE) The Xpert Xpress SARS-CoV-2/FLU/RSV plus assay is intended as an aid in the diagnosis of influenza from Nasopharyngeal swab specimens and should not be used as a sole basis for treatment. Nasal washings and aspirates are unacceptable for Xpert Xpress SARS-CoV-2/FLU/RSV testing.  Fact Sheet for Patients: bloggercourse.com  Fact Sheet for Healthcare Providers: seriousbroker.it  This test is not yet approved or cleared by the [place]  FDA and has been authorized for detection and/or diagnosis of SARS-CoV-2 by FDA under an Emergency Use Authorization (EUA). This EUA will remain in effect (meaning this test can be used) for the duration of the COVID-19 declaration under Section 564(b)(1) of the Act, 21 U.S.C. section 360bbb-3(b)(1), unless the authorization is terminated or revoked.     Resp Syncytial Virus by PCR NEGATIVE NEGATIVE    Comment: (NOTE) Fact Sheet for Patients: bloggercourse.com  Fact Sheet for Healthcare Providers: seriousbroker.it  This test is not yet approved or cleared by the [place]  FDA and has been authorized for detection and/or diagnosis of SARS-CoV-2 by FDA under an Emergency Use Authorization (EUA). This EUA will remain in effect (meaning this test can be used) for the duration of  the COVID-19 declaration under Section 564(b)(1) of the Act, 21 U.S.C. section 360bbb-3(b)(1), unless the authorization is terminated or revoked.  Performed  at [facility], [address], [place], [place] [address]   Magnesium     Status: Abnormal   Collection Time: 03/01/24  3:08 PM  Result Value Ref Range   Magnesium 2.5 (H) 1.7 - 2.4 mg/dL    Comment: Performed at [facility], [address] [place], [place] [address]  Blood culture (routine x 2)     Status: None (Preliminary result)   Collection Time: 03/01/24  5:25 PM   Specimen: BLOOD  Result Value Ref Range   Specimen Description      BLOOD RIGHT ANTECUBITAL Performed at [facility], [address] [place], [place] [address]    Special Requests      BOTTLES DRAWN AEROBIC AND ANAEROBIC Blood Culture adequate volume Performed at [facility], [address] [place], [place] [address]    Culture      NO GROWTH < 12 HOURS Performed at [facility], [address], [place], [place] [address]    Report Status PENDING   Lactic acid, plasma     Status: None   Collection Time: 03/01/24  5:41 PM  Result Value Ref Range   Lactic Acid, Venous 1.5 0.5 - 1.9 mmol/L    Comment: Performed at [facility], [address] [place], [place] [address]  Blood culture (routine x 2)     Status: None (Preliminary result)   Collection Time: 03/01/24  5:41 PM   Specimen: BLOOD RIGHT FOREARM  Result Value Ref Range   Specimen Description      BLOOD RIGHT FOREARM Performed at [facility], [address] [place], [place] [address]    Special Requests      BOTTLES DRAWN AEROBIC AND ANAEROBIC Blood Culture adequate volume Performed at [facility], [address] [place], [place] [address]    Culture      NO GROWTH < 12 HOURS Performed at [facility], [address], [place], [place] [address]    Report Status PENDING   Lactic acid, plasma      Status: None   Collection Time: 03/01/24  7:40 PM  Result Value Ref Range   Lactic Acid, Venous 1.3 0.5 - 1.9 mmol/L    Comment: Performed at [facility], [address] [place], [place] [address]  C Difficile Quick Screen w PCR reflex     Status: None   Collection Time: 03/01/24 10:05 PM   Specimen: Stool  Result Value Ref Range   C Diff antigen NEGATIVE NEGATIVE   C Diff toxin NEGATIVE NEGATIVE   C Diff interpretation No C. difficile detected.     Comment: Performed at [facility], [address] [place], [place] [address]  Urinalysis, Routine w reflex microscopic -Urine, Clean Catch     Status: Abnormal   Collection Time: 03/01/24 10:13 PM  Result Value Ref Range   Color, Urine YELLOW YELLOW   APPearance CLOUDY (A) CLEAR   Specific Gravity, Urine >1.030 (H) 1.005 - 1.030   pH 5.0 5.0 - 8.0   Glucose, UA 100 (A) NEGATIVE mg/dL   Hgb urine dipstick TRACE (A) NEGATIVE   Bilirubin Urine MODERATE (A) NEGATIVE   Ketones, ur NEGATIVE NEGATIVE mg/dL   Protein, ur >699 (A) NEGATIVE mg/dL   Nitrite POSITIVE (A) NEGATIVE   Leukocytes,Ua TRACE (A) NEGATIVE    Comment: Performed at [facility], [address] [place], [place] [address]  Urinalysis, Microscopic (reflex)     Status: Abnormal   Collection Time: 03/01/24 10:13 PM  Result Value Ref Range   RBC / HPF 0-5 0 - 5 RBC/hpf   WBC, UA 6-10 0 - 5 WBC/hpf   Bacteria, UA MANY (A) NONE SEEN   Squamous Epithelial / HPF 0-5 0 - 5 /HPF   Mucus PRESENT    Amorphous Crystal PRESENT     Comment: Performed at [facility], [address], [place], [place] [address]  CBC     Status: Abnormal   Collection Time: 03/01/24 10:23 PM  Result Value Ref Range   WBC 19.8 (H) 4.0 - 10.5 K/uL   RBC 5.03 4.22 - 5.81 MIL/uL   Hemoglobin 14.8 13.0 - 17.0 g/dL   HCT 56.7 60.9 - 47.9 %   MCV 85.9 80.0 - 100.0 fL   MCH 29.4 26.0 - 34.0 pg   MCHC 34.3 30.0 - 36.0 g/dL   RDW 85.6 88.4 - 84.4 %   Platelets 86 (L) 150  - 400 K/uL    Comment: PLATELET COUNT CONFIRMED BY SMEAR REPEATED TO VERIFY Immature Platelet Fraction may be clinically indicated, consider ordering this additional test [id number]    nRBC 0.0 0.0 - 0.2 %    Comment: Performed at [facility], [address], [place], [place] [address]  Creatinine, serum     Status: Abnormal   Collection Time: 03/01/24 10:23 PM  Result Value Ref Range   Creatinine, Ser 5.87 (H) 0.61 - 1.24 mg/dL   GFR, Estimated 9 (L) >60 mL/min    Comment: (NOTE) Calculated using the CKD-EPI Creatinine Equation (2021) Performed at [facility], [address] [place], [place] [address]   CBC     Status: Abnormal   Collection Time: 03/02/24  2:52 AM  Result Value Ref Range   WBC 19.3 (H) 4.0 - 10.5 K/uL   RBC 4.89 4.22 - 5.81 MIL/uL   Hemoglobin 14.4 13.0 - 17.0 g/dL   HCT 58.3 60.9 - 47.9 %   MCV 85.1 80.0 - 100.0 fL   MCH 29.4 26.0 - 34.0 pg   MCHC 34.6 30.0 - 36.0 g/dL   RDW 85.6 88.4 - 84.4 %   Platelets 81 (L) 150 - 400 K/uL    Comment: REPEATED TO VERIFY Immature Platelet Fraction may be clinically indicated, consider ordering this additional test [id number]    nRBC 0.0 0.0 - 0.2 %    Comment: Performed at [facility], [address], [place], [place] [address]  Comprehensive metabolic panel     Status: Abnormal   Collection Time: 03/02/24  2:52 AM  Result Value Ref Range   Sodium 135 135 - 145 mmol/L   Potassium 3.4 (L) 3.5 - 5.1 mmol/L   Chloride 102 98 - 111 mmol/L   CO2 16 (L) 22 - 32 mmol/L   Glucose, Bld 87 70 - 99 mg/dL    Comment: Glucose reference range applies only to samples taken after fasting for at least 8 hours.   BUN 74 (H) 8 - 23 mg/dL   Creatinine, Ser 4.02 (H) 0.61 - 1.24 mg/dL   Calcium  8.0 (L) 8.9 - 10.3 mg/dL   Total Protein 5.9 (L) 6.5 - 8.1 g/dL   Albumin 2.6 (L) 3.5 - 5.0 g/dL   AST 61 (H) 15 - 41 U/L    Comment: HEMOLYSIS AT THIS LEVEL MAY AFFECT  RESULT   ALT 39 0 - 44 U/L   Alkaline Phosphatase  142 (H) 38 - 126 U/L   Total Bilirubin 2.1 (H) 0.0 - 1.2 mg/dL   GFR, Estimated 9 (L) >60 mL/min    Comment: (NOTE) Calculated using the CKD-EPI Creatinine Equation (2021)    Anion gap 17 (H) 5 - 15    Comment: Performed at [facility], [address], [place], [place] [address]  Magnesium     Status: None   Collection Time: 03/02/24  2:52 AM  Result Value Ref Range   Magnesium 2.2 1.7 - 2.4 mg/dL    Comment: Performed at [facility], [address] [place], [place] [address]   CT ABDOMEN PELVIS WO CONTRAST Result Date: 03/01/2024 CLINICAL DATA:  Abdominal pain, acute, nonlocalized R abd pain, renal failure. EXAM: CT ABDOMEN AND PELVIS WITHOUT CONTRAST TECHNIQUE: Multidetector CT imaging of the abdomen and pelvis was performed following the standard protocol without IV contrast. RADIATION DOSE REDUCTION: This exam was performed according to the departmental dose-optimization program which includes automated exposure control, adjustment of the mA and/or kV according to patient size and/or use of iterative reconstruction technique. COMPARISON:  None Available. FINDINGS: Lower chest: There is ground-glass opacity in the right lower lobe, concerning for pneumonitis. There also associated linear areas of atelectasis/scarring in the right lower lobe. No pleural effusion. Mildly enlarged heart size. No pericardial effusion. There are coronary artery atherosclerotic calcifications, in keeping with coronary artery disease. Hepatobiliary: The liver is normal in size. Non-cirrhotic configuration. No suspicious mass. No intrahepatic or extrahepatic bile duct dilation. No calcified choledocholithiasis. The gallbladder is distended measuring up to 5.4 cm in width and exhibit mild-to-moderate circumferential wall thickening and moderate pericholecystic fat stranding. There are several subcentimeter sized faintly calcified gallstones. There are few prominent pericholecystic lymph nodes. Findings are  compatible with acute cholecystitis. Pancreas: Unremarkable. No pancreatic ductal dilatation or surrounding inflammatory changes. Spleen: Within normal limits. No focal lesion. Adrenals/Urinary Tract: Unremarkable left adrenal gland. 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 Asymmetrically moderately atrophic right kidney noted. There is right extrarenal pelvis. There is mild fullness in the right renal collecting system. No right nephroureterolithiasis. There is a partially exophytic simple cyst arising from the left kidney interpolar region, posteriorly measuring 4.8 x 6.4 cm. There is a partially exophytic 2.5 x 2.7 cm soft tissue mass arising from the left kidney upper pole, which cannot be characterized as a simple cyst on this exam. There is indeterminate. Further evaluation with multiphasic contrast-enhanced MRI abdomen as per renal mass protocol is recommended. No left nephroureterolithiasis or obstructive uropathy. Stomach/Bowel: There is a small sliding hiatal hernia. There is a diverticulum arising from the second part of duodenum. No disproportionate dilation of the small or large bowel loops. Unremarkable appendix. 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 There is mild asymmetric thickening of the hepatic flexure of colon which is likely secondary to inflammation from acute cholecystitis. Vascular/Lymphatic: No ascites or pneumoperitoneum. No abdominal or pelvic lymphadenopathy, by size criteria. No aneurysmal dilation of the major abdominal arteries. There are moderate peripheral atherosclerotic vascular calcifications of the aorta and its major branches. Reproductive: Normal size prostate. Symmetric seminal vesicles. Other: There is a small fat containing umbilical hernia and bilateral small fat containing inguinal hernias. The soft tissues and abdominal wall are otherwise unremarkable. Musculoskeletal: No suspicious osseous lesions. There are mild - moderate multilevel  degenerative changes in the visualized spine. IMPRESSION: 1. Findings compatible  with acute cholecystitis. No calcified choledocholithiasis. 2. There is a 2.5 x 2.7 cm soft tissue mass arising from the left kidney upper pole, which cannot be characterized as a simple cyst on this exam. Further evaluation with multiphasic contrast-enhanced MRI abdomen as per renal mass protocol is recommended. 3. 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 4. Ground-glass opacity in the right lower lobe, concerning for pneumonitis. Correlate clinically. 5. Multiple other nonacute observations, as described above. Aortic Atherosclerosis (ICD10-I70.0). Electronically Signed   By: Ree Molt M.D.   On: 03/01/2024 17:22      Latest Ref Rng & Units 03/02/2024    2:52 AM 03/01/2024    3:07 PM 11/09/2023    2:36 PM  Hepatic Function  Total Protein 6.5 - 8.1 g/dL 5.9  6.4  6.6   Albumin 3.5 - 5.0 g/dL 2.6  3.2  4.0   AST 15 - 41 U/L 61  57  12   ALT 0 - 44 U/L 39  40  9   Alk Phosphatase 38 - 126 U/L 142  148  75   Total Bilirubin 0.0 - 1.2 mg/dL 2.1  2.5  0.7       Assessment/Plan 78 y/o M who had an episode of violent emesis and diarrhea on 12/14 followed by RUQ abdominal pain and multiple days of poor PO intake, dry heaves, and decreased urine output.   Cholelithiasis with cholecystitis  - afebrile, WBC 19 - LFTs slightly elevated as above - CT A/P yesterday w/ acute cholecystitis, no choledocho appreciated.  - tender in RUQ, consisted with cholecystitis. His history of severe vomiting and diarrhea does sound more like gastroenteritis. GI panel is pending. - will likely need lap chole this admission. Will confirm timing with MD. With confirm no additional testing (like RUQ U/S) needed.   FEN - CLD, NPO MN VTE - SQH ID - Zosyn ; stool studies pending. 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  Almarie GORMAN Pringle, PA-C [facility] 03/02/2024, 8:41 AM Please see Amion for pager number during day hours 7:00am-4:30pm or 7:00am -11:30am on weekends     [1]  Allergies Allergen Reactions   Advil [Ibuprofen] Other (See Comments)    Hx of bleeding ulcer   Choline Fenofibrate Other (See Comments)    ABDOMINAL PAIN, CONSTIPATION   "

## 2024-03-02 NOTE — Consult Note (Signed)
 Nephrology Consult   Service requesting consult: [facility] Reason for consult: AKI  Assessment/Recommendations:   AKI on CKD3a, oliguric -baseline Cr around 1.1-1.3 -suspect AKI is secondary to prolonged pre-renal injury with concomitant ARB use.  No obstruction on imaging.  Peak creatinine 6 on presentation, stable today - UA indicative of UTI, receiving empiric antibiotics.  With proteinuria however concentrated sample, will quantify -continue with supportive care for now, will switch his NS to LR (1L bolus followed by 125cc/hr) -no indications for renal replacement therapy at this junction, attempt to hold off and hope for renal recovery with supportive care -Avoid nephrotoxic medications including NSAIDs and iodinated intravenous contrast exposure unless the latter is absolutely indicated.  Preferred narcotic agents for pain control are hydromorphone , fentanyl , and methadone. Morphine should not be used. Avoid Baclofen and avoid oral sodium phosphate  and magnesium citrate based laxatives / bowel preps. Continue strict Input and Output monitoring. Will monitor the patient closely with you and intervene or adjust therapy as indicated by changes in clinical status/labs   Acute cholecystitis -per primary service. Surgery following  Sepsis -secondary to gastroenteritis +/- cholecystitis - On empiric antibiotics - Per primary  N/V/D -per primary service, receiving fluids  Left renal mass -recommend MRI with and without contrast as outpatient. If here longer than expected then can be done prior to discharge, will need to see urology if mass is suspicious for RCC  AGMA -secondary to AKI, confounded by diarrhea -switch NS to LR as above  Hypokalemia -improved, hold on further repletion for today  Elevated LFT's -continue to trend per primary  HTN -BP soft, hold anti-HTNs -fluids as above  Thrombocytopenia -work up per primary  Recommendations conveyed to primary service.     Ephriam Stank [facility] 03/02/2024 9:23 AM   _____________________________________________________________________________________   History of Present Illness: [name] is a/an 78 y.o. male with a past medical history of hypertension, hyperlipidemia who presents to [facility] with N/V/D, transferred here for further management.  Symptoms started on 12/14, has had numerous episodes of vomiting and diarrhea since then.  Has also had abdominal discomfort along with low urinary frequency.  In the ER he was found to have severe AKI with a creatinine up to 6, hypokalemia.  Imaging (CT abdomen without contrast) revealed acute cholecystitis. Patient seen in room. He reports that he still feels a little nauseated, had diarrhea this AM. Still with minimal urine output. Denies any chest pain, SOB, swelling. 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   Medications:  Current Facility-Administered Medications  Medication Dose Route Frequency Provider Last Rate Last Admin   0.9 %  sodium chloride  infusion   Intravenous Continuous Ghimire, Shanker M, MD 150 mL/hr at 03/02/24 0411 New Bag at 03/02/24 0411   acetaminophen  (TYLENOL ) tablet 650 mg  650 mg Oral Q6H PRN Raenelle Donalda HERO, MD       Or   acetaminophen  (TYLENOL ) suppository 650 mg  650 mg Rectal Q6H PRN Ghimire, Donalda HERO, MD       albuterol  (PROVENTIL ) (2.5 MG/3ML) 0.083% nebulizer solution 2.5 mg  2.5 mg Nebulization Q2H PRN Raenelle Donalda HERO, MD       heparin  injection 5,000 Units  5,000 Units Subcutaneous Q8H Raenelle Donalda HERO, MD   5,000 Units at 03/02/24 0540   ondansetron  (ZOFRAN ) injection 4 mg  4 mg Intravenous Q6H PRN Ghimire, Donalda HERO, MD       piperacillin -tazobactam (ZOSYN ) IVPB 3.375 g  3.375 g Intravenous Q12H Oriet,  Dorn, [facility]         ALLERGIES Advil [ibuprofen] and Choline fenofibrate  MEDICAL HISTORY Past Medical History:  Diagnosis Date   Arthritis    Cancer (HCC)     skin   COLONIC POLYPS, HX OF 09/13/2007   Qualifier: Diagnosis of  By: Tish MD, Elsie   Dr Oliva Boots, GI Colonoscopy @ 5 year intervals     DUODENAL ULCER, HX OF 09/13/2007   Qualifier: Diagnosis of  By: Tish MD, Elsie     GERD (gastroesophageal reflux disease)    Hyperlipidemia    Hypertension    Right bundle branch block 02/21/2022   SKIN CANCER, HX OF 09/01/2009   Qualifier: Diagnosis of  By: Tish MD, Elsie [place] [facility]  X 3-4; [place]  [facility]     Ulcer 1989 & 2000   bleedng X2     SOCIAL HISTORY Social History   Socioeconomic History   Marital status: Married    Spouse name: Not on file   Number of children: 2   Years of education: 16   Highest education level: Associate degree: occupational, scientist, product/process development, or vocational program  Occupational History   Occupation: Retired   Tobacco Use   Smoking status: Former   Smokeless tobacco: Former    Types: Associate Professor status: Never Used  Substance and Sexual Activity   Alcohol use: No   Drug use: No   Sexual activity: Not on file  Other Topics Concern   Not on file  Social History Narrative   Not on file   Social Drivers of Health   Tobacco Use: Medium Risk (03/01/2024)   Patient History    Smoking Tobacco Use: Former    Smokeless Tobacco Use: Former    Passive Exposure: Not on Actuary Strain: Low Risk (12/22/2023)   Overall Financial Resource Strain (CARDIA)    Difficulty of Paying Living Expenses: Not hard at all  Food Insecurity: No Food Insecurity (03/01/2024)   Epic    Worried About Radiation Protection Practitioner of Food in the Last Year: Never true    Ran Out of Food in the Last Year: Never true  Transportation Needs: No Transportation Needs (03/01/2024)   Epic    Lack of Transportation (Medical): No    Lack of Transportation (Non-Medical): No  Physical Activity: Insufficiently Active (12/22/2023)   Exercise Vital Sign    Days of Exercise per Week: 2 days    Minutes of Exercise per  Session: 20 min  Stress: No Stress Concern Present (12/22/2023)   [employer] of Occupational Health - Occupational Stress Questionnaire    Feeling of Stress: Not at all  Social Connections: Socially Integrated (03/01/2024)   Social Connection and Isolation Panel    Frequency of Communication with Friends and Family: More than three times a week    Frequency of Social Gatherings with Friends and Family: Three times a week    Attends Religious Services: More than 4 times per year    Active Member of Clubs or Organizations: Yes    Attends Banker Meetings: More than 4 times per year    Marital Status: Married  Catering Manager Violence: Not At Risk (03/01/2024)   Epic    Fear of Current or Ex-Partner: No    Emotionally Abused: No    Physically Abused: No    Sexually Abused: No  Depression (PHQ2-9): Low Risk (12/26/2023)   Depression (PHQ2-9)    PHQ-2 Score: 0  Alcohol Screen: Not on file  Housing: Low Risk (03/01/2024)   Epic    Unable to Pay for Housing in the Last Year: No    Number of Times Moved in the Last Year: 0    Homeless in the Last Year: No  Utilities: Not At Risk (03/01/2024)   Epic    Threatened with loss of utilities: No  Health Literacy: Not on file     FAMILY HISTORY Family History  Problem Relation Age of Onset   Stroke Mother        late 41s   Diabetes Brother        Type II   Hypertension Brother    Hyperlipidemia Brother    Heart disease Neg Hx    Cancer Neg Hx     Review of Systems: 12 systems reviewed Otherwise as per HPI, all other systems reviewed and negative  Physical Exam: Vitals:   03/02/24 0600 03/02/24 0830  BP:    Pulse: 89   Resp:    Temp:  (!) 97.5 F (36.4 C)  SpO2: 92%    No intake/output data recorded.  Intake/Output Summary (Last 24 hours) at 03/02/2024 9076 Last data filed at 03/02/2024 9355 Gross per 24 hour  Intake 1388.01 ml  Output 20 ml  Net 1368.01 ml   General: well-appearing, no acute  distress, laying flat in bed HEENT: anicteric sclera, oropharynx clear without lesions CV: regular rate, normal rhythm, no murmurs, no gallops, no rubs, no peripheral edema Lungs: clear to auscultation bilaterally, normal work of breathing Abd: soft, tender and slightly distended Skin: no visible lesions or rashes Psych: alert, engaged, appropriate mood and affect Musculoskeletal: no obvious deformities Neuro: normal speech, no gross focal deficits, no asterixis  Test Results Reviewed Lab Results  Component Value Date   NA 135 03/02/2024   K 3.4 (L) 03/02/2024   CL 102 03/02/2024   CO2 16 (L) 03/02/2024   BUN 74 (H) 03/02/2024   CREATININE 5.97 (H) 03/02/2024   GFR 50.32 (L) 12/04/2023   CALCIUM  8.0 (L) 03/02/2024   ALBUMIN 2.6 (L) 03/02/2024     I have reviewed all relevant outside healthcare records related to the patient's kidney injury.

## 2024-03-02 NOTE — Evaluation (Signed)
 Physical Therapy Evaluation Patient Details Name: [name] MRN: [id number] DOB: Jul 20, 1945 Today's Date: 03/02/2024  History of Present Illness  78 y.o. male presents to [facility] hospital on 03/01/2024 with nausea, vomiting and diarrhea. Pt admitted with AKI, hypokalemia and acute cholecystitis. PMH includes HTN, HLD, OA, RBBB.  Clinical Impression  Pt presents to PT without significant deficits in mobility at this time. Pt is able to ambulate for household distances independently and denies concerns about mobility at this time. Pt has no current acute PT needs and is encouraged to mobilize frequently daily to maintain his level of function. PT signing off.        If plan is discharge home, recommend the following:     Can travel by private vehicle        Equipment Recommendations None recommended by PT  Recommendations for Other Services       Functional Status Assessment Patient has not had a recent decline in their functional status     Precautions / Restrictions Precautions Precautions: None Restrictions Weight Bearing Restrictions Per Provider Order: No      Mobility  Bed Mobility Overal bed mobility: Independent                  Transfers Overall transfer level: Independent                      Ambulation/Gait Ambulation/Gait assistance: Independent Gait Distance (Feet): 150 Feet Assistive device: None Gait Pattern/deviations: WFL(Within Functional Limits) Gait velocity: functional Gait velocity interpretation: 1.31 - 2.62 ft/sec, indicative of limited community ambulator   General Gait Details: steady step-through gait  Stairs            Wheelchair Mobility     Tilt Bed    Modified Rankin (Stroke Patients Only)       Balance Overall balance assessment: Independent                                           Pertinent Vitals/Pain Pain Assessment Pain Assessment: 0-10 Pain Score: 5  Pain Location:  lower abdomen Pain Descriptors / Indicators: Discomfort Pain Intervention(s): Limited activity within patient's tolerance    Home Living Family/patient expects to be discharged to:: Private residence Living Arrangements: Spouse/significant other Available Help at Discharge: Family;Available 24 hours/day Type of Home: House Home Access: Stairs to enter Entrance Stairs-Rails: Left Entrance Stairs-Number of Steps: 5   Home Layout: One level Home Equipment: Pharmacist, Hospital (2 wheels);Grab bars - toilet      Prior Function Prior Level of Function : Independent/Modified Independent;Driving                     Extremity/Trunk Assessment   Upper Extremity Assessment Upper Extremity Assessment: Overall WFL for tasks assessed    Lower Extremity Assessment Lower Extremity Assessment: Overall WFL for tasks assessed    Cervical / Trunk Assessment Cervical / Trunk Assessment: Normal  Communication   Communication Communication: No apparent difficulties    Cognition Arousal: Alert Behavior During Therapy: WFL for tasks assessed/performed   PT - Cognitive impairments: No apparent impairments                         Following commands: Intact       Cueing Cueing Techniques: Verbal cues     General Comments General  comments (skin integrity, edema, etc.): VSS on RA    Exercises     Assessment/Plan    PT Assessment Patient does not need any further PT services  PT Problem List         PT Treatment Interventions      PT Goals (Current goals can be found in the Care Plan section)       Frequency       Co-evaluation               AM-PAC PT 6 Clicks Mobility  Outcome Measure Help needed turning from your back to your side while in a flat bed without using bedrails?: None Help needed moving from lying on your back to sitting on the side of a flat bed without using bedrails?: None Help needed moving to and from a bed to a chair  (including a wheelchair)?: None Help needed standing up from a chair using your arms (e.g., wheelchair or bedside chair)?: None Help needed to walk in hospital room?: None Help needed climbing 3-5 steps with a railing? : None 6 Click Score: 24    End of Session   Activity Tolerance: Patient tolerated treatment well Patient left: in bed;with call bell/phone within reach Nurse Communication: Mobility status PT Visit Diagnosis: Other abnormalities of gait and mobility (R26.89)    Time: 8853-8796 PT Time Calculation (min) (ACUTE ONLY): 17 min   Charges:   PT Evaluation $PT Eval Low Complexity: 1 Low   PT General Charges $$ ACUTE PT VISIT: 1 Visit         Bernardino JINNY Ruth, PT, DPT Acute Rehabilitation Office [phone]   Bernardino JINNY Ruth 03/02/2024, 12:32 PM

## 2024-03-02 NOTE — Progress Notes (Signed)
 " PROGRESS NOTE    [name]  FMW:[id number] DOB: 11-Nov-1945 DOA: 03/01/2024 PCP: Kennyth Worth HERO, MD    Brief Narrative:  Patient is a 78 year old male with PMHx of HTN and HLD who presented to the ED on 03/01/2024 with several days of nausea, vomiting, and diarrhea.  Symptoms began on 12/14 after returning from church and eating a frozen [employer] chicken meal, followed about an hour later by chills/subjective fevers with rigors, then profuse emesis and subsequent multiple episodes of loose watery stools.  Emesis progressed from food contents to watery, nonbloody.  Stools watery without blood or mucus.  He reports poor p.o. intake since Sunday and generalized, nonfocal abdominal discomfort.  Also notes markedly decreased urine output over the past 2 days with dark orange urine, without gross hematuria.  In the ED he was found to have AKI and hypokalemia, with CT abdomen findings concerning for acute cholecystitis.  He was admitted to the hospital service for further management.  Assessment and Plan:  AKI on CKD stage IIIa  Oliguria - Likely prerenal in the setting of decreased PO intake and increased GI losses with concomitant Losartan  use - Cr elevated to 6.00 on presentation, baseline ~ 1.1-1.3 - Currently producing very little urine - Nephrology following, recommended 1L LR bolus followed by LR 125 mL/hr - Strict I&O, bladder scan q8h  HAGMA - Bicarb 16, AG corrected for albumin 20.5, delta ratio 1.1 indicating uncomplicated HAGMA - Lactic acid wnl - Likely secondary to acute renal failure - IVF as above - Nephrology following  Sepsis - Met SIRS criteria on admission with tachycardia, tachypnea, leukocytosis, likely secondary to cholecystitis, gastroenteritis and/or acute cystitis - Blood cultures pending - Continue IV Zosyn   Acute cholecystitis - Has RUQ pain on palpation with CT evidence of acute cholecystitis without CBD dilation - General surgery consulted,  recommending placement of percutaneous cholecystostomy tubes by IR given ongoing AKI and thrombocytopenia with plans for cholecystectomy at a later time - IR consulted for PCT placement, tentatively scheduled for tomorrow - Continue IV Zosyn   Gastroenteritis - Patient presented with N/V and diarrhea which started after eating frozen meal - Symptoms improving - GIPP and C diff PCR negative - Continue conservative management with IVFs  Possible UTI - UA consistent with UTI - Urine culture ordered - On IV Zosyn   Hypokalemia - improving - K 3.4 - Discussed with nephrology, will hold off repletion now  Thrombocytopenia - Platelets decreased but relatively stable - No overt signs/symptoms of bleeding - Monitoring  HTN - BP currently low-normal  - Continue holding home antihypertensives  Left renal mass - CT abd/pel showed soft tissue mass on left kidney - Will need MRI with contrast per renal mass protocol at discharge  Transaminitis Hyperbilirubinemia  - Mild elevation in AST, normal ALT, elevated ALP - T. Bili improving - Possibly related to cholecystitis  HLD - Statin held due to transaminits  DVT prophylaxis: heparin  injection 5,000 Units Start: 03/01/24 2215   Code Status:   Code Status: Full Code  Family Communication: None at bedside  Disposition Plan: Pending clinical improvement, AKI, cholecystitis PT - Follow Up Recommendations: No PT follow up - PT equipment: None recommended by PT OT -   -    Level of care: Progressive  Consultants:  Nephrology, general surgery, IR  Procedures:  None  Antimicrobials: Zosyn    Subjective: Examined at bedside. N/V almost resolved, diarrhea improving.   Objective: Vitals:   03/02/24 0205 03/02/24 0415 03/02/24 0600 03/02/24  0830  BP:  (!) 108/52    Pulse:  89 89   Resp: 18 19    Temp: 98 F (36.7 C) 97.7 F (36.5 C)  (!) 97.5 F (36.4 C)  TempSrc: Oral Oral  Oral  SpO2:  91% 92%   Weight:      Height:         Intake/Output Summary (Last 24 hours) at 03/02/2024 0854 Last data filed at 03/02/2024 0644 Gross per 24 hour  Intake 1388.01 ml  Output 20 ml  Net 1368.01 ml   Filed Weights   03/01/24 1505 03/01/24 2109  Weight: 88.3 kg 86.7 kg    Examination:  Gen: NAD, A&Ox3 HEENT: NCAT, EOMI Neck: Supple, no JVD CV: RRR, no murmurs Resp: normal WOB, CTAB, no w/r/r Abd: Soft, generalized tenderness on exam with definite RUQ tenderness on deep palpation, no guarding, BS normoactive Ext: No LE edema Skin: Warm, dry, no rashes/lesions Neuro: CN II-XII grossly intact, strength 5/5 b/l, sensation intact Psych: Calm, cooperative, appropriate affect    Data Reviewed: I have personally reviewed following labs and imaging studies  CBC: Recent Labs  Lab 03/01/24 1507 03/01/24 2223 03/02/24 0252  WBC 18.3* 19.8* 19.3*  HGB 14.8 14.8 14.4  HCT 41.6 43.2 41.6  MCV 83.9 85.9 85.1  PLT 83* 86* 81*   Basic Metabolic Panel: Recent Labs  Lab 03/01/24 1507 03/01/24 1508 03/01/24 2223 03/02/24 0252  NA 135  --   --  135  K 2.9*  --   --  3.4*  CL 95*  --   --  102  CO2 25  --   --  16*  GLUCOSE 105*  --   --  87  BUN 70*  --   --  74*  CREATININE 6.00*  --  5.87* 5.97*  CALCIUM  8.7*  --   --  8.0*  MG  --  2.5*  --  2.2   GFR: Estimated Creatinine Clearance: 11.9 mL/min (A) (by C-G formula based on SCr of 5.97 mg/dL (H)). Liver Function Tests: Recent Labs  Lab 03/01/24 1507 03/02/24 0252  AST 57* 61*  ALT 40 39  ALKPHOS 148* 142*  BILITOT 2.5* 2.1*  PROT 6.4* 5.9*  ALBUMIN 3.2* 2.6*   Recent Labs  Lab 03/01/24 1507  LIPASE 97*   No results for input(s): AMMONIA in the last 168 hours. Coagulation Profile: No results for input(s): INR, PROTIME in the last 168 hours. Cardiac Enzymes: No results for input(s): CKTOTAL, CKMB, CKMBINDEX, TROPONINI in the last 168 hours. BNP (last 3 results) No results for input(s): PROBNP in the last 8760  hours. HbA1C: No results for input(s): HGBA1C in the last 72 hours. CBG: No results for input(s): GLUCAP in the last 168 hours. Lipid Profile: No results for input(s): CHOL, HDL, LDLCALC, TRIG, CHOLHDL, LDLDIRECT in the last 72 hours. Thyroid  Function Tests: No results for input(s): TSH, T4TOTAL, FREET4, T3FREE, THYROIDAB in the last 72 hours. Anemia Panel: No results for input(s): VITAMINB12, FOLATE, FERRITIN, TIBC, IRON, RETICCTPCT in the last 72 hours. Sepsis Labs: Recent Labs  Lab 03/01/24 1741 03/01/24 1940  LATICACIDVEN 1.5 1.3    Recent Results (from the past 240 hours)  Resp panel by RT-PCR (RSV, Flu A&B, Covid) Anterior Nasal Swab     Status: None   Collection Time: 03/01/24  3:08 PM   Specimen: Anterior Nasal Swab  Result Value Ref Range Status   SARS Coronavirus 2 by RT PCR NEGATIVE NEGATIVE Final  Comment: (NOTE) SARS-CoV-2 target nucleic acids are NOT DETECTED.  The SARS-CoV-2 RNA is generally detectable in upper respiratory specimens during the acute phase of infection. The lowest concentration of SARS-CoV-2 viral copies this assay can detect is 138 copies/mL. A negative result does not preclude SARS-Cov-2 infection and should not be used as the sole basis for treatment or other patient management decisions. A negative result may occur with  improper specimen collection/handling, submission of specimen other than nasopharyngeal swab, presence of viral mutation(s) within the areas targeted by this assay, and inadequate number of viral copies(<138 copies/mL). A negative result must be combined with clinical observations, patient history, and epidemiological information. The expected result is Negative.  Fact Sheet for Patients:  bloggercourse.com  Fact Sheet for Healthcare Providers:  seriousbroker.it  This test is no t yet approved or cleared by the [place]  FDA  and  has been authorized for detection and/or diagnosis of SARS-CoV-2 by FDA under an Emergency Use Authorization (EUA). This EUA will remain  in effect (meaning this test can be used) for the duration of the COVID-19 declaration under Section 564(b)(1) of the Act, 21 U.S.C.section 360bbb-3(b)(1), unless the authorization is terminated  or revoked sooner.       Influenza A by PCR NEGATIVE NEGATIVE Final   Influenza B by PCR NEGATIVE NEGATIVE Final    Comment: (NOTE) The Xpert Xpress SARS-CoV-2/FLU/RSV plus assay is intended as an aid in the diagnosis of influenza from Nasopharyngeal swab specimens and should not be used as a sole basis for treatment. Nasal washings and aspirates are unacceptable for Xpert Xpress SARS-CoV-2/FLU/RSV testing.  Fact Sheet for Patients: bloggercourse.com  Fact Sheet for Healthcare Providers: seriousbroker.it  This test is not yet approved or cleared by the [place]  FDA and has been authorized for detection and/or diagnosis of SARS-CoV-2 by FDA under an Emergency Use Authorization (EUA). This EUA will remain in effect (meaning this test can be used) for the duration of the COVID-19 declaration under Section 564(b)(1) of the Act, 21 U.S.C. section 360bbb-3(b)(1), unless the authorization is terminated or revoked.     Resp Syncytial Virus by PCR NEGATIVE NEGATIVE Final    Comment: (NOTE) Fact Sheet for Patients: bloggercourse.com  Fact Sheet for Healthcare Providers: seriousbroker.it  This test is not yet approved or cleared by the [place]  FDA and has been authorized for detection and/or diagnosis of SARS-CoV-2 by FDA under an Emergency Use Authorization (EUA). This EUA will remain in effect (meaning this test can be used) for the duration of the COVID-19 declaration under Section 564(b)(1) of the Act, 21 U.S.C. section 360bbb-3(b)(1),  unless the authorization is terminated or revoked.  Performed at [facility], [address] [place], [place] [address]   Blood culture (routine x 2)     Status: None (Preliminary result)   Collection Time: 03/01/24  5:25 PM   Specimen: BLOOD  Result Value Ref Range Status   Specimen Description   Final    BLOOD RIGHT ANTECUBITAL Performed at [facility], [address] [place], [place] [address]    Special Requests   Final    BOTTLES DRAWN AEROBIC AND ANAEROBIC Blood Culture adequate volume Performed at [facility], [address] [place], [place] [address]    Culture   Final    NO GROWTH < 12 HOURS Performed at [facility], [address] [place], [place] [address]    Report Status PENDING  Incomplete  Blood culture (routine x 2)     Status: None (Preliminary result)   Collection Time: 03/01/24  5:41 PM   Specimen: BLOOD RIGHT FOREARM  Result Value Ref Range Status   Specimen Description   Final    BLOOD RIGHT FOREARM Performed at [facility], [address] [place], [place] [address]    Special Requests   Final    BOTTLES DRAWN AEROBIC AND ANAEROBIC Blood Culture adequate volume Performed at [facility], [address] [place], [place] [address]    Culture   Final    NO GROWTH < 12 HOURS Performed at [facility], [address], [place], [place] [address]    Report Status PENDING  Incomplete  C Difficile Quick Screen w PCR reflex     Status: None   Collection Time: 03/01/24 10:05 PM   Specimen: Stool  Result Value Ref Range Status   C Diff antigen NEGATIVE NEGATIVE Final   C Diff toxin NEGATIVE NEGATIVE Final   C Diff interpretation No C. difficile detected.  Final    Comment: Performed at [facility], [address] [place], [place] [address]     Radiology Studies: CT ABDOMEN PELVIS WO CONTRAST Result Date: 03/01/2024 CLINICAL DATA:  Abdominal pain, acute, nonlocalized R  abd pain, renal failure. EXAM: CT ABDOMEN AND PELVIS WITHOUT CONTRAST TECHNIQUE: Multidetector CT imaging of the abdomen and pelvis was performed following the standard protocol without IV contrast. RADIATION DOSE REDUCTION: This exam was performed according to the departmental dose-optimization program which includes automated exposure control, adjustment of the mA and/or kV according to patient size and/or use of iterative reconstruction technique. COMPARISON:  None Available. FINDINGS: Lower chest: There is ground-glass opacity in the right lower lobe, concerning for pneumonitis. There also associated linear areas of atelectasis/scarring in the right lower lobe. No pleural effusion. Mildly enlarged heart size. No pericardial effusion. There are coronary artery atherosclerotic calcifications, in keeping with coronary artery disease. Hepatobiliary: The liver is normal in size. Non-cirrhotic configuration. No suspicious mass. No intrahepatic or extrahepatic bile duct dilation. No calcified choledocholithiasis. The gallbladder is distended measuring up to 5.4 cm in width and exhibit mild-to-moderate circumferential wall thickening and moderate pericholecystic fat stranding. There are several subcentimeter sized faintly calcified gallstones. There are few prominent pericholecystic lymph nodes. Findings are compatible with acute cholecystitis. Pancreas: Unremarkable. No pancreatic ductal dilatation or surrounding inflammatory changes. Spleen: Within normal limits. No focal lesion. Adrenals/Urinary Tract: Unremarkable left adrenal gland. 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 Asymmetrically moderately atrophic right kidney noted. There is right extrarenal pelvis. There is mild fullness in the right renal collecting system. No right nephroureterolithiasis. There is a partially exophytic simple cyst arising from the left kidney interpolar region, posteriorly measuring 4.8 x 6.4 cm. There is a partially exophytic  2.5 x 2.7 cm soft tissue mass arising from the left kidney upper pole, which cannot be characterized as a simple cyst on this exam. There is indeterminate. Further evaluation with multiphasic contrast-enhanced MRI abdomen as per renal mass protocol is recommended. No left nephroureterolithiasis or obstructive uropathy. Stomach/Bowel: There is a small sliding hiatal hernia. There is a diverticulum arising from the second part of duodenum. No disproportionate dilation of the small or large bowel loops. Unremarkable appendix. 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 There is mild asymmetric thickening of the hepatic flexure of colon which is likely secondary to inflammation from acute cholecystitis. Vascular/Lymphatic: No  ascites or pneumoperitoneum. No abdominal or pelvic lymphadenopathy, by size criteria. No aneurysmal dilation of the major abdominal arteries. There are moderate peripheral atherosclerotic vascular calcifications of the aorta and its major branches. Reproductive: Normal size prostate. Symmetric seminal vesicles. Other: There is a small fat containing umbilical hernia and bilateral small fat containing inguinal hernias. The soft tissues and abdominal wall are otherwise unremarkable. Musculoskeletal: No suspicious osseous lesions. There are mild - moderate multilevel degenerative changes in the visualized spine. IMPRESSION: 1. Findings compatible with acute cholecystitis. No calcified choledocholithiasis. 2. There is a 2.5 x 2.7 cm soft tissue mass arising from the left kidney upper pole, which cannot be characterized as a simple cyst on this exam. Further evaluation with multiphasic contrast-enhanced MRI abdomen as per renal mass protocol is recommended. 3. 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 4. Ground-glass opacity in the right lower lobe, concerning for pneumonitis. Correlate clinically. 5. Multiple other nonacute observations, as described above. Aortic  Atherosclerosis (ICD10-I70.0). Electronically Signed   By: Ree Molt M.D.   On: 03/01/2024 17:22    Scheduled Meds:  heparin   5,000 Units Subcutaneous Q8H   Continuous Infusions:  sodium chloride  150 mL/hr at 03/02/24 0411   piperacillin -tazobactam (ZOSYN )  IV       Unresulted Labs (From admission, onward)     Start     Ordered   03/01/24 1856  Gastrointestinal Panel by PCR , Stool  (Gastrointestinal Panel by PCR, Stool                                                                                                                                                     **Does Not include CLOSTRIDIUM DIFFICILE testing. **If CDIFF testing is needed, place order from the C Difficile Testing order set.**)  Once,   R        03/01/24 1856             LOS:  LOS: 1 day   Time Spent: 45 minutes  Demarion Pondexter Al-Sultani, MD [facility]  If 7PM-7AM, please contact night-coverage  03/02/2024, 8:54 AM      "

## 2024-03-02 NOTE — Progress Notes (Signed)
 OT Cancellation Note - OT Screen  Patient Details Name: [name] MRN: [id number] DOB: 05-18-1945   Cancelled Treatment:    Reason Eval/Treat Not Completed: OT screened, no needs identified, will sign off (OT screen completed. Pt currently Independent with ADLs and functional mobility without an AD. No skilled OT or equipment needs identified at this time. Acute OT signing off.)  Margarie Rockey HERO., OTR/L, MA Acute Rehab [phone]   [name] 03/02/2024, 2:25 PM

## 2024-03-03 ENCOUNTER — Inpatient Hospital Stay (HOSPITAL_COMMUNITY)

## 2024-03-03 ENCOUNTER — Encounter (HOSPITAL_COMMUNITY): Payer: Self-pay | Admitting: Internal Medicine

## 2024-03-03 DIAGNOSIS — I4729 Other ventricular tachycardia: Secondary | ICD-10-CM | POA: Diagnosis not present

## 2024-03-03 DIAGNOSIS — A419 Sepsis, unspecified organism: Secondary | ICD-10-CM | POA: Diagnosis not present

## 2024-03-03 DIAGNOSIS — R6520 Severe sepsis without septic shock: Secondary | ICD-10-CM | POA: Diagnosis not present

## 2024-03-03 DIAGNOSIS — K810 Acute cholecystitis: Secondary | ICD-10-CM | POA: Diagnosis not present

## 2024-03-03 DIAGNOSIS — I493 Ventricular premature depolarization: Secondary | ICD-10-CM

## 2024-03-03 DIAGNOSIS — D696 Thrombocytopenia, unspecified: Secondary | ICD-10-CM

## 2024-03-03 DIAGNOSIS — N179 Acute kidney failure, unspecified: Secondary | ICD-10-CM | POA: Diagnosis not present

## 2024-03-03 HISTORY — PX: IR PERC CHOLECYSTOSTOMY: IMG2326

## 2024-03-03 LAB — CBC
HCT: 42 % (ref 39.0–52.0)
Hemoglobin: 14.4 g/dL (ref 13.0–17.0)
MCH: 29.2 pg (ref 26.0–34.0)
MCHC: 34.3 g/dL (ref 30.0–36.0)
MCV: 85.2 fL (ref 80.0–100.0)
Platelets: 92 K/uL — ABNORMAL LOW (ref 150–400)
RBC: 4.93 MIL/uL (ref 4.22–5.81)
RDW: 14.7 % (ref 11.5–15.5)
WBC: 16.7 K/uL — ABNORMAL HIGH (ref 4.0–10.5)
nRBC: 0 % (ref 0.0–0.2)

## 2024-03-03 LAB — RENAL FUNCTION PANEL
Albumin: 2.8 g/dL — ABNORMAL LOW (ref 3.5–5.0)
Anion gap: 16 — ABNORMAL HIGH (ref 5–15)
BUN: 79 mg/dL — ABNORMAL HIGH (ref 8–23)
CO2: 18 mmol/L — ABNORMAL LOW (ref 22–32)
Calcium: 8.4 mg/dL — ABNORMAL LOW (ref 8.9–10.3)
Chloride: 101 mmol/L (ref 98–111)
Creatinine, Ser: 6.9 mg/dL — ABNORMAL HIGH (ref 0.61–1.24)
GFR, Estimated: 8 mL/min — ABNORMAL LOW
Glucose, Bld: 94 mg/dL (ref 70–99)
Phosphorus: 3.3 mg/dL (ref 2.5–4.6)
Potassium: 3.6 mmol/L (ref 3.5–5.1)
Sodium: 135 mmol/L (ref 135–145)

## 2024-03-03 LAB — HEPATIC FUNCTION PANEL
ALT: 34 U/L (ref 0–44)
AST: 45 U/L — ABNORMAL HIGH (ref 15–41)
Albumin: 2.8 g/dL — ABNORMAL LOW (ref 3.5–5.0)
Alkaline Phosphatase: 139 U/L — ABNORMAL HIGH (ref 38–126)
Bilirubin, Direct: 1.3 mg/dL — ABNORMAL HIGH (ref 0.0–0.2)
Indirect Bilirubin: 0.7 mg/dL (ref 0.3–0.9)
Total Bilirubin: 2.1 mg/dL — ABNORMAL HIGH (ref 0.0–1.2)
Total Protein: 6.3 g/dL — ABNORMAL LOW (ref 6.5–8.1)

## 2024-03-03 LAB — MICROALBUMIN / CREATININE URINE RATIO
Creatinine, Urine: 250.4 mg/dL
Microalb Creat Ratio: 341 mg/g{creat} — ABNORMAL HIGH (ref 0–29)
Microalb, Ur: 854.8 ug/mL — ABNORMAL HIGH

## 2024-03-03 LAB — TSH: TSH: 3.71 u[IU]/mL (ref 0.350–4.500)

## 2024-03-03 LAB — MAGNESIUM: Magnesium: 2 mg/dL (ref 1.7–2.4)

## 2024-03-03 MED ORDER — POTASSIUM CHLORIDE CRYS ER 20 MEQ PO TBCR
40.0000 meq | EXTENDED_RELEASE_TABLET | Freq: Once | ORAL | Status: AC
  Administered 2024-03-03: 40 meq via ORAL
  Filled 2024-03-03: qty 2

## 2024-03-03 MED ORDER — FENTANYL CITRATE (PF) 100 MCG/2ML IJ SOLN
INTRAMUSCULAR | Status: AC
  Filled 2024-03-03: qty 2

## 2024-03-03 MED ORDER — OXYCODONE HCL 5 MG PO TABS
5.0000 mg | ORAL_TABLET | ORAL | Status: DC | PRN
  Administered 2024-03-03: 5 mg via ORAL
  Filled 2024-03-03: qty 1

## 2024-03-03 MED ORDER — HYDROMORPHONE HCL 1 MG/ML IJ SOLN
0.5000 mg | INTRAMUSCULAR | Status: DC | PRN
  Administered 2024-03-03: 0.5 mg via INTRAVENOUS
  Filled 2024-03-03: qty 1

## 2024-03-03 MED ORDER — LIDOCAINE 5 % EX PTCH
1.0000 | MEDICATED_PATCH | CUTANEOUS | Status: DC
  Administered 2024-03-03 – 2024-03-07 (×5): 1 via TRANSDERMAL
  Filled 2024-03-03 (×4): qty 1

## 2024-03-03 MED ORDER — SODIUM CHLORIDE 0.9% FLUSH
5.0000 mL | Freq: Three times a day (TID) | INTRAVENOUS | Status: DC
  Administered 2024-03-03 – 2024-03-16 (×39): 5 mL

## 2024-03-03 MED ORDER — LOPERAMIDE HCL 2 MG PO CAPS
2.0000 mg | ORAL_CAPSULE | ORAL | Status: DC | PRN
  Administered 2024-03-15: 2 mg via ORAL

## 2024-03-03 MED ORDER — HYDROMORPHONE HCL 1 MG/ML IJ SOLN: 0.5000 mg | Freq: Three times a day (TID) | INTRAMUSCULAR | Status: DC | PRN

## 2024-03-03 MED ORDER — FENTANYL CITRATE (PF) 100 MCG/2ML IJ SOLN
INTRAMUSCULAR | Status: AC | PRN
  Administered 2024-03-03: 50 ug via INTRAVENOUS

## 2024-03-03 MED ORDER — IOHEXOL 300 MG/ML  SOLN: 50.0000 mL | Freq: Once | INTRAMUSCULAR | Status: DC | PRN

## 2024-03-03 MED ORDER — LACTATED RINGERS IV SOLN: INTRAVENOUS | Status: DC

## 2024-03-03 MED ORDER — LIDOCAINE HCL 1 % IJ SOLN
INTRAMUSCULAR | Status: AC
  Filled 2024-03-03: qty 20

## 2024-03-03 MED ORDER — MIDAZOLAM HCL 2 MG/2ML IJ SOLN
INTRAMUSCULAR | Status: AC
  Filled 2024-03-03: qty 2

## 2024-03-03 MED ORDER — LACTATED RINGERS IV BOLUS
500.0000 mL | Freq: Once | INTRAVENOUS | Status: AC
  Administered 2024-03-03: 500 mL via INTRAVENOUS

## 2024-03-03 MED ORDER — OXYCODONE HCL 5 MG PO TABS: 2.5000 mg | ORAL_TABLET | Freq: Three times a day (TID) | ORAL | Status: DC | PRN

## 2024-03-03 MED ORDER — LOPERAMIDE HCL 2 MG PO CAPS
4.0000 mg | ORAL_CAPSULE | Freq: Once | ORAL | Status: AC
  Administered 2024-03-03: 4 mg via ORAL
  Filled 2024-03-03: qty 2

## 2024-03-03 MED ORDER — MIDAZOLAM HCL (PF) 2 MG/2ML IJ SOLN
INTRAMUSCULAR | Status: AC | PRN
  Administered 2024-03-03: 1 mg via INTRAVENOUS

## 2024-03-03 NOTE — Procedures (Signed)
 Interventional Radiology Procedure Note  Procedure: US  AND FLUORO CHOLECYSTOSTOMY    Complications: None  Estimated Blood Loss:  MIN  Findings: BILE ASP CX SENT    Steven FREDERIC SPECKING, MD

## 2024-03-03 NOTE — Consult Note (Signed)
 PCCM Consult Note Asked to see for intermittent hypotension  Brielfy, [phone] with PMH duodenal ulcer, GERD, HLD, HTN, RBBB, CKD IIIb who presented on 12/19 with N/V/D found to have acute cholecystitis, gastroenteritis. He had Perc chole drain placed by surgery on 12/10. Since admission has has significantly increased sCr from baseline 1.3 now 6.9. continues to have diarrhea and had episode of vomiting right before my exam.  He is currently denying pain.  Exam Older appearing male, laying in bed no distress  Alert, oriented x3, non focal exam  On room air, lungs are clear bilaterally  Abdomen rounded, perc chole drain, no tenderness on exam  No LE edema, extremities warm  S1s2, no murmur  Labs reviewed WBC down trending today 19>16  Plat 81>92 BUN 79, sCr 6.9  Tbili stable at 2.1   Afebrile today  Vitals reviewed and earlier this afternoon had intermittent BP with SBP 80. This appears to be over about a 1 hr period. Since has been stable.  HR has come down from low 100s to 90s.  States he is urinating. More today that previous days  Received 500cc bolus at 1300 this afternoon  LR @ 100hr  Plan - no ICU need at this time  - would continue IVF for renal recovering and ongoing repletion of losses through vomiting and diarrhea  - con't antibiotics   - please reconsult if needed    Tinnie FORBES Furth, PA-C [facility] 03/03/2024 8:31 PM  Please see [email or web address] for pager details.  From 7A-7P if no response, please call [phone] After hours, please call ELink [phone]

## 2024-03-03 NOTE — Consult Note (Addendum)
 "   Cardiology Consultation   Patient ID: JAQUAVION MCCANNON MRN: [id number]; DOB: 03/21/1945  Admit date: 03/01/2024 Date of Consult: 03/03/2024  PCP:  Kennyth Worth HERO, MD   [facility] Providers Cardiologist:  Shelda Bruckner, MD       Chief complaint: Nausea, vomiting, diarrhea Reason of consult: Nonsustained ventricular tachycardia Requesting physician: Al-Sultani, Anmar, MD   Patient Profile:   [name] is a 78 y.o. male with a hx of hypertension, hyperlipidemia, PVCs who is being seen 03/03/2024 for the evaluation of nonsustained ventricular tachycardia at the request of Al-Sultani, Anmar, MD.  History of Present Illness:   Mr. Boesen presented to the hospital on 03/01/2024 due to multiple complaints such as nausea, vomiting, diarrhea.  Patient did not feel well since 02/25/2024 after church and having a frozen [employer] meal.  Patient reported loose stools, watery without any mucus or blood and decreased urine output.  Patient was noted to have AKI, electrolyte abnormalities, and imaging findings concerning for acute cholecystitis.  Patient is being treated for sepsis secondary to gastroenteritis and cholecystitis.  Surgery was consulted and felt that percutaneous drain would be ideal given his underlying sepsis, multiple electrolyte abnormalities, acute kidney injury, and thrombocytopenia.  Today cardiology consulted for NSVT management due to recurrent CMD calls to attending physician.  Attending physician notes that clinically he is asymptomatic.  Known history of PVCs.  Had a cardiac monitor in past as well as an echocardiogram.  He denies anginal chest pain or heart failure symptoms.  Patient is resting in bed comfortably. Complains of abdominal right upper quadrant.  No anginal chest pain or heart failure symptoms reviewed.  Patient also denies patient's pillow.  As well as a repeat has been metoprolol  and   Past Medical History:  Diagnosis  Date   Arthritis    Cancer (HCC)    skin   COLONIC POLYPS, HX OF 09/13/2007   Qualifier: Diagnosis of  By: Tish MD, Elsie   Dr Oliva Boots, GI Colonoscopy @ 5 year intervals     DUODENAL ULCER, HX OF 09/13/2007   Qualifier: Diagnosis of  By: Tish MD, Elsie     GERD (gastroesophageal reflux disease)    Hyperlipidemia    Hypertension    Right bundle branch block 02/21/2022   SKIN CANCER, HX OF 09/01/2009   Qualifier: Diagnosis of  By: Tish MD, Elsie [place] [facility]  X 3-4; [place]  [facility]     Ulcer 1989 & 2000   bleedng X2    Past Surgical History:  Procedure Laterality Date   colonoscopy with polypectomy     Dr Boots   EYE SURGERY     post trauma with glss fragments   INGUINAL HERNIA REPAIR  1970   IR PERC CHOLECYSTOSTOMY  03/03/2024   PARATHYROIDECTOMY Right 03/21/2022   Procedure: RIGHT INFERIOR PARATHYROIDECTOMY;  Surgeon: Eletha Boas, MD;  Location: [facility];  Service: General;  Laterality: Right;   pilonidal cystectomy     TOTAL HIP ARTHROPLASTY Right 12/06/2019   Procedure: RIGHT TOTAL HIP ARTHROPLASTY ANTERIOR APPROACH;  Surgeon: Vernetta Bruckner GRADE, MD;  Location: [facility];  Service: Orthopedics;  Laterality: Right;     Home Medications:  Prior to Admission medications  Medication Sig Start Date End Date Taking? Authorizing Provider  atorvastatin  (LIPITOR) 40 MG tablet TAKE 1 TABLET BY MOUTH EVERY DAY 11/08/23  Yes Kennyth Worth HERO, MD  losartan  (COZAAR ) 50 MG tablet Take 1 tablet (50 mg total) by mouth  daily. 02/02/24  Yes Kennyth Worth HERO, MD  metoprolol  succinate (TOPROL -XL) 25 MG 24 hr tablet Take 1 tablet (25 mg total) by mouth daily. Patient taking differently: Take 12.5 mg by mouth daily. 02/02/24  Yes Kennyth Worth HERO, MD  tadalafil  (CIALIS ) 20 MG tablet Take 0.5-1 tablets (10-20 mg total) by mouth daily as needed for erectile dysfunction. 11/09/23   Kennyth Worth HERO, MD    Inpatient Medications: Scheduled Meds:  heparin   5,000 Units Subcutaneous Q8H    sodium chloride  flush  5 mL Intracatheter Q8H   Continuous Infusions:  piperacillin -tazobactam (ZOSYN )  IV 3.375 g (03/03/24 0927)   PRN Meds: acetaminophen  **OR** acetaminophen , albuterol , iohexol , ondansetron  (ZOFRAN ) IV, oxyCODONE   Allergies:   Allergies[1]  Social History:   Social History   Socioeconomic History   Marital status: Married    Spouse name: Not on file   Number of children: 2   Years of education: 16   Highest education level: Associate degree: occupational, scientist, product/process development, or vocational program  Occupational History   Occupation: Retired   Tobacco Use   Smoking status: Former   Smokeless tobacco: Former    Types: Associate Professor status: Never Used  Substance and Sexual Activity   Alcohol use: No   Drug use: No   Sexual activity: Not on file  Other Topics Concern   Not on file  Social History Narrative   Not on file   Social Drivers of Health   Tobacco Use: Medium Risk (03/02/2024)   Patient History    Smoking Tobacco Use: Former    Smokeless Tobacco Use: Former    Passive Exposure: Not on Actuary Strain: Low Risk (12/22/2023)   Overall Financial Resource Strain (CARDIA)    Difficulty of Paying Living Expenses: Not hard at all  Food Insecurity: No Food Insecurity (03/01/2024)   Epic    Worried About Programme Researcher, Broadcasting/film/video in the Last Year: Never true    Ran Out of Food in the Last Year: Never true  Transportation Needs: No Transportation Needs (03/01/2024)   Epic    Lack of Transportation (Medical): No    Lack of Transportation (Non-Medical): No  Physical Activity: Insufficiently Active (12/22/2023)   Exercise Vital Sign    Days of Exercise per Week: 2 days    Minutes of Exercise per Session: 20 min  Stress: No Stress Concern Present (12/22/2023)   [employer] of Occupational Health - Occupational Stress Questionnaire    Feeling of Stress: Not at all  Social Connections: Socially Integrated (03/01/2024)   Social  Connection and Isolation Panel    Frequency of Communication with Friends and Family: More than three times a week    Frequency of Social Gatherings with Friends and Family: Three times a week    Attends Religious Services: More than 4 times per year    Active Member of Clubs or Organizations: Yes    Attends Banker Meetings: More than 4 times per year    Marital Status: Married  Catering Manager Violence: Not At Risk (03/01/2024)   Epic    Fear of Current or Ex-Partner: No    Emotionally Abused: No    Physically Abused: No    Sexually Abused: No  Depression (PHQ2-9): Low Risk (12/26/2023)   Depression (PHQ2-9)    PHQ-2 Score: 0  Alcohol Screen: Not on file  Housing: Low Risk (03/01/2024)   Epic    Unable to Pay for Housing in the  Last Year: No    Number of Times Moved in the Last Year: 0    Homeless in the Last Year: No  Utilities: Not At Risk (03/01/2024)   Epic    Threatened with loss of utilities: No  Health Literacy: Not on file    Family History:   Family History  Problem Relation Age of Onset   Stroke Mother        late 46s   Diabetes Brother        Type II   Hypertension Brother    Hyperlipidemia Brother    Heart disease Neg Hx    Cancer Neg Hx      ROS:  Review of Systems  Cardiovascular:  Negative for chest pain, claudication, irregular heartbeat, leg swelling, near-syncope, orthopnea, palpitations, paroxysmal nocturnal dyspnea and syncope.  Respiratory:  Positive for shortness of breath.   Hematologic/Lymphatic: Negative for bleeding problem.  Gastrointestinal:  Positive for abdominal pain.     Physical Exam/Data:   Vitals:   03/03/24 0755 03/03/24 0820 03/03/24 0825 03/03/24 0830  BP: 123/83 121/81 104/60 (!) 91/59  Pulse: (!) 106 (!) 41 (!) 40 80  Resp: (!) 22 (!) 29 (!) 22 (!) 21  Temp:      TempSrc:      SpO2: 99% 92% 90% (!) 88%  Weight:      Height:        Intake/Output Summary (Last 24 hours) at 03/03/2024 1050 Last data  filed at 03/03/2024 0918 Gross per 24 hour  Intake 2460.46 ml  Output 375 ml  Net 2085.46 ml      03/01/2024    9:09 PM 03/01/2024    3:05 PM 12/26/2023    1:27 PM  Last 3 Weights  Weight (lbs) 191 lb 1.6 oz 194 lb 10.7 oz 194 lb 9.6 oz  Weight (kg) 86.682 kg 88.3 kg 88.27 kg     Body mass index is 25.21 kg/m.   Physical Exam  Constitutional: No distress.  hemodynamically stable  Neck: No JVD present.  Cardiovascular: Normal rate, regular rhythm, S1 normal and S2 normal. Exam reveals no gallop, no S3 and no S4.  No murmur heard. Pulmonary/Chest: Effort normal and breath sounds normal. No stridor. He has no wheezes. He has no rales.  Abdominal: There is abdominal tenderness.  RUQ drain present  Musculoskeletal:        General: No edema.     Cervical back: Neck supple.  Skin: Skin is warm.    EKG:  The EKG was personally reviewed and demonstrates:   03/01/2024: Sinus tachycardia, 110 bpm, frequent ventricular ectopy either as isolated beats or short NSVT episodes, right bundle branch block, nonspecific ST-T changes  Telemetry:  Telemetry was personally reviewed and demonstrates:  SINUS RHYTHM with frequent PVCs and NSVT  Relevant CV Studies: Echocardiogram January 2024: LVEF 60 to 65%, moderate LVH, grade 1 diastolic dysfunction, mild to moderate MR, borderline dilatation of the aortic root and ascending aorta at 37 mm  Cardiac monitor December 2023 Average heart rate 77 bpm. PVC burden 25.2%  Laboratory Data:  High Sensitivity Troponin:  No results for input(s): TROPONINIHS in the last 720 hours.   Chemistry Recent Labs  Lab 03/01/24 1507 03/01/24 1508 03/01/24 2223 03/02/24 0252 03/03/24 0401  NA 135  --   --  135 135  K 2.9*  --   --  3.4* 3.6  CL 95*  --   --  102 101  CO2 25  --   --  16* 18*  GLUCOSE 105*  --   --  87 94  BUN 70*  --   --  74* 79*  CREATININE 6.00*  --  5.87* 5.97* 6.90*  CALCIUM  8.7*  --   --  8.0* 8.4*  MG  --  2.5*  --  2.2  --    GFRNONAA 9*  --  9* 9* 8*  ANIONGAP 15  --   --  17* 16*    Recent Labs  Lab 03/01/24 1507 03/02/24 0252 03/03/24 0401  PROT 6.4* 5.9* 6.3*  ALBUMIN 3.2* 2.6* 2.8*  2.8*  AST 57* 61* 45*  ALT 40 39 34  ALKPHOS 148* 142* 139*  BILITOT 2.5* 2.1* 2.1*   Lipids No results for input(s): CHOL, TRIG, HDL, LABVLDL, LDLCALC, CHOLHDL in the last 168 hours.  Hematology Recent Labs  Lab 03/01/24 2223 03/02/24 0252 03/03/24 0401  WBC 19.8* 19.3* 16.7*  RBC 5.03 4.89 4.93  HGB 14.8 14.4 14.4  HCT 43.2 41.6 42.0  MCV 85.9 85.1 85.2  MCH 29.4 29.4 29.2  MCHC 34.3 34.6 34.3  RDW 14.3 14.3 14.7  PLT 86* 81* 92*   Thyroid  No results for input(s): TSH, FREET4 in the last 168 hours.  BNPNo results for input(s): BNP, PROBNP in the last 168 hours.  DDimer No results for input(s): DDIMER in the last 168 hours.   Radiology/Studies:  IR Perc Cholecystostomy Result Date: 03/03/2024 INDICATION: Acute cholecystitis, non operative candidate EXAM: ULTRASOUND FLUOROSCOPIC PERCUTANEOUS TRANSHEPATIC CHOLECYSTOSTOMY MEDICATIONS: Patient is already receiving IV antibiotics as an inpatient ANESTHESIA/SEDATION: Moderate (conscious) sedation was employed during this procedure. A total of Versed  1.0 mg and Fentanyl  50 mcg was administered intravenously by the radiology nurse. Total intra-service moderate Sedation Time: 10 minutes. The patient's level of consciousness and vital signs were monitored continuously by radiology nursing throughout the procedure under my direct supervision. FLUOROSCOPY: Radiation Exposure Index (as provided by the fluoroscopic device): 1.4 mGy Kerma COMPLICATIONS: None immediate. PROCEDURE: Informed written consent was obtained from the patient after a thorough discussion of the procedural risks, benefits and alternatives. All questions were addressed. Maximal Sterile Barrier Technique was utilized including caps, mask, sterile gowns, sterile gloves, sterile  drape, hand hygiene and skin antiseptic. A timeout was performed prior to the initiation of the procedure. Previous imaging reviewed. Preliminary ultrasound performed. The distended gallbladder was localized in the right upper quadrant. Lower intercostal space marked. Under sterile conditions and local anesthesia, percutaneous needle access performed through a transhepatic window with an 18 gauge 15 cm trocar needle. Images obtained for documentation. There was return of blood tinged bile. Sample sent for culture. Guidewire inserted followed by tract dilatation to insert a 10 French drain. Drain catheter position confirmed with ultrasound and fluoroscopy. Images again obtained for documentation. Gallbladder decompressed by syringe aspiration removing 80 cc. Catheter secured with a Prolene suture and a sterile dressing. Gravity drainage bag connected. No immediate complication. Patient tolerated the procedure well. IMPRESSION: Successful ultrasound and fluoroscopic percutaneous transhepatic cholecystostomy Electronically Signed   By: CHRISTELLA.  Shick M.D.   On: 03/03/2024 09:28   CT ABDOMEN PELVIS WO CONTRAST Result Date: 03/01/2024 CLINICAL DATA:  Abdominal pain, acute, nonlocalized R abd pain, renal failure. EXAM: CT ABDOMEN AND PELVIS WITHOUT CONTRAST TECHNIQUE: Multidetector CT imaging of the abdomen and pelvis was performed following the standard protocol without IV contrast. RADIATION DOSE REDUCTION: This exam was performed according to the departmental dose-optimization program which includes automated exposure control, adjustment of the mA and/or kV according  to patient size and/or use of iterative reconstruction technique. COMPARISON:  None Available. FINDINGS: Lower chest: There is ground-glass opacity in the right lower lobe, concerning for pneumonitis. There also associated linear areas of atelectasis/scarring in the right lower lobe. No pleural effusion. Mildly enlarged heart size. No pericardial effusion.  There are coronary artery atherosclerotic calcifications, in keeping with coronary artery disease. Hepatobiliary: The liver is normal in size. Non-cirrhotic configuration. No suspicious mass. No intrahepatic or extrahepatic bile duct dilation. No calcified choledocholithiasis. The gallbladder is distended measuring up to 5.4 cm in width and exhibit mild-to-moderate circumferential wall thickening and moderate pericholecystic fat stranding. There are several subcentimeter sized faintly calcified gallstones. There are few prominent pericholecystic lymph nodes. Findings are compatible with acute cholecystitis. Pancreas: Unremarkable. No pancreatic ductal dilatation or surrounding inflammatory changes. Spleen: Within normal limits. No focal lesion. Adrenals/Urinary Tract: Unremarkable left adrenal gland. 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 Asymmetrically moderately atrophic right kidney noted. There is right extrarenal pelvis. There is mild fullness in the right renal collecting system. No right nephroureterolithiasis. There is a partially exophytic simple cyst arising from the left kidney interpolar region, posteriorly measuring 4.8 x 6.4 cm. There is a partially exophytic 2.5 x 2.7 cm soft tissue mass arising from the left kidney upper pole, which cannot be characterized as a simple cyst on this exam. There is indeterminate. Further evaluation with multiphasic contrast-enhanced MRI abdomen as per renal mass protocol is recommended. No left nephroureterolithiasis or obstructive uropathy. Stomach/Bowel: There is a small sliding hiatal hernia. There is a diverticulum arising from the second part of duodenum. No disproportionate dilation of the small or large bowel loops. Unremarkable appendix. 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 There is mild asymmetric thickening of the hepatic flexure of colon which is likely secondary to inflammation from acute cholecystitis. Vascular/Lymphatic:  No ascites or pneumoperitoneum. No abdominal or pelvic lymphadenopathy, by size criteria. No aneurysmal dilation of the major abdominal arteries. There are moderate peripheral atherosclerotic vascular calcifications of the aorta and its major branches. Reproductive: Normal size prostate. Symmetric seminal vesicles. Other: There is a small fat containing umbilical hernia and bilateral small fat containing inguinal hernias. The soft tissues and abdominal wall are otherwise unremarkable. Musculoskeletal: No suspicious osseous lesions. There are mild - moderate multilevel degenerative changes in the visualized spine. IMPRESSION: 1. Findings compatible with acute cholecystitis. No calcified choledocholithiasis. 2. There is a 2.5 x 2.7 cm soft tissue mass arising from the left kidney upper pole, which cannot be characterized as a simple cyst on this exam. Further evaluation with multiphasic contrast-enhanced MRI abdomen as per renal mass protocol is recommended. 3. 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 4. Ground-glass opacity in the right lower lobe, concerning for pneumonitis. Correlate clinically. 5. Multiple other nonacute observations, as described above. Aortic Atherosclerosis (ICD10-I70.0). Electronically Signed   By: Ree Molt M.D.   On: 03/01/2024 17:22     Assessment and Plan:  ASSESSMENT:   Nonsustained ventricular tachycardia Premature ventricular contractions Sepsis secondary to gastroenteritis and cholecystitis Acute kidney injury, ? oliguric Acute cholecystitis Hypokalemia Thrombocytopenia Hypertension Hyperlipidemia  PLAN: Cardiology consulted during this hospitalization for evaluation of NSVT noted incidentally on cardiac telemetry  Patient has a known history of elevated PVC burden at baseline and was placed on metoprolol .  However metoprolol  has been held secondary to soft blood pressures.  Clinically he denies anginal chest pain or heart failure symptoms.  EKG and  telemetry dependently reviewed  Echo will be ordered to evaluate for structural heart disease and left ventricular systolic function.  Replace electrolytes, potassium at 4 and magnesium of 2. Check TSH  My concern is that the elevated PVC burden is likely precipitated by his acute infection and worsening renal function and ongoing sepsis.  In an asymptomatic male and AKI ischemic workup is not warranted at this time unless of change in clinical status.  I spoke to the attending physician and recommend that ICU be consulted due to worsening sepsis (hypotension, tachypnea, elevated white count) worsening renal function with a creatinine of 6.9 and a GFR of 8.  Continue telemetry, close monitoring.   Risk Assessment/Risk Scores:        For questions or updates, please contact [facility] Please consult [email or web address] for contact info under     Signed, Madonna Michele HAS, [facility]  A Division of [facility] [address], [place], [place] [address]  Pager: [phone] Office: [phone] 03/03/2024 10:50 AM        [1]  Allergies Allergen Reactions   Advil [Ibuprofen] Other (See Comments)    Hx of bleeding ulcer   Choline Fenofibrate Other (See Comments)    ABDOMINAL PAIN, CONSTIPATION   "

## 2024-03-03 NOTE — Plan of Care (Signed)

## 2024-03-03 NOTE — Progress Notes (Addendum)
 " PROGRESS NOTE    [name]  FMW:[id number] DOB: 1945/06/29 DOA: 03/01/2024 PCP: Kennyth Worth HERO, MD    Brief Narrative:  Patient is a 78 year old male with PMHx of HTN and HLD who presented to the ED on 03/01/2024 with several days of nausea, vomiting, and diarrhea.  Symptoms began on 12/14 after returning from church and eating a frozen [employer] chicken meal, followed about an hour later by chills/subjective fevers with rigors, then profuse emesis and subsequent multiple episodes of loose watery stools.  Emesis progressed from food contents to watery, nonbloody.  Stools watery without blood or mucus.  He reports poor p.o. intake since Sunday and generalized, nonfocal abdominal discomfort.  Also notes markedly decreased urine output over the past 2 days with dark orange urine, without gross hematuria.  In the ED he was found to have AKI and hypokalemia, with CT abdomen findings concerning for acute cholecystitis.  He was admitted to the hospital service for further management.  Assessment and Plan:  AKI on CKD stage IIIa  Oliguria - Likely prerenal in the setting of decreased PO intake and increased GI losses with concomitant Losartan  use. Nephrology suspecting ATN given no obstruction.  - Cr worsening to 6.9, baseline ~ 1.1-1.3 - Currently producing very little urine - Nephrology following, recommended 500 mL LR bolus followed by LR 100 mL/hr - Strict I&O, bladder scan q8h  HAGMA - improving - Bicarb 18, AG corrected for albumin 19.0, delta ratio 1.2 indicating uncomplicated HAGMA - Lactic acid wnl - Likely secondary to acute renal failure - IVF as above - Nephrology following  Sepsis Intermittent hypotension - Met SIRS criteria on admission with tachycardia, tachypnea, leukocytosis, likely secondary to cholecystitis, gastroenteritis and/or acute cystitis - Afebrile, leukocytosis improving - Blood cultures NGTD - Urine culture pending - Continue IV Zosyn  - Continue  IVFs - PCCM consulted given intermittent hypotension in the setting of AKI and sepsis secondary to cholecystitis/gastroenteritis   Acute cholecystitis - Has RUQ pain on palpation with CT evidence of acute cholecystitis without CBD dilation - General surgery consulted, recommending placement of percutaneous cholecystostomy tubes by IR given ongoing AKI and thrombocytopenia with plans for cholecystectomy at a later time - Underwent PCT placement by IR today - Bile culture pending - Continue IV Zosyn   Gastroenteritis - Patient presented with N/V and diarrhea which started after eating frozen meal - N/V resolved but continues to have diarrhea - GIPP and C diff PCR negative - Continue conservative management with IVFs - PRN imodium   PVCs  NSVT - Tele alerted to multiple runs of VT during which patient was asymptomatic and HDS - Patient with known history of high PVC burden. Metoprolol  held due to low blood pressures - Cardiology consulted, determined NSVT with elevated burden of PVC worsened by ongoing acute illness, recommended echo and electrolyte replacement - Echo pending  Possible UTI - UA consistent with UTI - Urine culture pending - On IV Zosyn   Hypokalemia - resolved  Thrombocytopenia - Platelets decreased but relatively stable - No overt signs/symptoms of bleeding - Monitoring  HTN - BP currently low-normal  - Continue holding home antihypertensives  Left renal mass - CT abd/pel showed soft tissue mass on left kidney - Will need MRI with contrast per renal mass protocol at discharge  Transaminitis Hyperbilirubinemia  - Mild elevation in AST, normal ALT, elevated ALP - T. Bili improving - Possibly related to cholecystitis  HLD - Statin held due to transaminits  DVT prophylaxis: heparin  injection 5,000 Units  Start: 03/01/24 2215   Code Status:   Code Status: Full Code  Family Communication: None at bedside  Disposition Plan: Pending clinical improvement,  AKI, cholecystitis PT - Follow Up Recommendations: No PT follow up - PT equipment: None recommended by PT OT -   -    Level of care: Progressive  Consultants:  Nephrology, general surgery, IR  Procedures:  PCT placement by IR - 12/21  Antimicrobials: IV Zosyn    Subjective: Examined at bedside. Underwent PCT placement by IR this morning and is having significant abdominal pain at the site of insertion, 10/10, resulting in him taking shallow breaths.  Continues to have diarrhea.  However nausea and vomiting have resolved.  Objective: Vitals:   03/03/24 0820 03/03/24 0825 03/03/24 0830 03/03/24 1128  BP: 121/81 104/60 (!) 91/59 (!) 140/73  Pulse: (!) 41 (!) 40 80 74  Resp: (!) 29 (!) 22 (!) 21 20  Temp:    (!) 97.4 F (36.3 C)  TempSrc:    Oral  SpO2: 92% 90% (!) 88%   Weight:      Height:        Intake/Output Summary (Last 24 hours) at 03/03/2024 1349 Last data filed at 03/03/2024 1338 Gross per 24 hour  Intake 911.19 ml  Output 360 ml  Net 551.19 ml   Filed Weights   03/01/24 1505 03/01/24 2109  Weight: 88.3 kg 86.7 kg    Examination:  Gen: NAD, A&Ox3 HEENT: NCAT, EOMI Neck: Supple, no JVD CV: tachycardic, regular rhythm, no murmurs Resp: normal WOB though shallow breaths, CTAB, no w/r/r Abd: Soft, RUQ PCT drain in place with dark green contents, abdomen tender, nondistended Ext: No LE edema Skin: Warm, dry, no rashes/lesions Neuro: No focal deficits Psych: Calm, cooperative, appropriate affect   Data Reviewed: I have personally reviewed following labs and imaging studies  CBC: Recent Labs  Lab 03/01/24 1507 03/01/24 2223 03/02/24 0252 03/03/24 0401  WBC 18.3* 19.8* 19.3* 16.7*  HGB 14.8 14.8 14.4 14.4  HCT 41.6 43.2 41.6 42.0  MCV 83.9 85.9 85.1 85.2  PLT 83* 86* 81* 92*   Basic Metabolic Panel: Recent Labs  Lab 03/01/24 1507 03/01/24 1508 03/01/24 2223 03/02/24 0252 03/03/24 0401  NA 135  --   --  135 135  K 2.9*  --   --  3.4* 3.6   CL 95*  --   --  102 101  CO2 25  --   --  16* 18*  GLUCOSE 105*  --   --  87 94  BUN 70*  --   --  74* 79*  CREATININE 6.00*  --  5.87* 5.97* 6.90*  CALCIUM  8.7*  --   --  8.0* 8.4*  MG  --  2.5*  --  2.2  --   PHOS  --   --   --   --  3.3   GFR: Estimated Creatinine Clearance: 10.3 mL/min (A) (by C-G formula based on SCr of 6.9 mg/dL (H)). Liver Function Tests: Recent Labs  Lab 03/01/24 1507 03/02/24 0252 03/03/24 0401  AST 57* 61* 45*  ALT 40 39 34  ALKPHOS 148* 142* 139*  BILITOT 2.5* 2.1* 2.1*  PROT 6.4* 5.9* 6.3*  ALBUMIN 3.2* 2.6* 2.8*  2.8*   Recent Labs  Lab 03/01/24 1507  LIPASE 97*   No results for input(s): AMMONIA in the last 168 hours. Coagulation Profile: Recent Labs  Lab 03/02/24 1338  INR 1.0   Cardiac Enzymes: No results for input(s):  CKTOTAL, CKMB, CKMBINDEX, TROPONINI in the last 168 hours. BNP (last 3 results) No results for input(s): PROBNP in the last 8760 hours. HbA1C: No results for input(s): HGBA1C in the last 72 hours. CBG: No results for input(s): GLUCAP in the last 168 hours. Lipid Profile: No results for input(s): CHOL, HDL, LDLCALC, TRIG, CHOLHDL, LDLDIRECT in the last 72 hours. Thyroid  Function Tests: No results for input(s): TSH, T4TOTAL, FREET4, T3FREE, THYROIDAB in the last 72 hours. Anemia Panel: No results for input(s): VITAMINB12, FOLATE, FERRITIN, TIBC, IRON, RETICCTPCT in the last 72 hours. Sepsis Labs: Recent Labs  Lab 03/01/24 1741 03/01/24 1940  LATICACIDVEN 1.5 1.3    Recent Results (from the past 240 hours)  Resp panel by RT-PCR (RSV, Flu A&B, Covid) Anterior Nasal Swab     Status: None   Collection Time: 03/01/24  3:08 PM   Specimen: Anterior Nasal Swab  Result Value Ref Range Status   SARS Coronavirus 2 by RT PCR NEGATIVE NEGATIVE Final    Comment: (NOTE) SARS-CoV-2 target nucleic acids are NOT DETECTED.  The SARS-CoV-2 RNA is generally detectable in  upper respiratory specimens during the acute phase of infection. The lowest concentration of SARS-CoV-2 viral copies this assay can detect is 138 copies/mL. A negative result does not preclude SARS-Cov-2 infection and should not be used as the sole basis for treatment or other patient management decisions. A negative result may occur with  improper specimen collection/handling, submission of specimen other than nasopharyngeal swab, presence of viral mutation(s) within the areas targeted by this assay, and inadequate number of viral copies(<138 copies/mL). A negative result must be combined with clinical observations, patient history, and epidemiological information. The expected result is Negative.  Fact Sheet for Patients:  bloggercourse.com  Fact Sheet for Healthcare Providers:  seriousbroker.it  This test is no t yet approved or cleared by the [place]  FDA and  has been authorized for detection and/or diagnosis of SARS-CoV-2 by FDA under an Emergency Use Authorization (EUA). This EUA will remain  in effect (meaning this test can be used) for the duration of the COVID-19 declaration under Section 564(b)(1) of the Act, 21 U.S.C.section 360bbb-3(b)(1), unless the authorization is terminated  or revoked sooner.       Influenza A by PCR NEGATIVE NEGATIVE Final   Influenza B by PCR NEGATIVE NEGATIVE Final    Comment: (NOTE) The Xpert Xpress SARS-CoV-2/FLU/RSV plus assay is intended as an aid in the diagnosis of influenza from Nasopharyngeal swab specimens and should not be used as a sole basis for treatment. Nasal washings and aspirates are unacceptable for Xpert Xpress SARS-CoV-2/FLU/RSV testing.  Fact Sheet for Patients: bloggercourse.com  Fact Sheet for Healthcare Providers: seriousbroker.it  This test is not yet approved or cleared by the [place]  FDA and has been  authorized for detection and/or diagnosis of SARS-CoV-2 by FDA under an Emergency Use Authorization (EUA). This EUA will remain in effect (meaning this test can be used) for the duration of the COVID-19 declaration under Section 564(b)(1) of the Act, 21 U.S.C. section 360bbb-3(b)(1), unless the authorization is terminated or revoked.     Resp Syncytial Virus by PCR NEGATIVE NEGATIVE Final    Comment: (NOTE) Fact Sheet for Patients: bloggercourse.com  Fact Sheet for Healthcare Providers: seriousbroker.it  This test is not yet approved or cleared by the [place]  FDA and has been authorized for detection and/or diagnosis of SARS-CoV-2 by FDA under an Emergency Use Authorization (EUA). This EUA will remain in effect (meaning this test can  be used) for the duration of the COVID-19 declaration under Section 564(b)(1) of the Act, 21 U.S.C. section 360bbb-3(b)(1), unless the authorization is terminated or revoked.  Performed at [facility], [address] [place], [place] [address]   Blood culture (routine x 2)     Status: None (Preliminary result)   Collection Time: 03/01/24  5:25 PM   Specimen: BLOOD  Result Value Ref Range Status   Specimen Description   Final    BLOOD RIGHT ANTECUBITAL Performed at [facility], [address] [place], [place] [address]    Special Requests   Final    BOTTLES DRAWN AEROBIC AND ANAEROBIC Blood Culture adequate volume Performed at [facility], [address] [place], [place] [address]    Culture   Final    NO GROWTH 2 DAYS Performed at [facility], [address], [place], [place] [address]    Report Status PENDING  Incomplete  Blood culture (routine x 2)     Status: None (Preliminary result)   Collection Time: 03/01/24  5:41 PM   Specimen: BLOOD RIGHT FOREARM  Result Value Ref Range Status   Specimen Description   Final    BLOOD RIGHT  FOREARM Performed at [facility], [address] [place], [place] [address]    Special Requests   Final    BOTTLES DRAWN AEROBIC AND ANAEROBIC Blood Culture adequate volume Performed at [facility], [address] [place], [place] [address]    Culture   Final    NO GROWTH 2 DAYS Performed at [facility], [address], [place], [place] [address]    Report Status PENDING  Incomplete  Gastrointestinal Panel by PCR , Stool     Status: None   Collection Time: 03/01/24 10:05 PM   Specimen: Stool  Result Value Ref Range Status   Campylobacter species NOT DETECTED NOT DETECTED Final   Plesimonas shigelloides NOT DETECTED NOT DETECTED Final   Salmonella species NOT DETECTED NOT DETECTED Final   Yersinia enterocolitica NOT DETECTED NOT DETECTED Final   Vibrio species NOT DETECTED NOT DETECTED Final   Vibrio cholerae NOT DETECTED NOT DETECTED Final   Enteroaggregative E coli (EAEC) NOT DETECTED NOT DETECTED Final   Enteropathogenic E coli (EPEC) NOT DETECTED NOT DETECTED Final   Enterotoxigenic E coli (ETEC) NOT DETECTED NOT DETECTED Final   Shiga like toxin producing E coli (STEC) NOT DETECTED NOT DETECTED Final   Shigella/Enteroinvasive E coli (EIEC) NOT DETECTED NOT DETECTED Final   Cryptosporidium NOT DETECTED NOT DETECTED Final   Cyclospora cayetanensis NOT DETECTED NOT DETECTED Final   Entamoeba histolytica NOT DETECTED NOT DETECTED Final   Giardia lamblia NOT DETECTED NOT DETECTED Final   Adenovirus F40/41 NOT DETECTED NOT DETECTED Final   Astrovirus NOT DETECTED NOT DETECTED Final   Norovirus GI/GII NOT DETECTED NOT DETECTED Final   Rotavirus A NOT DETECTED NOT DETECTED Final   Sapovirus (I, II, IV, and V) NOT DETECTED NOT DETECTED Final    Comment: Performed at [facility], [address] [place], [place] [address]  C Difficile Quick Screen w PCR reflex     Status: None   Collection Time: 03/01/24 10:05 PM   Specimen: Stool  Result  Value Ref Range Status   C Diff antigen NEGATIVE NEGATIVE Final   C Diff toxin NEGATIVE NEGATIVE Final   C Diff interpretation No C. difficile detected.  Final    Comment: Performed at [facility], [address] [place], [place] [address]     Radiology Studies: IR Perc Cholecystostomy Result Date: 03/03/2024 INDICATION: Acute cholecystitis, non operative candidate EXAM: ULTRASOUND FLUOROSCOPIC PERCUTANEOUS TRANSHEPATIC CHOLECYSTOSTOMY MEDICATIONS: Patient is already receiving IV antibiotics as an inpatient ANESTHESIA/SEDATION: Moderate (conscious) sedation was employed during this procedure. A total of Versed  1.0 mg and Fentanyl  50 mcg was administered intravenously by the radiology nurse. Total intra-service moderate Sedation Time: 10 minutes. The patient's level of consciousness and vital signs were monitored continuously by radiology nursing throughout the procedure under my direct supervision. FLUOROSCOPY: Radiation Exposure Index (as provided by the fluoroscopic device): 1.4 mGy Kerma COMPLICATIONS: None immediate. PROCEDURE: Informed written consent was obtained from the patient after a thorough discussion of the procedural risks, benefits and alternatives. All questions were addressed. Maximal Sterile Barrier Technique was utilized including caps, mask, sterile gowns, sterile gloves, sterile drape, hand hygiene and skin antiseptic. A timeout was performed prior to the initiation of the procedure. Previous imaging reviewed. Preliminary ultrasound performed. The distended gallbladder was localized in the right upper quadrant. Lower intercostal space marked. Under sterile conditions and local anesthesia, percutaneous needle access performed through a transhepatic window with an 18 gauge 15 cm trocar needle. Images obtained for documentation. There was return of blood tinged bile. Sample sent for culture. Guidewire inserted followed by tract dilatation to insert a 10 French drain. Drain  catheter position confirmed with ultrasound and fluoroscopy. Images again obtained for documentation. Gallbladder decompressed by syringe aspiration removing 80 cc. Catheter secured with a Prolene suture and a sterile dressing. Gravity drainage bag connected. No immediate complication. Patient tolerated the procedure well. IMPRESSION: Successful ultrasound and fluoroscopic percutaneous transhepatic cholecystostomy Electronically Signed   By: CHRISTELLA.  Shick M.D.   On: 03/03/2024 09:28   CT ABDOMEN PELVIS WO CONTRAST Result Date: 03/01/2024 CLINICAL DATA:  Abdominal pain, acute, nonlocalized R abd pain, renal failure. EXAM: CT ABDOMEN AND PELVIS WITHOUT CONTRAST TECHNIQUE: Multidetector CT imaging of the abdomen and pelvis was performed following the standard protocol without IV contrast. RADIATION DOSE REDUCTION: This exam was performed according to the departmental dose-optimization program which includes automated exposure control, adjustment of the mA and/or kV according to patient size and/or use of iterative reconstruction technique. COMPARISON:  None Available. FINDINGS: Lower chest: There is ground-glass opacity in the right lower lobe, concerning for pneumonitis. There also associated linear areas of atelectasis/scarring in the right lower lobe. No pleural effusion. Mildly enlarged heart size. No pericardial effusion. There are coronary artery atherosclerotic calcifications, in keeping with coronary artery disease. Hepatobiliary: The liver is normal in size. Non-cirrhotic configuration. No suspicious mass. No intrahepatic or extrahepatic bile duct dilation. No calcified choledocholithiasis. The gallbladder is distended measuring up to 5.4 cm in width and exhibit mild-to-moderate circumferential wall thickening and moderate pericholecystic fat stranding. There are several subcentimeter sized faintly calcified gallstones. There are few prominent pericholecystic lymph nodes. Findings are compatible with acute  cholecystitis. Pancreas: Unremarkable. No pancreatic ductal dilatation or surrounding inflammatory changes. Spleen: Within normal limits. No focal lesion. Adrenals/Urinary Tract: Unremarkable left adrenal gland. 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 Asymmetrically moderately atrophic right kidney noted. There is right extrarenal pelvis. There is mild fullness in the right renal collecting system. No right nephroureterolithiasis. There is a partially exophytic simple cyst arising from the left kidney interpolar region, posteriorly measuring 4.8 x 6.4 cm. There is a partially exophytic 2.5 x 2.7 cm soft tissue  mass arising from the left kidney upper pole, which cannot be characterized as a simple cyst on this exam. There is indeterminate. Further evaluation with multiphasic contrast-enhanced MRI abdomen as per renal mass protocol is recommended. No left nephroureterolithiasis or obstructive uropathy. Stomach/Bowel: There is a small sliding hiatal hernia. There is a diverticulum arising from the second part of duodenum. No disproportionate dilation of the small or large bowel loops. Unremarkable appendix. 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 There is mild asymmetric thickening of the hepatic flexure of colon which is likely secondary to inflammation from acute cholecystitis. Vascular/Lymphatic: No ascites or pneumoperitoneum. No abdominal or pelvic lymphadenopathy, by size criteria. No aneurysmal dilation of the major abdominal arteries. There are moderate peripheral atherosclerotic vascular calcifications of the aorta and its major branches. Reproductive: Normal size prostate. Symmetric seminal vesicles. Other: There is a small fat containing umbilical hernia and bilateral small fat containing inguinal hernias. The soft tissues and abdominal wall are otherwise unremarkable. Musculoskeletal: No suspicious osseous lesions. There are mild - moderate multilevel degenerative changes in  the visualized spine. IMPRESSION: 1. Findings compatible with acute cholecystitis. No calcified choledocholithiasis. 2. There is a 2.5 x 2.7 cm soft tissue mass arising from the left kidney upper pole, which cannot be characterized as a simple cyst on this exam. Further evaluation with multiphasic contrast-enhanced MRI abdomen as per renal mass protocol is recommended. 3. 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 4. Ground-glass opacity in the right lower lobe, concerning for pneumonitis. Correlate clinically. 5. Multiple other nonacute observations, as described above. Aortic Atherosclerosis (ICD10-I70.0). Electronically Signed   By: Ree Molt M.D.   On: 03/01/2024 17:22    Scheduled Meds:  heparin   5,000 Units Subcutaneous Q8H   sodium chloride  flush  5 mL Intracatheter Q8H   Continuous Infusions:  lactated ringers  100 mL/hr at 03/03/24 1253   piperacillin -tazobactam (ZOSYN )  IV 3.375 g (03/03/24 0927)     Unresulted Labs (From admission, onward)     Start     Ordered   03/04/24 0500  CBC  Tomorrow morning,   R       Question:  Specimen collection method  Answer:  Lab=Lab collect   03/03/24 1840   03/04/24 0500  Hepatic function panel  Tomorrow morning,   R       Question:  Specimen collection method  Answer:  Lab=Lab collect   03/03/24 1840   03/04/24 0500  Basic metabolic panel with GFR  Tomorrow morning,   R       Question:  Specimen collection method  Answer:  Lab=Lab collect   03/03/24 1840   03/02/24 2211  Urine Culture (for pregnant, neutropenic or urologic patients or patients with an indwelling urinary catheter)  (Urine Labs)  Add-on,   AD       Question:  Indication  Answer:  Sepsis   03/02/24 2211             LOS:  LOS: 2 days   Time Spent: 45 minutes  Steven Caravello Al-Sultani, MD [facility]  If 7PM-7AM, please contact night-coverage  03/03/2024, 1:49 PM      "

## 2024-03-03 NOTE — Progress Notes (Signed)
 " [facility] Progress Note    Assessment/ Plan:   AKI on CKD3a, oliguric -baseline Cr around 1.1-1.3 -suspect AKI is secondary to prolonged pre-renal injury with concomitant ARB use--suspect ATN at this point.  No obstruction on imaging. UA indicative of UTI, receiving empiric antibiotics.  With proteinuria however concentrated sample, quantified--0.8g on UPC -Cr continues to rise, up to 6.9. At this junction, would continue with IVF given ongoing diarrhea--restarted fluids. Will give LR bolus of 500cc -no indications for renal replacement therapy at this junction, attempt to hold off and hope for renal recovery with supportive care -Avoid nephrotoxic medications including NSAIDs and iodinated intravenous contrast exposure unless the latter is absolutely indicated.  Preferred narcotic agents for pain control are hydromorphone , fentanyl , and methadone. Morphine should not be used. Avoid Baclofen and avoid oral sodium phosphate  and magnesium citrate based laxatives / bowel preps. Continue strict Input and Output monitoring. Will monitor the patient closely with you and intervene or adjust therapy as indicated by changes in clinical status/labs    Acute cholecystitis -per primary service. Surgery following -s/p choley drain 12/21 with IR   Sepsis -secondary to gastroenteritis +/- cholecystitis - On empiric antibiotics - Per primary   N/V/D -per primary service, receiving fluids   Left renal mass -recommend MRI with and without contrast as outpatient. If here longer than expected then can be done prior to discharge, will need to see urology if mass is suspicious for RCC   AGMA -secondary to AKI, confounded by diarrhea -improving with LR   Hypokalemia -improved   Elevated LFT's -continue to trend per primary -improving   HTN -BP soft, hold anti-HTNs -fluids with bolus as above   Thrombocytopenia -work up per primary    Subjective:   Patient seen and examined  bedside. S/p choley drain this AM, a lot of pain from this. Still having diarrhea.   Objective:   BP (!) 140/73 (BP Location: Left Arm)   Pulse 74   Temp (!) 97.4 F (36.3 C) (Oral)   Resp 20   Ht 6' 1 (1.854 m)   Wt 86.7 kg   SpO2 (!) 88%   BMI 25.21 kg/m   Intake/Output Summary (Last 24 hours) at 03/03/2024 1204 Last data filed at 03/03/2024 9081 Gross per 24 hour  Intake 906.19 ml  Output 360 ml  Net 546.19 ml   Weight change:   Physical Exam: Gen: in pain CVS: RRR Resp: CTA BL Abd: +choley drain, TTP Ext: no edema Neuro: awake, alert  Imaging: IR Perc Cholecystostomy Result Date: 03/03/2024 INDICATION: Acute cholecystitis, non operative candidate EXAM: ULTRASOUND FLUOROSCOPIC PERCUTANEOUS TRANSHEPATIC CHOLECYSTOSTOMY MEDICATIONS: Patient is already receiving IV antibiotics as an inpatient ANESTHESIA/SEDATION: Moderate (conscious) sedation was employed during this procedure. A total of Versed  1.0 mg and Fentanyl  50 mcg was administered intravenously by the radiology nurse. Total intra-service moderate Sedation Time: 10 minutes. The patient's level of consciousness and vital signs were monitored continuously by radiology nursing throughout the procedure under my direct supervision. FLUOROSCOPY: Radiation Exposure Index (as provided by the fluoroscopic device): 1.4 mGy Kerma COMPLICATIONS: None immediate. PROCEDURE: Informed written consent was obtained from the patient after a thorough discussion of the procedural risks, benefits and alternatives. All questions were addressed. Maximal Sterile Barrier Technique was utilized including caps, mask, sterile gowns, sterile gloves, sterile drape, hand hygiene and skin antiseptic. A timeout was performed prior to the initiation of the procedure. Previous imaging reviewed. Preliminary ultrasound performed. The distended gallbladder was localized in the right  upper quadrant. Lower intercostal space marked. Under sterile conditions and  local anesthesia, percutaneous needle access performed through a transhepatic window with an 18 gauge 15 cm trocar needle. Images obtained for documentation. There was return of blood tinged bile. Sample sent for culture. Guidewire inserted followed by tract dilatation to insert a 10 French drain. Drain catheter position confirmed with ultrasound and fluoroscopy. Images again obtained for documentation. Gallbladder decompressed by syringe aspiration removing 80 cc. Catheter secured with a Prolene suture and a sterile dressing. Gravity drainage bag connected. No immediate complication. Patient tolerated the procedure well. IMPRESSION: Successful ultrasound and fluoroscopic percutaneous transhepatic cholecystostomy Electronically Signed   By: CHRISTELLA.  Shick M.D.   On: 03/03/2024 09:28   CT ABDOMEN PELVIS WO CONTRAST Result Date: 03/01/2024 CLINICAL DATA:  Abdominal pain, acute, nonlocalized R abd pain, renal failure. EXAM: CT ABDOMEN AND PELVIS WITHOUT CONTRAST TECHNIQUE: Multidetector CT imaging of the abdomen and pelvis was performed following the standard protocol without IV contrast. RADIATION DOSE REDUCTION: This exam was performed according to the departmental dose-optimization program which includes automated exposure control, adjustment of the mA and/or kV according to patient size and/or use of iterative reconstruction technique. COMPARISON:  None Available. FINDINGS: Lower chest: There is ground-glass opacity in the right lower lobe, concerning for pneumonitis. There also associated linear areas of atelectasis/scarring in the right lower lobe. No pleural effusion. Mildly enlarged heart size. No pericardial effusion. There are coronary artery atherosclerotic calcifications, in keeping with coronary artery disease. Hepatobiliary: The liver is normal in size. Non-cirrhotic configuration. No suspicious mass. No intrahepatic or extrahepatic bile duct dilation. No calcified choledocholithiasis. The gallbladder is  distended measuring up to 5.4 cm in width and exhibit mild-to-moderate circumferential wall thickening and moderate pericholecystic fat stranding. There are several subcentimeter sized faintly calcified gallstones. There are few prominent pericholecystic lymph nodes. Findings are compatible with acute cholecystitis. Pancreas: Unremarkable. No pancreatic ductal dilatation or surrounding inflammatory changes. Spleen: Within normal limits. No focal lesion. Adrenals/Urinary Tract: Unremarkable left adrenal gland. 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 Asymmetrically moderately atrophic right kidney noted. There is right extrarenal pelvis. There is mild fullness in the right renal collecting system. No right nephroureterolithiasis. There is a partially exophytic simple cyst arising from the left kidney interpolar region, posteriorly measuring 4.8 x 6.4 cm. There is a partially exophytic 2.5 x 2.7 cm soft tissue mass arising from the left kidney upper pole, which cannot be characterized as a simple cyst on this exam. There is indeterminate. Further evaluation with multiphasic contrast-enhanced MRI abdomen as per renal mass protocol is recommended. No left nephroureterolithiasis or obstructive uropathy. Stomach/Bowel: There is a small sliding hiatal hernia. There is a diverticulum arising from the second part of duodenum. No disproportionate dilation of the small or large bowel loops. Unremarkable appendix. 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 There is mild asymmetric thickening of the hepatic flexure of colon which is likely secondary to inflammation from acute cholecystitis. Vascular/Lymphatic: No ascites or pneumoperitoneum. No abdominal or pelvic lymphadenopathy, by size criteria. No aneurysmal dilation of the major abdominal arteries. There are moderate peripheral atherosclerotic vascular calcifications of the aorta and its major branches. Reproductive: Normal size prostate. Symmetric  seminal vesicles. Other: There is a small fat containing umbilical hernia and bilateral small fat containing inguinal hernias. The soft tissues and abdominal wall are otherwise unremarkable. Musculoskeletal: No suspicious osseous lesions. There are mild - moderate multilevel degenerative changes in the visualized spine. IMPRESSION: 1. Findings compatible with acute  cholecystitis. No calcified choledocholithiasis. 2. There is a 2.5 x 2.7 cm soft tissue mass arising from the left kidney upper pole, which cannot be characterized as a simple cyst on this exam. Further evaluation with multiphasic contrast-enhanced MRI abdomen as per renal mass protocol is recommended. 3. 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 4. Ground-glass opacity in the right lower lobe, concerning for pneumonitis. Correlate clinically. 5. Multiple other nonacute observations, as described above. Aortic Atherosclerosis (ICD10-I70.0). Electronically Signed   By: Ree Molt M.D.   On: 03/01/2024 17:22    Labs: BMET Recent Labs  Lab 03/01/24 1507 03/01/24 2223 03/02/24 0252 03/03/24 0401  NA 135  --  135 135  K 2.9*  --  3.4* 3.6  CL 95*  --  102 101  CO2 25  --  16* 18*  GLUCOSE 105*  --  87 94  BUN 70*  --  74* 79*  CREATININE 6.00* 5.87* 5.97* 6.90*  CALCIUM  8.7*  --  8.0* 8.4*  PHOS  --   --   --  3.3   CBC Recent Labs  Lab 03/01/24 1507 03/01/24 2223 03/02/24 0252 03/03/24 0401  WBC 18.3* 19.8* 19.3* 16.7*  HGB 14.8 14.8 14.4 14.4  HCT 41.6 43.2 41.6 42.0  MCV 83.9 85.9 85.1 85.2  PLT 83* 86* 81* 92*    Medications:     heparin   5,000 Units Subcutaneous Q8H   sodium chloride  flush  5 mL Intracatheter Q8H      Ephriam Stank, MD [facility] 03/03/2024, 12:04 PM   "

## 2024-03-04 ENCOUNTER — Inpatient Hospital Stay (HOSPITAL_COMMUNITY)

## 2024-03-04 DIAGNOSIS — R0602 Shortness of breath: Secondary | ICD-10-CM

## 2024-03-04 DIAGNOSIS — R34 Anuria and oliguria: Secondary | ICD-10-CM | POA: Diagnosis not present

## 2024-03-04 DIAGNOSIS — K810 Acute cholecystitis: Secondary | ICD-10-CM | POA: Diagnosis not present

## 2024-03-04 DIAGNOSIS — J9601 Acute respiratory failure with hypoxia: Secondary | ICD-10-CM | POA: Diagnosis not present

## 2024-03-04 DIAGNOSIS — I4729 Other ventricular tachycardia: Secondary | ICD-10-CM | POA: Diagnosis not present

## 2024-03-04 DIAGNOSIS — A419 Sepsis, unspecified organism: Secondary | ICD-10-CM | POA: Diagnosis not present

## 2024-03-04 DIAGNOSIS — I10 Essential (primary) hypertension: Secondary | ICD-10-CM

## 2024-03-04 DIAGNOSIS — N179 Acute kidney failure, unspecified: Secondary | ICD-10-CM | POA: Diagnosis not present

## 2024-03-04 DIAGNOSIS — R6520 Severe sepsis without septic shock: Secondary | ICD-10-CM

## 2024-03-04 DIAGNOSIS — I493 Ventricular premature depolarization: Secondary | ICD-10-CM | POA: Diagnosis not present

## 2024-03-04 DIAGNOSIS — D696 Thrombocytopenia, unspecified: Secondary | ICD-10-CM | POA: Diagnosis not present

## 2024-03-04 LAB — ECHOCARDIOGRAM COMPLETE
AR max vel: 2.06 cm2
AV Area VTI: 2.15 cm2
AV Area mean vel: 2.07 cm2
AV Mean grad: 8 mmHg
AV Peak grad: 14 mmHg
Ao pk vel: 1.87 m/s
Area-P 1/2: 4.15 cm2
Height: 73 in
MV M vel: 1.25 m/s
MV Peak grad: 6.3 mmHg
S' Lateral: 3.5 cm
Weight: 3057.6 [oz_av]

## 2024-03-04 LAB — BASIC METABOLIC PANEL WITH GFR
Anion gap: 18 — ABNORMAL HIGH (ref 5–15)
BUN: 88 mg/dL — ABNORMAL HIGH (ref 8–23)
CO2: 18 mmol/L — ABNORMAL LOW (ref 22–32)
Calcium: 8.4 mg/dL — ABNORMAL LOW (ref 8.9–10.3)
Chloride: 101 mmol/L (ref 98–111)
Creatinine, Ser: 7.41 mg/dL — ABNORMAL HIGH (ref 0.61–1.24)
GFR, Estimated: 7 mL/min — ABNORMAL LOW
Glucose, Bld: 92 mg/dL (ref 70–99)
Potassium: 4 mmol/L (ref 3.5–5.1)
Sodium: 137 mmol/L (ref 135–145)

## 2024-03-04 LAB — HEPATIC FUNCTION PANEL
ALT: 27 U/L (ref 0–44)
AST: 31 U/L (ref 15–41)
Albumin: 2.7 g/dL — ABNORMAL LOW (ref 3.5–5.0)
Alkaline Phosphatase: 108 U/L (ref 38–126)
Bilirubin, Direct: 1.2 mg/dL — ABNORMAL HIGH (ref 0.0–0.2)
Indirect Bilirubin: 0.6 mg/dL (ref 0.3–0.9)
Total Bilirubin: 1.7 mg/dL — ABNORMAL HIGH (ref 0.0–1.2)
Total Protein: 6.1 g/dL — ABNORMAL LOW (ref 6.5–8.1)

## 2024-03-04 LAB — URINE CULTURE: Culture: NO GROWTH

## 2024-03-04 LAB — HEPATITIS B SURFACE ANTIGEN: Hepatitis B Surface Ag: NONREACTIVE

## 2024-03-04 LAB — PRO BRAIN NATRIURETIC PEPTIDE: Pro Brain Natriuretic Peptide: 18836 pg/mL — ABNORMAL HIGH

## 2024-03-04 LAB — CBC
HCT: 41.8 % (ref 39.0–52.0)
Hemoglobin: 14 g/dL (ref 13.0–17.0)
MCH: 29 pg (ref 26.0–34.0)
MCHC: 33.5 g/dL (ref 30.0–36.0)
MCV: 86.7 fL (ref 80.0–100.0)
Platelets: 95 K/uL — ABNORMAL LOW (ref 150–400)
RBC: 4.82 MIL/uL (ref 4.22–5.81)
RDW: 15.3 % (ref 11.5–15.5)
WBC: 12.8 K/uL — ABNORMAL HIGH (ref 4.0–10.5)
nRBC: 0 % (ref 0.0–0.2)

## 2024-03-04 MED ORDER — METOPROLOL SUCCINATE ER 25 MG PO TB24
25.0000 mg | ORAL_TABLET | Freq: Every day | ORAL | Status: DC
  Administered 2024-03-04 – 2024-03-07 (×4): 25 mg via ORAL
  Filled 2024-03-04 (×4): qty 1

## 2024-03-04 MED ORDER — CHLORHEXIDINE GLUCONATE CLOTH 2 % EX PADS
6.0000 | MEDICATED_PAD | Freq: Every day | CUTANEOUS | Status: DC
  Administered 2024-03-04 – 2024-03-16 (×12): 6 via TOPICAL

## 2024-03-04 NOTE — TOC CM/SW Note (Signed)
 Transition of Care [facility]) - Inpatient Brief Assessment   Patient Details  Name: [name] MRN: [id number] Date of Birth: [birth year]  [facility] CM/SW Contact:    Waddell Barnie Rama, RN Phone Number: 03/04/2024, 3:16 PM   Clinical Narrative: From home with spouse, has PCP and insurance on file, states has no HH services in place at this time , has walker/cane at home.  States family member will transport them home at [employer] and family is support system, states gets medications from [facility] on [place].  Pta self ambulatory.   There are no ICM needs identified  at this time.  Please place consult for ICM needs.     Transition of Care Asessment:   Patient has primary care physician: Yes Home environment has been reviewed: home with wife Prior level of function:: indep , has walker/cane don't use Prior/Current Home Services: Current home services (walker/cane) Social Drivers of Health Review: SDOH reviewed no interventions necessary Readmission risk has been reviewed: Yes Transition of care needs: no transition of care needs at this time

## 2024-03-04 NOTE — Progress Notes (Signed)
 "   Rounding Note    Patient Name: [name] Date of Encounter: 03/04/2024  [facility] Cardiologist: Shelda Bruckner, MD  Chief Complaint: Nausea, vomiting, diarrhea  Reason of consult: NSVT  Subjective   Patient seen and examined at bedside. No anginal chest pain or heart failure symptoms. Still complains of pain on the right lateral abdominal wall  Inpatient Medications    Scheduled Meds:  Chlorhexidine  Gluconate Cloth  6 each Topical Q0600   heparin   5,000 Units Subcutaneous Q8H   lidocaine   1 patch Transdermal Q24H   metoprolol  succinate  25 mg Oral Daily   sodium chloride  flush  5 mL Intracatheter Q8H   Continuous Infusions:  lactated ringers  100 mL/hr at 03/04/24 0458   piperacillin -tazobactam (ZOSYN )  IV 3.375 g (03/04/24 0925)   PRN Meds: acetaminophen  **OR** acetaminophen , albuterol , HYDROmorphone  (DILAUDID ) injection, iohexol , loperamide , ondansetron  (ZOFRAN ) IV, oxyCODONE    Vital Signs    Vitals:   03/03/24 2300 03/04/24 0200 03/04/24 0300 03/04/24 0802  BP: (!) 101/55  120/65 114/60  Pulse: 91 87 85 78  Resp: 20  20 18   Temp: (!) 97.4 F (36.3 C)  (!) 97.4 F (36.3 C) (!) 97.5 F (36.4 C)  TempSrc: Oral  Oral Oral  SpO2: 90% 93% 95% 95%  Weight:      Height:        Intake/Output Summary (Last 24 hours) at 03/04/2024 0941 Last data filed at 03/04/2024 0458 Gross per 24 hour  Intake 1010 ml  Output 250 ml  Net 760 ml      03/01/2024    9:09 PM 03/01/2024    3:05 PM 12/26/2023    1:27 PM  Last 3 Weights  Weight (lbs) 191 lb 1.6 oz 194 lb 10.7 oz 194 lb 9.6 oz  Weight (kg) 86.682 kg 88.3 kg 88.27 kg      Telemetry    Sinus rhythm with PVCs (burden lower)- Personally Reviewed  ECG    No new tracings- Personally Reviewed  Physical Exam   General: Age-appropriate, hemodynamically stable, no acute distress HEENT: Normocephalic, atraumatic, trachea midline and no JVP Lungs: Clear to auscultation bilaterally  no wheezes rales or rhonchi's Heart: Regular rate and rhythm, positive S1-S2 Abdomen: Soft, distended, nontender, drain present Extremities: Warm to touch, no lower extremity swelling Neuro: Moves all 4 extremities, nonfocal Psych: Cooperative, pleasant  Labs    High Sensitivity Troponin:  No results for input(s): TROPONINIHS in the last 720 hours.   Chemistry Recent Labs  Lab 03/01/24 1508 03/01/24 2223 03/02/24 0252 03/03/24 0401 03/03/24 1324 03/04/24 0244  NA  --   --  135 135  --  137  K  --   --  3.4* 3.6  --  4.0  CL  --   --  102 101  --  101  CO2  --   --  16* 18*  --  18*  GLUCOSE  --   --  87 94  --  92  BUN  --   --  74* 79*  --  88*  CREATININE  --    < > 5.97* 6.90*  --  7.41*  CALCIUM   --   --  8.0* 8.4*  --  8.4*  MG 2.5*  --  2.2  --  2.0  --   PROT  --   --  5.9* 6.3*  --  6.1*  ALBUMIN  --   --  2.6* 2.8*  2.8*  --  2.7*  AST  --   --  61* 45*  --  31  ALT  --   --  39 34  --  27  ALKPHOS  --   --  142* 139*  --  108  BILITOT  --   --  2.1* 2.1*  --  1.7*  GFRNONAA  --    < > 9* 8*  --  7*  ANIONGAP  --   --  17* 16*  --  18*   < > = values in this interval not displayed.    Lipids No results for input(s): CHOL, TRIG, HDL, LABVLDL, LDLCALC, CHOLHDL in the last 168 hours.  Hematology Recent Labs  Lab 03/02/24 0252 03/03/24 0401 03/04/24 0244  WBC 19.3* 16.7* 12.8*  RBC 4.89 4.93 4.82  HGB 14.4 14.4 14.0  HCT 41.6 42.0 41.8  MCV 85.1 85.2 86.7  MCH 29.4 29.2 29.0  MCHC 34.6 34.3 33.5  RDW 14.3 14.7 15.3  PLT 81* 92* 95*   Thyroid   Recent Labs  Lab 03/03/24 1324  TSH 3.710    BNPNo results for input(s): BNP, PROBNP in the last 168 hours.  DDimer No results for input(s): DDIMER in the last 168 hours.   Radiology    N/A  Cardiac Studies   Echocardiogram: 03/04/2024: Pending  Patient Profile     78 y.o. male with a hx of hypertension, hyperlipidemia, PVCs who is being seen for the evaluation of nonsustained  ventricular tachycardia at the request of Al-Sultani, Anmar, MD.   Assessment & Plan    Impression: Premature ventricular contractions. Sepsis secondary to gastroenteritis/cholecystitis. Acute kidney injury. Thrombocytopenia. Hypertension. Hyperlipidemia  Recommendations: Cardiology consulted 03/03/2024 due to increased PVC burden and concerns for NSVT. Clinically he is asymptomatic and hemodynamically stable. Currently being admitted for sepsis and acute renal failure. Echocardiogram ordered, results pending Over the last 24 hours patient's telemetry illustrates underlying rhythm to be sinus and PVC burden has significantly improved. Will restart home dose Toprol -XL. As long as the echocardiogram unremarkable, vital signs remained stable, would recommend outpatient follow-up. Would not pursue any ischemic workup inpatient given the fact that he is asymptomatic and given his acute renal failure.  Patient agreeable with plan of care. Outpatient would recommend repeat cardiac monitor to evaluate PVC burden and consider EP evaluation based on results. Will follow peripherally.  For questions or updates, please contact [facility] Please consult [email or web address] for contact info under        Signed, Madonna Michele HAS, [facility]  [facility]  A Division of [facility] [address], [place], [place] [address]  Pager: [phone] Office: [phone] 03/04/2024, 9:41 AM     "

## 2024-03-04 NOTE — Progress Notes (Signed)
 " PROGRESS NOTE    [name]  FMW:[id number] DOB: 03/20/1945 DOA: 03/01/2024 PCP: Kennyth Worth HERO, MD    Brief Narrative:  Patient is a 78 year old male with PMHx of HTN and HLD who presented to the ED on 03/01/2024 with several days of nausea, vomiting, and diarrhea.  Symptoms began on 12/14 after returning from church and eating a frozen [employer] chicken meal, followed about an hour later by chills/subjective fevers with rigors, then profuse emesis and subsequent multiple episodes of loose watery stools.  Emesis progressed from food contents to watery, nonbloody.  Stools watery without blood or mucus.  He reports poor p.o. intake since Sunday and generalized, nonfocal abdominal discomfort.  Also notes markedly decreased urine output over the past 2 days with dark orange urine, without gross hematuria.  In the ED he was found to have AKI and hypokalemia, with CT abdomen findings concerning for acute cholecystitis.  He was admitted to the hospital service for further management.  Assessment and Plan:  AKI on CKD stage IIIa  Oliguria - Likely prerenal in the setting of decreased PO intake and increased GI losses with concomitant Losartan  use. Nephrology suspecting ATN given no obstruction.  - Cr worsening to 7.41, baseline ~ 1.1-1.3 - Currently producing very little urine, 200 mL out yesterday. - Nephrology following, recommending placement of temp HD cath tomorrow followed by HD - Strict I&O, bladder scan q8h  Acute hypoxic respiratory failure secondary to volume overload - Patient not on O2 at baseline with no known history of CHF. Currently net +5.1L. Patient became tachypneic and hypoxic to 88% on RA. Improved to 93-97% on 2L [place]. - ProBNP elevated to 18,000 - IVFs stopped - Nephrology following, planning to initiate HD tomorrow after temp HD cath placement - Echo pending - CXR pending  HAGMA - Bicarb 18, AG corrected for albumin 21.3, delta ratio 1.5 indicating  uncomplicated HAGMA - Lactic acid wnl - Likely secondary to acute renal failure - Will be addressed with HD tomorrow - Nephrology following  Sepsis Intermittent hypotension - Met SIRS criteria on admission with tachycardia, tachypnea, leukocytosis, likely secondary to cholecystitis, gastroenteritis and/or acute cystitis - Afebrile, leukocytosis improving - Blood cultures NGTD - Urine culture showed no growth - Continue IV Zosyn  - PCCM consulted given intermittent hypotension in the setting of AKI and sepsis secondary to cholecystitis/gastroenteritis but patient deemed stable without ICU needs at the time of evaluation   Acute cholecystitis - Has RUQ pain on palpation with CT evidence of acute cholecystitis without CBD dilation - General surgery consulted, recommending placement of percutaneous cholecystostomy tubes by IR given ongoing AKI and thrombocytopenia with plans for cholecystectomy at a later time - Underwent PCT placement by IR today - Bile culture pending - Continue IV Zosyn   Gastroenteritis - Patient presented with N/V and diarrhea which started after eating frozen meal - N/V x 2 overnight after sips of water . No episodes of diarrhea since 12/21 AM - GIPP and C diff PCR negative - Continue conservative management - NPO sips with meds - PRN Zofran   - PRN imodium   PVCs  NSVT - Tele alerted to multiple runs of VT  on 12/21 during which patient was asymptomatic and HDS. - Patient with known history of high PVC burden. - Cardiology consulted, determined NSVT with elevated burden of PVC worsened by ongoing acute illness, recommended echo and electrolyte replacement - Echo pending - Home Toprol -XL resumed  UTI ruled out - Urine culture showed no growth  Hypokalemia -  resolved  Thrombocytopenia - Platelets decreased but relatively stable - No overt signs/symptoms of bleeding - Monitoring  HTN - BP currently low-normal  - Continue holding home  antihypertensives  Left renal mass - CT abd/pel showed soft tissue mass on left kidney - Will need MRI with contrast per renal mass protocol at discharge  Transaminitis Hyperbilirubinemia  -  AST normal, normal ALT, elevated ALP - T. Bili improving - Possibly related to cholecystitis  HLD - Statin held due to transaminits  DVT prophylaxis: heparin  injection 5,000 Units Start: 03/01/24 2215   Code Status:   Code Status: Full Code  Family Communication: None at bedside  Disposition Plan: Pending clinical improvement, AKI, cholecystitis PT - Follow Up Recommendations: No PT follow up - PT equipment: None recommended by PT OT -   -    Level of care: Progressive  Consultants:  Nephrology, general surgery, IR  Procedures:  PCT placement by IR - 12/21  Antimicrobials: IV Zosyn    Subjective: Examined at bedside. Doing okay overall. Denies shortness of breath but SpO2 has been dropping to upper 80s. Not making much urine. Nephrology already discussed starting HD tomorrow after temp HD cath is placed, answered questions about that. Had no episode of diarrhea since yesterday morning. Had 2 episodes of nausea and vomiting overnight after taking sips of water .  Objective: Vitals:   03/03/24 2300 03/04/24 0200 03/04/24 0300 03/04/24 0802  BP: (!) 101/55  120/65 114/60  Pulse: 91 87 85 78  Resp: 20  20 18   Temp: (!) 97.4 F (36.3 C)  (!) 97.4 F (36.3 C) (!) 97.5 F (36.4 C)  TempSrc: Oral  Oral Oral  SpO2: 90% 93% 95% 95%  Weight:      Height:        Intake/Output Summary (Last 24 hours) at 03/04/2024 1034 Last data filed at 03/04/2024 0458 Gross per 24 hour  Intake 1010 ml  Output 250 ml  Net 760 ml   Filed Weights   03/01/24 1505 03/01/24 2109  Weight: 88.3 kg 86.7 kg    Examination:  Gen: NAD, A&Ox3, appears uncomfortable HEENT: NCAT, EOMI Neck: Supple, no JVD CV: tachycardic, regular rhythm, no murmurs Resp: normal WOB, bilateral lower lobe  crackles Abd: Soft, RUQ PCT drain in place with dark green contents, abdomen tender, nondistended Ext: Trace pedal edema Skin: Warm, dry, no rashes/lesions Neuro: No focal deficits Psych: Calm, cooperative, appropriate affect   Data Reviewed: I have personally reviewed following labs and imaging studies  CBC: Recent Labs  Lab 03/01/24 1507 03/01/24 2223 03/02/24 0252 03/03/24 0401 03/04/24 0244  WBC 18.3* 19.8* 19.3* 16.7* 12.8*  HGB 14.8 14.8 14.4 14.4 14.0  HCT 41.6 43.2 41.6 42.0 41.8  MCV 83.9 85.9 85.1 85.2 86.7  PLT 83* 86* 81* 92* 95*   Basic Metabolic Panel: Recent Labs  Lab 03/01/24 1507 03/01/24 1508 03/01/24 2223 03/02/24 0252 03/03/24 0401 03/03/24 1324 03/04/24 0244  NA 135  --   --  135 135  --  137  K 2.9*  --   --  3.4* 3.6  --  4.0  CL 95*  --   --  102 101  --  101  CO2 25  --   --  16* 18*  --  18*  GLUCOSE 105*  --   --  87 94  --  92  BUN 70*  --   --  74* 79*  --  88*  CREATININE 6.00*  --  5.87* 5.97*  6.90*  --  7.41*  CALCIUM  8.7*  --   --  8.0* 8.4*  --  8.4*  MG  --  2.5*  --  2.2  --  2.0  --   PHOS  --   --   --   --  3.3  --   --    GFR: Estimated Creatinine Clearance: 9.6 mL/min (A) (by C-G formula based on SCr of 7.41 mg/dL (H)). Liver Function Tests: Recent Labs  Lab 03/01/24 1507 03/02/24 0252 03/03/24 0401 03/04/24 0244  AST 57* 61* 45* 31  ALT 40 39 34 27  ALKPHOS 148* 142* 139* 108  BILITOT 2.5* 2.1* 2.1* 1.7*  PROT 6.4* 5.9* 6.3* 6.1*  ALBUMIN 3.2* 2.6* 2.8*  2.8* 2.7*   Recent Labs  Lab 03/01/24 1507  LIPASE 97*   No results for input(s): AMMONIA in the last 168 hours. Coagulation Profile: Recent Labs  Lab 03/02/24 1338  INR 1.0   Cardiac Enzymes: No results for input(s): CKTOTAL, CKMB, CKMBINDEX, TROPONINI in the last 168 hours. BNP (last 3 results) No results for input(s): PROBNP in the last 8760 hours. HbA1C: No results for input(s): HGBA1C in the last 72 hours. CBG: No results for  input(s): GLUCAP in the last 168 hours. Lipid Profile: No results for input(s): CHOL, HDL, LDLCALC, TRIG, CHOLHDL, LDLDIRECT in the last 72 hours. Thyroid  Function Tests: Recent Labs    03/03/24 1324  TSH 3.710   Anemia Panel: No results for input(s): VITAMINB12, FOLATE, FERRITIN, TIBC, IRON, RETICCTPCT in the last 72 hours. Sepsis Labs: Recent Labs  Lab 03/01/24 1741 03/01/24 1940  LATICACIDVEN 1.5 1.3    Recent Results (from the past 240 hours)  Resp panel by RT-PCR (RSV, Flu A&B, Covid) Anterior Nasal Swab     Status: None   Collection Time: 03/01/24  3:08 PM   Specimen: Anterior Nasal Swab  Result Value Ref Range Status   SARS Coronavirus 2 by RT PCR NEGATIVE NEGATIVE Final    Comment: (NOTE) SARS-CoV-2 target nucleic acids are NOT DETECTED.  The SARS-CoV-2 RNA is generally detectable in upper respiratory specimens during the acute phase of infection. The lowest concentration of SARS-CoV-2 viral copies this assay can detect is 138 copies/mL. A negative result does not preclude SARS-Cov-2 infection and should not be used as the sole basis for treatment or other patient management decisions. A negative result may occur with  improper specimen collection/handling, submission of specimen other than nasopharyngeal swab, presence of viral mutation(s) within the areas targeted by this assay, and inadequate number of viral copies(<138 copies/mL). A negative result must be combined with clinical observations, patient history, and epidemiological information. The expected result is Negative.  Fact Sheet for Patients:  bloggercourse.com  Fact Sheet for Healthcare Providers:  seriousbroker.it  This test is no t yet approved or cleared by the [place]  FDA and  has been authorized for detection and/or diagnosis of SARS-CoV-2 by FDA under an Emergency Use Authorization (EUA). This EUA will remain   in effect (meaning this test can be used) for the duration of the COVID-19 declaration under Section 564(b)(1) of the Act, 21 U.S.C.section 360bbb-3(b)(1), unless the authorization is terminated  or revoked sooner.       Influenza A by PCR NEGATIVE NEGATIVE Final   Influenza B by PCR NEGATIVE NEGATIVE Final    Comment: (NOTE) The Xpert Xpress SARS-CoV-2/FLU/RSV plus assay is intended as an aid in the diagnosis of influenza from Nasopharyngeal swab specimens and should not be  used as a sole basis for treatment. Nasal washings and aspirates are unacceptable for Xpert Xpress SARS-CoV-2/FLU/RSV testing.  Fact Sheet for Patients: bloggercourse.com  Fact Sheet for Healthcare Providers: seriousbroker.it  This test is not yet approved or cleared by the [place]  FDA and has been authorized for detection and/or diagnosis of SARS-CoV-2 by FDA under an Emergency Use Authorization (EUA). This EUA will remain in effect (meaning this test can be used) for the duration of the COVID-19 declaration under Section 564(b)(1) of the Act, 21 U.S.C. section 360bbb-3(b)(1), unless the authorization is terminated or revoked.     Resp Syncytial Virus by PCR NEGATIVE NEGATIVE Final    Comment: (NOTE) Fact Sheet for Patients: bloggercourse.com  Fact Sheet for Healthcare Providers: seriousbroker.it  This test is not yet approved or cleared by the [place]  FDA and has been authorized for detection and/or diagnosis of SARS-CoV-2 by FDA under an Emergency Use Authorization (EUA). This EUA will remain in effect (meaning this test can be used) for the duration of the COVID-19 declaration under Section 564(b)(1) of the Act, 21 U.S.C. section 360bbb-3(b)(1), unless the authorization is terminated or revoked.  Performed at [facility], [address] [place], [place] [address]    Blood culture (routine x 2)     Status: None (Preliminary result)   Collection Time: 03/01/24  5:25 PM   Specimen: BLOOD  Result Value Ref Range Status   Specimen Description   Final    BLOOD RIGHT ANTECUBITAL Performed at [facility], [address] [place], [place] [address]    Special Requests   Final    BOTTLES DRAWN AEROBIC AND ANAEROBIC Blood Culture adequate volume Performed at [facility], [address] [place], [place] [address]    Culture   Final    NO GROWTH 2 DAYS Performed at [facility], [address] [place], [place] [address]    Report Status PENDING  Incomplete  Blood culture (routine x 2)     Status: None (Preliminary result)   Collection Time: 03/01/24  5:41 PM   Specimen: BLOOD RIGHT FOREARM  Result Value Ref Range Status   Specimen Description   Final    BLOOD RIGHT FOREARM Performed at [facility], [address] [place], [place] [address]    Special Requests   Final    BOTTLES DRAWN AEROBIC AND ANAEROBIC Blood Culture adequate volume Performed at [facility], [address] [place], [place] [address]    Culture   Final    NO GROWTH 2 DAYS Performed at [facility], [address] [place], [place] [address]    Report Status PENDING  Incomplete  Gastrointestinal Panel by PCR , Stool     Status: None   Collection Time: 03/01/24 10:05 PM   Specimen: Stool  Result Value Ref Range Status   Campylobacter species NOT DETECTED NOT DETECTED Final   Plesimonas shigelloides NOT DETECTED NOT DETECTED Final   Salmonella species NOT DETECTED NOT DETECTED Final   Yersinia enterocolitica NOT DETECTED NOT DETECTED Final   Vibrio species NOT DETECTED NOT DETECTED Final   Vibrio cholerae NOT DETECTED NOT DETECTED Final   Enteroaggregative E coli (EAEC) NOT DETECTED NOT DETECTED Final   Enteropathogenic E coli (EPEC) NOT DETECTED NOT DETECTED Final   Enterotoxigenic E coli (ETEC) NOT DETECTED NOT  DETECTED Final   Shiga like toxin producing E coli (STEC) NOT DETECTED  NOT DETECTED Final   Shigella/Enteroinvasive E coli (EIEC) NOT DETECTED NOT DETECTED Final   Cryptosporidium NOT DETECTED NOT DETECTED Final   Cyclospora cayetanensis NOT DETECTED NOT DETECTED Final   Entamoeba histolytica NOT DETECTED NOT DETECTED Final   Giardia lamblia NOT DETECTED NOT DETECTED Final   Adenovirus F40/41 NOT DETECTED NOT DETECTED Final   Astrovirus NOT DETECTED NOT DETECTED Final   Norovirus GI/GII NOT DETECTED NOT DETECTED Final   Rotavirus A NOT DETECTED NOT DETECTED Final   Sapovirus (I, II, IV, and V) NOT DETECTED NOT DETECTED Final    Comment: Performed at [facility], [address] [place], [place] [address]  C Difficile Quick Screen w PCR reflex     Status: None   Collection Time: 03/01/24 10:05 PM   Specimen: Stool  Result Value Ref Range Status   C Diff antigen NEGATIVE NEGATIVE Final   C Diff toxin NEGATIVE NEGATIVE Final   C Diff interpretation No C. difficile detected.  Final    Comment: Performed at [facility], [address] [place], [place] [address]  Urine Culture (for pregnant, neutropenic or urologic patients or patients with an indwelling urinary catheter)     Status: None   Collection Time: 03/02/24 10:11 PM   Specimen: Urine, Clean Catch  Result Value Ref Range Status   Specimen Description URINE, CLEAN CATCH  Final   Special Requests NONE  Final   Culture   Final    NO GROWTH Performed at [facility], [address], [place], [place] [address]    Report Status 03/04/2024 FINAL  Final  Aerobic/Anaerobic Culture w Gram Stain (surgical/deep wound)     Status: None (Preliminary result)   Collection Time: 03/03/24  8:26 AM   Specimen: Abscess  Result Value Ref Range Status   Specimen Description ABSCESS  Final   Special Requests GALLBLADDER  Final   Gram Stain   Final    ABUNDANT WBC PRESENT, PREDOMINANTLY PMN RARE GRAM NEGATIVE RODS     Culture   Final    ABUNDANT GRAM NEGATIVE RODS CULTURE REINCUBATED FOR BETTER GROWTH SUSCEPTIBILITIES TO FOLLOW Performed at [facility], [address] [place], [place] [address]    Report Status PENDING  Incomplete     Radiology Studies: IR Perc Cholecystostomy Result Date: 03/03/2024 INDICATION: Acute cholecystitis, non operative candidate EXAM: ULTRASOUND FLUOROSCOPIC PERCUTANEOUS TRANSHEPATIC CHOLECYSTOSTOMY MEDICATIONS: Patient is already receiving IV antibiotics as an inpatient ANESTHESIA/SEDATION: Moderate (conscious) sedation was employed during this procedure. A total of Versed  1.0 mg and Fentanyl  50 mcg was administered intravenously by the radiology nurse. Total intra-service moderate Sedation Time: 10 minutes. The patient's level of consciousness and vital signs were monitored continuously by radiology nursing throughout the procedure under my direct supervision. FLUOROSCOPY: Radiation Exposure Index (as provided by the fluoroscopic device): 1.4 mGy Kerma COMPLICATIONS: None immediate. PROCEDURE: Informed written consent was obtained from the patient after a thorough discussion of the procedural risks, benefits and alternatives. All questions were addressed. Maximal Sterile Barrier Technique was utilized including caps, mask, sterile gowns, sterile gloves, sterile drape, hand hygiene and skin antiseptic. A timeout was performed prior to the initiation of the procedure. Previous imaging reviewed. Preliminary ultrasound performed. The distended gallbladder was localized in the right upper quadrant. Lower intercostal space marked. Under sterile conditions and local anesthesia, percutaneous needle access performed through a transhepatic window with an 18 gauge 15 cm trocar needle. Images obtained for documentation. There was return of blood tinged  bile. Sample sent for culture. Guidewire inserted followed by tract dilatation to insert a 10 French drain. Drain catheter position confirmed  with ultrasound and fluoroscopy. Images again obtained for documentation. Gallbladder decompressed by syringe aspiration removing 80 cc. Catheter secured with a Prolene suture and a sterile dressing. Gravity drainage bag connected. No immediate complication. Patient tolerated the procedure well. IMPRESSION: Successful ultrasound and fluoroscopic percutaneous transhepatic cholecystostomy Electronically Signed   By: CHRISTELLA.  Shick M.D.   On: 03/03/2024 09:28    Scheduled Meds:  Chlorhexidine  Gluconate Cloth  6 each Topical Q0600   heparin   5,000 Units Subcutaneous Q8H   lidocaine   1 patch Transdermal Q24H   metoprolol  succinate  25 mg Oral Daily   sodium chloride  flush  5 mL Intracatheter Q8H   Continuous Infusions:  lactated ringers  100 mL/hr at 03/04/24 0458   piperacillin -tazobactam (ZOSYN )  IV 3.375 g (03/04/24 0925)     Unresulted Labs (From admission, onward)     Start     Ordered   03/05/24 0500  Renal function panel  Daily,   R     Question:  Specimen collection method  Answer:  Lab=Lab collect   03/04/24 0911   03/05/24 0500  CBC  Tomorrow morning,   R       Question:  Specimen collection method  Answer:  Lab=Lab collect   03/04/24 1121   03/05/24 0500  Hepatic function panel  Tomorrow morning,   R       Question:  Specimen collection method  Answer:  Lab=Lab collect   03/04/24 1121   03/04/24 0918  Hepatitis B surface antibody,quantitative  (New Admission [facility] (Hepatitis B))  Add-on,   AD       Question:  Specimen collection method  Answer:  Lab=Lab collect   03/04/24 0917             LOS:  LOS: 3 days   Time Spent: 45 minutes  Robertta Halfhill Al-Sultani, MD [facility]  If 7PM-7AM, please contact night-coverage  03/04/2024, 10:34 AM      "

## 2024-03-04 NOTE — Progress Notes (Signed)
 "   Referring Physician(s): Sebastian Moles, MD  Supervising Physician: Jenna Hacker  Patient Status:  [facility] - In-pt  Chief Complaint: Acute cholecystitis s/p percutaneous cholecystostomy placement 03/02/24 in IR  Subjective:  Patient on the floor, his bed is broken and he's unable to get comfortable - waiting for someone to come fix it. He would like to sit in a recliner but only regular chairs in the room. Drain is slightly tender to touch/movement, has been flushed without pain today. He is aware of need for dialysis and is agreeable to temporary HD catheter placement.  Allergies: Advil [ibuprofen] and Choline fenofibrate  Medications: Prior to Admission medications  Medication Sig Start Date End Date Taking? Authorizing Provider  atorvastatin  (LIPITOR) 40 MG tablet TAKE 1 TABLET BY MOUTH EVERY DAY 11/08/23  Yes Kennyth Worth HERO, MD  losartan  (COZAAR ) 50 MG tablet Take 1 tablet (50 mg total) by mouth daily. 02/02/24  Yes Kennyth Worth HERO, MD  metoprolol  succinate (TOPROL -XL) 25 MG 24 hr tablet Take 1 tablet (25 mg total) by mouth daily. Patient taking differently: Take 12.5 mg by mouth daily. 02/02/24  Yes Kennyth Worth HERO, MD  tadalafil  (CIALIS ) 20 MG tablet Take 0.5-1 tablets (10-20 mg total) by mouth daily as needed for erectile dysfunction. 11/09/23   Kennyth Worth HERO, MD     Vital Signs: BP 125/69 (BP Location: Left Arm)   Pulse 76   Temp (!) 97.3 F (36.3 C) (Axillary)   Resp 17   Ht 6' 1 (1.854 m)   Wt 191 lb 1.6 oz (86.7 kg)   SpO2 94%   BMI 25.21 kg/m   Physical Exam Vitals and nursing note reviewed.  Constitutional:      General: He is not in acute distress.    Appearance: He is ill-appearing.  HENT:     Head: Normocephalic.  Cardiovascular:     Rate and Rhythm: Normal rate.  Pulmonary:     Effort: Pulmonary effort is normal.  Abdominal:     General: There is no distension.     Palpations: Abdomen is soft.     Comments: (+) RUQ drain to gravity with  dark bilious output. Insertion site appropriately tender to palpation, no leakage or bleeding. Suture and stat lock in tact.  Skin:    General: Skin is warm and dry.     Coloration: Skin is not jaundiced.  Neurological:     Mental Status: He is alert.     Imaging: IR Perc Cholecystostomy Result Date: 03/03/2024 INDICATION: Acute cholecystitis, non operative candidate EXAM: ULTRASOUND FLUOROSCOPIC PERCUTANEOUS TRANSHEPATIC CHOLECYSTOSTOMY MEDICATIONS: Patient is already receiving IV antibiotics as an inpatient ANESTHESIA/SEDATION: Moderate (conscious) sedation was employed during this procedure. A total of Versed  1.0 mg and Fentanyl  50 mcg was administered intravenously by the radiology nurse. Total intra-service moderate Sedation Time: 10 minutes. The patient's level of consciousness and vital signs were monitored continuously by radiology nursing throughout the procedure under my direct supervision. FLUOROSCOPY: Radiation Exposure Index (as provided by the fluoroscopic device): 1.4 mGy Kerma COMPLICATIONS: None immediate. PROCEDURE: Informed written consent was obtained from the patient after a thorough discussion of the procedural risks, benefits and alternatives. All questions were addressed. Maximal Sterile Barrier Technique was utilized including caps, mask, sterile gowns, sterile gloves, sterile drape, hand hygiene and skin antiseptic. A timeout was performed prior to the initiation of the procedure. Previous imaging reviewed. Preliminary ultrasound performed. The distended gallbladder was localized in the right upper quadrant. Lower intercostal space marked. Under sterile  conditions and local anesthesia, percutaneous needle access performed through a transhepatic window with an 18 gauge 15 cm trocar needle. Images obtained for documentation. There was return of blood tinged bile. Sample sent for culture. Guidewire inserted followed by tract dilatation to insert a 10 French drain. Drain catheter  position confirmed with ultrasound and fluoroscopy. Images again obtained for documentation. Gallbladder decompressed by syringe aspiration removing 80 cc. Catheter secured with a Prolene suture and a sterile dressing. Gravity drainage bag connected. No immediate complication. Patient tolerated the procedure well. IMPRESSION: Successful ultrasound and fluoroscopic percutaneous transhepatic cholecystostomy Electronically Signed   By: CHRISTELLA.  Shick M.D.   On: 03/03/2024 09:28   CT ABDOMEN PELVIS WO CONTRAST Result Date: 03/01/2024 CLINICAL DATA:  Abdominal pain, acute, nonlocalized R abd pain, renal failure. EXAM: CT ABDOMEN AND PELVIS WITHOUT CONTRAST TECHNIQUE: Multidetector CT imaging of the abdomen and pelvis was performed following the standard protocol without IV contrast. RADIATION DOSE REDUCTION: This exam was performed according to the departmental dose-optimization program which includes automated exposure control, adjustment of the mA and/or kV according to patient size and/or use of iterative reconstruction technique. COMPARISON:  None Available. FINDINGS: Lower chest: There is ground-glass opacity in the right lower lobe, concerning for pneumonitis. There also associated linear areas of atelectasis/scarring in the right lower lobe. No pleural effusion. Mildly enlarged heart size. No pericardial effusion. There are coronary artery atherosclerotic calcifications, in keeping with coronary artery disease. Hepatobiliary: The liver is normal in size. Non-cirrhotic configuration. No suspicious mass. No intrahepatic or extrahepatic bile duct dilation. No calcified choledocholithiasis. The gallbladder is distended measuring up to 5.4 cm in width and exhibit mild-to-moderate circumferential wall thickening and moderate pericholecystic fat stranding. There are several subcentimeter sized faintly calcified gallstones. There are few prominent pericholecystic lymph nodes. Findings are compatible with acute  cholecystitis. Pancreas: Unremarkable. No pancreatic ductal dilatation or surrounding inflammatory changes. Spleen: Within normal limits. No focal lesion. Adrenals/Urinary Tract: Unremarkable left adrenal gland. 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 Asymmetrically moderately atrophic right kidney noted. There is right extrarenal pelvis. There is mild fullness in the right renal collecting system. No right nephroureterolithiasis. There is a partially exophytic simple cyst arising from the left kidney interpolar region, posteriorly measuring 4.8 x 6.4 cm. There is a partially exophytic 2.5 x 2.7 cm soft tissue mass arising from the left kidney upper pole, which cannot be characterized as a simple cyst on this exam. There is indeterminate. Further evaluation with multiphasic contrast-enhanced MRI abdomen as per renal mass protocol is recommended. No left nephroureterolithiasis or obstructive uropathy. Stomach/Bowel: There is a small sliding hiatal hernia. There is a diverticulum arising from the second part of duodenum. No disproportionate dilation of the small or large bowel loops. Unremarkable appendix. 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 There is mild asymmetric thickening of the hepatic flexure of colon which is likely secondary to inflammation from acute cholecystitis. Vascular/Lymphatic: No ascites or pneumoperitoneum. No abdominal or pelvic lymphadenopathy, by size criteria. No aneurysmal dilation of the major abdominal arteries. There are moderate peripheral atherosclerotic vascular calcifications of the aorta and its major branches. Reproductive: Normal size prostate. Symmetric seminal vesicles. Other: There is a small fat containing umbilical hernia and bilateral small fat containing inguinal hernias. The soft tissues and abdominal wall are otherwise unremarkable. Musculoskeletal: No suspicious osseous lesions. There are mild - moderate multilevel degenerative changes in  the visualized spine. IMPRESSION: 1. Findings compatible with acute cholecystitis. No calcified choledocholithiasis. 2. There is a  2.5 x 2.7 cm soft tissue mass arising from the left kidney upper pole, which cannot be characterized as a simple cyst on this exam. Further evaluation with multiphasic contrast-enhanced MRI abdomen as per renal mass protocol is recommended. 3. 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 4. Ground-glass opacity in the right lower lobe, concerning for pneumonitis. Correlate clinically. 5. Multiple other nonacute observations, as described above. Aortic Atherosclerosis (ICD10-I70.0). Electronically Signed   By: Ree Molt M.D.   On: 03/01/2024 17:22    Labs:  CBC: Recent Labs    03/01/24 2223 03/02/24 0252 03/03/24 0401 03/04/24 0244  WBC 19.8* 19.3* 16.7* 12.8*  HGB 14.8 14.4 14.4 14.0  HCT 43.2 41.6 42.0 41.8  PLT 86* 81* 92* 95*    COAGS: Recent Labs    03/02/24 1338  INR 1.0    BMP: Recent Labs    03/01/24 1507 03/01/24 2223 03/02/24 0252 03/03/24 0401 03/04/24 0244  NA 135  --  135 135 137  K 2.9*  --  3.4* 3.6 4.0  CL 95*  --  102 101 101  CO2 25  --  16* 18* 18*  GLUCOSE 105*  --  87 94 92  BUN 70*  --  74* 79* 88*  CALCIUM  8.7*  --  8.0* 8.4* 8.4*  CREATININE 6.00* 5.87* 5.97* 6.90* 7.41*  GFRNONAA 9* 9* 9* 8* 7*    LIVER FUNCTION TESTS: Recent Labs    03/01/24 1507 03/02/24 0252 03/03/24 0401 03/04/24 0244  BILITOT 2.5* 2.1* 2.1* 1.7*  AST 57* 61* 45* 31  ALT 40 39 34 27  ALKPHOS 148* 142* 139* 108  PROT 6.4* 5.9* 6.3* 6.1*  ALBUMIN 3.2* 2.6* 2.8*  2.8* 2.7*    Assessment and Plan:  78 y/o M with history of acute cholecystitis s/p percutaneous cholecystostomy placement in IR 03/02/24 seen today for drain follow up. Output on exam today is dark bilious, flushes without difficulty, insertion site unremarkable, suture and stat lock are in tact. Preliminary culture (+) abundant gram negative rods.   Drain Location:  RUQ Size: Fr size: 10 Fr Date of placement: 03/02/24  Currently to: Drain collection device: gravity 24 hour output:  Output by Drain (mL) 03/02/24 0701 - 03/02/24 1900 03/02/24 1901 - 03/03/24 0700 03/03/24 0701 - 03/03/24 1900 03/03/24 1901 - 03/04/24 0700 03/04/24 0701 - 03/04/24 1358  Closed System Drain 1 Lateral RUQ Other (Comment) 10.2 Fr.    150    Interval imaging/drain manipulation:  None  Current examination: Flushes/aspirates easily.  Insertion site unremarkable. Suture and stat lock in place. Dressed appropriately.   Plan: Continue TID flushes with 5 cc NS. Record output Q shift. Dressing changes QD or PRN if soiled.  Call IR APP or on call IR MD if difficulty flushing or sudden change in drain output.  Repeat imaging/possible drain injection once output < 10 mL/QD (excluding flush material). Consideration for drain removal if output is < 10 mL/QD (excluding flush material), pending discussion with the providing surgical service.  Discharge planning: Please contact IR APP or on call IR MD prior to patient d/c to ensure appropriate follow up plans are in place. Typically patient will follow up with IR clinic 10-14 days post d/c for repeat imaging/possible drain injection. IR scheduler will contact patient with date/time of appointment. Patient will need to flush drain QD with 5 cc NS, record output QD, dressing changes every 2-3 days or earlier if soiled.   IR will continue to  follow - please call with questions or concerns.  - Discussed temporary HD catheter placement planned for 12/23 in IR, he understands and is agreeable to proceed. Consent signed and in IR.   Electronically Signed: Clotilda DELENA Hesselbach, PA-C 03/04/2024, 1:54 PM   I spent a total of 15 Minutes at the the patient's bedside AND on the patient's hospital floor or unit, greater than 50% of which was counseling/coordinating care for acute cholecystitis.  "

## 2024-03-04 NOTE — Plan of Care (Signed)
   Problem: Education: Goal: Knowledge of General Education information will improve Description: Including pain rating scale, medication(s)/side effects and non-pharmacologic comfort measures Outcome: Progressing   Problem: Clinical Measurements: Goal: Respiratory complications will improve Outcome: Progressing   Problem: Nutrition: Goal: Adequate nutrition will be maintained Outcome: Progressing

## 2024-03-04 NOTE — Progress Notes (Signed)
 "  Progress Note     Subjective: Denies pain. Had some nausea. Reports episodes of emesis yesterday twice. Has not had BM or flatulence since procedure.  ROS  All negative with the exception of above.   Objective: Vital signs in last 24 hours: Temp:  [97.4 F (36.3 C)-98 F (36.7 C)] 97.5 F (36.4 C) (12/22 0802) Pulse Rate:  [72-93] 78 (12/22 0802) Resp:  [18-20] 18 (12/22 0802) BP: (81-140)/(54-73) 114/60 (12/22 0802) SpO2:  [90 %-95 %] 95 % (12/22 0802) Last BM Date : 03/03/24  Intake/Output from previous day: 12/21 0701 - 12/22 0700 In: 1015 [I.V.:1015] Out: 470 [Urine:200; Drains:150; Blood:120] Intake/Output this shift: No intake/output data recorded.  PE: General: Pleasant male who is laying in bed in NAD. HEENT: Head is normocephalic, atraumatic. Sclera are noninjected. EOMI.  Heart: HR normal during encounter.  Lungs: Respiratory effort nonlabored on room air. Abd: Soft with some distention. NT. Right sided drain in place with brown thin liquid in collection bag. No rebound tenderness or guarding.  MS: Able to move all 4 extremities.  Skin: Warm and dry. Psych: A&Ox3 with an appropriate affect.    Lab Results:  Recent Labs    03/03/24 0401 03/04/24 0244  WBC 16.7* 12.8*  HGB 14.4 14.0  HCT 42.0 41.8  PLT 92* 95*   BMET Recent Labs    03/03/24 0401 03/04/24 0244  NA 135 137  K 3.6 4.0  CL 101 101  CO2 18* 18*  GLUCOSE 94 92  BUN 79* 88*  CREATININE 6.90* 7.41*  CALCIUM  8.4* 8.4*   PT/INR Recent Labs    03/02/24 1338  LABPROT 14.3  INR 1.0   CMP     Component Value Date/Time   NA 137 03/04/2024 0244   K 4.0 03/04/2024 0244   CL 101 03/04/2024 0244   CO2 18 (L) 03/04/2024 0244   GLUCOSE 92 03/04/2024 0244   BUN 88 (H) 03/04/2024 0244   CREATININE 7.41 (H) 03/04/2024 0244   CREATININE 1.07 10/24/2019 1402   CALCIUM  8.4 (L) 03/04/2024 0244   PROT 6.1 (L) 03/04/2024 0244   ALBUMIN 2.7 (L) 03/04/2024 0244   AST 31 03/04/2024 0244    ALT 27 03/04/2024 0244   ALKPHOS 108 03/04/2024 0244   BILITOT 1.7 (H) 03/04/2024 0244   GFRNONAA 7 (L) 03/04/2024 0244   GFRAA >60 11/29/2019 1344   Lipase     Component Value Date/Time   LIPASE 97 (H) 03/01/2024 1507       Studies/Results: IR Perc Cholecystostomy Result Date: 03/03/2024 INDICATION: Acute cholecystitis, non operative candidate EXAM: ULTRASOUND FLUOROSCOPIC PERCUTANEOUS TRANSHEPATIC CHOLECYSTOSTOMY MEDICATIONS: Patient is already receiving IV antibiotics as an inpatient ANESTHESIA/SEDATION: Moderate (conscious) sedation was employed during this procedure. A total of Versed  1.0 mg and Fentanyl  50 mcg was administered intravenously by the radiology nurse. Total intra-service moderate Sedation Time: 10 minutes. The patient's level of consciousness and vital signs were monitored continuously by radiology nursing throughout the procedure under my direct supervision. FLUOROSCOPY: Radiation Exposure Index (as provided by the fluoroscopic device): 1.4 mGy Kerma COMPLICATIONS: None immediate. PROCEDURE: Informed written consent was obtained from the patient after a thorough discussion of the procedural risks, benefits and alternatives. All questions were addressed. Maximal Sterile Barrier Technique was utilized including caps, mask, sterile gowns, sterile gloves, sterile drape, hand hygiene and skin antiseptic. A timeout was performed prior to the initiation of the procedure. Previous imaging reviewed. Preliminary ultrasound performed. The distended gallbladder was localized in the right upper  quadrant. Lower intercostal space marked. Under sterile conditions and local anesthesia, percutaneous needle access performed through a transhepatic window with an 18 gauge 15 cm trocar needle. Images obtained for documentation. There was return of blood tinged bile. Sample sent for culture. Guidewire inserted followed by tract dilatation to insert a 10 French drain. Drain catheter position  confirmed with ultrasound and fluoroscopy. Images again obtained for documentation. Gallbladder decompressed by syringe aspiration removing 80 cc. Catheter secured with a Prolene suture and a sterile dressing. Gravity drainage bag connected. No immediate complication. Patient tolerated the procedure well. IMPRESSION: Successful ultrasound and fluoroscopic percutaneous transhepatic cholecystostomy Electronically Signed   By: CHRISTELLA.  Shick M.D.   On: 03/03/2024 09:28    Anti-infectives: Anti-infectives (From admission, onward)    Start     Dose/Rate Route Frequency Ordered Stop   03/02/24 1000  piperacillin -tazobactam (ZOSYN ) IVPB 3.375 g        3.375 g 12.5 mL/hr over 240 Minutes Intravenous Every 12 hours 03/01/24 1914     03/01/24 1715  piperacillin -tazobactam (ZOSYN ) IVPB 3.375 g        3.375 g 12.5 mL/hr over 240 Minutes Intravenous  Once 03/01/24 1713 03/01/24 2149        Assessment/Plan Cholelithiasis with cholecystitis  -Afebrile -WBC 12.8 from 16.7 -Tbili 1.7 and AST/ALT have decreased. -US  and fluoro cholecystostomy completed 03/03/2024 by IR. Culture sent. -Output of drain noted to be 150 mL from 12/21-12/22. -Abdominal exam without pain. Some distention. Soft. Had some nausea and vomiting yesterday. No BM or flatulence since procedure. -Keep NPO due to nausea and vomiting episodes. -Continue abx. -Continued Cr rise 7.41 from 6.90. Plan for temporary dialysis catheter tomorrow and dialysis afterwards.  -Will continue to follow.   FEN: NPO ;LR VTE:heparin  injections ID: [id number]   AKI on CKD HAGMA Gastroenteritis PVS/NSVT Possible UTI Hypokalemia HTN Thrombocytopenia Left renal mass HLD    LOS: 3 days   I reviewed critical care notes, consulting provider notes, specialist notes, hospitalist notes, last 24 h vitals and pain scores, last 48 h intake and output, last 24 h labs and trends, and last 24 h imaging results.  This care required moderate level of medical  decision making.    Marjorie Carlyon Favre, [facility] 03/04/2024, 9:37 AM Please see Amion for pager number during day hours 7:00am-4:30pm  "

## 2024-03-04 NOTE — Progress Notes (Signed)
 " [facility] Progress Note    Assessment/ Plan:   AKI on CKD3a, oliguric -baseline Cr around 1.1-1.3 with AKI likely related to ATN - Has had some dehydration with attempted IV fluids but creatinine continues to rise - Nausea and vomiting likely not uremic in nature but hard to say definitively -Given ongoing rise of creatinine and oliguria I think we should consult radiology for temporary dialysis catheter tomorrow and plans for dialysis afterwards; likely HD again on 12/24 then monitor for further recovery -I am hopeful the patient will have renal recovery given good baseline creatinine and persistent urine output -can finish out IVFs   Acute cholecystitis -per primary service. Surgery following -s/p choley drain 12/21 with IR   Sepsis -secondary to gastroenteritis +/- cholecystitis - On antibiotics - Per primary   N/V/D -per primary service, receiving fluids   Left renal mass -recommend MRI with and without contrast as outpatient. If here longer than expected then can be done prior to discharge, will need to see urology if mass is suspicious for RCC   AGMA -secondary to AKI no concerns   HTN -BP soft, hold anti-HTNs -fluids as above   Thrombocytopenia -work up per primary    Subjective:   Still having abdominal pain but overall seems improved.  Some nausea and vomiting.  Low urine output   Objective:   BP 114/60 (BP Location: Right Arm)   Pulse 78   Temp (!) 97.5 F (36.4 C) (Oral)   Resp 18   Ht 6' 1 (1.854 m)   Wt 86.7 kg   SpO2 95%   BMI 25.21 kg/m   Intake/Output Summary (Last 24 hours) at 03/04/2024 9088 Last data filed at 03/04/2024 0458 Gross per 24 hour  Intake 1015 ml  Output 250 ml  Net 765 ml   Weight change:   Physical Exam: Gen: Lying in bed in no distress CVS: Normal rate, no rub Resp: Bilateral chest rise with no increased work of breathing Abd: +choley drain, tenderness to palpation Ext: no edema Neuro: awake,  alert  Imaging: IR Perc Cholecystostomy Result Date: 03/03/2024 INDICATION: Acute cholecystitis, non operative candidate EXAM: ULTRASOUND FLUOROSCOPIC PERCUTANEOUS TRANSHEPATIC CHOLECYSTOSTOMY MEDICATIONS: Patient is already receiving IV antibiotics as an inpatient ANESTHESIA/SEDATION: Moderate (conscious) sedation was employed during this procedure. A total of Versed  1.0 mg and Fentanyl  50 mcg was administered intravenously by the radiology nurse. Total intra-service moderate Sedation Time: 10 minutes. The patient's level of consciousness and vital signs were monitored continuously by radiology nursing throughout the procedure under my direct supervision. FLUOROSCOPY: Radiation Exposure Index (as provided by the fluoroscopic device): 1.4 mGy Kerma COMPLICATIONS: None immediate. PROCEDURE: Informed written consent was obtained from the patient after a thorough discussion of the procedural risks, benefits and alternatives. All questions were addressed. Maximal Sterile Barrier Technique was utilized including caps, mask, sterile gowns, sterile gloves, sterile drape, hand hygiene and skin antiseptic. A timeout was performed prior to the initiation of the procedure. Previous imaging reviewed. Preliminary ultrasound performed. The distended gallbladder was localized in the right upper quadrant. Lower intercostal space marked. Under sterile conditions and local anesthesia, percutaneous needle access performed through a transhepatic window with an 18 gauge 15 cm trocar needle. Images obtained for documentation. There was return of blood tinged bile. Sample sent for culture. Guidewire inserted followed by tract dilatation to insert a 10 French drain. Drain catheter position confirmed with ultrasound and fluoroscopy. Images again obtained for documentation. Gallbladder decompressed by syringe aspiration removing 80 cc. Catheter  secured with a Prolene suture and a sterile dressing. Gravity drainage bag connected. No  immediate complication. Patient tolerated the procedure well. IMPRESSION: Successful ultrasound and fluoroscopic percutaneous transhepatic cholecystostomy Electronically Signed   By: CHRISTELLA.  Shick M.D.   On: 03/03/2024 09:28    Labs: BMET Recent Labs  Lab 03/01/24 1507 03/01/24 2223 03/02/24 0252 03/03/24 0401 03/04/24 0244  NA 135  --  135 135 137  K 2.9*  --  3.4* 3.6 4.0  CL 95*  --  102 101 101  CO2 25  --  16* 18* 18*  GLUCOSE 105*  --  87 94 92  BUN 70*  --  74* 79* 88*  CREATININE 6.00* 5.87* 5.97* 6.90* 7.41*  CALCIUM  8.7*  --  8.0* 8.4* 8.4*  PHOS  --   --   --  3.3  --    CBC Recent Labs  Lab 03/01/24 2223 03/02/24 0252 03/03/24 0401 03/04/24 0244  WBC 19.8* 19.3* 16.7* 12.8*  HGB 14.8 14.4 14.4 14.0  HCT 43.2 41.6 42.0 41.8  MCV 85.9 85.1 85.2 86.7  PLT 86* 81* 92* 95*    Medications:     heparin   5,000 Units Subcutaneous Q8H   lidocaine   1 patch Transdermal Q24H   sodium chloride  flush  5 mL Intracatheter Q8H      "

## 2024-03-05 ENCOUNTER — Inpatient Hospital Stay (HOSPITAL_COMMUNITY)

## 2024-03-05 DIAGNOSIS — N179 Acute kidney failure, unspecified: Secondary | ICD-10-CM | POA: Diagnosis not present

## 2024-03-05 DIAGNOSIS — A419 Sepsis, unspecified organism: Secondary | ICD-10-CM | POA: Diagnosis not present

## 2024-03-05 DIAGNOSIS — J9601 Acute respiratory failure with hypoxia: Secondary | ICD-10-CM | POA: Diagnosis not present

## 2024-03-05 DIAGNOSIS — R34 Anuria and oliguria: Secondary | ICD-10-CM | POA: Diagnosis not present

## 2024-03-05 DIAGNOSIS — R6520 Severe sepsis without septic shock: Secondary | ICD-10-CM | POA: Diagnosis not present

## 2024-03-05 DIAGNOSIS — K810 Acute cholecystitis: Secondary | ICD-10-CM | POA: Diagnosis not present

## 2024-03-05 HISTORY — PX: IR TUNNELED CENTRAL VENOUS CATH PLC W IMG: IMG1939

## 2024-03-05 LAB — HEPATIC FUNCTION PANEL
ALT: 20 U/L (ref 0–44)
AST: 25 U/L (ref 15–41)
Albumin: 2.5 g/dL — ABNORMAL LOW (ref 3.5–5.0)
Alkaline Phosphatase: 87 U/L (ref 38–126)
Bilirubin, Direct: 0.8 mg/dL — ABNORMAL HIGH (ref 0.0–0.2)
Indirect Bilirubin: 0.5 mg/dL (ref 0.3–0.9)
Total Bilirubin: 1.3 mg/dL — ABNORMAL HIGH (ref 0.0–1.2)
Total Protein: 5.8 g/dL — ABNORMAL LOW (ref 6.5–8.1)

## 2024-03-05 LAB — CBC
HCT: 38.3 % — ABNORMAL LOW (ref 39.0–52.0)
Hemoglobin: 12.7 g/dL — ABNORMAL LOW (ref 13.0–17.0)
MCH: 29.5 pg (ref 26.0–34.0)
MCHC: 33.2 g/dL (ref 30.0–36.0)
MCV: 88.9 fL (ref 80.0–100.0)
Platelets: 100 K/uL — ABNORMAL LOW (ref 150–400)
RBC: 4.31 MIL/uL (ref 4.22–5.81)
RDW: 15.5 % (ref 11.5–15.5)
WBC: 9 K/uL (ref 4.0–10.5)
nRBC: 0 % (ref 0.0–0.2)

## 2024-03-05 LAB — RENAL FUNCTION PANEL
Albumin: 2.5 g/dL — ABNORMAL LOW (ref 3.5–5.0)
Anion gap: 19 — ABNORMAL HIGH (ref 5–15)
BUN: 96 mg/dL — ABNORMAL HIGH (ref 8–23)
CO2: 14 mmol/L — ABNORMAL LOW (ref 22–32)
Calcium: 7.9 mg/dL — ABNORMAL LOW (ref 8.9–10.3)
Chloride: 103 mmol/L (ref 98–111)
Creatinine, Ser: 7.94 mg/dL — ABNORMAL HIGH (ref 0.61–1.24)
GFR, Estimated: 6 mL/min — ABNORMAL LOW
Glucose, Bld: 77 mg/dL (ref 70–99)
Phosphorus: 5.7 mg/dL — ABNORMAL HIGH (ref 2.5–4.6)
Potassium: 4 mmol/L (ref 3.5–5.1)
Sodium: 136 mmol/L (ref 135–145)

## 2024-03-05 LAB — BLOOD GAS, VENOUS
Acid-base deficit: 11.2 mmol/L — ABNORMAL HIGH (ref 0.0–2.0)
Bicarbonate: 16.4 mmol/L — ABNORMAL LOW (ref 20.0–28.0)
O2 Saturation: 84.1 %
Patient temperature: 37
pCO2, Ven: 42 mmHg — ABNORMAL LOW (ref 44–60)
pH, Ven: 7.2 — ABNORMAL LOW (ref 7.25–7.43)
pO2, Ven: 54 mmHg — ABNORMAL HIGH (ref 32–45)

## 2024-03-05 LAB — HEPATITIS B SURFACE ANTIBODY, QUANTITATIVE: Hep B S AB Quant (Post): <3.5 m[IU]/mL — ABNORMAL LOW

## 2024-03-05 MED ORDER — HEPARIN SODIUM (PORCINE) 1000 UNIT/ML IJ SOLN
INTRAMUSCULAR | Status: AC
  Filled 2024-03-05: qty 10

## 2024-03-05 MED ORDER — LIDOCAINE HCL (PF) 1 % IJ SOLN: 5.0000 mL | INTRAMUSCULAR | Status: DC | PRN

## 2024-03-05 MED ORDER — HEPARIN SODIUM (PORCINE) 1000 UNIT/ML DIALYSIS: 1000.0000 [IU] | INTRAMUSCULAR | Status: DC | PRN

## 2024-03-05 MED ORDER — PENTAFLUOROPROP-TETRAFLUOROETH EX AERO: 1.0000 | INHALATION_SPRAY | CUTANEOUS | Status: DC | PRN

## 2024-03-05 MED ORDER — LIDOCAINE HCL 1 % IJ SOLN
INTRAMUSCULAR | Status: AC
  Filled 2024-03-05: qty 20

## 2024-03-05 MED ORDER — LIDOCAINE-PRILOCAINE 2.5-2.5 % EX CREA: 1.0000 | TOPICAL_CREAM | CUTANEOUS | Status: DC | PRN

## 2024-03-05 MED ORDER — HEPARIN SODIUM (PORCINE) 1000 UNIT/ML IJ SOLN
INTRAMUSCULAR | Status: AC
  Filled 2024-03-05: qty 2

## 2024-03-05 MED ORDER — LIDOCAINE HCL 1 % IJ SOLN
20.0000 mL | Freq: Once | INTRAMUSCULAR | Status: AC
  Administered 2024-03-05: 10 mL

## 2024-03-05 MED ORDER — ALTEPLASE 2 MG IJ SOLR: 2.0000 mg | Freq: Once | INTRAMUSCULAR | Status: DC | PRN

## 2024-03-05 NOTE — Progress Notes (Signed)
 " PROGRESS NOTE    [name]  FMW:[id number] DOB: [birth year] DOA: 03/01/2024 PCP: Kennyth Worth HERO, MD    Brief Narrative:  Patient is a 78 year old male with PMHx of HTN and HLD who presented to the ED on 03/01/2024 with several days of nausea, vomiting, and diarrhea.  Symptoms began on 12/14 after returning from church and eating a frozen [employer] chicken meal, followed about an hour later by chills/subjective fevers with rigors, then profuse emesis and subsequent multiple episodes of loose watery stools.  Emesis progressed from food contents to watery, nonbloody.  Stools watery without blood or mucus.  He reports poor p.o. intake since Sunday and generalized, nonfocal abdominal discomfort.  Also notes markedly decreased urine output over the past 2 days with dark orange urine, without gross hematuria.  In the ED he was found to have AKI and hypokalemia, with CT abdomen findings concerning for acute cholecystitis.  He was admitted to the hospital service for further management.  Assessment and Plan:  AKI on CKD stage IIIa  Oliguria - Likely prerenal in the setting of decreased PO intake and increased GI losses with concomitant Losartan  use. Nephrology suspecting ATN given no obstruction.  - Cr worsening to 7.9, baseline ~ 1.1-1.3 - Currently producing very little urine, 350 mL out yesterday. - Scheduled for temp HD catheter placement with IR today - Nephrology following, plan to start HD following HD catheter placement - Strict I&O, bladder scan q8h  Acute hypoxic respiratory failure secondary to volume overload - resolved - Patient not on O2 at baseline with no known history of CHF. Currently net +5.1L. Patient became tachypneic and hypoxic to 88% on RA likely secondary to volume overload on 12/22. Improved to 93-97% on 2L  Bend. IVFs stopped. - ProBNP elevated to 18,000 - Echo (12/22) showed LVEF 55-60% with no RWMA, mild concentric LVH, G1DD, normal RV function, no valvular  abnormalities - CXR elevated R hemidiaphragm with R basilar opacity (atelectasis vs PNA) and L basilar atelectasis, rounded R paratracheal density (tortuous vessel vs adenopathy), recommended CT chest w/ contrast. No effusion or PTX.  - Weaned down to RA, maintaining appropriate SpO2. - Volume will be managed with HD today once temp HD cath is placed  HAGMA - Bicarb 14, AG corrected for albumin 22.8, delta ratio 1.1 indicating uncomplicated HAGMA - Lactic acid wnl - Likely secondary to worsening acute renal failure - Will be addressed with HD hopefully today - Nephrology following  Sepsis Intermittent hypotension - resolved - Met SIRS criteria on admission with tachycardia, tachypnea, leukocytosis, likely secondary to cholecystitis, gastroenteritis and/or acute cystitis - Afebrile, leukocytosis improving - Blood cultures NGTD - Urine culture showed no growth - Continue IV Zosyn  - PCCM consulted on 12/21 given intermittent hypotension in the setting of AKI and sepsis - no ICU needs at the time.   Acute cholecystitis - Has RUQ pain on palpation with CT evidence of acute cholecystitis without CBD dilation - General surgery following, deferred cholecystectomy until after placement of PCT and further clinical improvement  - Underwent PCT placement by IR on 12/21 - Bile culture pending, gram stain showing GNR - Afebrile, leukocytosis resolved, T. Bili trending down - Continue IV Zosyn   Gastroenteritis - Patient presented with N/V and diarrhea which started after eating frozen meal - No further episodes of diarrhea, nausea, or vomiting.  Tolerating CLD today. - GIPP and C diff PCR negative - Continue conservative management - Advance diet as tolerated - PRN Zofran   - PRN imodium   Hyperphosphatemia - Phos 5.7 - Secondary acute renal failure - Will be addressed with HD  PVCs  NSVT - Tele alerted to multiple runs of VT on 12/21 during which patient was asymptomatic and HDS. -  Patient with known history of high PVC burden. - Cardiology consulted on 12/21, determined NSVT with elevated burden of PVC worsened by ongoing acute illness, recommended echo and electrolyte replacement - Echo without concerning findings, as noted above - Continue Toprol -XL 25 mg (note patient's home prescription is for 25 mg daily but he reports taking 12.5 mg daily)  UTI ruled out - Urine culture showed no growth  Hypokalemia - resolved  Thrombocytopenia - Platelets decreased but relatively stable - No overt signs/symptoms of bleeding - Likely reactive to ongoing acute illness - No indication for platelet transfusion at this time - Continue to monitor  HTN - BP currently low-normal  - Continue holding home Losartan  (also in setting of AKI) - Continue Troprol-XL 25 mg daily  Left renal mass - CT abd/pel showed soft tissue mass on left kidney - Will need MRI with contrast per renal mass protocol at discharge  Transaminitis - resolved Direct Hyperbilirubinemia - improving -  AST, ALT, and ALP normalized - T. Bili improving - Possibly related to acute illness in the setting of cholecystitis  HLD - Statin held due to transaminits  DVT prophylaxis: heparin  injection 5,000 Units Start: 03/01/24 2215   Code Status:   Code Status: Full Code  Family Communication: none  Disposition Plan: Pending clinical improvement, AKI, cholecystitis PT - Follow Up Recommendations: No PT follow up - PT equipment: None recommended by PT OT -   -    Level of care: Progressive  Consultants:  Nephrology, general surgery, IR  Procedures:  PCT placement by IR - 12/21  Antimicrobials: IV Zosyn    Subjective: Examined at bedside.  Feels much better today.  Saw him after he got his temporary HD cath placed, which she tolerated well.   Was able to tolerate clear liquid diet without issue.  Reports abdominal pain has improved, still has soreness at the insertion site of the PCT.  Continues to  have minimal urine output.  He is also happy about having a new bed that he can adjust to get comfortable.    Objective: Vitals:   03/04/24 1704 03/04/24 1900 03/04/24 2300 03/05/24 0300  BP: 107/70 114/66 133/66 116/73  Pulse: 69 69  70  Resp: 17 18 18 18   Temp: (!) 97.3 F (36.3 C) (!) 97.4 F (36.3 C) (!) 97.4 F (36.3 C) (!) 97.5 F (36.4 C)  TempSrc: Axillary Oral Oral Oral  SpO2: 97% 95%  92%  Weight:      Height:        Intake/Output Summary (Last 24 hours) at 03/05/2024 0551 Last data filed at 03/05/2024 0500 Gross per 24 hour  Intake 887.01 ml  Output 475 ml  Net 412.01 ml   Filed Weights   03/01/24 1505 03/01/24 2109  Weight: 88.3 kg 86.7 kg    Examination:  Gen: NAD, A&Ox3, appears uncomfortable HEENT: NCAT, EOMI Neck: Supple, no JVD, RIJ temp HD cath in place CV: tachycardic, regular rhythm, no murmurs Resp: normal WOB, bilateral lower lobe crackles Abd: Soft, RUQ PCT drain in place with dark green contents, abdomen mildly tender, nondistended Ext: Trace pedal edema Skin: Warm, dry, no rashes/lesions Neuro: No focal deficits Psych: Calm, cooperative, appropriate affect   Data Reviewed: I have personally reviewed following labs and imaging studies  CBC: Recent Labs  Lab 03/01/24 2223 03/02/24 0252 03/03/24 0401 03/04/24 0244 03/05/24 0344  WBC 19.8* 19.3* 16.7* 12.8* 9.0  HGB 14.8 14.4 14.4 14.0 12.7*  HCT 43.2 41.6 42.0 41.8 38.3*  MCV 85.9 85.1 85.2 86.7 88.9  PLT 86* 81* 92* 95* 100*   Basic Metabolic Panel: Recent Labs  Lab 03/01/24 1507 03/01/24 1508 03/01/24 2223 03/02/24 0252 03/03/24 0401 03/03/24 1324 03/04/24 0244 03/05/24 0345  NA 135  --   --  135 135  --  137 136  K 2.9*  --   --  3.4* 3.6  --  4.0 4.0  CL 95*  --   --  102 101  --  101 103  CO2 25  --   --  16* 18*  --  18* 14*  GLUCOSE 105*  --   --  87 94  --  92 77  BUN 70*  --   --  74* 79*  --  88* 96*  CREATININE 6.00*  --  5.87* 5.97* 6.90*  --  7.41*  7.94*  CALCIUM  8.7*  --   --  8.0* 8.4*  --  8.4* 7.9*  MG  --  2.5*  --  2.2  --  2.0  --   --   PHOS  --   --   --   --  3.3  --   --  5.7*   GFR: Estimated Creatinine Clearance: 8.9 mL/min (A) (by C-G formula based on SCr of 7.94 mg/dL (H)). Liver Function Tests: Recent Labs  Lab 03/01/24 1507 03/02/24 0252 03/03/24 0401 03/04/24 0244 03/05/24 0344 03/05/24 0345  AST 57* 61* 45* 31 25  --   ALT 40 39 34 27 20  --   ALKPHOS 148* 142* 139* 108 87  --   BILITOT 2.5* 2.1* 2.1* 1.7* 1.3*  --   PROT 6.4* 5.9* 6.3* 6.1* 5.8*  --   ALBUMIN 3.2* 2.6* 2.8*  2.8* 2.7* 2.5* 2.5*   Recent Labs  Lab 03/01/24 1507  LIPASE 97*   No results for input(s): AMMONIA in the last 168 hours. Coagulation Profile: Recent Labs  Lab 03/02/24 1338  INR 1.0   Cardiac Enzymes: No results for input(s): CKTOTAL, CKMB, CKMBINDEX, TROPONINI in the last 168 hours. BNP (last 3 results) Recent Labs    03/04/24 1129  PROBNP 18,836.0*   HbA1C: No results for input(s): HGBA1C in the last 72 hours. CBG: No results for input(s): GLUCAP in the last 168 hours. Lipid Profile: No results for input(s): CHOL, HDL, LDLCALC, TRIG, CHOLHDL, LDLDIRECT in the last 72 hours. Thyroid  Function Tests: Recent Labs    03/03/24 1324  TSH 3.710   Anemia Panel: No results for input(s): VITAMINB12, FOLATE, FERRITIN, TIBC, IRON, RETICCTPCT in the last 72 hours. Sepsis Labs: Recent Labs  Lab 03/01/24 1741 03/01/24 1940  LATICACIDVEN 1.5 1.3    Recent Results (from the past 240 hours)  Resp panel by RT-PCR (RSV, Flu A&B, Covid) Anterior Nasal Swab     Status: None   Collection Time: 03/01/24  3:08 PM   Specimen: Anterior Nasal Swab  Result Value Ref Range Status   SARS Coronavirus 2 by RT PCR NEGATIVE NEGATIVE Final    Comment: (NOTE) SARS-CoV-2 target nucleic acids are NOT DETECTED.  The SARS-CoV-2 RNA is generally detectable in upper respiratory specimens during  the acute phase of infection. The lowest concentration of SARS-CoV-2 viral copies this assay can detect is 138 copies/mL. A negative result  does not preclude SARS-Cov-2 infection and should not be used as the sole basis for treatment or other patient management decisions. A negative result may occur with  improper specimen collection/handling, submission of specimen other than nasopharyngeal swab, presence of viral mutation(s) within the areas targeted by this assay, and inadequate number of viral copies(<138 copies/mL). A negative result must be combined with clinical observations, patient history, and epidemiological information. The expected result is Negative.  Fact Sheet for Patients:  bloggercourse.com  Fact Sheet for Healthcare Providers:  seriousbroker.it  This test is no t yet approved or cleared by the [place]  FDA and  has been authorized for detection and/or diagnosis of SARS-CoV-2 by FDA under an Emergency Use Authorization (EUA). This EUA will remain  in effect (meaning this test can be used) for the duration of the COVID-19 declaration under Section 564(b)(1) of the Act, 21 U.S.C.section 360bbb-3(b)(1), unless the authorization is terminated  or revoked sooner.       Influenza A by PCR NEGATIVE NEGATIVE Final   Influenza B by PCR NEGATIVE NEGATIVE Final    Comment: (NOTE) The Xpert Xpress SARS-CoV-2/FLU/RSV plus assay is intended as an aid in the diagnosis of influenza from Nasopharyngeal swab specimens and should not be used as a sole basis for treatment. Nasal washings and aspirates are unacceptable for Xpert Xpress SARS-CoV-2/FLU/RSV testing.  Fact Sheet for Patients: bloggercourse.com  Fact Sheet for Healthcare Providers: seriousbroker.it  This test is not yet approved or cleared by the [place]  FDA and has been authorized for detection and/or  diagnosis of SARS-CoV-2 by FDA under an Emergency Use Authorization (EUA). This EUA will remain in effect (meaning this test can be used) for the duration of the COVID-19 declaration under Section 564(b)(1) of the Act, 21 U.S.C. section 360bbb-3(b)(1), unless the authorization is terminated or revoked.     Resp Syncytial Virus by PCR NEGATIVE NEGATIVE Final    Comment: (NOTE) Fact Sheet for Patients: bloggercourse.com  Fact Sheet for Healthcare Providers: seriousbroker.it  This test is not yet approved or cleared by the [place]  FDA and has been authorized for detection and/or diagnosis of SARS-CoV-2 by FDA under an Emergency Use Authorization (EUA). This EUA will remain in effect (meaning this test can be used) for the duration of the COVID-19 declaration under Section 564(b)(1) of the Act, 21 U.S.C. section 360bbb-3(b)(1), unless the authorization is terminated or revoked.  Performed at [facility], [address] [place], [place] [address]   Blood culture (routine x 2)     Status: None (Preliminary result)   Collection Time: 03/01/24  5:25 PM   Specimen: BLOOD  Result Value Ref Range Status   Specimen Description   Final    BLOOD RIGHT ANTECUBITAL Performed at [facility], [address] [place], [place] [address]    Special Requests   Final    BOTTLES DRAWN AEROBIC AND ANAEROBIC Blood Culture adequate volume Performed at [facility], [address] [place], [place] [address]    Culture   Final    NO GROWTH 3 DAYS Performed at [facility], [address], [place], [place] [address]    Report Status PENDING  Incomplete  Blood culture (routine x 2)     Status: None (Preliminary result)   Collection Time: 03/01/24  5:41 PM   Specimen: BLOOD RIGHT FOREARM  Result Value Ref Range Status   Specimen Description   Final    BLOOD RIGHT  FOREARM Performed at [facility], [address] [place], [place] [address]    Special Requests   Final    BOTTLES DRAWN AEROBIC AND ANAEROBIC Blood Culture adequate volume Performed at [facility], [address] [place], [place] [address]    Culture   Final    NO GROWTH 3 DAYS Performed at [facility], [address] [place], [place] [address]    Report Status PENDING  Incomplete  Gastrointestinal Panel by PCR , Stool     Status: None   Collection Time: 03/01/24 10:05 PM   Specimen: Stool  Result Value Ref Range Status   Campylobacter species NOT DETECTED NOT DETECTED Final   Plesimonas shigelloides NOT DETECTED NOT DETECTED Final   Salmonella species NOT DETECTED NOT DETECTED Final   Yersinia enterocolitica NOT DETECTED NOT DETECTED Final   Vibrio species NOT DETECTED NOT DETECTED Final   Vibrio cholerae NOT DETECTED NOT DETECTED Final   Enteroaggregative E coli (EAEC) NOT DETECTED NOT DETECTED Final   Enteropathogenic E coli (EPEC) NOT DETECTED NOT DETECTED Final   Enterotoxigenic E coli (ETEC) NOT DETECTED NOT DETECTED Final   Shiga like toxin producing E coli (STEC) NOT DETECTED NOT DETECTED Final   Shigella/Enteroinvasive E coli (EIEC) NOT DETECTED NOT DETECTED Final   Cryptosporidium NOT DETECTED NOT DETECTED Final   Cyclospora cayetanensis NOT DETECTED NOT DETECTED Final   Entamoeba histolytica NOT DETECTED NOT DETECTED Final   Giardia lamblia NOT DETECTED NOT DETECTED Final   Adenovirus F40/41 NOT DETECTED NOT DETECTED Final   Astrovirus NOT DETECTED NOT DETECTED Final   Norovirus GI/GII NOT DETECTED NOT DETECTED Final   Rotavirus A NOT DETECTED NOT DETECTED Final   Sapovirus (I, II, IV, and V) NOT DETECTED NOT DETECTED Final    Comment: Performed at [facility], [address] [place], [place] [address]  C Difficile Quick Screen w PCR reflex     Status: None   Collection Time: 03/01/24 10:05 PM   Specimen: Stool  Result Value Ref Range Status   C Diff  antigen NEGATIVE NEGATIVE Final   C Diff toxin NEGATIVE NEGATIVE Final   C Diff interpretation No C. difficile detected.  Final    Comment: Performed at [facility], [address], [place], [place] [address]  Urine Culture (for pregnant, neutropenic or urologic patients or patients with an indwelling urinary catheter)     Status: None   Collection Time: 03/02/24 10:11 PM   Specimen: Urine, Clean Catch  Result Value Ref Range Status   Specimen Description URINE, CLEAN CATCH  Final   Special Requests NONE  Final   Culture   Final    NO GROWTH Performed at [facility], [address], [place], [place] [address]    Report Status 03/04/2024 FINAL  Final  Aerobic/Anaerobic Culture w Gram Stain (surgical/deep wound)     Status: None (Preliminary result)   Collection Time: 03/03/24  8:26 AM   Specimen: Abscess  Result Value Ref Range Status   Specimen Description ABSCESS  Final   Special Requests GALLBLADDER  Final   Gram Stain   Final    ABUNDANT WBC PRESENT, PREDOMINANTLY PMN RARE GRAM NEGATIVE RODS    Culture   Final    ABUNDANT GRAM NEGATIVE RODS CULTURE REINCUBATED FOR BETTER GROWTH SUSCEPTIBILITIES TO FOLLOW Performed at [facility], [address], [place], [place] [address]    Report Status PENDING  Incomplete     Radiology Studies: ECHOCARDIOGRAM COMPLETE Result Date: 03/04/2024    ECHOCARDIOGRAM REPORT   Patient Name:   VERSIE FLEENER Date of Exam: 03/04/2024 Medical Rec #:  [id number]          Height:       73.0 in Accession #:    [id number]         Weight:       191.1 lb Date of Birth:  [birth year]         BSA:          2.110 m Patient Age:    78 years           BP:           114/60 mmHg Patient Gender: M                  HR:           90 bpm. Exam Location:  Inpatient Procedure: 2D Echo, Cardiac Doppler and Color Doppler (Both Spectral and Color            Flow Doppler were utilized during procedure). Indications:    Ventricular Tachycardia  History:         Patient has prior history of Echocardiogram examinations, most                 recent 03/29/2022. Risk Factors:Hypertension and Dyslipidemia.  Sonographer:    Sherlean Dubin Referring Phys: [id number] MADONNA LARGE IMPRESSIONS  1. Left ventricular ejection fraction, by estimation, is 55 to 60%. The left ventricle has normal function. The left ventricle has no regional wall motion abnormalities. There is mild concentric left ventricular hypertrophy. Left ventricular diastolic parameters are consistent with Grade I diastolic dysfunction (impaired relaxation).  2. Right ventricular systolic function is normal. The right ventricular size is mildly enlarged.  3. Right atrial size was mildly dilated.  4. The mitral valve is normal in structure. No evidence of mitral valve regurgitation. No evidence of mitral stenosis.  5. The aortic valve is tricuspid. There is moderate calcification of the aortic valve. Aortic valve regurgitation is not visualized. Aortic valve sclerosis/calcification is present, without any evidence of aortic stenosis.  6. The inferior vena cava is normal in size with greater than 50% respiratory variability, suggesting right atrial pressure of 3 mmHg. FINDINGS  Left Ventricle: Left ventricular ejection fraction, by estimation, is 55 to 60%. The left ventricle has normal function. The left ventricle has no regional wall motion abnormalities. The left ventricular internal cavity size was normal in size. There is  mild concentric left ventricular hypertrophy. Left ventricular diastolic parameters are consistent with Grade I diastolic dysfunction (impaired relaxation). Right Ventricle: The right ventricular size is mildly enlarged. No increase in right ventricular wall thickness. Right ventricular systolic function is normal. Left Atrium: Left atrial size was normal in size. Right Atrium: Right atrial size was mildly dilated. Pericardium: There is no evidence of pericardial effusion. Mitral Valve: The  mitral valve is normal in structure. No evidence of mitral valve regurgitation. No evidence of mitral valve stenosis. Tricuspid Valve: The tricuspid valve is normal in structure. Tricuspid valve regurgitation is mild . No evidence of tricuspid stenosis. Aortic Valve: The aortic valve is tricuspid. There is moderate calcification of the aortic valve. Aortic valve regurgitation is not visualized. Aortic valve sclerosis/calcification is present, without any evidence of aortic stenosis. Aortic valve mean gradient measures 8.0 mmHg. Aortic valve peak gradient measures 14.0 mmHg. Aortic valve area, by  VTI measures 2.15 cm. Pulmonic Valve: The pulmonic valve was normal in structure. Pulmonic valve regurgitation is trivial. No evidence of pulmonic stenosis. Aorta: The aortic root is normal in size and structure. Venous: The inferior vena cava is normal in size with greater than 50% respiratory variability, suggesting right atrial pressure of 3 mmHg. IAS/Shunts: No atrial level shunt detected by color flow Doppler.  LEFT VENTRICLE PLAX 2D LVIDd:         5.10 cm   Diastology LVIDs:         3.50 cm   LV e' medial:    8.86 cm/s LV PW:         0.90 cm   LV E/e' medial:  6.8 LV IVS:        1.20 cm   LV e' lateral:   8.55 cm/s LVOT diam:     2.20 cm   LV E/e' lateral: 7.1 LV SV:         76 LV SV Index:   36 LVOT Area:     3.80 cm  RIGHT VENTRICLE             IVC RV Basal diam:  3.20 cm     IVC diam: 1.70 cm RV Mid diam:    2.70 cm RV S prime:     13.10 cm/s TAPSE (M-mode): 1.2 cm LEFT ATRIUM             Index        RIGHT ATRIUM           Index LA diam:        4.10 cm 1.94 cm/m   RA Area:     17.20 cm LA Vol (A2C):   46.7 ml 22.13 ml/m  RA Volume:   43.20 ml  20.47 ml/m LA Vol (A4C):   39.0 ml 18.48 ml/m LA Biplane Vol: 44.0 ml 20.85 ml/m  AORTIC VALVE AV Area (Vmax):    2.06 cm AV Area (Vmean):   2.07 cm AV Area (VTI):     2.15 cm AV Vmax:           187.00 cm/s AV Vmean:          128.500 cm/s AV VTI:            0.354 m  AV Peak Grad:      14.0 mmHg AV Mean Grad:      8.0 mmHg LVOT Vmax:         101.50 cm/s LVOT Vmean:        70.050 cm/s LVOT VTI:          0.200 m LVOT/AV VTI ratio: 0.56  AORTA Ao Root diam: 3.40 cm Ao Asc diam:  3.50 cm MITRAL VALVE               TRICUSPID VALVE MV Area (PHT): 4.15 cm    TR Peak grad:   27.5 mmHg MV Decel Time: 183 msec    TR Vmax:        262.00 cm/s MR Peak grad: 6.2 mmHg MR Vmax:      125.00 cm/s  SHUNTS MV E velocity: 60.40 cm/s  Systemic VTI:  0.20 m MV A velocity: 38.60 cm/s  Systemic Diam: 2.20 cm MV E/A ratio:  1.56 Toribio Fuel MD Electronically signed by Toribio Fuel MD Signature Date/Time: 03/04/2024/10:39:24 PM    Final    DG CHEST PORT 1 VIEW Result Date: 03/04/2024 CLINICAL DATA:  Shortness of breath. EXAM: PORTABLE  CHEST 1 VIEW COMPARISON:  Lung bases from abdominopelvic CT 03/01/2024 FINDINGS: Elevated right hemidiaphragm with patchy opacity at the right lung base. Streaky left basilar densities. The heart is upper normal in size. Aortic atherosclerosis. Rounded right paratracheal density. No pneumothorax or large pleural effusion. IMPRESSION: 1. Elevated right hemidiaphragm with patchy opacity at the right lung base, atelectasis versus pneumonia. 2. Streaky left basilar densities, favor atelectasis. 3. Rounded right paratracheal density, tortuous vasculature versus adenopathy. Recommend further evaluation with contrast-enhanced chest CT. Electronically Signed   By: Andrea Gasman M.D.   On: 03/04/2024 21:55   IR Perc Cholecystostomy Result Date: 03/03/2024 INDICATION: Acute cholecystitis, non operative candidate EXAM: ULTRASOUND FLUOROSCOPIC PERCUTANEOUS TRANSHEPATIC CHOLECYSTOSTOMY MEDICATIONS: Patient is already receiving IV antibiotics as an inpatient ANESTHESIA/SEDATION: Moderate (conscious) sedation was employed during this procedure. A total of Versed  1.0 mg and Fentanyl  50 mcg was administered intravenously by the radiology nurse. Total intra-service  moderate Sedation Time: 10 minutes. The patient's level of consciousness and vital signs were monitored continuously by radiology nursing throughout the procedure under my direct supervision. FLUOROSCOPY: Radiation Exposure Index (as provided by the fluoroscopic device): 1.4 mGy Kerma COMPLICATIONS: None immediate. PROCEDURE: Informed written consent was obtained from the patient after a thorough discussion of the procedural risks, benefits and alternatives. All questions were addressed. Maximal Sterile Barrier Technique was utilized including caps, mask, sterile gowns, sterile gloves, sterile drape, hand hygiene and skin antiseptic. A timeout was performed prior to the initiation of the procedure. Previous imaging reviewed. Preliminary ultrasound performed. The distended gallbladder was localized in the right upper quadrant. Lower intercostal space marked. Under sterile conditions and local anesthesia, percutaneous needle access performed through a transhepatic window with an 18 gauge 15 cm trocar needle. Images obtained for documentation. There was return of blood tinged bile. Sample sent for culture. Guidewire inserted followed by tract dilatation to insert a 10 French drain. Drain catheter position confirmed with ultrasound and fluoroscopy. Images again obtained for documentation. Gallbladder decompressed by syringe aspiration removing 80 cc. Catheter secured with a Prolene suture and a sterile dressing. Gravity drainage bag connected. No immediate complication. Patient tolerated the procedure well. IMPRESSION: Successful ultrasound and fluoroscopic percutaneous transhepatic cholecystostomy Electronically Signed   By: CHRISTELLA.  Shick M.D.   On: 03/03/2024 09:28    Scheduled Meds:  Chlorhexidine  Gluconate Cloth  6 each Topical Q0600   heparin   5,000 Units Subcutaneous Q8H   lidocaine   1 patch Transdermal Q24H   metoprolol  succinate  25 mg Oral Daily   sodium chloride  flush  5 mL Intracatheter Q8H   Continuous  Infusions:  piperacillin -tazobactam (ZOSYN )  IV 3.375 g (03/04/24 2211)     Unresulted Labs (From admission, onward)     Start     Ordered   03/05/24 0550  Blood gas, venous  Once,   R       Question:  Specimen collection method  Answer:  Lab=Lab collect   03/05/24 0549   03/05/24 0500  Renal function panel  Daily,   R     Question:  Specimen collection method  Answer:  Lab=Lab collect   03/04/24 0911             LOS:  LOS: 4 days   Time Spent: 45 minutes  Jazsmin Couse Al-Sultani, MD [facility]  If 7PM-7AM, please contact night-coverage  03/05/2024, 5:51 AM      "

## 2024-03-05 NOTE — Plan of Care (Signed)
   Problem: Education: Goal: Knowledge of General Education information will improve Description: Including pain rating scale, medication(s)/side effects and non-pharmacologic comfort measures Outcome: Progressing   Problem: Health Behavior/Discharge Planning: Goal: Ability to manage health-related needs will improve Outcome: Progressing   Problem: Clinical Measurements: Goal: Will remain free from infection Outcome: Progressing

## 2024-03-05 NOTE — Progress Notes (Signed)
" °   03/05/24 1629  Vitals  Temp (!) 97.4 F (36.3 C)  BP 129/88  MAP (mmHg) 90  BP Location Right Arm  BP Method Automatic  Patient Position (if appropriate) Lying  Pulse Rate 73  Pulse Rate Source Monitor  ECG Heart Rate 76  Resp 20  Oxygen Therapy  SpO2 97 %  O2 Device Nasal Cannula  O2 Flow Rate (L/min) 3 L/min  During Treatment Monitoring  Blood Flow Rate (mL/min) 199 mL/min  Arterial Pressure (mmHg) -42.02 mmHg  Venous Pressure (mmHg) 77.17 mmHg  TMP (mmHg) 7.07 mmHg  Ultrafiltration Rate (mL/min) 300 mL/min  Dialysate Flow Rate (mL/min) 299 ml/min  Dialysate Potassium Concentration 3  Dialysate Calcium  Concentration 2.5  Duration of HD Treatment -hour(s) 2 hour(s)  Cumulative Fluid Removed (mL) per Treatment  0.04  HD Safety Checks Performed Yes  Intra-Hemodialysis Comments Tx completed  Post Treatment  Dialyzer Clearance Lightly streaked  Hemodialysis Intake (mL) 0 mL  Liters Processed 30  Fluid Removed (mL) 0 mL  Tolerated HD Treatment Yes  Hemodialysis Catheter Right Internal jugular Triple lumen Temporary (Non-Tunneled)  Placement Date/Time: 03/05/24 0806   Serial / Lot #: [id number]  Expiration Date: 10/12/27  Time Out: Correct patient;Correct site;Correct procedure  Maximum sterile barrier precautions: Hand hygiene;Mask;Cap;Sterile gown;Sterile gloves;Large sterile s...  Site Condition No complications  Blue Lumen Status Flushed;Heparin  locked  Red Lumen Status Flushed;Heparin  locked  Purple Lumen Status N/A  Catheter fill solution Heparin  1000 units/ml  Catheter fill volume (Arterial) 1.3 cc  Catheter fill volume (Venous) 1.3  Dressing Type Gauze/Drain sponge  Dressing Status Antimicrobial disc/dressing in place  Drainage Description Serosanguineous   Received patient in bed to unit.  Alert and oriented.  Informed consent signed and in chart.   TX duration:  Patient tolerated well.  Transported back to the room  Alert, without acute distress.   Hand-off given to patient's nurse.   Access used: R IJ Access issues: none  Total UF removed: 0 Medication(s) given: none Post HD VS: see above Post HD weight: n/a   Docia CHRISTELLA [facility] "

## 2024-03-05 NOTE — Progress Notes (Addendum)
 " [facility] Progress Note    Assessment/ Plan:   AKI on CKD3a, oliguric -baseline Cr around 1.1-1.3 with AKI likely related to ATN - Has had some dehydration with attempted IV fluids but creatinine continues to rise - Nausea and vomiting likely not uremic in nature but hard to say definitively -Given ongoing rise of creatinine and oliguria I think we should proceed with dialysis today and likely second session tomorrow.  Then we will monitor for renal recovery -I am hopeful the patient will have renal recovery given good baseline creatinine and persistent urine output   Acute cholecystitis -per primary service. Surgery following -s/p choley drain 12/21 with IR   Sepsis -secondary to gastroenteritis +/- cholecystitis - On antibiotics - Per primary   N/V/D -per primary service, improved today   Left renal mass -recommend MRI with and without contrast as outpatient. If here longer than expected then can be done prior to discharge, will need to see urology if mass is suspicious for RCC   AGMA -secondary to AKI no concerns   HTN -BP soft, hold anti-HTNs   Thrombocytopenia -work up per primary    Subjective:   Patient states he feels fairly good today.  No nausea vomiting.  Able to eat some this morning and feeling okay.  Got temporary dialysis catheter this morning with no issues.  Creatinine and BUN up   Objective:   BP 115/69 (BP Location: Right Arm)   Pulse 74   Temp 97.6 F (36.4 C) (Oral)   Resp 20   Ht 6' 1 (1.854 m)   Wt 86.7 kg   SpO2 95%   BMI 25.21 kg/m   Intake/Output Summary (Last 24 hours) at 03/05/2024 1056 Last data filed at 03/05/2024 0858 Gross per 24 hour  Intake 893.01 ml  Output 550 ml  Net 343.01 ml   Weight change:   Physical Exam: Gen: Lying in bed in no distress CVS: Normal rate, no rub Resp: Bilateral chest rise with no increased work of breathing Abd: +choley drain, tenderness to palpation Ext: no edema Neuro:  awake, alert  Imaging: IR NON-TUNNELED CENTRAL VENOUS CATH [facility] W IMG Result Date: 03/05/2024 EXAM: Central venous catheter placement (non-tunneled) COMPARISON: none CLINICAL HISTORY: renal failure, needs access for dialysis. Complications: No immediate complications. Conscious Sedation: none Radiation dose reduction techniques were utilized as appropriate. Radiation exposure index (as provided by the fluoroscopic device): 1.6 mGy air kerma. Discussion: Time out performed including verification of patient, date of birth, procedure, and access site as appropriate. Informed written consent was obtained. The access site was prepared and draped using maximal sterile barrier technique including cutaneous antisepsis. The patient was positioned supine. Initial imaging was performed. Venous access: Access site: internal jugular Laterality: Right Indication for catheter placement: Dialysis Local anesthesia was administered. Under ultrasound guidance, venous access was obtained. A guidewire was advanced and a non-tunneled central venous catheter was placed using standard Seldinger technique. Catheter details: Catheter type: Trialysis Catheter length: 16cm Catheter tip position confirmed with imaging. The catheter was secured and a sterile dressing was applied. All lumens aspirated and flushed appropriately. Post-procedure imaging: Immediate post-placement imaging: Fluoroscopy Post-procedure imaging findings: tip proximal RA Radiation exposure index (as provided by the fluoroscopic device): 1.6 mgy air kerma Contrast: Contrast agent: None. Contrast volume: 0 mL. Estimated blood loss: Less than 10 mL. IMPRESSION: 1. Technically successful placement of a non-tunneled central venous dialysis catheter. 2. No immediate complications. Electronically signed by: Katheleen Faes MD 03/05/2024 09:46 AM EST RP  Workstation: [id number]   ECHOCARDIOGRAM COMPLETE Result Date: 03/04/2024    ECHOCARDIOGRAM REPORT   Patient Name:    [name] Date of Exam: 03/04/2024 Medical Rec #:  [id number]          Height:       73.0 in Accession #:    [id number]         Weight:       191.1 lb Date of Birth:  [birth year]         BSA:          2.110 m Patient Age:    78 years           BP:           114/60 mmHg Patient Gender: M                  HR:           90 bpm. Exam Location:  Inpatient Procedure: 2D Echo, Cardiac Doppler and Color Doppler (Both Spectral and Color            Flow Doppler were utilized during procedure). Indications:    Ventricular Tachycardia  History:        Patient has prior history of Echocardiogram examinations, most                 recent 03/29/2022. Risk Factors:Hypertension and Dyslipidemia.  Sonographer:    Sherlean Dubin Referring Phys: [id number] MADONNA LARGE IMPRESSIONS  1. Left ventricular ejection fraction, by estimation, is 55 to 60%. The left ventricle has normal function. The left ventricle has no regional wall motion abnormalities. There is mild concentric left ventricular hypertrophy. Left ventricular diastolic parameters are consistent with Grade I diastolic dysfunction (impaired relaxation).  2. Right ventricular systolic function is normal. The right ventricular size is mildly enlarged.  3. Right atrial size was mildly dilated.  4. The mitral valve is normal in structure. No evidence of mitral valve regurgitation. No evidence of mitral stenosis.  5. The aortic valve is tricuspid. There is moderate calcification of the aortic valve. Aortic valve regurgitation is not visualized. Aortic valve sclerosis/calcification is present, without any evidence of aortic stenosis.  6. The inferior vena cava is normal in size with greater than 50% respiratory variability, suggesting right atrial pressure of 3 mmHg. FINDINGS  Left Ventricle: Left ventricular ejection fraction, by estimation, is 55 to 60%. The left ventricle has normal function. The left ventricle has no regional wall motion abnormalities. The left ventricular  internal cavity size was normal in size. There is  mild concentric left ventricular hypertrophy. Left ventricular diastolic parameters are consistent with Grade I diastolic dysfunction (impaired relaxation). Right Ventricle: The right ventricular size is mildly enlarged. No increase in right ventricular wall thickness. Right ventricular systolic function is normal. Left Atrium: Left atrial size was normal in size. Right Atrium: Right atrial size was mildly dilated. Pericardium: There is no evidence of pericardial effusion. Mitral Valve: The mitral valve is normal in structure. No evidence of mitral valve regurgitation. No evidence of mitral valve stenosis. Tricuspid Valve: The tricuspid valve is normal in structure. Tricuspid valve regurgitation is mild . No evidence of tricuspid stenosis. Aortic Valve: The aortic valve is tricuspid. There is moderate calcification of the aortic valve. Aortic valve regurgitation is not visualized. Aortic valve sclerosis/calcification is present, without any evidence of aortic stenosis. Aortic valve mean gradient measures 8.0 mmHg. Aortic valve peak gradient measures 14.0 mmHg. Aortic valve area, by VTI measures 2.15  cm. Pulmonic Valve: The pulmonic valve was normal in structure. Pulmonic valve regurgitation is trivial. No evidence of pulmonic stenosis. Aorta: The aortic root is normal in size and structure. Venous: The inferior vena cava is normal in size with greater than 50% respiratory variability, suggesting right atrial pressure of 3 mmHg. IAS/Shunts: No atrial level shunt detected by color flow Doppler.  LEFT VENTRICLE PLAX 2D LVIDd:         5.10 cm   Diastology LVIDs:         3.50 cm   LV e' medial:    8.86 cm/s LV PW:         0.90 cm   LV E/e' medial:  6.8 LV IVS:        1.20 cm   LV e' lateral:   8.55 cm/s LVOT diam:     2.20 cm   LV E/e' lateral: 7.1 LV SV:         76 LV SV Index:   36 LVOT Area:     3.80 cm  RIGHT VENTRICLE             IVC RV Basal diam:  3.20 cm      IVC diam: 1.70 cm RV Mid diam:    2.70 cm RV S prime:     13.10 cm/s TAPSE (M-mode): 1.2 cm LEFT ATRIUM             Index        RIGHT ATRIUM           Index LA diam:        4.10 cm 1.94 cm/m   RA Area:     17.20 cm LA Vol (A2C):   46.7 ml 22.13 ml/m  RA Volume:   43.20 ml  20.47 ml/m LA Vol (A4C):   39.0 ml 18.48 ml/m LA Biplane Vol: 44.0 ml 20.85 ml/m  AORTIC VALVE AV Area (Vmax):    2.06 cm AV Area (Vmean):   2.07 cm AV Area (VTI):     2.15 cm AV Vmax:           187.00 cm/s AV Vmean:          128.500 cm/s AV VTI:            0.354 m AV Peak Grad:      14.0 mmHg AV Mean Grad:      8.0 mmHg LVOT Vmax:         101.50 cm/s LVOT Vmean:        70.050 cm/s LVOT VTI:          0.200 m LVOT/AV VTI ratio: 0.56  AORTA Ao Root diam: 3.40 cm Ao Asc diam:  3.50 cm MITRAL VALVE               TRICUSPID VALVE MV Area (PHT): 4.15 cm    TR Peak grad:   27.5 mmHg MV Decel Time: 183 msec    TR Vmax:        262.00 cm/s MR Peak grad: 6.2 mmHg MR Vmax:      125.00 cm/s  SHUNTS MV E velocity: 60.40 cm/s  Systemic VTI:  0.20 m MV A velocity: 38.60 cm/s  Systemic Diam: 2.20 cm MV E/A ratio:  1.56 Toribio Fuel MD Electronically signed by Toribio Fuel MD Signature Date/Time: 03/04/2024/10:39:24 PM    Final    DG CHEST PORT 1 VIEW Result Date: 03/04/2024 CLINICAL DATA:  Shortness of breath. EXAM: PORTABLE CHEST 1 VIEW  COMPARISON:  Lung bases from abdominopelvic CT 03/01/2024 FINDINGS: Elevated right hemidiaphragm with patchy opacity at the right lung base. Streaky left basilar densities. The heart is upper normal in size. Aortic atherosclerosis. Rounded right paratracheal density. No pneumothorax or large pleural effusion. IMPRESSION: 1. Elevated right hemidiaphragm with patchy opacity at the right lung base, atelectasis versus pneumonia. 2. Streaky left basilar densities, favor atelectasis. 3. Rounded right paratracheal density, tortuous vasculature versus adenopathy. Recommend further evaluation with contrast-enhanced  chest CT. Electronically Signed   By: Andrea Gasman M.D.   On: 03/04/2024 21:55    Labs: BMET Recent Labs  Lab 03/01/24 1507 03/01/24 2223 03/02/24 0252 03/03/24 0401 03/04/24 0244 03/05/24 0345  NA 135  --  135 135 137 136  K 2.9*  --  3.4* 3.6 4.0 4.0  CL 95*  --  102 101 101 103  CO2 25  --  16* 18* 18* 14*  GLUCOSE 105*  --  87 94 92 77  BUN 70*  --  74* 79* 88* 96*  CREATININE 6.00* 5.87* 5.97* 6.90* 7.41* 7.94*  CALCIUM  8.7*  --  8.0* 8.4* 8.4* 7.9*  PHOS  --   --   --  3.3  --  5.7*   CBC Recent Labs  Lab 03/02/24 0252 03/03/24 0401 03/04/24 0244 03/05/24 0344  WBC 19.3* 16.7* 12.8* 9.0  HGB 14.4 14.4 14.0 12.7*  HCT 41.6 42.0 41.8 38.3*  MCV 85.1 85.2 86.7 88.9  PLT 81* 92* 95* 100*    Medications:     Chlorhexidine  Gluconate Cloth  6 each Topical Q0600   heparin   5,000 Units Subcutaneous Q8H   lidocaine   1 patch Transdermal Q24H   metoprolol  succinate  25 mg Oral Daily   sodium chloride  flush  5 mL Intracatheter Q8H      "

## 2024-03-05 NOTE — Progress Notes (Signed)
 "      Subjective: CC: Reports he feels much better. Tolerating CLD without any current abdominal pain, n/v.   Objective: Vital signs in last 24 hours: Temp:  [97.3 F (36.3 C)-97.6 F (36.4 C)] 97.6 F (36.4 C) (12/23 0825) Pulse Rate:  [69-74] 74 (12/23 0944) Resp:  [17-20] 20 (12/23 0944) BP: (107-133)/(66-73) 115/69 (12/23 0944) SpO2:  [92 %-97 %] 95 % (12/23 0944) Last BM Date : 03/03/24  Intake/Output from previous day: 12/22 0701 - 12/23 0700 In: 893 [P.O.:1; I.V.:687; IV Piggyback:200] Out: 475 [Urine:350; Drains:125] Intake/Output this shift: Total I/O In: 100 [P.O.:100] Out: 250 [Urine:125; Drains:125]  PE: Gen:  Alert, NAD, pleasant Abd: Soft, ND, NT, IR perc chole drain bilious - 125cc/24 hours  Lab Results:  Recent Labs    03/04/24 0244 03/05/24 0344  WBC 12.8* 9.0  HGB 14.0 12.7*  HCT 41.8 38.3*  PLT 95* 100*   BMET Recent Labs    03/04/24 0244 03/05/24 0345  NA 137 136  K 4.0 4.0  CL 101 103  CO2 18* 14*  GLUCOSE 92 77  BUN 88* 96*  CREATININE 7.41* 7.94*  CALCIUM  8.4* 7.9*   PT/INR No results for input(s): LABPROT, INR in the last 72 hours. CMP     Component Value Date/Time   NA 136 03/05/2024 0345   K 4.0 03/05/2024 0345   CL 103 03/05/2024 0345   CO2 14 (L) 03/05/2024 0345   GLUCOSE 77 03/05/2024 0345   BUN 96 (H) 03/05/2024 0345   CREATININE 7.94 (H) 03/05/2024 0345   CREATININE 1.07 10/24/2019 1402   CALCIUM  7.9 (L) 03/05/2024 0345   PROT 5.8 (L) 03/05/2024 0344   ALBUMIN 2.5 (L) 03/05/2024 0345   AST 25 03/05/2024 0344   ALT 20 03/05/2024 0344   ALKPHOS 87 03/05/2024 0344   BILITOT 1.3 (H) 03/05/2024 0344   GFRNONAA 6 (L) 03/05/2024 0345   GFRAA >60 11/29/2019 1344   Lipase     Component Value Date/Time   LIPASE 97 (H) 03/01/2024 1507    Studies/Results: IR NON-TUNNELED CENTRAL VENOUS CATH PLC W IMG Result Date: 03/05/2024 EXAM: Central venous catheter placement (non-tunneled) COMPARISON: none CLINICAL  HISTORY: renal failure, needs access for dialysis. Complications: No immediate complications. Conscious Sedation: none Radiation dose reduction techniques were utilized as appropriate. Radiation exposure index (as provided by the fluoroscopic device): 1.6 mGy air kerma. Discussion: Time out performed including verification of patient, date of birth, procedure, and access site as appropriate. Informed written consent was obtained. The access site was prepared and draped using maximal sterile barrier technique including cutaneous antisepsis. The patient was positioned supine. Initial imaging was performed. Venous access: Access site: internal jugular Laterality: Right Indication for catheter placement: Dialysis Local anesthesia was administered. Under ultrasound guidance, venous access was obtained. A guidewire was advanced and a non-tunneled central venous catheter was placed using standard Seldinger technique. Catheter details: Catheter type: Trialysis Catheter length: 16cm Catheter tip position confirmed with imaging. The catheter was secured and a sterile dressing was applied. All lumens aspirated and flushed appropriately. Post-procedure imaging: Immediate post-placement imaging: Fluoroscopy Post-procedure imaging findings: tip proximal RA Radiation exposure index (as provided by the fluoroscopic device): 1.6 mgy air kerma Contrast: Contrast agent: None. Contrast volume: 0 mL. Estimated blood loss: Less than 10 mL. IMPRESSION: 1. Technically successful placement of a non-tunneled central venous dialysis catheter. 2. No immediate complications. Electronically signed by: Katheleen Faes MD 03/05/2024 09:46 AM EST RP Workstation: [id number]   ECHOCARDIOGRAM COMPLETE Result  Date: 03/04/2024    ECHOCARDIOGRAM REPORT   Patient Name:   [name] Date of Exam: 03/04/2024 Medical Rec #:  [id number]          Height:       73.0 in Accession #:    [id number]         Weight:       191.1 lb Date of Birth:  03-Feb-1946          BSA:          2.110 m Patient Age:    78 years           BP:           114/60 mmHg Patient Gender: M                  HR:           90 bpm. Exam Location:  Inpatient Procedure: 2D Echo, Cardiac Doppler and Color Doppler (Both Spectral and Color            Flow Doppler were utilized during procedure). Indications:    Ventricular Tachycardia  History:        Patient has prior history of Echocardiogram examinations, most                 recent 03/29/2022. Risk Factors:Hypertension and Dyslipidemia.  Sonographer:    Sherlean Dubin Referring Phys: [id number] MADONNA LARGE IMPRESSIONS  1. Left ventricular ejection fraction, by estimation, is 55 to 60%. The left ventricle has normal function. The left ventricle has no regional wall motion abnormalities. There is mild concentric left ventricular hypertrophy. Left ventricular diastolic parameters are consistent with Grade I diastolic dysfunction (impaired relaxation).  2. Right ventricular systolic function is normal. The right ventricular size is mildly enlarged.  3. Right atrial size was mildly dilated.  4. The mitral valve is normal in structure. No evidence of mitral valve regurgitation. No evidence of mitral stenosis.  5. The aortic valve is tricuspid. There is moderate calcification of the aortic valve. Aortic valve regurgitation is not visualized. Aortic valve sclerosis/calcification is present, without any evidence of aortic stenosis.  6. The inferior vena cava is normal in size with greater than 50% respiratory variability, suggesting right atrial pressure of 3 mmHg. FINDINGS  Left Ventricle: Left ventricular ejection fraction, by estimation, is 55 to 60%. The left ventricle has normal function. The left ventricle has no regional wall motion abnormalities. The left ventricular internal cavity size was normal in size. There is  mild concentric left ventricular hypertrophy. Left ventricular diastolic parameters are consistent with Grade I diastolic dysfunction  (impaired relaxation). Right Ventricle: The right ventricular size is mildly enlarged. No increase in right ventricular wall thickness. Right ventricular systolic function is normal. Left Atrium: Left atrial size was normal in size. Right Atrium: Right atrial size was mildly dilated. Pericardium: There is no evidence of pericardial effusion. Mitral Valve: The mitral valve is normal in structure. No evidence of mitral valve regurgitation. No evidence of mitral valve stenosis. Tricuspid Valve: The tricuspid valve is normal in structure. Tricuspid valve regurgitation is mild . No evidence of tricuspid stenosis. Aortic Valve: The aortic valve is tricuspid. There is moderate calcification of the aortic valve. Aortic valve regurgitation is not visualized. Aortic valve sclerosis/calcification is present, without any evidence of aortic stenosis. Aortic valve mean gradient measures 8.0 mmHg. Aortic valve peak gradient measures 14.0 mmHg. Aortic valve area, by VTI measures 2.15 cm. Pulmonic Valve: The pulmonic valve was  normal in structure. Pulmonic valve regurgitation is trivial. No evidence of pulmonic stenosis. Aorta: The aortic root is normal in size and structure. Venous: The inferior vena cava is normal in size with greater than 50% respiratory variability, suggesting right atrial pressure of 3 mmHg. IAS/Shunts: No atrial level shunt detected by color flow Doppler.  LEFT VENTRICLE PLAX 2D LVIDd:         5.10 cm   Diastology LVIDs:         3.50 cm   LV e' medial:    8.86 cm/s LV PW:         0.90 cm   LV E/e' medial:  6.8 LV IVS:        1.20 cm   LV e' lateral:   8.55 cm/s LVOT diam:     2.20 cm   LV E/e' lateral: 7.1 LV SV:         76 LV SV Index:   36 LVOT Area:     3.80 cm  RIGHT VENTRICLE             IVC RV Basal diam:  3.20 cm     IVC diam: 1.70 cm RV Mid diam:    2.70 cm RV S prime:     13.10 cm/s TAPSE (M-mode): 1.2 cm LEFT ATRIUM             Index        RIGHT ATRIUM           Index LA diam:        4.10 cm 1.94  cm/m   RA Area:     17.20 cm LA Vol (A2C):   46.7 ml 22.13 ml/m  RA Volume:   43.20 ml  20.47 ml/m LA Vol (A4C):   39.0 ml 18.48 ml/m LA Biplane Vol: 44.0 ml 20.85 ml/m  AORTIC VALVE AV Area (Vmax):    2.06 cm AV Area (Vmean):   2.07 cm AV Area (VTI):     2.15 cm AV Vmax:           187.00 cm/s AV Vmean:          128.500 cm/s AV VTI:            0.354 m AV Peak Grad:      14.0 mmHg AV Mean Grad:      8.0 mmHg LVOT Vmax:         101.50 cm/s LVOT Vmean:        70.050 cm/s LVOT VTI:          0.200 m LVOT/AV VTI ratio: 0.56  AORTA Ao Root diam: 3.40 cm Ao Asc diam:  3.50 cm MITRAL VALVE               TRICUSPID VALVE MV Area (PHT): 4.15 cm    TR Peak grad:   27.5 mmHg MV Decel Time: 183 msec    TR Vmax:        262.00 cm/s MR Peak grad: 6.2 mmHg MR Vmax:      125.00 cm/s  SHUNTS MV E velocity: 60.40 cm/s  Systemic VTI:  0.20 m MV A velocity: 38.60 cm/s  Systemic Diam: 2.20 cm MV E/A ratio:  1.56 Toribio Fuel MD Electronically signed by Toribio Fuel MD Signature Date/Time: 03/04/2024/10:39:24 PM    Final    DG CHEST PORT 1 VIEW Result Date: 03/04/2024 CLINICAL DATA:  Shortness of breath. EXAM: PORTABLE CHEST 1 VIEW COMPARISON:  Lung bases from abdominopelvic CT  03/01/2024 FINDINGS: Elevated right hemidiaphragm with patchy opacity at the right lung base. Streaky left basilar densities. The heart is upper normal in size. Aortic atherosclerosis. Rounded right paratracheal density. No pneumothorax or large pleural effusion. IMPRESSION: 1. Elevated right hemidiaphragm with patchy opacity at the right lung base, atelectasis versus pneumonia. 2. Streaky left basilar densities, favor atelectasis. 3. Rounded right paratracheal density, tortuous vasculature versus adenopathy. Recommend further evaluation with contrast-enhanced chest CT. Electronically Signed   By: Andrea Gasman M.D.   On: 03/04/2024 21:55    Anti-infectives: Anti-infectives (From admission, onward)    Start     Dose/Rate Route Frequency  Ordered Stop   03/02/24 1000  piperacillin -tazobactam (ZOSYN ) IVPB 3.375 g        3.375 g 12.5 mL/hr over 240 Minutes Intravenous Every 12 hours 03/01/24 1914     03/01/24 1715  piperacillin -tazobactam (ZOSYN ) IVPB 3.375 g        3.375 g 12.5 mL/hr over 240 Minutes Intravenous  Once 03/01/24 1713 03/01/24 2149        Assessment/Plan Acute Calculous Cholecystitis - S/p Perc Chole by IR 12/21. Drain flushes per their team - Recommend 5d abx after IR drain from our standpoint - Afebrile. WBC wnl. LFT's downtrending.  - Tolerating PO without n/v. Adv diet. - Will arrange follow up in the office for ~6 weeks to discuss interval cholecystectomy. He should follow up with IR before his appointment to have Cholangiogram.  -  If tolerates diet advancement, our team will sign off  FEN - Renal VTE - SCDs, SQH ID - Zosyn   I reviewed nursing notes, last 24 h vitals and pain scores, last 48 h intake and output, last 24 h labs and trends, and last 24 h imaging results.    LOS: 4 days    [name], [facility] 03/05/2024, 1:44 PM Please see Amion for pager number during day hours 7:00am-4:30pm  "

## 2024-03-05 NOTE — Procedures (Signed)
" °  Procedure:  R internal jugular TRialysis 16cm HD CVC to prox RA   Preprocedure diagnosis: The primary encounter diagnosis was Acute cholecystitis. Diagnoses of Acute renal failure, unspecified acute renal failure type and Hypokalemia were also pertinent to this visit. Postprocedure diagnosis: same EBL:    minimal Complications:   none immediate  See full dictation in [employer].  CHARM Toribio Faes MD Main # [phone] Pager  [phone] Mobile [phone]    "

## 2024-03-05 NOTE — Progress Notes (Signed)
 Brief rounding note: Telemetry was reviewed-underlying rhythm sinus with PVCs.  No sustained arrhythmias  No charge.  Dr. Marshia Tropea

## 2024-03-05 NOTE — Progress Notes (Signed)
 "   Referring Physician(s): Sebastian Moles   Supervising Physician: Johann Sieving  Patient Status:  [facility] - In-pt  Chief Complaint:  Acute cholecystitis S/p perc chole placement by Dr. Vanice on 12/21  Subjective:  Patient seen in IR while brought down for temp cath placement.  States that it is little sore around the drain, denies N/V.  Slow active oozing of blood noted around the insertion site, patient was not aware about the bleeding.   Allergies: Advil [ibuprofen] and Choline fenofibrate  Medications: Prior to Admission medications  Medication Sig Start Date End Date Taking? Authorizing Provider  atorvastatin  (LIPITOR) 40 MG tablet TAKE 1 TABLET BY MOUTH EVERY DAY 11/08/23  Yes Kennyth Worth HERO, MD  losartan  (COZAAR ) 50 MG tablet Take 1 tablet (50 mg total) by mouth daily. 02/02/24  Yes Kennyth Worth HERO, MD  metoprolol  succinate (TOPROL -XL) 25 MG 24 hr tablet Take 1 tablet (25 mg total) by mouth daily. Patient taking differently: Take 12.5 mg by mouth daily. 02/02/24  Yes Kennyth Worth HERO, MD  tadalafil  (CIALIS ) 20 MG tablet Take 0.5-1 tablets (10-20 mg total) by mouth daily as needed for erectile dysfunction. 11/09/23   Kennyth Worth HERO, MD     Vital Signs: BP 116/73 (BP Location: Right Arm)   Pulse 70   Temp (!) 97.5 F (36.4 C) (Oral)   Resp 18   Ht 6' 1 (1.854 m)   Wt 191 lb 1.6 oz (86.7 kg)   SpO2 92%   BMI 25.21 kg/m   Physical Exam Vitals reviewed.  Constitutional:      General: He is not in acute distress.    Appearance: He is not ill-appearing.  HENT:     Head: Normocephalic and atraumatic.  Pulmonary:     Effort: Pulmonary effort is normal.  Abdominal:     General: Abdomen is flat.     Palpations: Abdomen is soft.     Comments: Positive RUQ drain to a gravity bag. Suture and stat lock in place. Site with active oozing of fresh blood. New blood on the dressing.  Otherwise site is clean and dry. ~150 ml of  simple bile in the w/o clots.  Drain  aspirates and flushes well.    Musculoskeletal:     Cervical back: Neck supple.  Skin:    General: Skin is warm and dry.     Coloration: Skin is not jaundiced or pale.  Neurological:     Mental Status: He is alert.  Psychiatric:        Mood and Affect: Mood normal.        Behavior: Behavior normal.     Imaging: ECHOCARDIOGRAM COMPLETE Result Date: 03/04/2024    ECHOCARDIOGRAM REPORT   Patient Name:   [name] Date of Exam: 03/04/2024 Medical Rec #:  [id number]          Height:       73.0 in Accession #:    [id number]         Weight:       191.1 lb Date of Birth:  1945/11/05         BSA:          2.110 m Patient Age:    78 years           BP:           114/60 mmHg Patient Gender: M                  HR:  90 bpm. Exam Location:  Inpatient Procedure: 2D Echo, Cardiac Doppler and Color Doppler (Both Spectral and Color            Flow Doppler were utilized during procedure). Indications:    Ventricular Tachycardia  History:        Patient has prior history of Echocardiogram examinations, most                 recent 03/29/2022. Risk Factors:Hypertension and Dyslipidemia.  Sonographer:    Sherlean Dubin Referring Phys: [id number] MADONNA LARGE IMPRESSIONS  1. Left ventricular ejection fraction, by estimation, is 55 to 60%. The left ventricle has normal function. The left ventricle has no regional wall motion abnormalities. There is mild concentric left ventricular hypertrophy. Left ventricular diastolic parameters are consistent with Grade I diastolic dysfunction (impaired relaxation).  2. Right ventricular systolic function is normal. The right ventricular size is mildly enlarged.  3. Right atrial size was mildly dilated.  4. The mitral valve is normal in structure. No evidence of mitral valve regurgitation. No evidence of mitral stenosis.  5. The aortic valve is tricuspid. There is moderate calcification of the aortic valve. Aortic valve regurgitation is not visualized. Aortic valve  sclerosis/calcification is present, without any evidence of aortic stenosis.  6. The inferior vena cava is normal in size with greater than 50% respiratory variability, suggesting right atrial pressure of 3 mmHg. FINDINGS  Left Ventricle: Left ventricular ejection fraction, by estimation, is 55 to 60%. The left ventricle has normal function. The left ventricle has no regional wall motion abnormalities. The left ventricular internal cavity size was normal in size. There is  mild concentric left ventricular hypertrophy. Left ventricular diastolic parameters are consistent with Grade I diastolic dysfunction (impaired relaxation). Right Ventricle: The right ventricular size is mildly enlarged. No increase in right ventricular wall thickness. Right ventricular systolic function is normal. Left Atrium: Left atrial size was normal in size. Right Atrium: Right atrial size was mildly dilated. Pericardium: There is no evidence of pericardial effusion. Mitral Valve: The mitral valve is normal in structure. No evidence of mitral valve regurgitation. No evidence of mitral valve stenosis. Tricuspid Valve: The tricuspid valve is normal in structure. Tricuspid valve regurgitation is mild . No evidence of tricuspid stenosis. Aortic Valve: The aortic valve is tricuspid. There is moderate calcification of the aortic valve. Aortic valve regurgitation is not visualized. Aortic valve sclerosis/calcification is present, without any evidence of aortic stenosis. Aortic valve mean gradient measures 8.0 mmHg. Aortic valve peak gradient measures 14.0 mmHg. Aortic valve area, by VTI measures 2.15 cm. Pulmonic Valve: The pulmonic valve was normal in structure. Pulmonic valve regurgitation is trivial. No evidence of pulmonic stenosis. Aorta: The aortic root is normal in size and structure. Venous: The inferior vena cava is normal in size with greater than 50% respiratory variability, suggesting right atrial pressure of 3 mmHg. IAS/Shunts: No  atrial level shunt detected by color flow Doppler.  LEFT VENTRICLE PLAX 2D LVIDd:         5.10 cm   Diastology LVIDs:         3.50 cm   LV e' medial:    8.86 cm/s LV PW:         0.90 cm   LV E/e' medial:  6.8 LV IVS:        1.20 cm   LV e' lateral:   8.55 cm/s LVOT diam:     2.20 cm   LV E/e' lateral: 7.1 LV SV:  76 LV SV Index:   36 LVOT Area:     3.80 cm  RIGHT VENTRICLE             IVC RV Basal diam:  3.20 cm     IVC diam: 1.70 cm RV Mid diam:    2.70 cm RV S prime:     13.10 cm/s TAPSE (M-mode): 1.2 cm LEFT ATRIUM             Index        RIGHT ATRIUM           Index LA diam:        4.10 cm 1.94 cm/m   RA Area:     17.20 cm LA Vol (A2C):   46.7 ml 22.13 ml/m  RA Volume:   43.20 ml  20.47 ml/m LA Vol (A4C):   39.0 ml 18.48 ml/m LA Biplane Vol: 44.0 ml 20.85 ml/m  AORTIC VALVE AV Area (Vmax):    2.06 cm AV Area (Vmean):   2.07 cm AV Area (VTI):     2.15 cm AV Vmax:           187.00 cm/s AV Vmean:          128.500 cm/s AV VTI:            0.354 m AV Peak Grad:      14.0 mmHg AV Mean Grad:      8.0 mmHg LVOT Vmax:         101.50 cm/s LVOT Vmean:        70.050 cm/s LVOT VTI:          0.200 m LVOT/AV VTI ratio: 0.56  AORTA Ao Root diam: 3.40 cm Ao Asc diam:  3.50 cm MITRAL VALVE               TRICUSPID VALVE MV Area (PHT): 4.15 cm    TR Peak grad:   27.5 mmHg MV Decel Time: 183 msec    TR Vmax:        262.00 cm/s MR Peak grad: 6.2 mmHg MR Vmax:      125.00 cm/s  SHUNTS MV E velocity: 60.40 cm/s  Systemic VTI:  0.20 m MV A velocity: 38.60 cm/s  Systemic Diam: 2.20 cm MV E/A ratio:  1.56 Toribio Fuel MD Electronically signed by Toribio Fuel MD Signature Date/Time: 03/04/2024/10:39:24 PM    Final    DG CHEST PORT 1 VIEW Result Date: 03/04/2024 CLINICAL DATA:  Shortness of breath. EXAM: PORTABLE CHEST 1 VIEW COMPARISON:  Lung bases from abdominopelvic CT 03/01/2024 FINDINGS: Elevated right hemidiaphragm with patchy opacity at the right lung base. Streaky left basilar densities. The heart is  upper normal in size. Aortic atherosclerosis. Rounded right paratracheal density. No pneumothorax or large pleural effusion. IMPRESSION: 1. Elevated right hemidiaphragm with patchy opacity at the right lung base, atelectasis versus pneumonia. 2. Streaky left basilar densities, favor atelectasis. 3. Rounded right paratracheal density, tortuous vasculature versus adenopathy. Recommend further evaluation with contrast-enhanced chest CT. Electronically Signed   By: Andrea Gasman M.D.   On: 03/04/2024 21:55   IR Perc Cholecystostomy Result Date: 03/03/2024 INDICATION: Acute cholecystitis, non operative candidate EXAM: ULTRASOUND FLUOROSCOPIC PERCUTANEOUS TRANSHEPATIC CHOLECYSTOSTOMY MEDICATIONS: Patient is already receiving IV antibiotics as an inpatient ANESTHESIA/SEDATION: Moderate (conscious) sedation was employed during this procedure. A total of Versed  1.0 mg and Fentanyl  50 mcg was administered intravenously by the radiology nurse. Total intra-service moderate Sedation Time: 10 minutes. The patient's level of consciousness and  vital signs were monitored continuously by radiology nursing throughout the procedure under my direct supervision. FLUOROSCOPY: Radiation Exposure Index (as provided by the fluoroscopic device): 1.4 mGy Kerma COMPLICATIONS: None immediate. PROCEDURE: Informed written consent was obtained from the patient after a thorough discussion of the procedural risks, benefits and alternatives. All questions were addressed. Maximal Sterile Barrier Technique was utilized including caps, mask, sterile gowns, sterile gloves, sterile drape, hand hygiene and skin antiseptic. A timeout was performed prior to the initiation of the procedure. Previous imaging reviewed. Preliminary ultrasound performed. The distended gallbladder was localized in the right upper quadrant. Lower intercostal space marked. Under sterile conditions and local anesthesia, percutaneous needle access performed through a  transhepatic window with an 18 gauge 15 cm trocar needle. Images obtained for documentation. There was return of blood tinged bile. Sample sent for culture. Guidewire inserted followed by tract dilatation to insert a 10 French drain. Drain catheter position confirmed with ultrasound and fluoroscopy. Images again obtained for documentation. Gallbladder decompressed by syringe aspiration removing 80 cc. Catheter secured with a Prolene suture and a sterile dressing. Gravity drainage bag connected. No immediate complication. Patient tolerated the procedure well. IMPRESSION: Successful ultrasound and fluoroscopic percutaneous transhepatic cholecystostomy Electronically Signed   By: CHRISTELLA.  Shick M.D.   On: 03/03/2024 09:28   CT ABDOMEN PELVIS WO CONTRAST Result Date: 03/01/2024 CLINICAL DATA:  Abdominal pain, acute, nonlocalized R abd pain, renal failure. EXAM: CT ABDOMEN AND PELVIS WITHOUT CONTRAST TECHNIQUE: Multidetector CT imaging of the abdomen and pelvis was performed following the standard protocol without IV contrast. RADIATION DOSE REDUCTION: This exam was performed according to the departmental dose-optimization program which includes automated exposure control, adjustment of the mA and/or kV according to patient size and/or use of iterative reconstruction technique. COMPARISON:  None Available. FINDINGS: Lower chest: There is ground-glass opacity in the right lower lobe, concerning for pneumonitis. There also associated linear areas of atelectasis/scarring in the right lower lobe. No pleural effusion. Mildly enlarged heart size. No pericardial effusion. There are coronary artery atherosclerotic calcifications, in keeping with coronary artery disease. Hepatobiliary: The liver is normal in size. Non-cirrhotic configuration. No suspicious mass. No intrahepatic or extrahepatic bile duct dilation. No calcified choledocholithiasis. The gallbladder is distended measuring up to 5.4 cm in width and exhibit  mild-to-moderate circumferential wall thickening and moderate pericholecystic fat stranding. There are several subcentimeter sized faintly calcified gallstones. There are few prominent pericholecystic lymph nodes. Findings are compatible with acute cholecystitis. Pancreas: Unremarkable. No pancreatic ductal dilatation or surrounding inflammatory changes. Spleen: Within normal limits. No focal lesion. Adrenals/Urinary Tract: Unremarkable left adrenal gland. 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 Asymmetrically moderately atrophic right kidney noted. There is right extrarenal pelvis. There is mild fullness in the right renal collecting system. No right nephroureterolithiasis. There is a partially exophytic simple cyst arising from the left kidney interpolar region, posteriorly measuring 4.8 x 6.4 cm. There is a partially exophytic 2.5 x 2.7 cm soft tissue mass arising from the left kidney upper pole, which cannot be characterized as a simple cyst on this exam. There is indeterminate. Further evaluation with multiphasic contrast-enhanced MRI abdomen as per renal mass protocol is recommended. No left nephroureterolithiasis or obstructive uropathy. Stomach/Bowel: There is a small sliding hiatal hernia. There is a diverticulum arising from the second part of duodenum. No disproportionate dilation of the small or large bowel loops. Unremarkable appendix. 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 There is mild asymmetric thickening of the hepatic flexure of colon  which is likely secondary to inflammation from acute cholecystitis. Vascular/Lymphatic: No ascites or pneumoperitoneum. No abdominal or pelvic lymphadenopathy, by size criteria. No aneurysmal dilation of the major abdominal arteries. There are moderate peripheral atherosclerotic vascular calcifications of the aorta and its major branches. Reproductive: Normal size prostate. Symmetric seminal vesicles. Other: There is a small fat  containing umbilical hernia and bilateral small fat containing inguinal hernias. The soft tissues and abdominal wall are otherwise unremarkable. Musculoskeletal: No suspicious osseous lesions. There are mild - moderate multilevel degenerative changes in the visualized spine. IMPRESSION: 1. Findings compatible with acute cholecystitis. No calcified choledocholithiasis. 2. There is a 2.5 x 2.7 cm soft tissue mass arising from the left kidney upper pole, which cannot be characterized as a simple cyst on this exam. Further evaluation with multiphasic contrast-enhanced MRI abdomen as per renal mass protocol is recommended. 3. 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 4. Ground-glass opacity in the right lower lobe, concerning for pneumonitis. Correlate clinically. 5. Multiple other nonacute observations, as described above. Aortic Atherosclerosis (ICD10-I70.0). Electronically Signed   By: Ree Molt M.D.   On: 03/01/2024 17:22    Labs:  CBC: Recent Labs    03/02/24 0252 03/03/24 0401 03/04/24 0244 03/05/24 0344  WBC 19.3* 16.7* 12.8* 9.0  HGB 14.4 14.4 14.0 12.7*  HCT 41.6 42.0 41.8 38.3*  PLT 81* 92* 95* 100*    COAGS: Recent Labs    03/02/24 1338  INR 1.0    BMP: Recent Labs    03/02/24 0252 03/03/24 0401 03/04/24 0244 03/05/24 0345  NA 135 135 137 136  K 3.4* 3.6 4.0 4.0  CL 102 101 101 103  CO2 16* 18* 18* 14*  GLUCOSE 87 94 92 77  BUN 74* 79* 88* 96*  CALCIUM  8.0* 8.4* 8.4* 7.9*  CREATININE 5.97* 6.90* 7.41* 7.94*  GFRNONAA 9* 8* 7* 6*    LIVER FUNCTION TESTS: Recent Labs    03/02/24 0252 03/03/24 0401 03/04/24 0244 03/05/24 0344 03/05/24 0345  BILITOT 2.1* 2.1* 1.7* 1.3*  --   AST 61* 45* 31 25  --   ALT 39 34 27 20  --   ALKPHOS 142* 139* 108 87  --   PROT 5.9* 6.3* 6.1* 5.8*  --   ALBUMIN 2.6* 2.8*  2.8* 2.7* 2.5* 2.5*    Assessment and Plan:  78 y.o. male with acute acalculous cholecystitis s/p perc chole placement by Dr. Vanice on 03/03/24.    VSS Hgb from 14 to 12.7 overnight  T bili trending down  BUN 96, creatinine 7.94, GFR 6 - will start HD today per neph  Cx e coli   Drain Location: RUQ Size: Fr size: 10 Fr Date of placement: 12/21  Currently to: Drain collection device: gravity 24 hour output:  Output by Drain (mL) 03/03/24 0701 - 03/03/24 1900 03/03/24 1901 - 03/04/24 0700 03/04/24 0701 - 03/04/24 1900 03/04/24 1901 - 03/05/24 0700 03/05/24 0701 - 03/05/24 0823  Closed System Drain 1 Lateral RUQ Other (Comment) 10.2 Fr.  150  125     Interval imaging/drain manipulation:  None   Current examination: Flushes/aspirates easily.  Insertion site with active oozing of fresh blood.  Suture and stat lock in place. Dressed appropriately.   Plan: Insertion site with oozing of blood. No blood clots in the gravity bag. Wonder if due to elevated BUN. IR will follow closely. - RN notified, site check q shift.  Continue QD flushes with 5 cc NS. Record output  Q shift. Dressing changes QD or PRN if soiled.  Call IR APP or on call IR MD if difficulty flushing or sudden change in drain output.  Repeat imaging/possible drain injection once output < 10 mL/QD (excluding flush material). Consideration for drain removal if output is < 10 mL/QD (excluding flush material), pending discussion with the providing surgical service.  Discharge planning: Please contact IR APP or on call IR MD prior to patient d/c to ensure appropriate follow up plans are in place. Typically patient will follow up with IR 6-8 weeks post placement for repeat imaging/possible drain injection. IR scheduler will contact patient with date/time of appointment. Patient will need to flush drain QD with 5 cc NS, record output QD, dressing changes every 2-3 days or earlier if soiled.   IR will continue to follow - please call with questions or concerns.   Electronically Signed: Toya VEAR Cousin, PA-C 03/05/2024, 8:18 AM   I spent a total of 15 Minutes at the the  patient's bedside AND on the patient's hospital floor or unit, greater than 50% of which was counseling/coordinating care for perc chole tube f/u.   This chart was dictated using voice recognition software.  Despite best efforts to proofread,  errors can occur which can change the documentation meaning.   "

## 2024-03-06 DIAGNOSIS — N179 Acute kidney failure, unspecified: Secondary | ICD-10-CM | POA: Diagnosis not present

## 2024-03-06 LAB — CULTURE, BLOOD (ROUTINE X 2)
Culture: NO GROWTH
Culture: NO GROWTH
Special Requests: ADEQUATE
Special Requests: ADEQUATE

## 2024-03-06 LAB — CBC
HCT: 35.2 % — ABNORMAL LOW (ref 39.0–52.0)
Hemoglobin: 11.9 g/dL — ABNORMAL LOW (ref 13.0–17.0)
MCH: 28.8 pg (ref 26.0–34.0)
MCHC: 33.8 g/dL (ref 30.0–36.0)
MCV: 85.2 fL (ref 80.0–100.0)
Platelets: 94 K/uL — ABNORMAL LOW (ref 150–400)
RBC: 4.13 MIL/uL — ABNORMAL LOW (ref 4.22–5.81)
RDW: 15.3 % (ref 11.5–15.5)
WBC: 6.5 K/uL (ref 4.0–10.5)
nRBC: 0 % (ref 0.0–0.2)

## 2024-03-06 LAB — RENAL FUNCTION PANEL
Albumin: 2.6 g/dL — ABNORMAL LOW (ref 3.5–5.0)
Anion gap: 13 (ref 5–15)
BUN: 70 mg/dL — ABNORMAL HIGH (ref 8–23)
CO2: 22 mmol/L (ref 22–32)
Calcium: 7.7 mg/dL — ABNORMAL LOW (ref 8.9–10.3)
Chloride: 102 mmol/L (ref 98–111)
Creatinine, Ser: 6.23 mg/dL — ABNORMAL HIGH (ref 0.61–1.24)
GFR, Estimated: 9 mL/min — ABNORMAL LOW
Glucose, Bld: 110 mg/dL — ABNORMAL HIGH (ref 70–99)
Phosphorus: 4.1 mg/dL (ref 2.5–4.6)
Potassium: 3.4 mmol/L — ABNORMAL LOW (ref 3.5–5.1)
Sodium: 136 mmol/L (ref 135–145)

## 2024-03-06 LAB — HEPATIC FUNCTION PANEL
ALT: 16 U/L (ref 0–44)
AST: 22 U/L (ref 15–41)
Albumin: 2.6 g/dL — ABNORMAL LOW (ref 3.5–5.0)
Alkaline Phosphatase: 78 U/L (ref 38–126)
Bilirubin, Direct: 0.8 mg/dL — ABNORMAL HIGH (ref 0.0–0.2)
Indirect Bilirubin: 0.5 mg/dL (ref 0.3–0.9)
Total Bilirubin: 1.3 mg/dL — ABNORMAL HIGH (ref 0.0–1.2)
Total Protein: 5.7 g/dL — ABNORMAL LOW (ref 6.5–8.1)

## 2024-03-06 MED ORDER — HEPARIN SODIUM (PORCINE) 1000 UNIT/ML IJ SOLN
INTRAMUSCULAR | Status: AC
  Filled 2024-03-06: qty 3

## 2024-03-06 NOTE — Progress Notes (Signed)
 "   Referring Physician(s): Dr. Dann Hummer   Supervising Physician: Hughes Simmonds  Patient Status:  [facility] - In-pt  Chief Complaint:   Acute cholecystitis S/p perc chole placement by Dr. Vanice on 12/21  Subjective: Patient in bed watching TV. No apparent discomfort or distressed observed. He denies any significant pain or discomfort.   Allergies: Advil [ibuprofen] and Choline fenofibrate  Medications: Prior to Admission medications  Medication Sig Start Date End Date Taking? Authorizing Provider  atorvastatin  (LIPITOR) 40 MG tablet TAKE 1 TABLET BY MOUTH EVERY DAY 11/08/23  Yes Kennyth Worth HERO, MD  losartan  (COZAAR ) 50 MG tablet Take 1 tablet (50 mg total) by mouth daily. 02/02/24  Yes Kennyth Worth HERO, MD  metoprolol  succinate (TOPROL -XL) 25 MG 24 hr tablet Take 1 tablet (25 mg total) by mouth daily. Patient taking differently: Take 12.5 mg by mouth daily. 02/02/24  Yes Kennyth Worth HERO, MD  tadalafil  (CIALIS ) 20 MG tablet Take 0.5-1 tablets (10-20 mg total) by mouth daily as needed for erectile dysfunction. 11/09/23   Kennyth Worth HERO, MD     Vital Signs: BP 136/62 (BP Location: Right Arm)   Pulse 77   Temp (!) 97.5 F (36.4 C) (Oral)   Resp 20   Ht 6' 1 (1.854 m)   Wt 197 lb 5 oz (89.5 kg)   SpO2 95%   BMI 26.03 kg/m   Physical Exam Constitutional:      General: He is not in acute distress.    Appearance: He is not ill-appearing.  Cardiovascular:     Comments: RIJ temporary dialysis catheter. Site is clean/dry.  Pulmonary:     Effort: Pulmonary effort is normal.  Abdominal:     Tenderness: There is no abdominal tenderness.     Comments: RUQ drain to gravity. Patient states RN just changed dressing and flushed drain. Approximately 50 ml of thin, clear, bile in gravity bag.   Skin:    General: Skin is warm and dry.  Neurological:     Mental Status: He is alert and oriented to person, place, and time.     Labs:  CBC: Recent Labs    03/03/24 0401  03/04/24 0244 03/05/24 0344 03/06/24 0300  WBC 16.7* 12.8* 9.0 6.5  HGB 14.4 14.0 12.7* 11.9*  HCT 42.0 41.8 38.3* 35.2*  PLT 92* 95* 100* 94*    COAGS: Recent Labs    03/02/24 1338  INR 1.0    BMP: Recent Labs    03/03/24 0401 03/04/24 0244 03/05/24 0345 03/06/24 0300  NA 135 137 136 136  K 3.6 4.0 4.0 3.4*  CL 101 101 103 102  CO2 18* 18* 14* 22  GLUCOSE 94 92 77 110*  BUN 79* 88* 96* 70*  CALCIUM  8.4* 8.4* 7.9* 7.7*  CREATININE 6.90* 7.41* 7.94* 6.23*  GFRNONAA 8* 7* 6* 9*    LIVER FUNCTION TESTS: Recent Labs    03/03/24 0401 03/04/24 0244 03/05/24 0344 03/05/24 0345 03/06/24 0300  BILITOT 2.1* 1.7* 1.3*  --  1.3*  AST 45* 31 25  --  22  ALT 34 27 20  --  16  ALKPHOS 139* 108 87  --  78  PROT 6.3* 6.1* 5.8*  --  5.7*  ALBUMIN 2.8*  2.8* 2.7* 2.5* 2.5* 2.6*  2.6*    Assessment and Plan:  Acute cholecystitis S/p perc chole placement by Dr. Vanice on 12/21  Drain Location: RUQ Size: Fr size: 10 Fr Date of placement: 03/03/24  Currently to: [facility]  collection device: gravity 24 hour output:  Output by Drain (mL) 03/04/24 0700 - 03/04/24 1459 03/04/24 1500 - 03/04/24 2259 03/04/24 2300 - 03/05/24 0659 03/05/24 0700 - 03/05/24 1459 03/05/24 1500 - 03/05/24 2259 03/05/24 2300 - 03/06/24 0659 03/06/24 0700 - 03/06/24 1231  Closed System Drain 1 Lateral RUQ Other (Comment) 10.2 Fr.  125  125 300 350 175    Interval imaging/drain manipulation: None   Current examination: Dressed appropriately.   Plan: Continue TID flushes with 5 cc NS. Record output Q shift. Dressing changes QD or PRN if soiled.  Call IR APP or on call IR MD if difficulty flushing or sudden change in drain output.   Discharge planning: Percutaneous cholecystostomy drain to remain in place at least 6 weeks. Recommend fluoroscopy with injection of the drain in IR to evaluate for patency of the cystic duct. If the duct is patent and general surgery feels patient is stable for  cholecystectomy, the drain would be removed at time of surgery. If the duct is patent and general surgery feels patient is NEVER a candidate for cholecystectomy, drain can be capped for a trial. If symptoms recur, then place to gravity bag again. If trial is successful, discuss possible removal of the drain.  ** Outpatient follow up with IR has been ordered. A scheduler from our clinic will call the patient to arrange a date/time of his appointment. We discussed drain care and outpatient follow up. Patient unsure how he will care for the drain at home. His wife may be unable to assist.   IR will continue to follow - please call with questions or concerns.  Electronically Signed: Warren Dais, AGACNP-BC 03/06/2024, 12:30 PM   I spent a total of 15 Minutes at the the patient's bedside AND on the patient's hospital floor or unit, greater than 50% of which was counseling/coordinating care for acute cholecystitis.       "

## 2024-03-06 NOTE — Discharge Instructions (Signed)
 Drain Care:   1. Flush drain once daily with at least 5 ml NS. 2. Change the dressing daily or as needed. 3. Keep the site clean and dry.  4. Empty the bulb as needed and document the date/time/volume.  Ok to shower but the site must be covered with an occlusive dressing.

## 2024-03-06 NOTE — Progress Notes (Signed)
 " [facility] Progress Note    Assessment/ Plan:   AKI on CKD3a, oliguric -baseline Cr around 1.1-1.3 with AKI likely related to ATN - Has had some dehydration with attempted IV fluids but creatinine continues to rise - Nausea and vomiting likely not uremic in nature but hard to say definitively -Given ongoing rise of creatinine and oliguria we proceeded with dialysis on 12/23 and plan for dialysis again today.  Reevaluate for dialysis needs on Friday.  Some signs of renal recovery with increased urine output.   Acute cholecystitis -per primary service. Surgery following -s/p choley drain 12/21 with IR   Sepsis -secondary to gastroenteritis +/- cholecystitis - Antibiotics per primary   Left renal mass -recommend MRI with and without contrast as outpatient. If here longer than expected then can be done prior to discharge, will need to see urology if mass is suspicious for RCC   AGMA -secondary to AKI no concerns   HTN -BP soft, hold anti-HTNs   Thrombocytopenia -work up per primary    Subjective:   Patient continues to feel fairly well.  Tolerated dialysis yesterday with no issues.  Minimal abdominal pain today   Objective:   BP (!) 128/96 (BP Location: Right Arm)   Pulse 69   Temp (!) 97.5 F (36.4 C) (Oral)   Resp 20   Ht 6' 1 (1.854 m)   Wt 86.7 kg   SpO2 96%   BMI 25.21 kg/m   Intake/Output Summary (Last 24 hours) at 03/06/2024 0805 Last data filed at 03/06/2024 0750 Gross per 24 hour  Intake 110 ml  Output 1700 ml  Net -1590 ml   Weight change:   Physical Exam: Gen: Lying in bed in no distress CVS: Normal rate, no rub Resp: Bilateral chest rise with no increased work of breathing Abd: +choley drain, distended Ext: no edema Neuro: awake, alert  Imaging: IR NON-TUNNELED CENTRAL VENOUS CATH [facility] W IMG Result Date: 03/05/2024 EXAM: Central venous catheter placement (non-tunneled) COMPARISON: none CLINICAL HISTORY: renal failure, needs  access for dialysis. Complications: No immediate complications. Conscious Sedation: none Radiation dose reduction techniques were utilized as appropriate. Radiation exposure index (as provided by the fluoroscopic device): 1.6 mGy air kerma. Discussion: Time out performed including verification of patient, date of birth, procedure, and access site as appropriate. Informed written consent was obtained. The access site was prepared and draped using maximal sterile barrier technique including cutaneous antisepsis. The patient was positioned supine. Initial imaging was performed. Venous access: Access site: internal jugular Laterality: Right Indication for catheter placement: Dialysis Local anesthesia was administered. Under ultrasound guidance, venous access was obtained. A guidewire was advanced and a non-tunneled central venous catheter was placed using standard Seldinger technique. Catheter details: Catheter type: Trialysis Catheter length: 16cm Catheter tip position confirmed with imaging. The catheter was secured and a sterile dressing was applied. All lumens aspirated and flushed appropriately. Post-procedure imaging: Immediate post-placement imaging: Fluoroscopy Post-procedure imaging findings: tip proximal RA Radiation exposure index (as provided by the fluoroscopic device): 1.6 mgy air kerma Contrast: Contrast agent: None. Contrast volume: 0 mL. Estimated blood loss: Less than 10 mL. IMPRESSION: 1. Technically successful placement of a non-tunneled central venous dialysis catheter. 2. No immediate complications. Electronically signed by: Katheleen Faes MD 03/05/2024 09:46 AM EST RP Workstation: [id number]   ECHOCARDIOGRAM COMPLETE Result Date: 03/04/2024    ECHOCARDIOGRAM REPORT   Patient Name:   [name] Date of Exam: 03/04/2024 Medical Rec #:  [id number]  Height:       73.0 in Accession #:    [id number]         Weight:       191.1 lb Date of Birth:  1945-10-13         BSA:          2.110  m Patient Age:    78 years           BP:           114/60 mmHg Patient Gender: M                  HR:           90 bpm. Exam Location:  Inpatient Procedure: 2D Echo, Cardiac Doppler and Color Doppler (Both Spectral and Color            Flow Doppler were utilized during procedure). Indications:    Ventricular Tachycardia  History:        Patient has prior history of Echocardiogram examinations, most                 recent 03/29/2022. Risk Factors:Hypertension and Dyslipidemia.  Sonographer:    Sherlean Dubin Referring Phys: [id number] MADONNA LARGE IMPRESSIONS  1. Left ventricular ejection fraction, by estimation, is 55 to 60%. The left ventricle has normal function. The left ventricle has no regional wall motion abnormalities. There is mild concentric left ventricular hypertrophy. Left ventricular diastolic parameters are consistent with Grade I diastolic dysfunction (impaired relaxation).  2. Right ventricular systolic function is normal. The right ventricular size is mildly enlarged.  3. Right atrial size was mildly dilated.  4. The mitral valve is normal in structure. No evidence of mitral valve regurgitation. No evidence of mitral stenosis.  5. The aortic valve is tricuspid. There is moderate calcification of the aortic valve. Aortic valve regurgitation is not visualized. Aortic valve sclerosis/calcification is present, without any evidence of aortic stenosis.  6. The inferior vena cava is normal in size with greater than 50% respiratory variability, suggesting right atrial pressure of 3 mmHg. FINDINGS  Left Ventricle: Left ventricular ejection fraction, by estimation, is 55 to 60%. The left ventricle has normal function. The left ventricle has no regional wall motion abnormalities. The left ventricular internal cavity size was normal in size. There is  mild concentric left ventricular hypertrophy. Left ventricular diastolic parameters are consistent with Grade I diastolic dysfunction (impaired relaxation). Right  Ventricle: The right ventricular size is mildly enlarged. No increase in right ventricular wall thickness. Right ventricular systolic function is normal. Left Atrium: Left atrial size was normal in size. Right Atrium: Right atrial size was mildly dilated. Pericardium: There is no evidence of pericardial effusion. Mitral Valve: The mitral valve is normal in structure. No evidence of mitral valve regurgitation. No evidence of mitral valve stenosis. Tricuspid Valve: The tricuspid valve is normal in structure. Tricuspid valve regurgitation is mild . No evidence of tricuspid stenosis. Aortic Valve: The aortic valve is tricuspid. There is moderate calcification of the aortic valve. Aortic valve regurgitation is not visualized. Aortic valve sclerosis/calcification is present, without any evidence of aortic stenosis. Aortic valve mean gradient measures 8.0 mmHg. Aortic valve peak gradient measures 14.0 mmHg. Aortic valve area, by VTI measures 2.15 cm. Pulmonic Valve: The pulmonic valve was normal in structure. Pulmonic valve regurgitation is trivial. No evidence of pulmonic stenosis. Aorta: The aortic root is normal in size and structure. Venous: The inferior vena cava is normal in size with greater  than 50% respiratory variability, suggesting right atrial pressure of 3 mmHg. IAS/Shunts: No atrial level shunt detected by color flow Doppler.  LEFT VENTRICLE PLAX 2D LVIDd:         5.10 cm   Diastology LVIDs:         3.50 cm   LV e' medial:    8.86 cm/s LV PW:         0.90 cm   LV E/e' medial:  6.8 LV IVS:        1.20 cm   LV e' lateral:   8.55 cm/s LVOT diam:     2.20 cm   LV E/e' lateral: 7.1 LV SV:         76 LV SV Index:   36 LVOT Area:     3.80 cm  RIGHT VENTRICLE             IVC RV Basal diam:  3.20 cm     IVC diam: 1.70 cm RV Mid diam:    2.70 cm RV S prime:     13.10 cm/s TAPSE (M-mode): 1.2 cm LEFT ATRIUM             Index        RIGHT ATRIUM           Index LA diam:        4.10 cm 1.94 cm/m   RA Area:     17.20  cm LA Vol (A2C):   46.7 ml 22.13 ml/m  RA Volume:   43.20 ml  20.47 ml/m LA Vol (A4C):   39.0 ml 18.48 ml/m LA Biplane Vol: 44.0 ml 20.85 ml/m  AORTIC VALVE AV Area (Vmax):    2.06 cm AV Area (Vmean):   2.07 cm AV Area (VTI):     2.15 cm AV Vmax:           187.00 cm/s AV Vmean:          128.500 cm/s AV VTI:            0.354 m AV Peak Grad:      14.0 mmHg AV Mean Grad:      8.0 mmHg LVOT Vmax:         101.50 cm/s LVOT Vmean:        70.050 cm/s LVOT VTI:          0.200 m LVOT/AV VTI ratio: 0.56  AORTA Ao Root diam: 3.40 cm Ao Asc diam:  3.50 cm MITRAL VALVE               TRICUSPID VALVE MV Area (PHT): 4.15 cm    TR Peak grad:   27.5 mmHg MV Decel Time: 183 msec    TR Vmax:        262.00 cm/s MR Peak grad: 6.2 mmHg MR Vmax:      125.00 cm/s  SHUNTS MV E velocity: 60.40 cm/s  Systemic VTI:  0.20 m MV A velocity: 38.60 cm/s  Systemic Diam: 2.20 cm MV E/A ratio:  1.56 Toribio Fuel MD Electronically signed by Toribio Fuel MD Signature Date/Time: 03/04/2024/10:39:24 PM    Final    DG CHEST PORT 1 VIEW Result Date: 03/04/2024 CLINICAL DATA:  Shortness of breath. EXAM: PORTABLE CHEST 1 VIEW COMPARISON:  Lung bases from abdominopelvic CT 03/01/2024 FINDINGS: Elevated right hemidiaphragm with patchy opacity at the right lung base. Streaky left basilar densities. The heart is upper normal in size. Aortic atherosclerosis. Rounded right paratracheal density. No pneumothorax or large  pleural effusion. IMPRESSION: 1. Elevated right hemidiaphragm with patchy opacity at the right lung base, atelectasis versus pneumonia. 2. Streaky left basilar densities, favor atelectasis. 3. Rounded right paratracheal density, tortuous vasculature versus adenopathy. Recommend further evaluation with contrast-enhanced chest CT. Electronically Signed   By: Andrea Gasman M.D.   On: 03/04/2024 21:55    Labs: BMET Recent Labs  Lab 03/01/24 1507 03/01/24 2223 03/02/24 0252 03/03/24 0401 03/04/24 0244 03/05/24 0345  03/06/24 0300  NA 135  --  135 135 137 136 136  K 2.9*  --  3.4* 3.6 4.0 4.0 3.4*  CL 95*  --  102 101 101 103 102  CO2 25  --  16* 18* 18* 14* 22  GLUCOSE 105*  --  87 94 92 77 110*  BUN 70*  --  74* 79* 88* 96* 70*  CREATININE 6.00* 5.87* 5.97* 6.90* 7.41* 7.94* 6.23*  CALCIUM  8.7*  --  8.0* 8.4* 8.4* 7.9* 7.7*  PHOS  --   --   --  3.3  --  5.7* 4.1   CBC Recent Labs  Lab 03/03/24 0401 03/04/24 0244 03/05/24 0344 03/06/24 0300  WBC 16.7* 12.8* 9.0 6.5  HGB 14.4 14.0 12.7* 11.9*  HCT 42.0 41.8 38.3* 35.2*  MCV 85.2 86.7 88.9 85.2  PLT 92* 95* 100* 94*    Medications:     Chlorhexidine  Gluconate Cloth  6 each Topical Q0600   heparin   5,000 Units Subcutaneous Q8H   lidocaine   1 patch Transdermal Q24H   metoprolol  succinate  25 mg Oral Daily   sodium chloride  flush  5 mL Intracatheter Q8H      "

## 2024-03-06 NOTE — Progress Notes (Signed)
 "      Subjective: CC: Denies pain.  Tolerated diet.  First tray was cold and gave him dry heaves, but was able to eat after that.    Objective: Vital signs in last 24 hours: Temp:  [97.3 F (36.3 C)-98.3 F (36.8 C)] 98.3 F (36.8 C) (12/24 0836) Pulse Rate:  [68-82] 74 (12/24 0836) Resp:  [19-27] 22 (12/24 0836) BP: (103-131)/(62-96) 124/70 (12/24 0836) SpO2:  [94 %-98 %] 98 % (12/24 0836) Weight:  [89.2 kg] 89.2 kg (12/24 0836) Last BM Date : 03/05/24  Intake/Output from previous day: 12/23 0701 - 12/24 0700 In: 110 [P.O.:100; I.V.:10] Out: 1625 [Urine:850; Drains:775] Intake/Output this shift: Total I/O In: 100 [P.O.:100] Out: 150 [Urine:75; Drains:75]  PE: Gen:  Alert, NAD, pleasant Resp:  breathing comfortably Abd: Soft, ND, NT, IR perc chole drain bilious - 725 cc/24 hours  Lab Results:  Recent Labs    03/05/24 0344 03/06/24 0300  WBC 9.0 6.5  HGB 12.7* 11.9*  HCT 38.3* 35.2*  PLT 100* 94*   BMET Recent Labs    03/05/24 0345 03/06/24 0300  NA 136 136  K 4.0 3.4*  CL 103 102  CO2 14* 22  GLUCOSE 77 110*  BUN 96* 70*  CREATININE 7.94* 6.23*  CALCIUM  7.9* 7.7*   PT/INR No results for input(s): LABPROT, INR in the last 72 hours. CMP     Component Value Date/Time   NA 136 03/06/2024 0300   K 3.4 (L) 03/06/2024 0300   CL 102 03/06/2024 0300   CO2 22 03/06/2024 0300   GLUCOSE 110 (H) 03/06/2024 0300   BUN 70 (H) 03/06/2024 0300   CREATININE 6.23 (H) 03/06/2024 0300   CREATININE 1.07 10/24/2019 1402   CALCIUM  7.7 (L) 03/06/2024 0300   PROT 5.7 (L) 03/06/2024 0300   ALBUMIN 2.6 (L) 03/06/2024 0300   ALBUMIN 2.6 (L) 03/06/2024 0300   AST 22 03/06/2024 0300   ALT 16 03/06/2024 0300   ALKPHOS 78 03/06/2024 0300   BILITOT 1.3 (H) 03/06/2024 0300   GFRNONAA 9 (L) 03/06/2024 0300   GFRAA >60 11/29/2019 1344   Lipase     Component Value Date/Time   LIPASE 97 (H) 03/01/2024 1507    Studies/Results: IR NON-TUNNELED CENTRAL VENOUS CATH  PLC W IMG Result Date: 03/05/2024 EXAM: Central venous catheter placement (non-tunneled) COMPARISON: none CLINICAL HISTORY: renal failure, needs access for dialysis. Complications: No immediate complications. Conscious Sedation: none Radiation dose reduction techniques were utilized as appropriate. Radiation exposure index (as provided by the fluoroscopic device): 1.6 mGy air kerma. Discussion: Time out performed including verification of patient, date of birth, procedure, and access site as appropriate. Informed written consent was obtained. The access site was prepared and draped using maximal sterile barrier technique including cutaneous antisepsis. The patient was positioned supine. Initial imaging was performed. Venous access: Access site: internal jugular Laterality: Right Indication for catheter placement: Dialysis Local anesthesia was administered. Under ultrasound guidance, venous access was obtained. A guidewire was advanced and a non-tunneled central venous catheter was placed using standard Seldinger technique. Catheter details: Catheter type: Trialysis Catheter length: 16cm Catheter tip position confirmed with imaging. The catheter was secured and a sterile dressing was applied. All lumens aspirated and flushed appropriately. Post-procedure imaging: Immediate post-placement imaging: Fluoroscopy Post-procedure imaging findings: tip proximal RA Radiation exposure index (as provided by the fluoroscopic device): 1.6 mgy air kerma Contrast: Contrast agent: None. Contrast volume: 0 mL. Estimated blood loss: Less than 10 mL. IMPRESSION: 1. Technically successful placement  of a non-tunneled central venous dialysis catheter. 2. No immediate complications. Electronically signed by: Katheleen Faes MD 03/05/2024 09:46 AM EST RP Workstation: [id number]   ECHOCARDIOGRAM COMPLETE Result Date: 03/04/2024    ECHOCARDIOGRAM REPORT   Patient Name:   [name] Date of Exam: 03/04/2024 Medical Rec #:   [id number]          Height:       73.0 in Accession #:    [id number]         Weight:       191.1 lb Date of Birth:  [birth year]         BSA:          2.110 m Patient Age:    78 years           BP:           114/60 mmHg Patient Gender: M                  HR:           90 bpm. Exam Location:  Inpatient Procedure: 2D Echo, Cardiac Doppler and Color Doppler (Both Spectral and Color            Flow Doppler were utilized during procedure). Indications:    Ventricular Tachycardia  History:        Patient has prior history of Echocardiogram examinations, most                 recent 03/29/2022. Risk Factors:Hypertension and Dyslipidemia.  Sonographer:    Sherlean Dubin Referring Phys: [id number] MADONNA LARGE IMPRESSIONS  1. Left ventricular ejection fraction, by estimation, is 55 to 60%. The left ventricle has normal function. The left ventricle has no regional wall motion abnormalities. There is mild concentric left ventricular hypertrophy. Left ventricular diastolic parameters are consistent with Grade I diastolic dysfunction (impaired relaxation).  2. Right ventricular systolic function is normal. The right ventricular size is mildly enlarged.  3. Right atrial size was mildly dilated.  4. The mitral valve is normal in structure. No evidence of mitral valve regurgitation. No evidence of mitral stenosis.  5. The aortic valve is tricuspid. There is moderate calcification of the aortic valve. Aortic valve regurgitation is not visualized. Aortic valve sclerosis/calcification is present, without any evidence of aortic stenosis.  6. The inferior vena cava is normal in size with greater than 50% respiratory variability, suggesting right atrial pressure of 3 mmHg. FINDINGS  Left Ventricle: Left ventricular ejection fraction, by estimation, is 55 to 60%. The left ventricle has normal function. The left ventricle has no regional wall motion abnormalities. The left ventricular internal cavity size was normal in size. There is  mild  concentric left ventricular hypertrophy. Left ventricular diastolic parameters are consistent with Grade I diastolic dysfunction (impaired relaxation). Right Ventricle: The right ventricular size is mildly enlarged. No increase in right ventricular wall thickness. Right ventricular systolic function is normal. Left Atrium: Left atrial size was normal in size. Right Atrium: Right atrial size was mildly dilated. Pericardium: There is no evidence of pericardial effusion. Mitral Valve: The mitral valve is normal in structure. No evidence of mitral valve regurgitation. No evidence of mitral valve stenosis. Tricuspid Valve: The tricuspid valve is normal in structure. Tricuspid valve regurgitation is mild . No evidence of tricuspid stenosis. Aortic Valve: The aortic valve is tricuspid. There is moderate calcification of the aortic valve. Aortic valve regurgitation is not visualized. Aortic valve sclerosis/calcification is present, without any evidence of aortic  stenosis. Aortic valve mean gradient measures 8.0 mmHg. Aortic valve peak gradient measures 14.0 mmHg. Aortic valve area, by VTI measures 2.15 cm. Pulmonic Valve: The pulmonic valve was normal in structure. Pulmonic valve regurgitation is trivial. No evidence of pulmonic stenosis. Aorta: The aortic root is normal in size and structure. Venous: The inferior vena cava is normal in size with greater than 50% respiratory variability, suggesting right atrial pressure of 3 mmHg. IAS/Shunts: No atrial level shunt detected by color flow Doppler.  LEFT VENTRICLE PLAX 2D LVIDd:         5.10 cm   Diastology LVIDs:         3.50 cm   LV e' medial:    8.86 cm/s LV PW:         0.90 cm   LV E/e' medial:  6.8 LV IVS:        1.20 cm   LV e' lateral:   8.55 cm/s LVOT diam:     2.20 cm   LV E/e' lateral: 7.1 LV SV:         76 LV SV Index:   36 LVOT Area:     3.80 cm  RIGHT VENTRICLE             IVC RV Basal diam:  3.20 cm     IVC diam: 1.70 cm RV Mid diam:    2.70 cm RV S prime:      13.10 cm/s TAPSE (M-mode): 1.2 cm LEFT ATRIUM             Index        RIGHT ATRIUM           Index LA diam:        4.10 cm 1.94 cm/m   RA Area:     17.20 cm LA Vol (A2C):   46.7 ml 22.13 ml/m  RA Volume:   43.20 ml  20.47 ml/m LA Vol (A4C):   39.0 ml 18.48 ml/m LA Biplane Vol: 44.0 ml 20.85 ml/m  AORTIC VALVE AV Area (Vmax):    2.06 cm AV Area (Vmean):   2.07 cm AV Area (VTI):     2.15 cm AV Vmax:           187.00 cm/s AV Vmean:          128.500 cm/s AV VTI:            0.354 m AV Peak Grad:      14.0 mmHg AV Mean Grad:      8.0 mmHg LVOT Vmax:         101.50 cm/s LVOT Vmean:        70.050 cm/s LVOT VTI:          0.200 m LVOT/AV VTI ratio: 0.56  AORTA Ao Root diam: 3.40 cm Ao Asc diam:  3.50 cm MITRAL VALVE               TRICUSPID VALVE MV Area (PHT): 4.15 cm    TR Peak grad:   27.5 mmHg MV Decel Time: 183 msec    TR Vmax:        262.00 cm/s MR Peak grad: 6.2 mmHg MR Vmax:      125.00 cm/s  SHUNTS MV E velocity: 60.40 cm/s  Systemic VTI:  0.20 m MV A velocity: 38.60 cm/s  Systemic Diam: 2.20 cm MV E/A ratio:  1.56 Toribio Fuel MD Electronically signed by Toribio Fuel MD Signature Date/Time: 03/04/2024/10:39:24 PM    Final  DG CHEST PORT 1 VIEW Result Date: 03/04/2024 CLINICAL DATA:  Shortness of breath. EXAM: PORTABLE CHEST 1 VIEW COMPARISON:  Lung bases from abdominopelvic CT 03/01/2024 FINDINGS: Elevated right hemidiaphragm with patchy opacity at the right lung base. Streaky left basilar densities. The heart is upper normal in size. Aortic atherosclerosis. Rounded right paratracheal density. No pneumothorax or large pleural effusion. IMPRESSION: 1. Elevated right hemidiaphragm with patchy opacity at the right lung base, atelectasis versus pneumonia. 2. Streaky left basilar densities, favor atelectasis. 3. Rounded right paratracheal density, tortuous vasculature versus adenopathy. Recommend further evaluation with contrast-enhanced chest CT. Electronically Signed   By: Andrea Gasman M.D.    On: 03/04/2024 21:55    Anti-infectives: Anti-infectives (From admission, onward)    Start     Dose/Rate Route Frequency Ordered Stop   03/02/24 1000  piperacillin -tazobactam (ZOSYN ) IVPB 3.375 g        3.375 g 12.5 mL/hr over 240 Minutes Intravenous Every 12 hours 03/01/24 1914     03/01/24 1715  piperacillin -tazobactam (ZOSYN ) IVPB 3.375 g        3.375 g 12.5 mL/hr over 240 Minutes Intravenous  Once 03/01/24 1713 03/01/24 2149        Assessment/Plan Acute Calculous Cholecystitis - S/p Perc Chole by IR 12/21. Drain flushes per their team - Recommend 5d abx after IR drain from our standpoint - Afebrile. WBC wnl. LFT's near normal, stable.  - Tolerating PO without n/v. - Will arrange follow up in the office for ~6 weeks to discuss interval cholecystectomy. He should follow up with IR before his appointment to have Cholangiogram.   - at that point, HD situation should be stable if he continues to need this.   -watch for high drain output as this can cause dehydration.   - will sign off.  Call for additional questions.    FEN - Renal VTE - SCDs, SQH ID - Zosyn   I reviewed nursing notes, last 24 h vitals and pain scores, last 48 h intake and output, last 24 h labs and trends, and last 24 h imaging results.    LOS: 5 days  Jina LITTIE Nephew, MD, FACS, FSSO Surgical Oncology, General Surgery, Trauma and Critical [facility], [place] [phone] for weekday/non holidays Check [email or web address] for coverage night/weekend/holidays under General Surgery    "

## 2024-03-06 NOTE — Progress Notes (Signed)
 " PROGRESS NOTE    [name]  FMW:[id number] DOB: 12/20/1945 DOA: 03/01/2024 PCP: Kennyth Worth HERO, MD  Chief Complaint  Patient presents with   multiple complaints    Brief Narrative:   Patient is [name] 78 year old male with PMHx of HTN and HLD who presented to the ED on 03/01/2024 with several days of nausea, vomiting, and diarrhea. Symptoms began on 12/14 after returning from church and eating Rhyanna Sorce frozen [employer] chicken meal, followed about an hour later by chills/subjective fevers with rigors, then profuse emesis and subsequent multiple episodes of loose watery stools. Emesis progressed from food contents to watery, nonbloody. Stools watery without blood or mucus. He reports poor p.o. intake since Sunday and generalized, nonfocal abdominal discomfort. Also notes markedly decreased urine output over the past 2 days with dark orange urine, without gross hematuria. In the ED he was found to have AKI and hypokalemia, with CT abdomen findings concerning for acute cholecystitis. He was admitted to the hospital service for further management.   Now s/p perc chole tube.  Has temp HD cath and is getting HD per renal.  Dispo pending renal recovery at this point.    Assessment & Plan:   Principal Problem:   AKI (acute kidney injury) Active Problems:   Essential hypertension   History of peptic ulcer   Hypokalemia   Acute cholecystitis   NSVT (nonsustained ventricular tachycardia) (HCC)   PVC (premature ventricular contraction)   Sepsis with acute renal failure (HCC)   Thrombocytopenia  AKI on CKD stage IIIa  Oliguria - Likely prerenal in the setting of decreased PO intake and increased GI losses with concomitant Losartan  use. Nephrology suspecting ATN given no obstruction.  - Cr peaked to 7.9 12/23 - now s/p temp HD cath and dialysis - per renal, appreciate assistance   Acute hypoxic respiratory failure secondary to volume overload  - Patient not on O2 at baseline with no  known history of CHF. Currently net positive. Patient became tachypneic and hypoxic to 88% on RA likely secondary to volume overload on 12/22.  - currently on 4 L [place] - Echo (12/22) showed LVEF 55-60% with no RWMA, mild concentric LVH, G1DD, normal RV function, no valvular abnormalities - CXR elevated R hemidiaphragm with R basilar opacity (atelectasis vs PNA) and L basilar atelectasis, rounded R paratracheal density (tortuous vessel vs adenopathy), recommended CT chest w/ contrast. No effusion or PTX.  - continue to wean as tolerated  - if persistent, needs additional workup - hopefully will improve with continued HD  AGMA Improved with dialysis follow   Sepsis Intermittent hypotension - Met SIRS criteria on admission with tachycardia, tachypnea, leukocytosis, likely secondary to cholecystitis, gastroenteritis - now s/p perc chole tube as noted below   Acute cholecystitis - Has RUQ pain on palpation with CT evidence of acute cholecystitis without CBD dilation - now s/p perc chole drain 12/21 - culture growing e. Coli  - General surgery following, recommending 5 days abx after IR drain.  Needs follow up in general surgery office in 6 weeks to discuss interval cholecystectomy.  Needs follow up with IR to have cholangiogram before his appt.  Watch for high output. - Continue IV Zosyn    Hyperphosphatemia - Phos 5.7 - Secondary acute renal failure - per renal    PVCs  NSVT - Tele alerted to multiple runs of VT on 12/21 during which patient was asymptomatic and HDS. - Patient with known history of high PVC burden. - Cardiology consulted on 12/21,  determined NSVT with elevated burden of PVC worsened by ongoing acute illness, recommended echo and electrolyte replacement - cardiology has signed off - last note 12/23 - Echo without concerning findings, as noted above - Continue Toprol -XL 25 mg (note patient's home prescription is for 25 mg daily but he reports taking 12.5 mg daily)    Gastroenteritis - Patient presented with N/V and diarrhea which started after eating frozen meal - No further episodes of diarrhea, nausea, or vomiting.  Tolerating renal diet - GIPP and C diff PCR negative - Continue conservative management - Advance diet as tolerated - PRN Zofran   - PRN imodium   UTI ruled out - Urine culture showed no growth   Hypokalemia  Follow, judicious replacement in setting of AKI needing dialysis - hold today   Thrombocytopenia - Platelets decreased but relatively stable - Likely reactive to ongoing acute illness - Continue to monitor   HTN - Continue holding home Losartan  (also in setting of AKI) - Continue Troprol-XL 25 mg daily   Left renal mass - CT abd/pel showed soft tissue mass on left kidney - Will need MRI with contrast per renal mass protocol at discharge   Abnormal CXR Right paratracheal density, tortuous vasculature vs adenopathy, needs CT with contrast when able - currently on hold with AKI  Transaminitis - resolved Direct Hyperbilirubinemia - improving -  AST, ALT, and ALP normalized - T. Bili improving - Possibly related to acute illness in the setting of cholecystitis   HLD - Statin held due to transaminitis    DVT prophylaxis: heparin  Code Status: full Family Communication: none Disposition:   Status is: Inpatient Remains inpatient appropriate because: need for continued inpatient care   Consultants:    Procedures:  12/21 Procedure: US  AND FLUORO CHOLECYSTOSTOMY    Echo IMPRESSIONS     1. Left ventricular ejection fraction, by estimation, is 55 to 60%. The  left ventricle has normal function. The left ventricle has no regional  wall motion abnormalities. There is mild concentric left ventricular  hypertrophy. Left ventricular diastolic  parameters are consistent with Grade I diastolic dysfunction (impaired  relaxation).   2. Right ventricular systolic function is normal. The right ventricular  size is  mildly enlarged.   3. Right atrial size was mildly dilated.   4. The mitral valve is normal in structure. No evidence of mitral valve  regurgitation. No evidence of mitral stenosis.   5. The aortic valve is tricuspid. There is moderate calcification of the  aortic valve. Aortic valve regurgitation is not visualized. Aortic valve  sclerosis/calcification is present, without any evidence of aortic  stenosis.   6. The inferior vena cava is normal in size with greater than 50%  respiratory variability, suggesting right atrial pressure of 3 mmHg.     Antimicrobials:  Anti-infectives (From admission, onward)    Start     Dose/Rate Route Frequency Ordered Stop   03/02/24 1000  piperacillin -tazobactam (ZOSYN ) IVPB 3.375 g        3.375 g 12.5 mL/hr over 240 Minutes Intravenous Every 12 hours 03/01/24 1914     03/01/24 1715  piperacillin -tazobactam (ZOSYN ) IVPB 3.375 g        3.375 g 12.5 mL/hr over 240 Minutes Intravenous  Once 03/01/24 1713 03/01/24 2149       Subjective: No complaints Poor appetite  Objective: Vitals:   03/06/24 1115 03/06/24 1128 03/06/24 1155 03/06/24 1508  BP: 125/87 (!) 144/76 136/62 130/85  Pulse: 76 74 77 80  Resp: (!) 26 (!)  28 20 20   Temp:  97.8 F (36.6 C) (!) 97.5 F (36.4 C) 97.6 F (36.4 C)  TempSrc:   Oral Oral  SpO2: 96% 95% 95% 92%  Weight:  89.5 kg    Height:        Intake/Output Summary (Last 24 hours) at 03/06/2024 1845 Last data filed at 03/06/2024 1833 Gross per 24 hour  Intake 235 ml  Output 1625 ml  Net -1390 ml   Filed Weights   03/01/24 2109 03/06/24 0836 03/06/24 1128  Weight: 86.7 kg 89.2 kg 89.5 kg    Examination:  General exam: Appears calm and comfortable  Respiratory system: Clear to auscultation. Respiratory effort normal. Cardiovascular system: RRR Gastrointestinal system: Abdomen is nondistended, soft and nontender. Central nervous system: Alert and oriented. No focal neurological deficits. Extremities: no  lee    Data Reviewed: I have personally reviewed following labs and imaging studies  CBC: Recent Labs  Lab 03/02/24 0252 03/03/24 0401 03/04/24 0244 03/05/24 0344 03/06/24 0300  WBC 19.3* 16.7* 12.8* 9.0 6.5  HGB 14.4 14.4 14.0 12.7* 11.9*  HCT 41.6 42.0 41.8 38.3* 35.2*  MCV 85.1 85.2 86.7 88.9 85.2  PLT 81* 92* 95* 100* 94*    Basic Metabolic Panel: Recent Labs  Lab 03/01/24 1508 03/01/24 2223 03/02/24 0252 03/03/24 0401 03/03/24 1324 03/04/24 0244 03/05/24 0345 03/06/24 0300  NA  --   --  135 135  --  137 136 136  K  --   --  3.4* 3.6  --  4.0 4.0 3.4*  CL  --   --  102 101  --  101 103 102  CO2  --   --  16* 18*  --  18* 14* 22  GLUCOSE  --   --  87 94  --  92 77 110*  BUN  --   --  74* 79*  --  88* 96* 70*  CREATININE  --    < > 5.97* 6.90*  --  7.41* 7.94* 6.23*  CALCIUM   --   --  8.0* 8.4*  --  8.4* 7.9* 7.7*  MG 2.5*  --  2.2  --  2.0  --   --   --   PHOS  --   --   --  3.3  --   --  5.7* 4.1   < > = values in this interval not displayed.    GFR: Estimated Creatinine Clearance: 11.4 mL/min (Shera Laubach) (by C-G formula based on SCr of 6.23 mg/dL (H)).  Liver Function Tests: Recent Labs  Lab 03/02/24 0252 03/03/24 0401 03/04/24 0244 03/05/24 0344 03/05/24 0345 03/06/24 0300  AST 61* 45* 31 25  --  22  ALT 39 34 27 20  --  16  ALKPHOS 142* 139* 108 87  --  78  BILITOT 2.1* 2.1* 1.7* 1.3*  --  1.3*  PROT 5.9* 6.3* 6.1* 5.8*  --  5.7*  ALBUMIN 2.6* 2.8*  2.8* 2.7* 2.5* 2.5* 2.6*  2.6*    CBG: No results for input(s): GLUCAP in the last 168 hours.   Recent Results (from the past 240 hours)  Resp panel by RT-PCR (RSV, Flu Nataleah Scioneaux&B, Covid) Anterior Nasal Swab     Status: None   Collection Time: 03/01/24  3:08 PM   Specimen: Anterior Nasal Swab  Result Value Ref Range Status   SARS Coronavirus 2 by RT PCR NEGATIVE NEGATIVE Final    Comment: (NOTE) SARS-CoV-2 target nucleic acids are NOT DETECTED.  The SARS-CoV-2 RNA is generally detectable in upper  respiratory specimens during the acute phase of infection. The lowest concentration of SARS-CoV-2 viral copies this assay can detect is 138 copies/mL. Jaeliana Lococo negative result does not preclude SARS-Cov-2 infection and should not be used as the sole basis for treatment or other patient management decisions. Melani Brisbane negative result may occur with  improper specimen collection/handling, submission of specimen other than nasopharyngeal swab, presence of viral mutation(s) within the areas targeted by this assay, and inadequate number of viral copies(<138 copies/mL). Ahliya Glatt negative result must be combined with clinical observations, patient history, and epidemiological information. The expected result is Negative.  Fact Sheet for Patients:  bloggercourse.com  Fact Sheet for Healthcare Providers:  seriousbroker.it  This test is no t yet approved or cleared by the [place]  FDA and  has been authorized for detection and/or diagnosis of SARS-CoV-2 by FDA under an Emergency Use Authorization (EUA). This EUA will remain  in effect (meaning this test can be used) for the duration of the COVID-19 declaration under Section 564(b)(1) of the Act, 21 U.S.C.section 360bbb-3(b)(1), unless the authorization is terminated  or revoked sooner.       Influenza Zaccheaus Storlie by PCR NEGATIVE NEGATIVE Final   Influenza B by PCR NEGATIVE NEGATIVE Final    Comment: (NOTE) The Xpert Xpress SARS-CoV-2/FLU/RSV plus assay is intended as an aid in the diagnosis of influenza from Nasopharyngeal swab specimens and should not be used as Taqwa Deem sole basis for treatment. Nasal washings and aspirates are unacceptable for Xpert Xpress SARS-CoV-2/FLU/RSV testing.  Fact Sheet for Patients: bloggercourse.com  Fact Sheet for Healthcare Providers: seriousbroker.it  This test is not yet approved or cleared by the [place]  FDA and has been  authorized for detection and/or diagnosis of SARS-CoV-2 by FDA under an Emergency Use Authorization (EUA). This EUA will remain in effect (meaning this test can be used) for the duration of the COVID-19 declaration under Section 564(b)(1) of the Act, 21 U.S.C. section 360bbb-3(b)(1), unless the authorization is terminated or revoked.     Resp Syncytial Virus by PCR NEGATIVE NEGATIVE Final    Comment: (NOTE) Fact Sheet for Patients: bloggercourse.com  Fact Sheet for Healthcare Providers: seriousbroker.it  This test is not yet approved or cleared by the [place]  FDA and has been authorized for detection and/or diagnosis of SARS-CoV-2 by FDA under an Emergency Use Authorization (EUA). This EUA will remain in effect (meaning this test can be used) for the duration of the COVID-19 declaration under Section 564(b)(1) of the Act, 21 U.S.C. section 360bbb-3(b)(1), unless the authorization is terminated or revoked.  Performed at [facility], [address] [place], [place] [address]   Blood culture (routine x 2)     Status: None   Collection Time: 03/01/24  5:25 PM   Specimen: BLOOD  Result Value Ref Range Status   Specimen Description   Final    BLOOD RIGHT ANTECUBITAL Performed at [facility], [address] [place], [place] [address]    Special Requests   Final    BOTTLES DRAWN AEROBIC AND ANAEROBIC Blood Culture adequate volume Performed at [facility], [address] [place], [place] [address]    Culture   Final    NO GROWTH 5 DAYS Performed at [facility], [address] [place], [place] [address]    Report Status 03/06/2024 FINAL  Final  Blood culture (routine x 2)     Status: None  Collection Time: 03/01/24  5:41 PM   Specimen: BLOOD RIGHT FOREARM  Result Value Ref Range Status   Specimen Description   Final    BLOOD RIGHT FOREARM Performed at [facility],  [address] [place], [place] [address]    Special Requests   Final    BOTTLES DRAWN AEROBIC AND ANAEROBIC Blood Culture adequate volume Performed at [facility], [address] [place], [place] [address]    Culture   Final    NO GROWTH 5 DAYS Performed at [facility], [address], [place], [place] [address]    Report Status 03/06/2024 FINAL  Final  Gastrointestinal Panel by PCR , Stool     Status: None   Collection Time: 03/01/24 10:05 PM   Specimen: Stool  Result Value Ref Range Status   Campylobacter species NOT DETECTED NOT DETECTED Final   Plesimonas shigelloides NOT DETECTED NOT DETECTED Final   Salmonella species NOT DETECTED NOT DETECTED Final   Yersinia enterocolitica NOT DETECTED NOT DETECTED Final   Vibrio species NOT DETECTED NOT DETECTED Final   Vibrio cholerae NOT DETECTED NOT DETECTED Final   Enteroaggregative E coli (EAEC) NOT DETECTED NOT DETECTED Final   Enteropathogenic E coli (EPEC) NOT DETECTED NOT DETECTED Final   Enterotoxigenic E coli (ETEC) NOT DETECTED NOT DETECTED Final   Shiga like toxin producing E coli (STEC) NOT DETECTED NOT DETECTED Final   Shigella/Enteroinvasive E coli (EIEC) NOT DETECTED NOT DETECTED Final   Cryptosporidium NOT DETECTED NOT DETECTED Final   Cyclospora cayetanensis NOT DETECTED NOT DETECTED Final   Entamoeba histolytica NOT DETECTED NOT DETECTED Final   Giardia lamblia NOT DETECTED NOT DETECTED Final   Adenovirus F40/41 NOT DETECTED NOT DETECTED Final   Astrovirus NOT DETECTED NOT DETECTED Final   Norovirus GI/GII NOT DETECTED NOT DETECTED Final   Rotavirus Rilya Longo NOT DETECTED NOT DETECTED Final   Sapovirus (I, II, IV, and V) NOT DETECTED NOT DETECTED Final    Comment: Performed at [facility], [address] [place], [place] [address]  C Difficile Quick Screen w PCR reflex     Status: None   Collection Time: 03/01/24 10:05 PM   Specimen: Stool  Result Value Ref Range Status   C Diff antigen  NEGATIVE NEGATIVE Final   C Diff toxin NEGATIVE NEGATIVE Final   C Diff interpretation No C. difficile detected.  Final    Comment: Performed at [facility], [address], [place], [place] [address]  Urine Culture (for pregnant, neutropenic or urologic patients or patients with an indwelling urinary catheter)     Status: None   Collection Time: 03/02/24 10:11 PM   Specimen: Urine, Clean Catch  Result Value Ref Range Status   Specimen Description URINE, CLEAN CATCH  Final   Special Requests NONE  Final   Culture   Final    NO GROWTH Performed at [facility], [address], [place], [place] [address]    Report Status 03/04/2024 FINAL  Final  Aerobic/Anaerobic Culture w Gram Stain (surgical/deep wound)     Status: None (Preliminary result)   Collection Time: 03/03/24  8:26 AM   Specimen: Abscess  Result Value Ref Range Status   Specimen Description ABSCESS  Final   Special Requests GALLBLADDER  Final   Gram Stain   Final    ABUNDANT WBC PRESENT, PREDOMINANTLY PMN RARE GRAM NEGATIVE RODS Performed at [facility], [address],  [place], [place] [address]    Culture   Final    ABUNDANT ESCHERICHIA COLI NO ANAEROBES ISOLATED; CULTURE IN PROGRESS FOR 5 DAYS    Report Status PENDING  Incomplete   Organism ID, Bacteria ESCHERICHIA COLI  Final      Susceptibility   Escherichia coli - MIC*    AMPICILLIN >=32 RESISTANT Resistant     CEFAZOLIN  (NON-URINE) 2 SENSITIVE Sensitive     CEFEPIME <=0.12 SENSITIVE Sensitive     ERTAPENEM <=0.12 SENSITIVE Sensitive     CEFTRIAXONE <=0.25 SENSITIVE Sensitive     CIPROFLOXACIN 0.5 INTERMEDIATE Intermediate     GENTAMICIN <=1 SENSITIVE Sensitive     MEROPENEM <=0.25 SENSITIVE Sensitive     TRIMETH/SULFA <=20 SENSITIVE Sensitive     AMPICILLIN/SULBACTAM 8 SENSITIVE Sensitive     PIP/TAZO Value in next row Sensitive      <=4 SENSITIVEThis is Mansel Strother modified FDA-approved test that has been validated and its performance  characteristics determined by the reporting laboratory.  This laboratory is certified under the Clinical Laboratory Improvement Amendments CLIA as qualified to perform high complexity clinical laboratory testing.    * ABUNDANT ESCHERICHIA COLI         Radiology Studies: IR NON-TUNNELED CENTRAL VENOUS CATH [facility] W IMG Result Date: 03/05/2024 EXAM: Central venous catheter placement (non-tunneled) COMPARISON: none CLINICAL HISTORY: renal failure, needs access for dialysis. Complications: No immediate complications. Conscious Sedation: none Radiation dose reduction techniques were utilized as appropriate. Radiation exposure index (as provided by the fluoroscopic device): 1.6 mGy air kerma. Discussion: Time out performed including verification of patient, date of birth, procedure, and access site as appropriate. Informed written consent was obtained. The access site was prepared and draped using maximal sterile barrier technique including cutaneous antisepsis. The patient was positioned supine. Initial imaging was performed. Venous access: Access site: internal jugular Laterality: Right Indication for catheter placement: Dialysis Local anesthesia was administered. Under ultrasound guidance, venous access was obtained. Iley Breeden guidewire was advanced and Terelle Dobler non-tunneled central venous catheter was placed using standard Seldinger technique. Catheter details: Catheter type: Trialysis Catheter length: 16cm Catheter tip position confirmed with imaging. The catheter was secured and Baelyn Doring sterile dressing was applied. All lumens aspirated and flushed appropriately. Post-procedure imaging: Immediate post-placement imaging: Fluoroscopy Post-procedure imaging findings: tip proximal RA Radiation exposure index (as provided by the fluoroscopic device): 1.6 mgy air kerma Contrast: Contrast agent: None. Contrast volume: 0 mL. Estimated blood loss: Less than 10 mL. IMPRESSION: 1. Technically successful placement of Tally Mattox non-tunneled central  venous dialysis catheter. 2. No immediate complications. Electronically signed by: Dayne Hassell MD 03/05/2024 09:46 AM EST RP Workstation: [id number]        Scheduled Meds:  Chlorhexidine  Gluconate Cloth  6 each Topical Q0600   heparin   5,000 Units Subcutaneous Q8H   lidocaine   1 patch Transdermal Q24H   metoprolol  succinate  25 mg Oral Daily   sodium chloride  flush  5 mL Intracatheter Q8H   Continuous Infusions:  piperacillin -tazobactam (ZOSYN )  IV 3.375 g (03/06/24 1211)     LOS: 5 days    Time spent: over 30 min     Meliton Monte, MD [facility]   To contact the attending provider between 7A-7P or the covering provider during after hours 7P-7A, please log into the web site [email or web address] and access using universal [place] password for that web site. If you do not have the password, please call the hospital operator.  03/06/2024, 6:45 PM    "

## 2024-03-06 NOTE — Plan of Care (Signed)
   Problem: Education: Goal: Knowledge of General Education information will improve Description: Including pain rating scale, medication(s)/side effects and non-pharmacologic comfort measures Outcome: Progressing   Problem: Clinical Measurements: Goal: Ability to maintain clinical measurements within normal limits will improve Outcome: Progressing Goal: Diagnostic test results will improve Outcome: Progressing

## 2024-03-07 DIAGNOSIS — N179 Acute kidney failure, unspecified: Secondary | ICD-10-CM

## 2024-03-07 DIAGNOSIS — Z9189 Other specified personal risk factors, not elsewhere classified: Secondary | ICD-10-CM | POA: Diagnosis not present

## 2024-03-07 DIAGNOSIS — I4729 Other ventricular tachycardia: Secondary | ICD-10-CM | POA: Diagnosis not present

## 2024-03-07 DIAGNOSIS — Z79899 Other long term (current) drug therapy: Secondary | ICD-10-CM

## 2024-03-07 DIAGNOSIS — I493 Ventricular premature depolarization: Secondary | ICD-10-CM | POA: Diagnosis not present

## 2024-03-07 LAB — RENAL FUNCTION PANEL
Albumin: 2.7 g/dL — ABNORMAL LOW (ref 3.5–5.0)
Anion gap: 12 (ref 5–15)
BUN: 50 mg/dL — ABNORMAL HIGH (ref 8–23)
CO2: 24 mmol/L (ref 22–32)
Calcium: 7.7 mg/dL — ABNORMAL LOW (ref 8.9–10.3)
Chloride: 102 mmol/L (ref 98–111)
Creatinine, Ser: 4.97 mg/dL — ABNORMAL HIGH (ref 0.61–1.24)
GFR, Estimated: 11 mL/min — ABNORMAL LOW
Glucose, Bld: 105 mg/dL — ABNORMAL HIGH (ref 70–99)
Phosphorus: 3.4 mg/dL (ref 2.5–4.6)
Potassium: 3.3 mmol/L — ABNORMAL LOW (ref 3.5–5.1)
Sodium: 138 mmol/L (ref 135–145)

## 2024-03-07 LAB — CBC WITH DIFFERENTIAL/PLATELET
Abs Immature Granulocytes: 0.1 K/uL — ABNORMAL HIGH (ref 0.00–0.07)
Basophils Absolute: 0 K/uL (ref 0.0–0.1)
Basophils Relative: 0 %
Eosinophils Absolute: 0 K/uL (ref 0.0–0.5)
Eosinophils Relative: 1 %
HCT: 32.3 % — ABNORMAL LOW (ref 39.0–52.0)
Hemoglobin: 11 g/dL — ABNORMAL LOW (ref 13.0–17.0)
Immature Granulocytes: 1 %
Lymphocytes Relative: 13 %
Lymphs Abs: 1.1 K/uL (ref 0.7–4.0)
MCH: 29.3 pg (ref 26.0–34.0)
MCHC: 34.1 g/dL (ref 30.0–36.0)
MCV: 85.9 fL (ref 80.0–100.0)
Monocytes Absolute: 0.6 K/uL (ref 0.1–1.0)
Monocytes Relative: 8 %
Neutro Abs: 6.3 K/uL (ref 1.7–7.7)
Neutrophils Relative %: 77 %
Platelets: 88 K/uL — ABNORMAL LOW (ref 150–400)
RBC: 3.76 MIL/uL — ABNORMAL LOW (ref 4.22–5.81)
RDW: 14.7 % (ref 11.5–15.5)
WBC: 8.1 K/uL (ref 4.0–10.5)
nRBC: 0 % (ref 0.0–0.2)

## 2024-03-07 LAB — LACTIC ACID, PLASMA
Lactic Acid, Venous: 3.8 mmol/L (ref 0.5–1.9)
Lactic Acid, Venous: 2.5 mmol/L (ref 0.5–1.9)

## 2024-03-07 LAB — MAGNESIUM: Magnesium: 1.8 mg/dL (ref 1.7–2.4)

## 2024-03-07 MED ORDER — LACTATED RINGERS IV SOLN: INTRAVENOUS | Status: DC

## 2024-03-07 MED ORDER — LACTATED RINGERS IV BOLUS
500.0000 mL | Freq: Once | INTRAVENOUS | Status: AC
  Administered 2024-03-07: 500 mL via INTRAVENOUS

## 2024-03-07 MED ORDER — POTASSIUM CHLORIDE CRYS ER 20 MEQ PO TBCR
40.0000 meq | EXTENDED_RELEASE_TABLET | Freq: Once | ORAL | Status: AC
  Administered 2024-03-07: 40 meq via ORAL
  Filled 2024-03-07: qty 2

## 2024-03-07 NOTE — Progress Notes (Signed)
 Date and time results received: 03/07/2024 0710 (use smartphrase .now to insert current time)  Test: Lactic acid  Critical Value: 3.8  Name of Provider Notified: Cosette Blackwater  Orders Received? Or Actions Taken? See orders

## 2024-03-07 NOTE — Progress Notes (Signed)
 " PROGRESS NOTE    [name]  FMW:[id number] DOB: 11-12-1945 DOA: 03/01/2024 PCP: Kennyth Worth HERO, MD  Subjective: No acute events overnight. Seen and examined at bedside. Reports feeling better. Reports having a lot of output from the drain. Denies nausea, vomiting, constipation.   Hospital Course:    78 year old male with PMHx of HTN and HLD who presented to the ED on 03/01/2024 with several days of nausea, vomiting, and diarrhea. Symptoms began on 12/14 after returning from church and eating a frozen [employer] chicken meal, followed about an hour later by chills/subjective fevers with rigors, then profuse emesis and subsequent multiple episodes of loose watery stools. Emesis progressed from food contents to watery, nonbloody. Stools watery without blood or mucus. He reports poor p.o. intake since Sunday and generalized, nonfocal abdominal discomfort. Also notes markedly decreased urine output over the past 2 days with dark orange urine, without gross hematuria. In the ED he was found to have AKI and hypokalemia, with CT abdomen findings concerning for acute cholecystitis. He was admitted to the hospital service for further management. Now s/p perc chole tube. Has temp HD cath and is getting HD per renal. Dispo pending renal recovery at this point.   Assessment and Plan:  AKI on CKD stage IIIa  Oliguria High anion gap metabolic acidosis - Likely prerenal in the setting of decreased PO intake and increased GI losses with concomitant Losartan  use. Nephrology suspecting ATN given no obstruction.  - Cr peaked to 7.9 12/23 - now s/p temp HD cath  - dialysis as per nephro recs - nephrology following   Acute hypoxic respiratory failure  Hypervolemia - Currently on 4L , not on O2 at baseline with no known history of CHF. Currently net positive. Patient became tachypneic and hypoxic to 88% on RA likely secondary to volume overload on 12/22.  - Echo (12/22) showed LVEF 55-60% with no  RWMA, mild concentric LVH, G1DD, normal RV function, no valvular abnormalities - CXR elevated R hemidiaphragm with R basilar opacity (atelectasis vs PNA) and L basilar atelectasis, rounded R paratracheal density (tortuous vessel vs adenopathy), recommended CT chest w/ contrast. No effusion or PTX.  - UF with HD sessions as per nephro recs - continue to wean oxygen as tolerated  - nephrology following   Sepsis Acute cholecystitis - Has RUQ pain on palpation with CT evidence of acute cholecystitis without CBD dilation - now s/p perc chole drain 12/21 - culture growing e. Coli  - Continue IV Zosyn . Plan to finish 5 days post-drain placement on 12/26 - Needs follow up in general surgery office in 6 weeks to discuss interval cholecystectomy.  -  Needs follow up with IR to have cholangiogram before his appt.  Watch for high output. - General surgery following    PVCs  NSVT - Tele alerted to multiple runs of VT on 12/21 during which patient was asymptomatic and HDS. - Patient with known history of high PVC burden. - Cardiology consulted on 12/21, determined NSVT with elevated burden of PVC worsened by ongoing acute illness, recommended echo and electrolyte replacement - Echo without concerning findings, as noted above - Continue Toprol -XL 25 mg (note patient's home prescription is for 25 mg daily but he reports taking 12.5 mg daily) - start amiodarone - monitor LFTs, If worsening, stop amiodarone - cardiology has signed off - last note 12/25 - will need to follow up with cardiology outpatient for non-invasive ischemic evaluation   Gastroenteritis - Patient presented with N/V and  diarrhea which started after eating frozen meal - No further episodes of diarrhea, nausea, or vomiting.  Tolerating renal diet - GIPP and C diff PCR negative - Continue conservative management - Abx as elsewhere for acute cholecystitis - Advance diet as tolerated - PRN Zofran   - PRN imodium    UTI ruled out -  Urine culture showed no growth - Abx as elsewhere for acute cholecystitis   Hypokalemia  Resolved with supplementation Monitor and replete electrolytes as needed   Thrombocytopenia - Platelets decreased but relatively stable - Likely reactive to ongoing acute illness - Continue to monitor   Intermittent hypotension HTN - low BP likely due to severe sepsis - Continue holding home Losartan  (also in setting of AKI) - Continue Troprol-XL 25 mg daily   Left renal mass - CT abd/pel showed soft tissue mass on left kidney - Will need MRI with contrast per renal mass protocol as outpatient   Abnormal CXR Right paratracheal density, tortuous vasculature vs adenopathy, needs CT with contrast when able - currently on hold with AKI   Transaminitis Direct Hyperbilirubinemia  - improving - Possibly related to acute illness in the setting of cholecystitis -  AST, ALT, and ALP normalized - T. Bili improving   HLD - Statin held due to transaminitis  DVT prophylaxis: heparin  injection 5,000 Units Start: 03/01/24 2215  SQ Heparin    Code Status: Full Code  Disposition Plan: Home Reason for continuing need for hospitalization: IV antibiotics, new dialysis, nephrology recommendations  Objective: Vitals:   03/07/24 0023 03/07/24 0445 03/07/24 0749 03/07/24 1145  BP: 138/72 112/65  100/69  Pulse: 78 (!) 30  90  Resp: 20 20  18   Temp: 97.9 F (36.6 C) 97.6 F (36.4 C) (!) 97.3 F (36.3 C) 97.6 F (36.4 C)  TempSrc: Oral Oral Oral Oral  SpO2: 92% 93% 92% 94%  Weight:      Height:        Intake/Output Summary (Last 24 hours) at 03/07/2024 1446 Last data filed at 03/07/2024 1235 Gross per 24 hour  Intake 370 ml  Output 2075 ml  Net -1705 ml   Filed Weights   03/01/24 2109 03/06/24 0836 03/06/24 1128  Weight: 86.7 kg 89.2 kg 89.5 kg    Examination:  Physical Exam Vitals and nursing note reviewed.  Constitutional:      General: He is not in acute distress.     Appearance: He is ill-appearing.     Comments: Weak, frail  HENT:     Head: Normocephalic and atraumatic.  Cardiovascular:     Rate and Rhythm: Normal rate and regular rhythm.     Pulses: Normal pulses.     Heart sounds: Normal heart sounds.  Pulmonary:     Effort: Pulmonary effort is normal.     Breath sounds: Normal breath sounds.  Abdominal:     General: Bowel sounds are normal.     Palpations: Abdomen is soft.     Comments: R flank drain in place with biliary output in bag  Neurological:     Mental Status: He is alert. Mental status is at baseline.     Data Reviewed: I have personally reviewed following labs and imaging studies  CBC: Recent Labs  Lab 03/03/24 0401 03/04/24 0244 03/05/24 0344 03/06/24 0300 03/07/24 0500  WBC 16.7* 12.8* 9.0 6.5 8.1  NEUTROABS  --   --   --   --  6.3  HGB 14.4 14.0 12.7* 11.9* 11.0*  HCT 42.0 41.8 38.3* 35.2*  32.3*  MCV 85.2 86.7 88.9 85.2 85.9  PLT 92* 95* 100* 94* 88*   Basic Metabolic Panel: Recent Labs  Lab 03/01/24 1508 03/01/24 2223 03/02/24 0252 03/03/24 0401 03/03/24 1324 03/04/24 0244 03/05/24 0345 03/06/24 0300 03/07/24 0500  NA  --   --  135 135  --  137 136 136 138  K  --   --  3.4* 3.6  --  4.0 4.0 3.4* 3.3*  CL  --   --  102 101  --  101 103 102 102  CO2  --   --  16* 18*  --  18* 14* 22 24  GLUCOSE  --   --  87 94  --  92 77 110* 105*  BUN  --   --  74* 79*  --  88* 96* 70* 50*  CREATININE  --    < > 5.97* 6.90*  --  7.41* 7.94* 6.23* 4.97*  CALCIUM   --   --  8.0* 8.4*  --  8.4* 7.9* 7.7* 7.7*  MG 2.5*  --  2.2  --  2.0  --   --   --  1.8  PHOS  --   --   --  3.3  --   --  5.7* 4.1 3.4   < > = values in this interval not displayed.   GFR: Estimated Creatinine Clearance: 14.3 mL/min (A) (by C-G formula based on SCr of 4.97 mg/dL (H)). Liver Function Tests: Recent Labs  Lab 03/02/24 0252 03/03/24 0401 03/04/24 0244 03/05/24 0344 03/05/24 0345 03/06/24 0300 03/07/24 0500  AST 61* 45* 31 25  --  22   --   ALT 39 34 27 20  --  16  --   ALKPHOS 142* 139* 108 87  --  78  --   BILITOT 2.1* 2.1* 1.7* 1.3*  --  1.3*  --   PROT 5.9* 6.3* 6.1* 5.8*  --  5.7*  --   ALBUMIN 2.6* 2.8*  2.8* 2.7* 2.5* 2.5* 2.6*  2.6* 2.7*   Recent Labs  Lab 03/01/24 1507  LIPASE 97*   No results for input(s): AMMONIA in the last 168 hours. Coagulation Profile: Recent Labs  Lab 03/02/24 1338  INR 1.0   Cardiac Enzymes: No results for input(s): CKTOTAL, CKMB, CKMBINDEX, TROPONINI in the last 168 hours. ProBNP, BNP (last 5 results) Recent Labs    03/04/24 1129  PROBNP 18,836.0*   HbA1C: No results for input(s): HGBA1C in the last 72 hours. CBG: No results for input(s): GLUCAP in the last 168 hours. Lipid Profile: No results for input(s): CHOL, HDL, LDLCALC, TRIG, CHOLHDL, LDLDIRECT in the last 72 hours. Thyroid  Function Tests: No results for input(s): TSH, T4TOTAL, FREET4, T3FREE, THYROIDAB in the last 72 hours. Anemia Panel: No results for input(s): VITAMINB12, FOLATE, FERRITIN, TIBC, IRON, RETICCTPCT in the last 72 hours. Sepsis Labs: Recent Labs  Lab 03/01/24 1741 03/01/24 1940  LATICACIDVEN 1.5 1.3    Recent Results (from the past 240 hours)  Resp panel by RT-PCR (RSV, Flu A&B, Covid) Anterior Nasal Swab     Status: None   Collection Time: 03/01/24  3:08 PM   Specimen: Anterior Nasal Swab  Result Value Ref Range Status   SARS Coronavirus 2 by RT PCR NEGATIVE NEGATIVE Final    Comment: (NOTE) SARS-CoV-2 target nucleic acids are NOT DETECTED.  The SARS-CoV-2 RNA is generally detectable in upper respiratory specimens during the acute phase of infection. The lowest concentration of SARS-CoV-2 viral  copies this assay can detect is 138 copies/mL. A negative result does not preclude SARS-Cov-2 infection and should not be used as the sole basis for treatment or other patient management decisions. A negative result may occur with  improper  specimen collection/handling, submission of specimen other than nasopharyngeal swab, presence of viral mutation(s) within the areas targeted by this assay, and inadequate number of viral copies(<138 copies/mL). A negative result must be combined with clinical observations, patient history, and epidemiological information. The expected result is Negative.  Fact Sheet for Patients:  bloggercourse.com  Fact Sheet for Healthcare Providers:  seriousbroker.it  This test is no t yet approved or cleared by the [place]  FDA and  has been authorized for detection and/or diagnosis of SARS-CoV-2 by FDA under an Emergency Use Authorization (EUA). This EUA will remain  in effect (meaning this test can be used) for the duration of the COVID-19 declaration under Section 564(b)(1) of the Act, 21 U.S.C.section 360bbb-3(b)(1), unless the authorization is terminated  or revoked sooner.       Influenza A by PCR NEGATIVE NEGATIVE Final   Influenza B by PCR NEGATIVE NEGATIVE Final    Comment: (NOTE) The Xpert Xpress SARS-CoV-2/FLU/RSV plus assay is intended as an aid in the diagnosis of influenza from Nasopharyngeal swab specimens and should not be used as a sole basis for treatment. Nasal washings and aspirates are unacceptable for Xpert Xpress SARS-CoV-2/FLU/RSV testing.  Fact Sheet for Patients: bloggercourse.com  Fact Sheet for Healthcare Providers: seriousbroker.it  This test is not yet approved or cleared by the [place]  FDA and has been authorized for detection and/or diagnosis of SARS-CoV-2 by FDA under an Emergency Use Authorization (EUA). This EUA will remain in effect (meaning this test can be used) for the duration of the COVID-19 declaration under Section 564(b)(1) of the Act, 21 U.S.C. section 360bbb-3(b)(1), unless the authorization is terminated or revoked.     Resp  Syncytial Virus by PCR NEGATIVE NEGATIVE Final    Comment: (NOTE) Fact Sheet for Patients: bloggercourse.com  Fact Sheet for Healthcare Providers: seriousbroker.it  This test is not yet approved or cleared by the [place]  FDA and has been authorized for detection and/or diagnosis of SARS-CoV-2 by FDA under an Emergency Use Authorization (EUA). This EUA will remain in effect (meaning this test can be used) for the duration of the COVID-19 declaration under Section 564(b)(1) of the Act, 21 U.S.C. section 360bbb-3(b)(1), unless the authorization is terminated or revoked.  Performed at [facility], [address] [place], [place] [address]   Blood culture (routine x 2)     Status: None   Collection Time: 03/01/24  5:25 PM   Specimen: BLOOD  Result Value Ref Range Status   Specimen Description   Final    BLOOD RIGHT ANTECUBITAL Performed at [facility], [address] [place], [place] [address]    Special Requests   Final    BOTTLES DRAWN AEROBIC AND ANAEROBIC Blood Culture adequate volume Performed at [facility], [address] [place], [place] [address]    Culture   Final    NO GROWTH 5 DAYS Performed at [facility], [address], [place], [place] [address]    Report Status 03/06/2024 FINAL  Final  Blood culture (routine x 2)     Status: None   Collection Time: 03/01/24  5:41 PM   Specimen: BLOOD RIGHT FOREARM  Result Value Ref Range Status   Specimen  Description   Final    BLOOD RIGHT FOREARM Performed at [facility], [address] [place], [place] [address]    Special Requests   Final    BOTTLES DRAWN AEROBIC AND ANAEROBIC Blood Culture adequate volume Performed at [facility], [address] [place] [address] [place], [place] [address]    Culture   Final    NO GROWTH 5 DAYS Performed at [facility], [address], [place], [place] [address]     Report Status 03/06/2024 FINAL  Final  Gastrointestinal Panel by PCR , Stool     Status: None   Collection Time: 03/01/24 10:05 PM   Specimen: Stool  Result Value Ref Range Status   Campylobacter species NOT DETECTED NOT DETECTED Final   Plesimonas shigelloides NOT DETECTED NOT DETECTED Final   Salmonella species NOT DETECTED NOT DETECTED Final   Yersinia enterocolitica NOT DETECTED NOT DETECTED Final   Vibrio species NOT DETECTED NOT DETECTED Final   Vibrio cholerae NOT DETECTED NOT DETECTED Final   Enteroaggregative E coli (EAEC) NOT DETECTED NOT DETECTED Final   Enteropathogenic E coli (EPEC) NOT DETECTED NOT DETECTED Final   Enterotoxigenic E coli (ETEC) NOT DETECTED NOT DETECTED Final   Shiga like toxin producing E coli (STEC) NOT DETECTED NOT DETECTED Final   Shigella/Enteroinvasive E coli (EIEC) NOT DETECTED NOT DETECTED Final   Cryptosporidium NOT DETECTED NOT DETECTED Final   Cyclospora cayetanensis NOT DETECTED NOT DETECTED Final   Entamoeba histolytica NOT DETECTED NOT DETECTED Final   Giardia lamblia NOT DETECTED NOT DETECTED Final   Adenovirus F40/41 NOT DETECTED NOT DETECTED Final   Astrovirus NOT DETECTED NOT DETECTED Final   Norovirus GI/GII NOT DETECTED NOT DETECTED Final   Rotavirus A NOT DETECTED NOT DETECTED Final   Sapovirus (I, II, IV, and V) NOT DETECTED NOT DETECTED Final    Comment: Performed at [facility], [address] [place], [place] [address]  C Difficile Quick Screen w PCR reflex     Status: None   Collection Time: 03/01/24 10:05 PM   Specimen: Stool  Result Value Ref Range Status   C Diff antigen NEGATIVE NEGATIVE Final   C Diff toxin NEGATIVE NEGATIVE Final   C Diff interpretation No C. difficile detected.  Final    Comment: Performed at [facility], [address] [place], [place] [address]  Urine Culture (for pregnant, neutropenic or urologic patients or patients with an indwelling urinary catheter)     Status: None    Collection Time: 03/02/24 10:11 PM   Specimen: Urine, Clean Catch  Result Value Ref Range Status   Specimen Description URINE, CLEAN CATCH  Final   Special Requests NONE  Final   Culture   Final    NO GROWTH Performed at [facility], [address], [place], [place] [address]    Report Status 03/04/2024 FINAL  Final  Aerobic/Anaerobic Culture w Gram Stain (surgical/deep wound)     Status: None (Preliminary result)   Collection Time: 03/03/24  8:26 AM   Specimen: Abscess  Result Value Ref Range Status   Specimen Description ABSCESS  Final   Special Requests GALLBLADDER  Final   Gram Stain   Final    ABUNDANT WBC PRESENT, PREDOMINANTLY PMN RARE GRAM NEGATIVE RODS Performed at [facility], [address], [place], [place] [address]    Culture   Final    ABUNDANT ESCHERICHIA COLI NO ANAEROBES ISOLATED; CULTURE IN  PROGRESS FOR 5 DAYS    Report Status PENDING  Incomplete   Organism ID, Bacteria ESCHERICHIA COLI  Final      Susceptibility   Escherichia coli - MIC*    AMPICILLIN >=32 RESISTANT Resistant     CEFAZOLIN  (NON-URINE) 2 SENSITIVE Sensitive     CEFEPIME <=0.12 SENSITIVE Sensitive     ERTAPENEM <=0.12 SENSITIVE Sensitive     CEFTRIAXONE <=0.25 SENSITIVE Sensitive     CIPROFLOXACIN 0.5 INTERMEDIATE Intermediate     GENTAMICIN <=1 SENSITIVE Sensitive     MEROPENEM <=0.25 SENSITIVE Sensitive     TRIMETH/SULFA <=20 SENSITIVE Sensitive     AMPICILLIN/SULBACTAM 8 SENSITIVE Sensitive     PIP/TAZO Value in next row Sensitive      <=4 SENSITIVEThis is a modified FDA-approved test that has been validated and its performance characteristics determined by the reporting laboratory.  This laboratory is certified under the Clinical Laboratory Improvement Amendments CLIA as qualified to perform high complexity clinical laboratory testing.    * ABUNDANT ESCHERICHIA COLI     Radiology Studies: No results found.  Scheduled Meds:  Chlorhexidine  Gluconate Cloth  6 each  Topical Q0600   heparin   5,000 Units Subcutaneous Q8H   lidocaine   1 patch Transdermal Q24H   metoprolol  succinate  25 mg Oral Daily   sodium chloride  flush  5 mL Intracatheter Q8H   Continuous Infusions:  piperacillin -tazobactam (ZOSYN )  IV 3.375 g (03/07/24 1019)     LOS: 6 days   Norval Bar, MD  [facility]  03/07/2024, 2:46 PM   "

## 2024-03-07 NOTE — Progress Notes (Signed)
" °  [facility] Progress Note    Assessment/ Plan:   AKI on CKD3a, oliguric -baseline Cr around 1.1-1.3 with AKI likely related to ATN - Has had some dehydration with attempted IV fluids but creatinine continued to rise despite hydration - No obvious uremic symptoms at this time - He went dialysis on 12/23 and 12/24.  No dialysis today.  Some urine output some monitor closely for recovery.  Consider dialysis tomorrow based on progress   Acute cholecystitis -per primary service. Surgery following -s/p choley drain 12/21 with IR   Sepsis -secondary to gastroenteritis +/- cholecystitis - Antibiotics per primary.  Now much improved   Left renal mass -recommend MRI with and without contrast as outpatient. If here longer than expected then can be done prior to discharge, will need to see urology if mass is suspicious for RCC   HTN -BP soft, hold anti-HTNs   Thrombocytopenia -work up per primary    Subjective:   Patient feels okay today but does have some mild abdominal fullness.  No constipation that he is aware of.  Dialysis went well yesterday.     Objective:   BP 112/65 (BP Location: Right Arm)   Pulse (!) 30   Temp (!) 97.3 F (36.3 C) (Oral)   Resp 20   Ht 6' 1 (1.854 m)   Wt 89.5 kg   SpO2 92%   BMI 26.03 kg/m   Intake/Output Summary (Last 24 hours) at 03/07/2024 1019 Last data filed at 03/07/2024 9087 Gross per 24 hour  Intake 370 ml  Output 2075 ml  Net -1705 ml   Weight change:   Physical Exam: Gen: Lying in bed in no distress CVS: Normal rate, no rub Resp: Bilateral chest rise with no increased work of breathing Abd: +choley drain, moderately distended Ext: no edema Neuro: awake, alert  Imaging: No results found.   Labs: BMET Recent Labs  Lab 03/01/24 1507 03/01/24 2223 03/02/24 0252 03/03/24 0401 03/04/24 0244 03/05/24 0345 03/06/24 0300 03/07/24 0500  NA 135  --  135 135 137 136 136 138  K 2.9*  --  3.4* 3.6 4.0 4.0  3.4* 3.3*  CL 95*  --  102 101 101 103 102 102  CO2 25  --  16* 18* 18* 14* 22 24  GLUCOSE 105*  --  87 94 92 77 110* 105*  BUN 70*  --  74* 79* 88* 96* 70* 50*  CREATININE 6.00* 5.87* 5.97* 6.90* 7.41* 7.94* 6.23* 4.97*  CALCIUM  8.7*  --  8.0* 8.4* 8.4* 7.9* 7.7* 7.7*  PHOS  --   --   --  3.3  --  5.7* 4.1 3.4   CBC Recent Labs  Lab 03/04/24 0244 03/05/24 0344 03/06/24 0300 03/07/24 0500  WBC 12.8* 9.0 6.5 8.1  NEUTROABS  --   --   --  6.3  HGB 14.0 12.7* 11.9* 11.0*  HCT 41.8 38.3* 35.2* 32.3*  MCV 86.7 88.9 85.2 85.9  PLT 95* 100* 94* 88*    Medications:     Chlorhexidine  Gluconate Cloth  6 each Topical Q0600   heparin   5,000 Units Subcutaneous Q8H   lidocaine   1 patch Transdermal Q24H   metoprolol  succinate  25 mg Oral Daily   sodium chloride  flush  5 mL Intracatheter Q8H      "

## 2024-03-07 NOTE — Plan of Care (Signed)
  Problem: Education: Goal: Knowledge of General Education information will improve Description: Including pain rating scale, medication(s)/side effects and non-pharmacologic comfort measures Outcome: Progressing   Problem: Clinical Measurements: Goal: Will remain free from infection Outcome: Progressing   Problem: Activity: Goal: Risk for activity intolerance will decrease Outcome: Progressing   Problem: Coping: Goal: Level of anxiety will decrease Outcome: Progressing   Problem: Pain Managment: Goal: General experience of comfort will improve and/or be controlled Outcome: Progressing

## 2024-03-07 NOTE — Plan of Care (Signed)
   Problem: Education: Goal: Knowledge of General Education information will improve Description: Including pain rating scale, medication(s)/side effects and non-pharmacologic comfort measures Outcome: Progressing   Problem: Safety: Goal: Ability to remain free from injury will improve Outcome: Progressing

## 2024-03-07 NOTE — Progress Notes (Signed)
 "   Progress Note  Patient Name: [name] Date of Encounter: 03/07/2024  Primary Cardiologist: Shelda Bruckner, MD  Subjective   No symptoms.  Inpatient Medications    Scheduled Meds:  Chlorhexidine  Gluconate Cloth  6 each Topical Q0600   heparin   5,000 Units Subcutaneous Q8H   lidocaine   1 patch Transdermal Q24H   metoprolol  succinate  25 mg Oral Daily   sodium chloride  flush  5 mL Intracatheter Q8H   Continuous Infusions:  piperacillin -tazobactam (ZOSYN )  IV 3.375 g (03/06/24 2242)   PRN Meds: acetaminophen  **OR** acetaminophen , albuterol , HYDROmorphone  (DILAUDID ) injection, iohexol , loperamide , ondansetron  (ZOFRAN ) IV, oxyCODONE    Vital Signs    Vitals:   03/06/24 2008 03/07/24 0023 03/07/24 0445 03/07/24 0749  BP: (!) 114/94 138/72 112/65   Pulse: 78 78 (!) 30   Resp: 20 20 20    Temp: 97.9 F (36.6 C) 97.9 F (36.6 C) 97.6 F (36.4 C) (!) 97.3 F (36.3 C)  TempSrc: Oral Oral Oral Oral  SpO2: 94% 92% 93% 92%  Weight:      Height:        Intake/Output Summary (Last 24 hours) at 03/07/2024 1013 Last data filed at 03/07/2024 0912 Gross per 24 hour  Intake 370 ml  Output 2075 ml  Net -1705 ml   Filed Weights   03/01/24 2109 03/06/24 0836 03/06/24 1128  Weight: 86.7 kg 89.2 kg 89.5 kg    Telemetry     Personally reviewed.  Frequent PVCs, NSVT, couplets.  ECG    Not performed today.  Physical Exam   GEN: No acute distress.   Neck: No JVD. Cardiac: RRR, no murmur, rub, or gallop.  Respiratory: Nonlabored. Clear to auscultation bilaterally. GI: Soft, nontender, bowel sounds present. MS: No edema; No deformity. Neuro:  Nonfocal. Psych: Alert and oriented x 3. Normal affect.  Labs    Chemistry Recent Labs  Lab 03/04/24 0244 03/05/24 0344 03/05/24 0345 03/06/24 0300 03/07/24 0500  NA 137  --  136 136 138  K 4.0  --  4.0 3.4* 3.3*  CL 101  --  103 102 102  CO2 18*  --  14* 22 24  GLUCOSE 92  --  77 110* 105*  BUN 88*   --  96* 70* 50*  CREATININE 7.41*  --  7.94* 6.23* 4.97*  CALCIUM  8.4*  --  7.9* 7.7* 7.7*  PROT 6.1* 5.8*  --  5.7*  --   ALBUMIN 2.7* 2.5* 2.5* 2.6*  2.6* 2.7*  AST 31 25  --  22  --   ALT 27 20  --  16  --   ALKPHOS 108 87  --  78  --   BILITOT 1.7* 1.3*  --  1.3*  --   GFRNONAA 7*  --  6* 9* 11*  ANIONGAP 18*  --  19* 13 12     Hematology Recent Labs  Lab 03/05/24 0344 03/06/24 0300 03/07/24 0500  WBC 9.0 6.5 8.1  RBC 4.31 4.13* 3.76*  HGB 12.7* 11.9* 11.0*  HCT 38.3* 35.2* 32.3*  MCV 88.9 85.2 85.9  MCH 29.5 28.8 29.3  MCHC 33.2 33.8 34.1  RDW 15.5 15.3 14.7  PLT 100* 94* 88*    Cardiac EnzymesNo results for input(s): TROPONINIHS in the last 720 hours.  BNP Recent Labs  Lab 03/04/24 1129  PROBNP 18,836.0*     DDimerNo results for input(s): DDIMER in the last 168 hours.   Radiology    No results found.  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  Event monitor from Jan 2024 showed 25.5% PVC burden - Telemetry reviewed, has frequent PVCs, NSVT, couplets etc.  Burden could likely be more than 20%. - Never had any ischemia evaluation performed.  Does not have any angina. - Echocardiogram performed this admission showed LVEF 55 to 60%, G1 DD, normal LV function, no valvular heart disease and CVP 3 mmHg. - On metoprolol  succinate 12.5 mg daily at home, uptitrated to 25 mg once daily with no improvement in the PVC burden. - Start amiodarone 200 mg twice daily.  Monitor LFTs.  Prior transaminitis likely from cholecystitis.  AST/ALT within normal limits.  If LFTs are worsening, discontinue amiodarone. - He will need outpatient noninvasive ischemia evaluation, CT cardiac versus cardiac PET scan/CT (depending on HD situation). - Outpatient cardiology follow-up in 6 to 8 weeks.  Need to discuss noninvasive ischemia evaluation at that time.  He was also noted to have 2.5 x 2.7 cm left renal mass and  further evaluation with multiphasic contrast-enhanced MRI abdomen as per renal mass protocol was recommended.  Labs reveal thrombocytopenia, platelets 80-90k (prior platelets within normal limits, hopefully will improve after acute illness is resolved).  Challenging situation if he needs invasive ischemia evaluation in the future.   Acute cholecystitis s/p IR chole drain on 03/03/24: On antibiotics, management per primary team. 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  Hopefully he can come off of HD. Left renal mass: Outpatient MRI workup.   40 minutes spent in reviewing prior medical records, reports, more than the labs, discussion and documentation.  Complex decision making.   Signed, Diannah SHAUNNA Maywood, MD  03/07/2024, 10:13 AM    "

## 2024-03-08 ENCOUNTER — Inpatient Hospital Stay (HOSPITAL_COMMUNITY)

## 2024-03-08 DIAGNOSIS — D696 Thrombocytopenia, unspecified: Secondary | ICD-10-CM | POA: Diagnosis not present

## 2024-03-08 DIAGNOSIS — I493 Ventricular premature depolarization: Secondary | ICD-10-CM | POA: Diagnosis not present

## 2024-03-08 DIAGNOSIS — K810 Acute cholecystitis: Secondary | ICD-10-CM | POA: Diagnosis not present

## 2024-03-08 DIAGNOSIS — I959 Hypotension, unspecified: Secondary | ICD-10-CM

## 2024-03-08 DIAGNOSIS — N179 Acute kidney failure, unspecified: Secondary | ICD-10-CM | POA: Diagnosis not present

## 2024-03-08 DIAGNOSIS — I10 Essential (primary) hypertension: Secondary | ICD-10-CM | POA: Diagnosis not present

## 2024-03-08 LAB — RENAL FUNCTION PANEL
Albumin: 2.6 g/dL — ABNORMAL LOW (ref 3.5–5.0)
Anion gap: 12 (ref 5–15)
BUN: 61 mg/dL — ABNORMAL HIGH (ref 8–23)
CO2: 24 mmol/L (ref 22–32)
Calcium: 7.6 mg/dL — ABNORMAL LOW (ref 8.9–10.3)
Chloride: 102 mmol/L (ref 98–111)
Creatinine, Ser: 6.17 mg/dL — ABNORMAL HIGH (ref 0.61–1.24)
GFR, Estimated: 9 mL/min — ABNORMAL LOW
Glucose, Bld: 124 mg/dL — ABNORMAL HIGH (ref 70–99)
Phosphorus: 5.6 mg/dL — ABNORMAL HIGH (ref 2.5–4.6)
Potassium: 4 mmol/L (ref 3.5–5.1)
Sodium: 138 mmol/L (ref 135–145)

## 2024-03-08 LAB — HEMOGLOBIN AND HEMATOCRIT, BLOOD
HCT: 16.8 % — ABNORMAL LOW (ref 39.0–52.0)
HCT: 21.3 % — ABNORMAL LOW (ref 39.0–52.0)
Hemoglobin: 5.5 g/dL — CL (ref 13.0–17.0)
Hemoglobin: 7.2 g/dL — ABNORMAL LOW (ref 13.0–17.0)

## 2024-03-08 LAB — CBC
HCT: 18.4 % — ABNORMAL LOW (ref 39.0–52.0)
Hemoglobin: 6.2 g/dL — CL (ref 13.0–17.0)
MCH: 29.2 pg (ref 26.0–34.0)
MCHC: 33.7 g/dL (ref 30.0–36.0)
MCV: 86.8 fL (ref 80.0–100.0)
Platelets: 86 K/uL — ABNORMAL LOW (ref 150–400)
RBC: 2.12 MIL/uL — ABNORMAL LOW (ref 4.22–5.81)
RDW: 14.4 % (ref 11.5–15.5)
WBC: 10.9 K/uL — ABNORMAL HIGH (ref 4.0–10.5)
nRBC: 0 % (ref 0.0–0.2)

## 2024-03-08 LAB — HEPATIC FUNCTION PANEL
ALT: 24 U/L (ref 0–44)
AST: 43 U/L — ABNORMAL HIGH (ref 15–41)
Albumin: 2.6 g/dL — ABNORMAL LOW (ref 3.5–5.0)
Alkaline Phosphatase: 54 U/L (ref 38–126)
Bilirubin, Direct: 0.6 mg/dL — ABNORMAL HIGH (ref 0.0–0.2)
Indirect Bilirubin: 0.3 mg/dL (ref 0.3–0.9)
Total Bilirubin: 0.9 mg/dL (ref 0.0–1.2)
Total Protein: 5 g/dL — ABNORMAL LOW (ref 6.5–8.1)

## 2024-03-08 LAB — AEROBIC/ANAEROBIC CULTURE W GRAM STAIN (SURGICAL/DEEP WOUND)

## 2024-03-08 LAB — PREPARE RBC (CROSSMATCH)

## 2024-03-08 LAB — LACTIC ACID, PLASMA: Lactic Acid, Venous: 1.3 mmol/L (ref 0.5–1.9)

## 2024-03-08 MED ORDER — SODIUM CHLORIDE 0.9% IV SOLUTION: Freq: Once | INTRAVENOUS | Status: AC

## 2024-03-08 MED ORDER — LACTATED RINGERS IV BOLUS
500.0000 mL | Freq: Once | INTRAVENOUS | Status: AC
  Administered 2024-03-08: 500 mL via INTRAVENOUS

## 2024-03-08 MED ORDER — LACTATED RINGERS IV BOLUS: 500.0000 mL | Freq: Once | INTRAVENOUS | Status: DC

## 2024-03-08 MED ORDER — LACTATED RINGERS IV SOLN: INTRAVENOUS | Status: AC

## 2024-03-08 MED ORDER — IOHEXOL 350 MG/ML SOLN
75.0000 mL | Freq: Once | INTRAVENOUS | Status: AC | PRN
  Administered 2024-03-08: 75 mL via INTRAVENOUS

## 2024-03-08 MED ORDER — LIDOCAINE 5 % EX PTCH
1.0000 | MEDICATED_PATCH | CUTANEOUS | Status: AC
  Administered 2024-03-08: 1 via TRANSDERMAL
  Filled 2024-03-08: qty 1

## 2024-03-08 MED ORDER — LIDOCAINE 5 % EX PTCH
1.0000 | MEDICATED_PATCH | CUTANEOUS | Status: DC
  Administered 2024-03-09 – 2024-03-16 (×8): 1 via TRANSDERMAL
  Filled 2024-03-08 (×3): qty 1

## 2024-03-08 NOTE — Progress Notes (Signed)
 " PROGRESS NOTE    [name]  FMW:[id number] DOB: Aug 02, 1945 DOA: 03/01/2024 PCP: Kennyth Worth HERO, MD  Subjective: Noted to have dizziness with ambulation then found to have low BP late afternoon yesterday and overnight. As per nursing, BP remains low and biliary drain with some serosanguinous/bloody output around the site. Seen and examined at bedside. Reports feeling better now back in the bed with no further dizziness. Reports having generalized weakness/fatigue still. Reports has not been drinking a lot of water  as doesn't like the taste of it here but tolerating ice chips. Denies nausea, vomiting, constipation.  Hospital Course:  78 year old male with PMHx of HTN and HLD who presented to the ED on 03/01/2024 with several days of nausea, vomiting, and diarrhea. Symptoms began on 12/14 after returning from church and eating a frozen [employer] chicken meal, followed about an hour later by chills/subjective fevers with rigors, then profuse emesis and subsequent multiple episodes of loose watery stools. Emesis progressed from food contents to watery, nonbloody. Stools watery without blood or mucus. He reports poor p.o. intake since Sunday and generalized, nonfocal abdominal discomfort. Also notes markedly decreased urine output over the past 2 days with dark orange urine, without gross hematuria. In the ED he was found to have AKI and hypokalemia, with CT abdomen findings concerning for acute cholecystitis. He was admitted to the hospital service for further management. Now s/p perc chole tube. Has temp HD cath and is getting HD per renal. Dispo pending renal recovery at this point.    Assessment and Plan:  Hypotension History of HTN - low BP likely due to severe sepsis, biliary drain output and poor oral intake - Continue holding home Losartan  (also in setting of AKI) - hold Troprol-XL 25 mg daily - cont IV fluids - encourage oral intake - monitor clinically  Lactic acidosis -  likely due to hypovolemic/hypotension - IV fluids as elsewhere - antibiotics as elsewhere for acute cholecystitis - trend lactate  AKI on CKD stage IIIa  Oliguria High anion gap metabolic acidosis - Likely prerenal in the setting of decreased PO intake and increased GI losses with concomitant Losartan  use. Nephrology suspecting ATN given no obstruction.  - Cr peaked to 7.9 12/23 - now s/p temp HD cath  - dialysis as per nephro recs - nephrology following   Acute hypoxic respiratory failure  - Currently on 2 < 4L , not on O2 at baseline with no known history of CHF. Currently net positive. Patient became tachypneic and hypoxic to 88% on RA likely secondary to volume overload on 12/22.  - Echo (12/22) showed LVEF 55-60% with no RWMA, mild concentric LVH, G1DD, normal RV function, no valvular abnormalities - CXR elevated R hemidiaphragm with R basilar opacity (atelectasis vs PNA) and L basilar atelectasis, rounded R paratracheal density (tortuous vessel vs adenopathy), recommended CT chest w/ contrast. No effusion or PTX.  - UF with HD sessions as per nephro recs - continue to wean oxygen as tolerated  - nephrology following   Sepsis Acute cholecystitis - Has RUQ pain on palpation with CT evidence of acute cholecystitis without CBD dilation - now s/p perc chole drain 12/21 - culture growing e. Coli  - Continue IV Zosyn . Plan to finish 5 days post-drain placement to end on 12/26 - may need to touch base with IR to evaluate the biliary drain if bloody output continues - Needs follow up in general surgery office in 6 weeks to discuss interval cholecystectomy.  -  Needs  follow up with IR to have cholangiogram before his appt.  Watch for high output. - General surgery following - monitor CBC    PVCs  NSVT - Tele alerted to multiple runs of VT on 12/21 during which patient was asymptomatic and HDS. - Patient with known history of high PVC burden. - Cardiology consulted on 12/21,  determined NSVT with elevated burden of PVC worsened by ongoing acute illness, recommended echo and electrolyte replacement - Echo without concerning findings, as noted above - Continue Toprol -XL 25 mg (note patient's home prescription is for 25 mg daily but he reports taking 12.5 mg daily) - start amiodarone - monitor LFTs, If worsening, stop amiodarone - cardiology has signed off - last note 12/25 - will need to follow up with cardiology outpatient for non-invasive ischemic evaluation   Gastroenteritis - Patient presented with N/V and diarrhea which started after eating frozen meal - No further episodes of diarrhea, nausea, or vomiting.  Tolerating renal diet - GIPP and C diff PCR negative - Continue conservative management - Abx as elsewhere for acute cholecystitis - Advance diet as tolerated - PRN Zofran   - PRN imodium    UTI ruled out - Urine culture showed no growth - Abx as elsewhere for acute cholecystitis   Hypokalemia  Resolved with supplementation Monitor and replete electrolytes as needed   Thrombocytopenia - Platelets decreased but relatively stable - Likely reactive to ongoing acute illness - Continue to monitor   Left renal mass - CT abd/pel showed soft tissue mass on left kidney - Will need MRI with contrast per renal mass protocol as outpatient   Abnormal CXR Right paratracheal density, tortuous vasculature vs adenopathy, needs CT with contrast when able - currently on hold with AKI   Transaminitis Direct Hyperbilirubinemia  - improving - Possibly related to acute illness in the setting of cholecystitis -  AST, ALT, and ALP normalized - T. Bili improving   HLD - Statin held due to transaminitis  DVT prophylaxis: heparin  injection 5,000 Units Start: 03/01/24 2215  SQ Heparin    Code Status: Full Code  Disposition Plan: Home Reason for continuing need for hospitalization: IV antibiotics, IV fluids, monitor BP, new dialysis, nephrology recommendations    Objective: Vitals:   03/07/24 2015 03/07/24 2347 03/08/24 0408 03/08/24 0808  BP: (!) 93/58 (!) 81/61 (!) 90/46 (!) 84/64  Pulse: 90 90 89 97  Resp: 20 18 20 20   Temp: 97.9 F (36.6 C) (!) 97.5 F (36.4 C) (!) 97.4 F (36.3 C) 97.6 F (36.4 C)  TempSrc: Oral Oral Oral Oral  SpO2: 100% 95% 99% 100%  Weight:      Height:        Intake/Output Summary (Last 24 hours) at 03/08/2024 1056 Last data filed at 03/08/2024 0900 Gross per 24 hour  Intake 1230 ml  Output 975 ml  Net 255 ml   Filed Weights   03/01/24 2109 03/06/24 0836 03/06/24 1128  Weight: 86.7 kg 89.2 kg 89.5 kg    Examination:  Physical Exam Vitals and nursing note reviewed.  Constitutional:      General: He is not in acute distress.    Appearance: He is ill-appearing.     Comments: Weak, frail  HENT:     Head: Normocephalic and atraumatic.  Cardiovascular:     Rate and Rhythm: Normal rate and regular rhythm.     Pulses: Normal pulses.     Heart sounds: Normal heart sounds.  Pulmonary:     Effort: Pulmonary effort is  normal.     Breath sounds: Normal breath sounds.  Abdominal:     General: Bowel sounds are normal.     Palpations: Abdomen is soft.     Comments: Biliary drain in place with serosanguinous output on dressing, biliary output in collection bag noted  Neurological:     Mental Status: He is alert. Mental status is at baseline.     Data Reviewed: I have personally reviewed following labs and imaging studies  CBC: Recent Labs  Lab 03/03/24 0401 03/04/24 0244 03/05/24 0344 03/06/24 0300 03/07/24 0500  WBC 16.7* 12.8* 9.0 6.5 8.1  NEUTROABS  --   --   --   --  6.3  HGB 14.4 14.0 12.7* 11.9* 11.0*  HCT 42.0 41.8 38.3* 35.2* 32.3*  MCV 85.2 86.7 88.9 85.2 85.9  PLT 92* 95* 100* 94* 88*   Basic Metabolic Panel: Recent Labs  Lab 03/01/24 1508 03/01/24 2223 03/02/24 0252 03/03/24 0401 03/03/24 1324 03/04/24 0244 03/05/24 0345 03/06/24 0300 03/07/24 0500  NA  --   --  135  135  --  137 136 136 138  K  --   --  3.4* 3.6  --  4.0 4.0 3.4* 3.3*  CL  --   --  102 101  --  101 103 102 102  CO2  --   --  16* 18*  --  18* 14* 22 24  GLUCOSE  --   --  87 94  --  92 77 110* 105*  BUN  --   --  74* 79*  --  88* 96* 70* 50*  CREATININE  --    < > 5.97* 6.90*  --  7.41* 7.94* 6.23* 4.97*  CALCIUM   --   --  8.0* 8.4*  --  8.4* 7.9* 7.7* 7.7*  MG 2.5*  --  2.2  --  2.0  --   --   --  1.8  PHOS  --   --   --  3.3  --   --  5.7* 4.1 3.4   < > = values in this interval not displayed.   GFR: Estimated Creatinine Clearance: 14.3 mL/min (A) (by C-G formula based on SCr of 4.97 mg/dL (H)). Liver Function Tests: Recent Labs  Lab 03/02/24 0252 03/03/24 0401 03/04/24 0244 03/05/24 0344 03/05/24 0345 03/06/24 0300 03/07/24 0500  AST 61* 45* 31 25  --  22  --   ALT 39 34 27 20  --  16  --   ALKPHOS 142* 139* 108 87  --  78  --   BILITOT 2.1* 2.1* 1.7* 1.3*  --  1.3*  --   PROT 5.9* 6.3* 6.1* 5.8*  --  5.7*  --   ALBUMIN 2.6* 2.8*  2.8* 2.7* 2.5* 2.5* 2.6*  2.6* 2.7*   Recent Labs  Lab 03/01/24 1507  LIPASE 97*   No results for input(s): AMMONIA in the last 168 hours. Coagulation Profile: Recent Labs  Lab 03/02/24 1338  INR 1.0   Cardiac Enzymes: No results for input(s): CKTOTAL, CKMB, CKMBINDEX, TROPONINI in the last 168 hours. ProBNP, BNP (last 5 results) Recent Labs    03/04/24 1129  PROBNP 18,836.0*   HbA1C: No results for input(s): HGBA1C in the last 72 hours. CBG: No results for input(s): GLUCAP in the last 168 hours. Lipid Profile: No results for input(s): CHOL, HDL, LDLCALC, TRIG, CHOLHDL, LDLDIRECT in the last 72 hours. Thyroid  Function Tests: No results for input(s): TSH, T4TOTAL, FREET4, T3FREE,  THYROIDAB in the last 72 hours. Anemia Panel: No results for input(s): VITAMINB12, FOLATE, FERRITIN, TIBC, IRON, RETICCTPCT in the last 72 hours. Sepsis Labs: Recent Labs  Lab 03/01/24 1741  03/01/24 1940 03/07/24 1733 03/07/24 1946  LATICACIDVEN 1.5 1.3 3.8* 2.5*    Recent Results (from the past 240 hours)  Resp panel by RT-PCR (RSV, Flu A&B, Covid) Anterior Nasal Swab     Status: None   Collection Time: 03/01/24  3:08 PM   Specimen: Anterior Nasal Swab  Result Value Ref Range Status   SARS Coronavirus 2 by RT PCR NEGATIVE NEGATIVE Final    Comment: (NOTE) SARS-CoV-2 target nucleic acids are NOT DETECTED.  The SARS-CoV-2 RNA is generally detectable in upper respiratory specimens during the acute phase of infection. The lowest concentration of SARS-CoV-2 viral copies this assay can detect is 138 copies/mL. A negative result does not preclude SARS-Cov-2 infection and should not be used as the sole basis for treatment or other patient management decisions. A negative result may occur with  improper specimen collection/handling, submission of specimen other than nasopharyngeal swab, presence of viral mutation(s) within the areas targeted by this assay, and inadequate number of viral copies(<138 copies/mL). A negative result must be combined with clinical observations, patient history, and epidemiological information. The expected result is Negative.  Fact Sheet for Patients:  bloggercourse.com  Fact Sheet for Healthcare Providers:  seriousbroker.it  This test is no t yet approved or cleared by the [place]  FDA and  has been authorized for detection and/or diagnosis of SARS-CoV-2 by FDA under an Emergency Use Authorization (EUA). This EUA will remain  in effect (meaning this test can be used) for the duration of the COVID-19 declaration under Section 564(b)(1) of the Act, 21 U.S.C.section 360bbb-3(b)(1), unless the authorization is terminated  or revoked sooner.       Influenza A by PCR NEGATIVE NEGATIVE Final   Influenza B by PCR NEGATIVE NEGATIVE Final    Comment: (NOTE) The Xpert Xpress  SARS-CoV-2/FLU/RSV plus assay is intended as an aid in the diagnosis of influenza from Nasopharyngeal swab specimens and should not be used as a sole basis for treatment. Nasal washings and aspirates are unacceptable for Xpert Xpress SARS-CoV-2/FLU/RSV testing.  Fact Sheet for Patients: bloggercourse.com  Fact Sheet for Healthcare Providers: seriousbroker.it  This test is not yet approved or cleared by the [place]  FDA and has been authorized for detection and/or diagnosis of SARS-CoV-2 by FDA under an Emergency Use Authorization (EUA). This EUA will remain in effect (meaning this test can be used) for the duration of the COVID-19 declaration under Section 564(b)(1) of the Act, 21 U.S.C. section 360bbb-3(b)(1), unless the authorization is terminated or revoked.     Resp Syncytial Virus by PCR NEGATIVE NEGATIVE Final    Comment: (NOTE) Fact Sheet for Patients: bloggercourse.com  Fact Sheet for Healthcare Providers: seriousbroker.it  This test is not yet approved or cleared by the [place]  FDA and has been authorized for detection and/or diagnosis of SARS-CoV-2 by FDA under an Emergency Use Authorization (EUA). This EUA will remain in effect (meaning this test can be used) for the duration of the COVID-19 declaration under Section 564(b)(1) of the Act, 21 U.S.C. section 360bbb-3(b)(1), unless the authorization is terminated or revoked.  Performed at [facility], [address] [place], [place] [address]   Blood culture (routine x 2)     Status: None   Collection Time: 03/01/24  5:25 PM   Specimen: BLOOD  Result Value Ref Range Status   Specimen Description   Final    BLOOD RIGHT ANTECUBITAL Performed at [facility], [address] [place], [place] [address]    Special Requests   Final    BOTTLES DRAWN AEROBIC AND ANAEROBIC Blood Culture  adequate volume Performed at [facility], [address] [place], [place] [address]    Culture   Final    NO GROWTH 5 DAYS Performed at [facility], [address] [place], [place] [address]    Report Status 03/06/2024 FINAL  Final  Blood culture (routine x 2)     Status: None   Collection Time: 03/01/24  5:41 PM   Specimen: BLOOD RIGHT FOREARM  Result Value Ref Range Status   Specimen Description   Final    BLOOD RIGHT FOREARM Performed at [facility], [address] [place], [place] [address]    Special Requests   Final    BOTTLES DRAWN AEROBIC AND ANAEROBIC Blood Culture adequate volume Performed at [facility], [address] [place], [place] [address]    Culture   Final    NO GROWTH 5 DAYS Performed at [facility], [address], [place], [place] [address]    Report Status 03/06/2024 FINAL  Final  Gastrointestinal Panel by PCR , Stool     Status: None   Collection Time: 03/01/24 10:05 PM   Specimen: Stool  Result Value Ref Range Status   Campylobacter species NOT DETECTED NOT DETECTED Final   Plesimonas shigelloides NOT DETECTED NOT DETECTED Final   Salmonella species NOT DETECTED NOT DETECTED Final   Yersinia enterocolitica NOT DETECTED NOT DETECTED Final   Vibrio species NOT DETECTED NOT DETECTED Final   Vibrio cholerae NOT DETECTED NOT DETECTED Final   Enteroaggregative E coli (EAEC) NOT DETECTED NOT DETECTED Final   Enteropathogenic E coli (EPEC) NOT DETECTED NOT DETECTED Final   Enterotoxigenic E coli (ETEC) NOT DETECTED NOT DETECTED Final   Shiga like toxin producing E coli (STEC) NOT DETECTED NOT DETECTED Final   Shigella/Enteroinvasive E coli (EIEC) NOT DETECTED NOT DETECTED Final   Cryptosporidium NOT DETECTED NOT DETECTED Final   Cyclospora cayetanensis NOT DETECTED NOT DETECTED Final   Entamoeba histolytica NOT DETECTED NOT DETECTED Final   Giardia lamblia NOT DETECTED NOT DETECTED Final   Adenovirus  F40/41 NOT DETECTED NOT DETECTED Final   Astrovirus NOT DETECTED NOT DETECTED Final   Norovirus GI/GII NOT DETECTED NOT DETECTED Final   Rotavirus A NOT DETECTED NOT DETECTED Final   Sapovirus (I, II, IV, and V) NOT DETECTED NOT DETECTED Final    Comment: Performed at [facility], [address] [place], [place] [address]  C Difficile Quick Screen w PCR reflex     Status: None   Collection Time: 03/01/24 10:05 PM   Specimen: Stool  Result Value Ref Range Status   C Diff antigen NEGATIVE NEGATIVE Final   C Diff toxin NEGATIVE NEGATIVE Final   C Diff interpretation No C. difficile detected.  Final    Comment: Performed at [facility], [address], [place], [place] [address]  Urine Culture (for pregnant, neutropenic or urologic patients or patients with an indwelling urinary catheter)     Status: None   Collection Time: 03/02/24 10:11 PM   Specimen: Urine, Clean Catch  Result Value Ref Range Status   Specimen Description URINE, CLEAN CATCH  Final  Special Requests NONE  Final   Culture   Final    NO GROWTH Performed at [facility], [address], [place], [place] [address]    Report Status 03/04/2024 FINAL  Final  Aerobic/Anaerobic Culture w Gram Stain (surgical/deep wound)     Status: None (Preliminary result)   Collection Time: 03/03/24  8:26 AM   Specimen: Abscess  Result Value Ref Range Status   Specimen Description ABSCESS  Final   Special Requests GALLBLADDER  Final   Gram Stain   Final    ABUNDANT WBC PRESENT, PREDOMINANTLY PMN RARE GRAM NEGATIVE RODS Performed at [facility], [address], [place], [place] [address]    Culture   Final    ABUNDANT ESCHERICHIA COLI NO ANAEROBES ISOLATED; CULTURE IN PROGRESS FOR 5 DAYS    Report Status PENDING  Incomplete   Organism ID, Bacteria ESCHERICHIA COLI  Final      Susceptibility   Escherichia coli - MIC*    AMPICILLIN >=32 RESISTANT Resistant     CEFAZOLIN  (NON-URINE) 2 SENSITIVE  Sensitive     CEFEPIME <=0.12 SENSITIVE Sensitive     ERTAPENEM <=0.12 SENSITIVE Sensitive     CEFTRIAXONE <=0.25 SENSITIVE Sensitive     CIPROFLOXACIN 0.5 INTERMEDIATE Intermediate     GENTAMICIN <=1 SENSITIVE Sensitive     MEROPENEM <=0.25 SENSITIVE Sensitive     TRIMETH/SULFA <=20 SENSITIVE Sensitive     AMPICILLIN/SULBACTAM 8 SENSITIVE Sensitive     PIP/TAZO Value in next row Sensitive      <=4 SENSITIVEThis is a modified FDA-approved test that has been validated and its performance characteristics determined by the reporting laboratory.  This laboratory is certified under the Clinical Laboratory Improvement Amendments CLIA as qualified to perform high complexity clinical laboratory testing.    * ABUNDANT ESCHERICHIA COLI     Radiology Studies: No results found.  Scheduled Meds:  Chlorhexidine  Gluconate Cloth  6 each Topical Q0600   heparin   5,000 Units Subcutaneous Q8H   [START ON 03/09/2024] lidocaine   1 patch Transdermal Q24H   lidocaine   1 patch Transdermal STAT   sodium chloride  flush  5 mL Intracatheter Q8H   Continuous Infusions:  lactated ringers  250 mL/hr at 03/08/24 1049   lactated ringers      piperacillin -tazobactam (ZOSYN )  IV 3.375 g (03/08/24 1020)     LOS: 7 days   Norval Bar, MD  [facility]  03/08/2024, 10:56 AM   "

## 2024-03-08 NOTE — Plan of Care (Signed)
   Problem: Education: Goal: Knowledge of General Education information will improve Description: Including pain rating scale, medication(s)/side effects and non-pharmacologic comfort measures Outcome: Progressing   Problem: Clinical Measurements: Goal: Diagnostic test results will improve Outcome: Progressing   Problem: Safety: Goal: Ability to remain free from injury will improve Outcome: Progressing

## 2024-03-08 NOTE — Progress Notes (Signed)
" °  [facility] Progress Note    Assessment/ Plan:   AKI on CKD3a, oliguric -baseline Cr around 1.1-1.3 with AKI likely related to ATN - Has had some dehydration with attempted IV fluids but creatinine continued to rise despite hydration - No obvious uremic symptoms at this time; some nausea but may be related to the hypotension - He went dialysis on 12/23 and 12/24.  Some urine output some monitor closely for recovery.   - Will plan on dialysis tomorrow unless significant signs of renal recovery; he is very hypotensive today with marked worsening of anemia.  Repeat H&H being sent but likely will be significant anemia with his hypotension.     Acute cholecystitis -per primary service. Surgery following -s/p choley drain 12/21 with IR, bloody drainage   Sepsis -secondary to gastroenteritis +/- cholecystitis - Antibiotics per primary.  Now much improved   Left renal mass -recommend MRI with and without contrast as outpatient. If here longer than expected then can be done prior to discharge, will need to see urology if mass is suspicious for RCC   HTN -BP soft, holding  anti-HTNs   Thrombocytopenia -work up per primary    Subjective:   Patient feels more fatigued with intermittent nausea today; had a bowel movement yesterday denies any black tarry stools or bright red blood per rectum.  Noted bleeding around the drain site by RN.  Dizziness and SOB resolved when he is supine back in bed.       Objective:   BP (!) 76/57 (BP Location: Right Arm)   Pulse 97   Temp 97.6 F (36.4 C) (Oral)   Resp 20   Ht 6' 1 (1.854 m)   Wt 89.5 kg   SpO2 100%   BMI 26.03 kg/m   Intake/Output Summary (Last 24 hours) at 03/08/2024 1250 Last data filed at 03/08/2024 1049 Gross per 24 hour  Intake 1230 ml  Output 925 ml  Net 305 ml   Weight change:   Physical Exam: Gen: Lying in bed in no distress CVS: Normal rate, no rub Resp: Bilateral chest rise with no increased work of  breathing Abd: +choley drain, moderately distended Ext: no edema Neuro: awake, alert  Imaging: No results found.   Labs: BMET Recent Labs  Lab 03/02/24 0252 03/03/24 0401 03/04/24 0244 03/05/24 0345 03/06/24 0300 03/07/24 0500 03/08/24 1100  NA 135 135 137 136 136 138 138  K 3.4* 3.6 4.0 4.0 3.4* 3.3* 4.0  CL 102 101 101 103 102 102 102  CO2 16* 18* 18* 14* 22 24 24   GLUCOSE 87 94 92 77 110* 105* 124*  BUN 74* 79* 88* 96* 70* 50* 61*  CREATININE 5.97* 6.90* 7.41* 7.94* 6.23* 4.97* 6.17*  CALCIUM  8.0* 8.4* 8.4* 7.9* 7.7* 7.7* 7.6*  PHOS  --  3.3  --  5.7* 4.1 3.4 5.6*   CBC Recent Labs  Lab 03/05/24 0344 03/06/24 0300 03/07/24 0500 03/08/24 1058  WBC 9.0 6.5 8.1 10.9*  NEUTROABS  --   --  6.3  --   HGB 12.7* 11.9* 11.0* 6.2*  HCT 38.3* 35.2* 32.3* 18.4*  MCV 88.9 85.2 85.9 86.8  PLT 100* 94* 88* 86*    Medications:     Chlorhexidine  Gluconate Cloth  6 each Topical Q0600   [START ON 03/09/2024] lidocaine   1 patch Transdermal Q24H   lidocaine   1 patch Transdermal STAT   sodium chloride  flush  5 mL Intracatheter Q8H      "

## 2024-03-08 NOTE — Progress Notes (Signed)
 Lab called with a critical hgb of 6.2. RN paged MD. Blood transfusion has been ordered. Type and screen process has been ordered.

## 2024-03-08 NOTE — Progress Notes (Addendum)
 "   Referring Physician(s): Sebastian Moles  Supervising Physician: Hughes Simmonds  Patient Status:  [name]  Chief Complaint: bleeding around perc chole drain site  Subjective: Patient lying in bed. Reports he was dizzy this morning, but feels a little better after receiving a unit of blood. Denies blood in urine.   RN reports gauze dressing around site was saturated with blood this morning and she changed the dressing then, but she has not had to change the dressing since then. RN starting second unit of blood and preparing to bladder scan patient as he has not voided since this morning.   Allergies: Advil [ibuprofen] and Choline fenofibrate  Medications: Prior to Admission medications  Medication Sig Start Date End Date Taking? Authorizing Provider  atorvastatin  (LIPITOR) 40 MG tablet TAKE 1 TABLET BY MOUTH EVERY DAY 11/08/23  Yes Kennyth Worth HERO, MD  losartan  (COZAAR ) 50 MG tablet Take 1 tablet (50 mg total) by mouth daily. 02/02/24  Yes Kennyth Worth HERO, MD  metoprolol  succinate (TOPROL -XL) 25 MG 24 hr tablet Take 1 tablet (25 mg total) by mouth daily. Patient taking differently: Take 12.5 mg by mouth daily. 02/02/24  Yes Kennyth Worth HERO, MD  tadalafil  (CIALIS ) 20 MG tablet Take 0.5-1 tablets (10-20 mg total) by mouth daily as needed for erectile dysfunction. 11/09/23   Kennyth Worth HERO, MD     Vital Signs: BP (!) 99/52 (BP Location: Right Arm)   Pulse 90   Temp 97.6 F (36.4 C) (Oral)   Resp 20   Ht 6' 1 (1.854 m)   Wt 197 lb 5 oz (89.5 kg)   SpO2 97%   BMI 26.03 kg/m   Physical Exam Cardiovascular:     Rate and Rhythm: Normal rate.  Pulmonary:     Effort: Pulmonary effort is normal. No respiratory distress.  Abdominal:     Palpations: Abdomen is soft.     Tenderness: There is no abdominal tenderness.     Comments: Non-tender to light palpation  Skin:    General: Skin is warm.     Comments: Perc chole drain- Bilious output in gravity bag Gauze dressing with  minimal bloody drainage at this time Suture intact  Neurological:     Mental Status: He is alert and oriented to person, place, and time.  Psychiatric:        Mood and Affect: Mood normal.        Behavior: Behavior normal.     Imaging: IR NON-TUNNELED CENTRAL VENOUS CATH [facility] W IMG Result Date: 03/05/2024 EXAM: Central venous catheter placement (non-tunneled) COMPARISON: none CLINICAL HISTORY: renal failure, needs access for dialysis. Complications: No immediate complications. Conscious Sedation: none Radiation dose reduction techniques were utilized as appropriate. Radiation exposure index (as provided by the fluoroscopic device): 1.6 mGy air kerma. Discussion: Time out performed including verification of patient, date of birth, procedure, and access site as appropriate. Informed written consent was obtained. The access site was prepared and draped using maximal sterile barrier technique including cutaneous antisepsis. The patient was positioned supine. Initial imaging was performed. Venous access: Access site: internal jugular Laterality: Right Indication for catheter placement: Dialysis Local anesthesia was administered. Under ultrasound guidance, venous access was obtained. A guidewire was advanced and a non-tunneled central venous catheter was placed using standard Seldinger technique. Catheter details: Catheter type: Trialysis Catheter length: 16cm Catheter tip position confirmed with imaging. The catheter was secured and a sterile dressing was applied. All lumens aspirated and flushed appropriately. Post-procedure imaging: Immediate post-placement  imaging: Fluoroscopy Post-procedure imaging findings: tip proximal RA Radiation exposure index (as provided by the fluoroscopic device): 1.6 mgy air kerma Contrast: Contrast agent: None. Contrast volume: 0 mL. Estimated blood loss: Less than 10 mL. IMPRESSION: 1. Technically successful placement of a non-tunneled central venous dialysis catheter. 2. No  immediate complications. Electronically signed by: Katheleen Faes MD 03/05/2024 09:46 AM EST RP Workstation: [id number]    Labs:  CBC: Recent Labs    03/05/24 0344 03/06/24 0300 03/07/24 0500 03/08/24 1058 03/08/24 1312  WBC 9.0 6.5 8.1 10.9*  --   HGB 12.7* 11.9* 11.0* 6.2* 5.5*  HCT 38.3* 35.2* 32.3* 18.4* 16.8*  PLT 100* 94* 88* 86*  --     COAGS: Recent Labs    03/02/24 1338  INR 1.0    BMP: Recent Labs    03/05/24 0345 03/06/24 0300 03/07/24 0500 03/08/24 1100  NA 136 136 138 138  K 4.0 3.4* 3.3* 4.0  CL 103 102 102 102  CO2 14* 22 24 24   GLUCOSE 77 110* 105* 124*  BUN 96* 70* 50* 61*  CALCIUM  7.9* 7.7* 7.7* 7.6*  CREATININE 7.94* 6.23* 4.97* 6.17*  GFRNONAA 6* 9* 11* 9*    LIVER FUNCTION TESTS: Recent Labs    03/04/24 0244 03/05/24 0344 03/05/24 0345 03/06/24 0300 03/07/24 0500 03/08/24 1058 03/08/24 1100  BILITOT 1.7* 1.3*  --  1.3*  --  0.9  --   AST 31 25  --  22  --  43*  --   ALT 27 20  --  16  --  24  --   ALKPHOS 108 87  --  78  --  54  --   PROT 6.1* 5.8*  --  5.7*  --  5.0*  --   ALBUMIN 2.7* 2.5*   < > 2.6*  2.6* 2.7* 2.6* 2.6*   < > = values in this interval not displayed.    Assessment and Plan: Acute cholecystitis, bleeding around insertion site of perc chole drain placed on 12/21-  Perc chole drain- Bilious output in gravity bag Gauze dressing with minimal bloody drainage at this time Suture intact  Low suspicion drop in hemoglobin is related to perc chole drain. Dr. Hughes recommended CT angio abdomen pelvis which has been ordered. Has received 1 unit of PRBC, RN at bedside starting second unit PRBC   Electronically Signed: Yasseen Salls B Adalis Gatti, NP 03/08/2024, 4:21 PM   I spent a total of 15 Minutes at the the patient's bedside AND on the patient's hospital floor or unit, greater than 50% of which was counseling/coordinating care for bleeding at perc chole drain site.  "

## 2024-03-08 NOTE — Progress Notes (Signed)
 "   Rounding Note    Patient Name: [name] Date of Encounter: 03/08/2024  [facility] Cardiologist: Shelda Bruckner, MD  Chief Complaint: Nausea, vomiting, diarrhea  Reason of consult: NSVT  Subjective   Patient seen and examined at bedside. Denies anginal chest pain or heart failure symptoms. States that he feels tired and fatigued and not at baseline  Inpatient Medications    Scheduled Meds:  sodium chloride    Intravenous Once   Chlorhexidine  Gluconate Cloth  6 each Topical Q0600   [START ON 03/09/2024] lidocaine   1 patch Transdermal Q24H   lidocaine   1 patch Transdermal STAT   sodium chloride  flush  5 mL Intracatheter Q8H   Continuous Infusions:  lactated ringers  250 mL/hr at 03/08/24 1049   lactated ringers      piperacillin -tazobactam (ZOSYN )  IV 3.375 g (03/08/24 1020)   PRN Meds: acetaminophen  **OR** acetaminophen , albuterol , HYDROmorphone  (DILAUDID ) injection, iohexol , loperamide , ondansetron  (ZOFRAN ) IV, oxyCODONE    Vital Signs    Vitals:   03/08/24 1055 03/08/24 1111 03/08/24 1115 03/08/24 1130  BP: (!) 79/56 (!) 75/53 (!) 71/47 (!) 76/57  Pulse:      Resp:      Temp:      TempSrc:      SpO2:      Weight:      Height:        Intake/Output Summary (Last 24 hours) at 03/08/2024 1203 Last data filed at 03/08/2024 1049 Gross per 24 hour  Intake 1230 ml  Output 1125 ml  Net 105 ml      03/06/2024   11:28 AM 03/06/2024    8:36 AM 03/01/2024    9:09 PM  Last 3 Weights  Weight (lbs) 197 lb 5 oz 196 lb 10.4 oz 191 lb 1.6 oz  Weight (kg) 89.5 kg 89.2 kg 86.682 kg      Telemetry    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)- Personally Reviewed  ECG    No new tracings- Personally Reviewed  Physical Exam   General: Age-appropriate, hemodynamically stable, no acute distress, appears pale HEENT: Normocephalic, atraumatic, trachea midline and no JVP, triple-lumen right IJ Lungs: Clear to auscultation bilaterally no wheezes  rales or rhonchi's Heart: Regular rate and rhythm, positive S1-S2 Abdomen: Soft, distended, nontender, drain present Extremities: Warm to touch, no lower extremity swelling Neuro: Moves all 4 extremities, nonfocal Psych: Cooperative, pleasant  Labs    High Sensitivity Troponin:  No results for input(s): TROPONINIHS in the last 720 hours.   Chemistry Recent Labs  Lab 03/02/24 0252 03/03/24 0401 03/03/24 1324 03/04/24 0244 03/05/24 0344 03/05/24 0345 03/06/24 0300 03/07/24 0500  NA 135   < >  --  137  --  136 136 138  K 3.4*   < >  --  4.0  --  4.0 3.4* 3.3*  CL 102   < >  --  101  --  103 102 102  CO2 16*   < >  --  18*  --  14* 22 24  GLUCOSE 87   < >  --  92  --  77 110* 105*  BUN 74*   < >  --  88*  --  96* 70* 50*  CREATININE 5.97*   < >  --  7.41*  --  7.94* 6.23* 4.97*  CALCIUM  8.0*   < >  --  8.4*  --  7.9* 7.7* 7.7*  MG 2.2  --  2.0  --   --   --   --  1.8  PROT 5.9*   < >  --  6.1* 5.8*  --  5.7*  --   ALBUMIN 2.6*   < >  --  2.7* 2.5* 2.5* 2.6*  2.6* 2.7*  AST 61*   < >  --  31 25  --  22  --   ALT 39   < >  --  27 20  --  16  --   ALKPHOS 142*   < >  --  108 87  --  78  --   BILITOT 2.1*   < >  --  1.7* 1.3*  --  1.3*  --   GFRNONAA 9*   < >  --  7*  --  6* 9* 11*  ANIONGAP 17*   < >  --  18*  --  19* 13 12   < > = values in this interval not displayed.    Lipids No results for input(s): CHOL, TRIG, HDL, LABVLDL, LDLCALC, CHOLHDL in the last 168 hours.  Hematology Recent Labs  Lab 03/06/24 0300 03/07/24 0500 03/08/24 1058  WBC 6.5 8.1 10.9*  RBC 4.13* 3.76* 2.12*  HGB 11.9* 11.0* 6.2*  HCT 35.2* 32.3* 18.4*  MCV 85.2 85.9 86.8  MCH 28.8 29.3 29.2  MCHC 33.8 34.1 33.7  RDW 15.3 14.7 14.4  PLT 94* 88* 86*   Thyroid   Recent Labs  Lab 03/03/24 1324  TSH 3.710    BNP Recent Labs  Lab 03/04/24 1129  PROBNP 18,836.0*    DDimer No results for input(s): DDIMER in the last 168 hours.   Radiology    N/A  Cardiac Studies    Echocardiogram: 03/04/2024: Pending  Patient Profile     78 y.o. male with a hx of hypertension, hyperlipidemia, PVCs who is being seen for the evaluation of nonsustained ventricular tachycardia at the request of Al-Sultani, Anmar, MD.   Assessment & Plan    Impression: Premature ventricular contractions. Hypotensive Sepsis secondary to gastroenteritis/cholecystitis. Acute kidney injury. Thrombocytopenia. Hypertension. Hyperlipidemia  Recommendations: Cardiology consulted 03/03/2024 due to increased PVC burden and concerns for NSVT. Clinically he is asymptomatic and was hemodynamically stable. Was restarted on his home dose of Toprol -XL. Echocardiogram this admission notes preserved LVEF, no regional wall motion abnormalities, and no significant valvular heart disease.  Shared decision was to treat as acute illnesses and to follow-up up outpatient for PVC management. But I have been following his telemetry peripherally. Cardiology rounder yesterday recommended starting amiodarone for better PVC control.  Amiodarone still has not been started. Since the recommendations, in the last 24 hours patient is hypotensive, rising lactic acid, AKI continues, thrombocytopenic, and on physical examination appears pale. Given his acute cholecystitis status post drain placement would hold off on starting amiodarone.  If AST ALT were to rise would be difficult to ascertain if it secondary to recent amiodarone start versus ongoing sepsis/infection. On telemetry he remains in sinus rhythm with PVCs but no sustained arrhythmia.  Given the hypotension, lactic acid, and appearing pale I did reach out to attending physician to escalate care and also spoke to nursing staff.  Labs have been ordered which are pending.  Outpatient would recommend repeat cardiac monitor to evaluate PVC burden and consider EP evaluation based on results.  For questions or updates, please contact [facility] Please consult [email or web address] for contact info under     Signed, Madonna Large, DO, [facility]  A Division of [facility] [phone]  [address] [place], [place] [address]    "

## 2024-03-08 NOTE — TOC Progression Note (Signed)
 Transition of Care [facility]) - Progression Note    Patient Details  Name: [name] MRN: [id number] Date of Birth: 1945-08-31  Transition of [facility]) CM/SW Contact  Waddell Barnie Rama, RN Phone Number: 03/08/2024, 4:17 PM  Clinical Narrative:    Per progression , he is getting boluses for low bp, and his lactate is elevated.  ICM will continue to follow.                     Expected Discharge Plan and Services                                               Social Drivers of Health (SDOH) Interventions SDOH Screenings   Food Insecurity: No Food Insecurity (03/01/2024)  Housing: Low Risk (03/01/2024)  Transportation Needs: No Transportation Needs (03/01/2024)  Utilities: Not At Risk (03/01/2024)  Depression (PHQ2-9): Low Risk (12/26/2023)  Financial Resource Strain: Low Risk (12/22/2023)  Physical Activity: Insufficiently Active (12/22/2023)  Social Connections: Socially Integrated (03/01/2024)  Stress: No Stress Concern Present (12/22/2023)  Tobacco Use: Medium Risk (03/02/2024)    Readmission Risk Interventions    03/04/2024    3:15 PM  Readmission Risk Prevention Plan  Medication Screening Complete  Transportation Screening Complete

## 2024-03-08 NOTE — Progress Notes (Signed)
 Repeat H&H lab drawn via CN, new critical lab  value of 5.5. MD updated with new lab value and nephrology at bedside, made aware. Rapid Response RN made aware of patient condition.

## 2024-03-08 NOTE — Care Management Important Message (Signed)
 Important Message  Patient Details  Name: [name] MRN: [id number] Date of Birth: 08/21/1945   Important Message Given:  Yes - Medicare IM     Claretta Deed 03/08/2024, 3:03 PM

## 2024-03-09 DIAGNOSIS — N179 Acute kidney failure, unspecified: Secondary | ICD-10-CM | POA: Diagnosis not present

## 2024-03-09 LAB — CBC
HCT: 19.9 % — ABNORMAL LOW (ref 39.0–52.0)
HCT: 21.5 % — ABNORMAL LOW (ref 39.0–52.0)
Hemoglobin: 6.9 g/dL — CL (ref 13.0–17.0)
Hemoglobin: 7.4 g/dL — ABNORMAL LOW (ref 13.0–17.0)
MCH: 29.9 pg (ref 26.0–34.0)
MCH: 29.4 pg (ref 26.0–34.0)
MCHC: 34.7 g/dL (ref 30.0–36.0)
MCHC: 34.4 g/dL (ref 30.0–36.0)
MCV: 86.1 fL (ref 80.0–100.0)
MCV: 85.3 fL (ref 80.0–100.0)
Platelets: 82 K/uL — ABNORMAL LOW (ref 150–400)
Platelets: 86 K/uL — ABNORMAL LOW (ref 150–400)
RBC: 2.31 MIL/uL — ABNORMAL LOW (ref 4.22–5.81)
RBC: 2.52 MIL/uL — ABNORMAL LOW (ref 4.22–5.81)
RDW: 14.7 % (ref 11.5–15.5)
RDW: 14.1 % (ref 11.5–15.5)
WBC: 9.8 K/uL (ref 4.0–10.5)
WBC: 11.7 K/uL — ABNORMAL HIGH (ref 4.0–10.5)
nRBC: 0 % (ref 0.0–0.2)
nRBC: 0 % (ref 0.0–0.2)

## 2024-03-09 LAB — HEPATIC FUNCTION PANEL
ALT: 22 U/L (ref 0–44)
AST: 40 U/L (ref 15–41)
Albumin: 2.6 g/dL — ABNORMAL LOW (ref 3.5–5.0)
Alkaline Phosphatase: 56 U/L (ref 38–126)
Bilirubin, Direct: 0.6 mg/dL — ABNORMAL HIGH (ref 0.0–0.2)
Indirect Bilirubin: 0.4 mg/dL (ref 0.3–0.9)
Total Bilirubin: 1 mg/dL (ref 0.0–1.2)
Total Protein: 5.2 g/dL — ABNORMAL LOW (ref 6.5–8.1)

## 2024-03-09 LAB — BASIC METABOLIC PANEL WITH GFR
Anion gap: 12 (ref 5–15)
BUN: 66 mg/dL — ABNORMAL HIGH (ref 8–23)
CO2: 23 mmol/L (ref 22–32)
Calcium: 7.5 mg/dL — ABNORMAL LOW (ref 8.9–10.3)
Chloride: 102 mmol/L (ref 98–111)
Creatinine, Ser: 6.45 mg/dL — ABNORMAL HIGH (ref 0.61–1.24)
GFR, Estimated: 8 mL/min — ABNORMAL LOW
Glucose, Bld: 92 mg/dL (ref 70–99)
Potassium: 4 mmol/L (ref 3.5–5.1)
Sodium: 137 mmol/L (ref 135–145)

## 2024-03-09 LAB — LIPASE, BLOOD: Lipase: 100 U/L — ABNORMAL HIGH (ref 11–51)

## 2024-03-09 LAB — GLUCOSE, CAPILLARY
Glucose-Capillary: 95 mg/dL (ref 70–99)
Glucose-Capillary: 103 mg/dL — ABNORMAL HIGH (ref 70–99)

## 2024-03-09 LAB — PREPARE RBC (CROSSMATCH)

## 2024-03-09 MED ORDER — SODIUM CHLORIDE 0.9% IV SOLUTION: Freq: Once | INTRAVENOUS | Status: AC

## 2024-03-09 MED ORDER — HEPARIN SODIUM (PORCINE) 1000 UNIT/ML IJ SOLN
2600.0000 [IU] | Freq: Once | INTRAMUSCULAR | Status: AC
  Administered 2024-03-09: 2600 [IU]

## 2024-03-09 MED ORDER — LACTATED RINGERS IV SOLN: INTRAVENOUS | Status: AC

## 2024-03-09 MED ORDER — HEPARIN SODIUM (PORCINE) 1000 UNIT/ML IJ SOLN
INTRAMUSCULAR | Status: AC
  Filled 2024-03-09: qty 3

## 2024-03-09 NOTE — Progress Notes (Signed)
" °   03/09/24 1258  Vitals  Temp 97.8 F (36.6 C)  Pulse Rate 78  Resp (!) 22  BP (!) 97/56  SpO2 98 %  O2 Device Nasal Cannula  Weight (S)  90.1 kg (Bed Scale)  Type of Weight Post-Dialysis  Oxygen Therapy  O2 Flow Rate (L/min) 3 L/min  Patient Activity (if Appropriate) In bed  Pulse Oximetry Type Continuous  Post Treatment  Dialyzer Clearance Clear  Hemodialysis Intake (mL) 0 mL  Liters Processed 62.2  Fluid Removed (mL) -3 mL  Tolerated HD Treatment (S)  No (Comment) (Pt. decreasing in SBP periodically, did not remove any fluids.)  Post-Hemodialysis Comments Pt. received 1 unit of PRBC per order without difficulties. Report call to [facility] bedside RN.   Received patient in bed to unit.  Alert and oriented.  Informed consent signed and in chart.   TX duration: 3.5  Patient tolerated well.  Transported back to the room  Alert, without acute distress.  Hand-off given to patient's nurse.   Access used: Yes Access issues: No  Total UF removed: 0 Medication(s) given: See MAR Post HD VS: See Above Grid Post HD weight: 90.1 kg   Zebedee DELENA [facility] "

## 2024-03-09 NOTE — Plan of Care (Signed)
" °  Problem: Health Behavior/Discharge Planning: Goal: Ability to manage health-related needs will improve Outcome: Progressing   Problem: Clinical Measurements: Goal: Respiratory complications will improve Outcome: Progressing Goal: Cardiovascular complication will be avoided Outcome: Progressing   Problem: Elimination: Goal: Will not experience complications related to urinary retention Outcome: Progressing   Problem: Pain Managment: Goal: General experience of comfort will improve and/or be controlled Outcome: Progressing   Problem: Education: Goal: Knowledge of General Education information will improve Description: Including pain rating scale, medication(s)/side effects and non-pharmacologic comfort measures Outcome: Progressing   Problem: Clinical Measurements: Goal: Respiratory complications will improve Outcome: Progressing Goal: Cardiovascular complication will be avoided Outcome: Progressing   "

## 2024-03-09 NOTE — Progress Notes (Signed)
 " [facility] Progress Note    Assessment/ Plan:   AKI on CKD3a, oliguric -baseline Cr around 1.1-1.3 with AKI likely related to ATN - Has had some dehydration with attempted IV fluids but creatinine continued to rise despite hydration - No obvious uremic symptoms at this time; some nausea but may be related to the hypotension - He went dialysis on 12/23 and 12/24.  Some urine output some monitor closely for recovery.    Seen on dialysis tomorrow unless significant signs of renal recovery RIJ temp 2L net UF -> decrease to 1L -> net even BP 92/61 asymp Waiting for PRBC 3K bath  -> plan on holding hd hopefully next few days and see if UOP incr with signs of renal recovery  He is very hypotensive today with marked worsening of anemia and waiting for PRBC's which has already been ordered.   Acute cholecystitis -per primary service. Surgery following -s/p choley drain 12/21 with IR, bloody drainage   Sepsis -secondary to gastroenteritis +/- cholecystitis - Antibiotics per primary.  Now much improved   Left renal mass -recommend MRI with and without contrast as outpatient. If here longer than expected then can be done prior to discharge, will need to see urology if mass is suspicious for RCC   HTN -BP soft, holding  anti-HTNs   Thrombocytopenia -work up per primary    Subjective:   Patient feels fatigued with intermittent nausea.  Noted bleeding around the drain site by RN.  Dizziness and SOB resolved when he is supine back in bed. UOP 450/24hr       Objective:   BP (!) 99/52   Pulse 94   Temp 98.1 F (36.7 C)   Resp (!) 22   Ht 6' 1 (1.854 m)   Wt 90.2 kg Comment: Taken via bed.  SpO2 96%   BMI 26.24 kg/m   Intake/Output Summary (Last 24 hours) at 03/09/2024 9082 Last data filed at 03/09/2024 9380 Gross per 24 hour  Intake 3248.5 ml  Output 1150 ml  Net 2098.5 ml   Weight change:   Physical Exam: Gen: Lying in bed in no distress CVS: Normal  rate, no rub Resp: Bilateral chest rise with no increased work of breathing Abd: +choley drain, moderately distended Ext: no edema Neuro: awake, alert  Imaging: CT Angio Abd/Pel w/ and/or w/o Result Date: 03/08/2024 EXAM: CTA ABDOMEN AND PELVIS WITH AND WITHOUT CONTRAST 03/08/2024 07:09:03 PM TECHNIQUE: CTA images of the abdomen and pelvis without and with intravenous contrast. Three-dimensional MIP/volume rendered formations were performed. Automated exposure control, iterative reconstruction, and/or weight based adjustment of the mA/kV was utilized to reduce the radiation dose to as low as reasonably achievable. COMPARISON: 03/01/2024 CLINICAL HISTORY: Retroperitoneal bleed suspected. FINDINGS: VASCULATURE: AORTA: Extensive aortoiliac atherosclerotic calcification. No acute finding. No abdominal aortic aneurysm. No dissection. CELIAC TRUNK: No acute finding. No occlusion or significant stenosis. SUPERIOR MESENTERIC ARTERY: No acute finding. No occlusion or significant stenosis. RENAL ARTERIES: No acute finding. No occlusion or significant stenosis. ILIAC ARTERIES: Extensive aortoiliac atherosclerotic calcification. No acute finding. No occlusion or significant stenosis. LIVER: Interval transhepatic percutaneous cholecystostomy has been performed with decompression of the gallbladder. The retaining loop of the drainage catheter appears well positioned within the decompressed gallbladder lumen. There has developed with a sentinel clot seen within the right posterior perihepatic region extending into the subdiaphragmatic region and inferiorly into Morison's pouch. There is, however, no active extravasation arising from the transhepatic puncture at this time. GALLBLADDER AND BILE DUCTS:  Interval transhepatic percutaneous cholecystostomy has been performed with decompression of the gallbladder. The retaining loop of the drainage catheter appears well positioned within the decompressed gallbladder lumen. No  biliary ductal dilatation. SPLEEN: The spleen is unremarkable. PANCREAS: Infiltration surrounding the tail of the pancreas, however, appears new and is nonspecific. This may represent infiltrative hemorrhage related to fluid within the lesser sac though superimposed acute interstitial / edematous pancreatitis could appear similarly. The pancreatic duct does not dilated. There is normal enhancement of the pancreatic parenchyma. 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 The left adrenal gland demonstrates no acute abnormality. KIDNEYS, URETERS AND BLADDER: Marked right renal cortical atrophy. Simple cortical cyst within the left kidney for which no follow up imaging is recommended. No stones in the kidneys or ureters. No hydronephrosis. No perinephric or periureteral stranding. The bladder is partially obscured by streak artifact, however, the visualized portion is unremarkable. GI AND BOWEL: Hi attenuation fluid collection adjacent to the greater curvature of the stomach represents hemoperitoneum within the lesser sac. Severe descending and sigmoid colonic diverticulosis without superimposed acute inflammatory change. Stomach and duodenal sweep demonstrate no acute abnormality. There is no bowel obstruction. No abnormal bowel wall thickening or distension. REPRODUCTIVE: Reproductive organs are unremarkable. PERITONEUM AND RETROPERITONEUM: Hi attenuation fluid collection adjacent to the greater curvature of the stomach represents hemoperitoneum within the lesser sac. A sentinel clot seen within the right posterior perihepatic region extending into the subdiaphragmatic region and inferiorly into Morison's pouch. No free air. LUNG BASE: Small bilateral pleural effusions are now seen at the lung bases with associated bibasilar compressive atelectasis. LYMPH NODES: No lymphadenopathy. BONES AND SOFT TISSUES: Status post right total hip  arthroplasty. Osseous structures are age appropriate. No acute bone abnormality. No lytic or blastic bone lesion. No acute soft tissue abnormality. HEART AND MEDIASTINUM: New hypoattenuation of the cardiac blood pool in keeping with at least moderate anemia. Extensive coronary artery calcification. Calcification of the aortic valve leaflets. Mild cardiomegaly. IMPRESSION: 1. No active extravasation arising from the transhepatic puncture at this time. No active gastrointestinal hemorrhage. 2. Hemoperitoneum, including sentinel clot within the right posterior perihepatic region extending into the subdiaphragmatic region and Morison pouch, and hemoperitoneum within the lesser sac. Given the interval cholecystostomy, this is favored to represent intraperitoneal hemorrhage related to the transhepatic puncture. 3. Interval transhepatic percutaneous cholecystostomy with decompression of the gallbladder and drainage catheter appropriately positioned. 4. New hypoattenuation of the cardiac blood pool, consistent with at least moderate anemia. 5. Nonspecific infiltration surrounding the tail of the pancreas, possibly representing infiltrative hemorrhage or superimposed acute interstitial/edematous pancreatitis. Correlation with serum amylase and lipase levels would be helpful for confirmation. 6. Small bilateral pleural effusions with associated bibasilar compressive atelectasis. 7. Extensive coronary artery calcification. 8. Mild cardiomegaly. 9. Marked right renal cortical atrophy. 10. Severe descending and sigmoid colonic diverticulosis without acute inflammatory change. 11. Status post right total hip arthroplasty. Electronically signed by: Dorethia Molt MD 03/08/2024 07:59 PM EST RP Workstation: [id number]     Labs: BMET Recent Labs  Lab 03/03/24 0401 03/04/24 0244 03/05/24 0345 03/06/24 0300 03/07/24 0500 03/08/24 1100 03/09/24 0452  NA 135 137 136 136 138 138 137  K 3.6 4.0 4.0 3.4* 3.3* 4.0 4.0  CL 101  101 103 102 102 102 102  CO2 18* 18* 14* 22 24 24 23   GLUCOSE 94 92 77 110* 105* 124* 92  BUN 79* 88* 96* 70* 50*  61* 66*  CREATININE 6.90* 7.41* 7.94* 6.23* 4.97* 6.17* 6.45*  CALCIUM  8.4* 8.4* 7.9* 7.7* 7.7* 7.6* 7.5*  PHOS 3.3  --  5.7* 4.1 3.4 5.6*  --    CBC Recent Labs  Lab 03/06/24 0300 03/07/24 0500 03/08/24 1058 03/08/24 1312 03/08/24 2030 03/09/24 0452  WBC 6.5 8.1 10.9*  --   --  9.8  NEUTROABS  --  6.3  --   --   --   --   HGB 11.9* 11.0* 6.2* 5.5* 7.2* 6.9*  HCT 35.2* 32.3* 18.4* 16.8* 21.3* 19.9*  MCV 85.2 85.9 86.8  --   --  86.1  PLT 94* 88* 86*  --   --  82*    Medications:     sodium chloride    Intravenous Once   Chlorhexidine  Gluconate Cloth  6 each Topical Q0600   lidocaine   1 patch Transdermal Q24H   sodium chloride  flush  5 mL Intracatheter Q8H      "

## 2024-03-09 NOTE — Progress Notes (Signed)
 Pt will be receiving 1 unit of blood in dialysis today.

## 2024-03-09 NOTE — Progress Notes (Signed)
 "   Referring Physician(s): Sebastian Moles  Supervising Physician: Hughes Simmonds  Patient Status:  [facility] - In-pt  Chief Complaint: bleeding around perc chole drain site  Subjective: CTA Abdomen overnight shows hemoperitoneum likely stemming from the liver capsule related to perc chole placement.  Patient assessed in HD this AM.  Hypotensive with BP 83/58 during visit.  His perc chole bag is visible with thin, bilious drainage.  States he feels weak but overall ok.  Reports he ate breakfast this AM with mild nausea.  Denies acute belly pain, but with general distention and tightness  Allergies: Advil [ibuprofen] and Choline fenofibrate  Medications: Prior to Admission medications  Medication Sig Start Date End Date Taking? Authorizing Provider  atorvastatin  (LIPITOR) 40 MG tablet TAKE 1 TABLET BY MOUTH EVERY DAY 11/08/23  Yes Kennyth Worth HERO, MD  losartan  (COZAAR ) 50 MG tablet Take 1 tablet (50 mg total) by mouth daily. 02/02/24  Yes Kennyth Worth HERO, MD  metoprolol  succinate (TOPROL -XL) 25 MG 24 hr tablet Take 1 tablet (25 mg total) by mouth daily. Patient taking differently: Take 12.5 mg by mouth daily. 02/02/24  Yes Kennyth Worth HERO, MD  tadalafil  (CIALIS ) 20 MG tablet Take 0.5-1 tablets (10-20 mg total) by mouth daily as needed for erectile dysfunction. 11/09/23   Kennyth Worth HERO, MD     Vital Signs: BP 95/64   Pulse 94   Temp 98.7 F (37.1 C) (Oral)   Resp (!) 26   Ht 6' 1 (1.854 m)   Wt 198 lb 13.7 oz (90.2 kg) Comment: Taken via bed.  SpO2 97%   BMI 26.24 kg/m   Physical Exam Cardiovascular:     Rate and Rhythm: Normal rate.  Pulmonary:     Effort: Pulmonary effort is normal. No respiratory distress.  Abdominal:     Palpations: Abdomen is soft.     Tenderness: There is no abdominal tenderness.     Comments: Abdomen taut and distended without tenderness. Perc chole drain I place.  Combination of old bloody drainage with small amount of fresh blood on dressing at  insertion site. Bilious output in gravity bag.  Suture remains intact.   Skin:    General: Skin is warm.  Neurological:     Mental Status: He is alert and oriented to person, place, and time.  Psychiatric:        Mood and Affect: Mood normal.        Behavior: Behavior normal.     Imaging: CT Angio Abd/Pel w/ and/or w/o Result Date: 03/08/2024 EXAM: CTA ABDOMEN AND PELVIS WITH AND WITHOUT CONTRAST 03/08/2024 07:09:03 PM TECHNIQUE: CTA images of the abdomen and pelvis without and with intravenous contrast. Three-dimensional MIP/volume rendered formations were performed. Automated exposure control, iterative reconstruction, and/or weight based adjustment of the mA/kV was utilized to reduce the radiation dose to as low as reasonably achievable. COMPARISON: 03/01/2024 CLINICAL HISTORY: Retroperitoneal bleed suspected. FINDINGS: VASCULATURE: AORTA: Extensive aortoiliac atherosclerotic calcification. No acute finding. No abdominal aortic aneurysm. No dissection. CELIAC TRUNK: No acute finding. No occlusion or significant stenosis. SUPERIOR MESENTERIC ARTERY: No acute finding. No occlusion or significant stenosis. RENAL ARTERIES: No acute finding. No occlusion or significant stenosis. ILIAC ARTERIES: Extensive aortoiliac atherosclerotic calcification. No acute finding. No occlusion or significant stenosis. LIVER: Interval transhepatic percutaneous cholecystostomy has been performed with decompression of the gallbladder. The retaining loop of the drainage catheter appears well positioned within the decompressed gallbladder lumen. There has developed with a sentinel clot seen within the right posterior  perihepatic region extending into the subdiaphragmatic region and inferiorly into Morison's pouch. There is, however, no active extravasation arising from the transhepatic puncture at this time. GALLBLADDER AND BILE DUCTS: Interval transhepatic percutaneous cholecystostomy has been performed with decompression of  the gallbladder. The retaining loop of the drainage catheter appears well positioned within the decompressed gallbladder lumen. No biliary ductal dilatation. SPLEEN: The spleen is unremarkable. PANCREAS: Infiltration surrounding the tail of the pancreas, however, appears new and is nonspecific. This may represent infiltrative hemorrhage related to fluid within the lesser sac though superimposed acute interstitial / edematous pancreatitis could appear similarly. The pancreatic duct does not dilated. There is normal enhancement of the pancreatic parenchyma. 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 The left adrenal gland demonstrates no acute abnormality. KIDNEYS, URETERS AND BLADDER: Marked right renal cortical atrophy. Simple cortical cyst within the left kidney for which no follow up imaging is recommended. No stones in the kidneys or ureters. No hydronephrosis. No perinephric or periureteral stranding. The bladder is partially obscured by streak artifact, however, the visualized portion is unremarkable. GI AND BOWEL: Hi attenuation fluid collection adjacent to the greater curvature of the stomach represents hemoperitoneum within the lesser sac. Severe descending and sigmoid colonic diverticulosis without superimposed acute inflammatory change. Stomach and duodenal sweep demonstrate no acute abnormality. There is no bowel obstruction. No abnormal bowel wall thickening or distension. REPRODUCTIVE: Reproductive organs are unremarkable. PERITONEUM AND RETROPERITONEUM: Hi attenuation fluid collection adjacent to the greater curvature of the stomach represents hemoperitoneum within the lesser sac. A sentinel clot seen within the right posterior perihepatic region extending into the subdiaphragmatic region and inferiorly into Morison's pouch. No free air. LUNG BASE: Small bilateral pleural effusions are now seen at the lung bases with  associated bibasilar compressive atelectasis. LYMPH NODES: No lymphadenopathy. BONES AND SOFT TISSUES: Status post right total hip arthroplasty. Osseous structures are age appropriate. No acute bone abnormality. No lytic or blastic bone lesion. No acute soft tissue abnormality. HEART AND MEDIASTINUM: New hypoattenuation of the cardiac blood pool in keeping with at least moderate anemia. Extensive coronary artery calcification. Calcification of the aortic valve leaflets. Mild cardiomegaly. IMPRESSION: 1. No active extravasation arising from the transhepatic puncture at this time. No active gastrointestinal hemorrhage. 2. Hemoperitoneum, including sentinel clot within the right posterior perihepatic region extending into the subdiaphragmatic region and Morison pouch, and hemoperitoneum within the lesser sac. Given the interval cholecystostomy, this is favored to represent intraperitoneal hemorrhage related to the transhepatic puncture. 3. Interval transhepatic percutaneous cholecystostomy with decompression of the gallbladder and drainage catheter appropriately positioned. 4. New hypoattenuation of the cardiac blood pool, consistent with at least moderate anemia. 5. Nonspecific infiltration surrounding the tail of the pancreas, possibly representing infiltrative hemorrhage or superimposed acute interstitial/edematous pancreatitis. Correlation with serum amylase and lipase levels would be helpful for confirmation. 6. Small bilateral pleural effusions with associated bibasilar compressive atelectasis. 7. Extensive coronary artery calcification. 8. Mild cardiomegaly. 9. Marked right renal cortical atrophy. 10. Severe descending and sigmoid colonic diverticulosis without acute inflammatory change. 11. Status post right total hip arthroplasty. Electronically signed by: Dorethia Molt MD 03/08/2024 07:59 PM EST RP Workstation: [id number]    Labs:  CBC: Recent Labs    03/06/24 0300 03/07/24 0500 03/08/24 1058  03/08/24 1312 03/08/24 2030 03/09/24 0452  WBC 6.5 8.1 10.9*  --   --  9.8  HGB 11.9* 11.0* 6.2* 5.5* 7.2* 6.9*  HCT 35.2*  32.3* 18.4* 16.8* 21.3* 19.9*  PLT 94* 88* 86*  --   --  82*    COAGS: Recent Labs    03/02/24 1338  INR 1.0    BMP: Recent Labs    03/06/24 0300 03/07/24 0500 03/08/24 1100 03/09/24 0452  NA 136 138 138 137  K 3.4* 3.3* 4.0 4.0  CL 102 102 102 102  CO2 22 24 24 23   GLUCOSE 110* 105* 124* 92  BUN 70* 50* 61* 66*  CALCIUM  7.7* 7.7* 7.6* 7.5*  CREATININE 6.23* 4.97* 6.17* 6.45*  GFRNONAA 9* 11* 9* 8*    LIVER FUNCTION TESTS: Recent Labs    03/05/24 0344 03/05/24 0345 03/06/24 0300 03/07/24 0500 03/08/24 1058 03/08/24 1100 03/09/24 0452  BILITOT 1.3*  --  1.3*  --  0.9  --  1.0  AST 25  --  22  --  43*  --  40  ALT 20  --  16  --  24  --  22  ALKPHOS 87  --  78  --  54  --  56  PROT 5.8*  --  5.7*  --  5.0*  --  5.2*  ALBUMIN 2.5*   < > 2.6*  2.6* 2.7* 2.6* 2.6* 2.6*   < > = values in this interval not displayed.    Assessment and Plan: Acute cholecystitis s/p percutaneous cholecystomy tube placement 03/03/25 by Dr. Vanice. After several days of stability with adequate bilious drainage from his perc chole, patient suddenly developed fatigue, dizziness while standing, and hypotension.  Lab work reveal acute, severe anemia with nadir HgB 5.5 12/26 from 11.9 12/24.  ABLA suspected and IR consulted for re-evaluation of the perc chole tube which had begun to ooze from insertion site.  CTA Abdomen recommended by Dr. Hughes and obtained overnight showing hemoperitoneum, including sentinel clot within the right posterior perihepatic region extending into the subdiaphragmatic region and Morison pouch, and hemoperitoneum within the lesser sac.  Imaging reviewed by Dr. Jennefer.  Patient assessed at bedside this AM in HD.  Briefly discussed imaging findings with patient and reviewed current status with medical team. He continues to be hypotensive.   Additional unit of PBRC has been ordered by primary team.   IR following closely. Will monitor response to additional unit of blood currently ordered. Patient made NPO as a precaution.     Electronically Signed: Ryin Ambrosius Sue-Ellen Onofrio Klemp, PA 03/09/2024, 10:45 AM   I spent a total of 15 Minutes at the the patient's bedside AND on the patient's hospital floor or unit, greater than 50% of which was counseling/coordinating care for acute cholecystitis, hemoperitoneum.   "

## 2024-03-09 NOTE — Progress Notes (Signed)
 " PROGRESS NOTE    [name]  FMW:[id number] DOB: 1945/12/30 DOA: 03/01/2024 PCP: Kennyth Worth HERO, MD  Subjective: Underwent CTA A/P with concern for hemoperitoneum due to the recently placed biliary drain. Noted to have Hgb 6.9 and ordered 1 unit pRBC this morning. Seen and examined at bedside during dialysis. Reports still feeling weak and tired. Denies hemoptysis, hematemesis, epistaxis, BPBPR or melena, nausea, vomiting, constipaiton.   Hospital Course:  78 year old male with PMHx of HTN and HLD who presented to the ED on 03/01/2024 with several days of nausea, vomiting, and diarrhea. Symptoms began on 12/14 after returning from church and eating a frozen [employer] chicken meal, followed about an hour later by chills/subjective fevers with rigors, then profuse emesis and subsequent multiple episodes of loose watery stools. Emesis progressed from food contents to watery, nonbloody. Stools watery without blood or mucus. He reports poor p.o. intake since Sunday and generalized, nonfocal abdominal discomfort. Also notes markedly decreased urine output over the past 2 days with dark orange urine, without gross hematuria. In the ED he was found to have AKI and hypokalemia, with CT abdomen findings concerning for acute cholecystitis. He was admitted to the hospital service for further management. Now s/p perc chole tube. Has temp HD cath and is getting HD per renal. Dispo pending renal recovery at this point.    Assessment and Plan:  Hypotension Hemoperitoneum Acute blood loss anemia Acute thrombocytopenia History of HTN - low BP likely due to hemoperitoneum due to biliary drain placement, biliary output, and poor oral intake - Hgb 7.4 < 6.9, trough at 5.5, baseline 13-14  - platelets 86 < 82, baseline > 150 - Continue holding home Losartan  (also in setting of AKI) - hold Troprol-XL 25 mg daily - cont IV fluids - transfuse for Hgb goal > 7 - transfuse for platelet goal > 50 given  concerns of acute bleed - will check iron studies, folate, vitamin B12 - encourage oral intake - monitor clinically - IR team following   AKI on CKD stage IIIa  Oliguria High anion gap metabolic acidosis - Likely prerenal in the setting of decreased PO intake and increased GI losses with concomitant Losartan  use. Nephrology suspecting ATN given no obstruction.  - Cr 6.45 < 6.17, peaked to 7.9 12/23 - now s/p temp HD cath  - dialysis as per nephro recs - nephrology following   Acute hypoxic respiratory failure  - Currently on 2 < 4L , not on O2 at baseline with no known history of CHF. Currently net positive. Patient became tachypneic and hypoxic to 88% on RA likely secondary to volume overload on 12/22.  - Echo (12/22) showed LVEF 55-60% with no RWMA, mild concentric LVH, G1DD, normal RV function, no valvular abnormalities - CXR elevated R hemidiaphragm with R basilar opacity (atelectasis vs PNA) and L basilar atelectasis, rounded R paratracheal density (tortuous vessel vs adenopathy), recommended CT chest w/ contrast. No effusion or PTX.  - UF with HD sessions as per nephro recs - continue to wean oxygen as tolerated  - nephrology following   Sepsis Acute cholecystitis - Has RUQ pain on palpation with CT evidence of acute cholecystitis without CBD dilation - now s/p perc chole drain 12/21 - culture growing e. Coli  - Finished 5 days of IV Zosyn  post-biliary drain placement on 12/26 - may need to touch base with IR to evaluate the biliary drain if bloody output continues - Needs follow up in general surgery office in 6 weeks  to discuss interval cholecystectomy.  -  Needs follow up with IR to have cholangiogram before his appt.  Watch for high output. - General surgery following - IR team following   PVCs  NSVT - Tele alerted to multiple runs of VT on 12/21 during which patient was asymptomatic and HDS. - Patient with known history of high PVC burden. - Cardiology consulted on  12/21, determined NSVT with elevated burden of PVC worsened by ongoing acute illness, recommended echo and electrolyte replacement - Echo without concerning findings, as noted above - hold Toprol -XL 25 mg given hypotension as elsewhere  - hold amiodarone given hypotension and AKI on CKD - cardiology has signed off - last note 12/25 - will need to follow up with cardiology outpatient for non-invasive ischemic evaluation   Gastroenteritis - Patient presented with N/V and diarrhea which started after eating frozen meal - No further episodes of diarrhea, nausea, or vomiting.  Tolerating renal diet - GIPP and C diff PCR negative - Continue conservative management - Abx as elsewhere for acute cholecystitis - Advance diet as tolerated - PRN Zofran   - PRN imodium   Lactic acidosis - resolved - likely due to hemoperitoneum, hypovolemic/hypotension - IV fluids as elsewhere - finished antibiotics course on 12/26 - trend lactate   UTI ruled out - Urine culture showed no growth - Abx as elsewhere for acute cholecystitis   Hypokalemia  Resolved with supplementation Monitor and replete electrolytes as needed   Left renal mass - CT abd/pel showed soft tissue mass on left kidney - Will need MRI with contrast per renal mass protocol as outpatient   Abnormal CXR Right paratracheal density, tortuous vasculature vs adenopathy, needs CT with contrast when able - currently on hold with AKI   Transaminitis Direct Hyperbilirubinemia  - improving - Possibly related to acute illness in the setting of cholecystitis -  AST, ALT, and ALP normalized - T. Bili improving   HLD - Statin held due to transaminitis  DVT prophylaxis:   SCDs   Code Status: Full Code  Disposition Plan: TBD Reason for continuing need for hospitalization: severity of illness  Objective: Vitals:   03/09/24 1105 03/09/24 1138 03/09/24 1200 03/09/24 1258  BP: (!) 92/50 97/62 92/64  (!) 97/56  Pulse: 90 84 89 78  Resp:  (!) 23 19 (!) 22 (!) 22  Temp:  97.8 F (36.6 C)  97.8 F (36.6 C)  TempSrc:  Oral    SpO2: 96% 96% 97% 98%  Weight:    (S) 90.1 kg  Height:        Intake/Output Summary (Last 24 hours) at 03/09/2024 1410 Last data filed at 03/09/2024 1300 Gross per 24 hour  Intake 3240.5 ml  Output 1197 ml  Net 2043.5 ml   Filed Weights   03/06/24 1128 03/09/24 0823 03/09/24 1258  Weight: 89.5 kg 90.2 kg (S) 90.1 kg    Examination:  Physical Exam Vitals and nursing note reviewed.  Constitutional:      General: He is not in acute distress.    Appearance: He is ill-appearing.  HENT:     Head: Normocephalic and atraumatic.  Cardiovascular:     Rate and Rhythm: Normal rate and regular rhythm.     Pulses: Normal pulses.     Heart sounds: Normal heart sounds.  Pulmonary:     Effort: Pulmonary effort is normal.     Breath sounds: Normal breath sounds.  Abdominal:     General: Bowel sounds are normal. There is no distension.  Palpations: Abdomen is soft.     Tenderness: There is no abdominal tenderness.  Neurological:     Mental Status: He is alert.     Data Reviewed: I have personally reviewed following labs and imaging studies  CBC: Recent Labs  Lab 03/06/24 0300 03/07/24 0500 03/08/24 1058 03/08/24 1312 03/08/24 2030 03/09/24 0452 03/09/24 1319  WBC 6.5 8.1 10.9*  --   --  9.8 11.7*  NEUTROABS  --  6.3  --   --   --   --   --   HGB 11.9* 11.0* 6.2* 5.5* 7.2* 6.9* 7.4*  HCT 35.2* 32.3* 18.4* 16.8* 21.3* 19.9* 21.5*  MCV 85.2 85.9 86.8  --   --  86.1 85.3  PLT 94* 88* 86*  --   --  82* 86*   Basic Metabolic Panel: Recent Labs  Lab 03/03/24 0401 03/03/24 1324 03/04/24 0244 03/05/24 0345 03/06/24 0300 03/07/24 0500 03/08/24 1100 03/09/24 0452  NA 135  --    < > 136 136 138 138 137  K 3.6  --    < > 4.0 3.4* 3.3* 4.0 4.0  CL 101  --    < > 103 102 102 102 102  CO2 18*  --    < > 14* 22 24 24 23   GLUCOSE 94  --    < > 77 110* 105* 124* 92  BUN 79*  --    < >  96* 70* 50* 61* 66*  CREATININE 6.90*  --    < > 7.94* 6.23* 4.97* 6.17* 6.45*  CALCIUM  8.4*  --    < > 7.9* 7.7* 7.7* 7.6* 7.5*  MG  --  2.0  --   --   --  1.8  --   --   PHOS 3.3  --   --  5.7* 4.1 3.4 5.6*  --    < > = values in this interval not displayed.   GFR: Estimated Creatinine Clearance: 11 mL/min (A) (by C-G formula based on SCr of 6.45 mg/dL (H)). Liver Function Tests: Recent Labs  Lab 03/04/24 0244 03/05/24 0344 03/05/24 0345 03/06/24 0300 03/07/24 0500 03/08/24 1058 03/08/24 1100 03/09/24 0452  AST 31 25  --  22  --  43*  --  40  ALT 27 20  --  16  --  24  --  22  ALKPHOS 108 87  --  78  --  54  --  56  BILITOT 1.7* 1.3*  --  1.3*  --  0.9  --  1.0  PROT 6.1* 5.8*  --  5.7*  --  5.0*  --  5.2*  ALBUMIN 2.7* 2.5*   < > 2.6*  2.6* 2.7* 2.6* 2.6* 2.6*   < > = values in this interval not displayed.   Recent Labs  Lab 03/09/24 0452  LIPASE 100*   No results for input(s): AMMONIA in the last 168 hours. Coagulation Profile: No results for input(s): INR, PROTIME in the last 168 hours. Cardiac Enzymes: No results for input(s): CKTOTAL, CKMB, CKMBINDEX, TROPONINI in the last 168 hours. ProBNP, BNP (last 5 results) Recent Labs    03/04/24 1129  PROBNP 18,836.0*   HbA1C: No results for input(s): HGBA1C in the last 72 hours. CBG: Recent Labs  Lab 03/09/24 0622  GLUCAP 95   Lipid Profile: No results for input(s): CHOL, HDL, LDLCALC, TRIG, CHOLHDL, LDLDIRECT in the last 72 hours. Thyroid  Function Tests: No results for input(s): TSH, T4TOTAL, FREET4, T3FREE,  THYROIDAB in the last 72 hours. Anemia Panel: No results for input(s): VITAMINB12, FOLATE, FERRITIN, TIBC, IRON, RETICCTPCT in the last 72 hours. Sepsis Labs: Recent Labs  Lab 03/07/24 1733 03/07/24 1946 03/08/24 1058  LATICACIDVEN 3.8* 2.5* 1.3    Recent Results (from the past 240 hours)  Resp panel by RT-PCR (RSV, Flu A&B, Covid) Anterior Nasal  Swab     Status: None   Collection Time: 03/01/24  3:08 PM   Specimen: Anterior Nasal Swab  Result Value Ref Range Status   SARS Coronavirus 2 by RT PCR NEGATIVE NEGATIVE Final    Comment: (NOTE) SARS-CoV-2 target nucleic acids are NOT DETECTED.  The SARS-CoV-2 RNA is generally detectable in upper respiratory specimens during the acute phase of infection. The lowest concentration of SARS-CoV-2 viral copies this assay can detect is 138 copies/mL. A negative result does not preclude SARS-Cov-2 infection and should not be used as the sole basis for treatment or other patient management decisions. A negative result may occur with  improper specimen collection/handling, submission of specimen other than nasopharyngeal swab, presence of viral mutation(s) within the areas targeted by this assay, and inadequate number of viral copies(<138 copies/mL). A negative result must be combined with clinical observations, patient history, and epidemiological information. The expected result is Negative.  Fact Sheet for Patients:  bloggercourse.com  Fact Sheet for Healthcare Providers:  seriousbroker.it  This test is no t yet approved or cleared by the [place]  FDA and  has been authorized for detection and/or diagnosis of SARS-CoV-2 by FDA under an Emergency Use Authorization (EUA). This EUA will remain  in effect (meaning this test can be used) for the duration of the COVID-19 declaration under Section 564(b)(1) of the Act, 21 U.S.C.section 360bbb-3(b)(1), unless the authorization is terminated  or revoked sooner.       Influenza A by PCR NEGATIVE NEGATIVE Final   Influenza B by PCR NEGATIVE NEGATIVE Final    Comment: (NOTE) The Xpert Xpress SARS-CoV-2/FLU/RSV plus assay is intended as an aid in the diagnosis of influenza from Nasopharyngeal swab specimens and should not be used as a sole basis for treatment. Nasal washings and aspirates  are unacceptable for Xpert Xpress SARS-CoV-2/FLU/RSV testing.  Fact Sheet for Patients: bloggercourse.com  Fact Sheet for Healthcare Providers: seriousbroker.it  This test is not yet approved or cleared by the [place]  FDA and has been authorized for detection and/or diagnosis of SARS-CoV-2 by FDA under an Emergency Use Authorization (EUA). This EUA will remain in effect (meaning this test can be used) for the duration of the COVID-19 declaration under Section 564(b)(1) of the Act, 21 U.S.C. section 360bbb-3(b)(1), unless the authorization is terminated or revoked.     Resp Syncytial Virus by PCR NEGATIVE NEGATIVE Final    Comment: (NOTE) Fact Sheet for Patients: bloggercourse.com  Fact Sheet for Healthcare Providers: seriousbroker.it  This test is not yet approved or cleared by the [place]  FDA and has been authorized for detection and/or diagnosis of SARS-CoV-2 by FDA under an Emergency Use Authorization (EUA). This EUA will remain in effect (meaning this test can be used) for the duration of the COVID-19 declaration under Section 564(b)(1) of the Act, 21 U.S.C. section 360bbb-3(b)(1), unless the authorization is terminated or revoked.  Performed at [facility], [address] [place], [place] [address]   Blood culture (routine x 2)     Status: None   Collection Time: 03/01/24  5:25 PM   Specimen: BLOOD  Result Value Ref  Range Status   Specimen Description   Final    BLOOD RIGHT ANTECUBITAL Performed at [facility], [address] [place], [place] [address]    Special Requests   Final    BOTTLES DRAWN AEROBIC AND ANAEROBIC Blood Culture adequate volume Performed at [facility], [address] [place], [place] [address]    Culture   Final    NO GROWTH 5 DAYS Performed at [facility], [address],  [place], [place] [address]    Report Status 03/06/2024 FINAL  Final  Blood culture (routine x 2)     Status: None   Collection Time: 03/01/24  5:41 PM   Specimen: BLOOD RIGHT FOREARM  Result Value Ref Range Status   Specimen Description   Final    BLOOD RIGHT FOREARM Performed at [facility], [address] [place], [place] [address]    Special Requests   Final    BOTTLES DRAWN AEROBIC AND ANAEROBIC Blood Culture adequate volume Performed at [facility], [address] [place], [place] [address]    Culture   Final    NO GROWTH 5 DAYS Performed at [facility], [address] [place], [place] [address]    Report Status 03/06/2024 FINAL  Final  Gastrointestinal Panel by PCR , Stool     Status: None   Collection Time: 03/01/24 10:05 PM   Specimen: Stool  Result Value Ref Range Status   Campylobacter species NOT DETECTED NOT DETECTED Final   Plesimonas shigelloides NOT DETECTED NOT DETECTED Final   Salmonella species NOT DETECTED NOT DETECTED Final   Yersinia enterocolitica NOT DETECTED NOT DETECTED Final   Vibrio species NOT DETECTED NOT DETECTED Final   Vibrio cholerae NOT DETECTED NOT DETECTED Final   Enteroaggregative E coli (EAEC) NOT DETECTED NOT DETECTED Final   Enteropathogenic E coli (EPEC) NOT DETECTED NOT DETECTED Final   Enterotoxigenic E coli (ETEC) NOT DETECTED NOT DETECTED Final   Shiga like toxin producing E coli (STEC) NOT DETECTED NOT DETECTED Final   Shigella/Enteroinvasive E coli (EIEC) NOT DETECTED NOT DETECTED Final   Cryptosporidium NOT DETECTED NOT DETECTED Final   Cyclospora cayetanensis NOT DETECTED NOT DETECTED Final   Entamoeba histolytica NOT DETECTED NOT DETECTED Final   Giardia lamblia NOT DETECTED NOT DETECTED Final   Adenovirus F40/41 NOT DETECTED NOT DETECTED Final   Astrovirus NOT DETECTED NOT DETECTED Final   Norovirus GI/GII NOT DETECTED NOT DETECTED Final   Rotavirus A NOT DETECTED NOT DETECTED Final   Sapovirus  (I, II, IV, and V) NOT DETECTED NOT DETECTED Final    Comment: Performed at [facility], [address] [place], [place] [address]  C Difficile Quick Screen w PCR reflex     Status: None   Collection Time: 03/01/24 10:05 PM   Specimen: Stool  Result Value Ref Range Status   C Diff antigen NEGATIVE NEGATIVE Final   C Diff toxin NEGATIVE NEGATIVE Final   C Diff interpretation No C. difficile detected.  Final    Comment: Performed at [facility], [address], [place], [place] [address]  Urine Culture (for pregnant, neutropenic or urologic patients or patients with an indwelling urinary catheter)     Status: None   Collection Time: 03/02/24 10:11 PM   Specimen: Urine, Clean Catch  Result Value Ref Range Status   Specimen Description URINE, CLEAN CATCH  Final   Special Requests  NONE  Final   Culture   Final    NO GROWTH Performed at [facility], [address] [place], [place] [address]    Report Status 03/04/2024 FINAL  Final  Aerobic/Anaerobic Culture w Gram Stain (surgical/deep wound)     Status: None   Collection Time: 03/03/24  8:26 AM   Specimen: Abscess  Result Value Ref Range Status   Specimen Description ABSCESS  Final   Special Requests GALLBLADDER  Final   Gram Stain   Final    ABUNDANT WBC PRESENT, PREDOMINANTLY PMN RARE GRAM NEGATIVE RODS    Culture   Final    ABUNDANT ESCHERICHIA COLI NO ANAEROBES ISOLATED Performed at [facility], [address] [place], [place] [address]    Report Status 03/08/2024 FINAL  Final   Organism ID, Bacteria ESCHERICHIA COLI  Final      Susceptibility   Escherichia coli - MIC*    AMPICILLIN >=32 RESISTANT Resistant     CEFAZOLIN  (NON-URINE) 2 SENSITIVE Sensitive     CEFEPIME <=0.12 SENSITIVE Sensitive     ERTAPENEM <=0.12 SENSITIVE Sensitive     CEFTRIAXONE <=0.25 SENSITIVE Sensitive     CIPROFLOXACIN 0.5 INTERMEDIATE Intermediate     GENTAMICIN <=1 SENSITIVE Sensitive     MEROPENEM <=0.25  SENSITIVE Sensitive     TRIMETH/SULFA <=20 SENSITIVE Sensitive     AMPICILLIN/SULBACTAM 8 SENSITIVE Sensitive     PIP/TAZO Value in next row Sensitive      <=4 SENSITIVEThis is a modified FDA-approved test that has been validated and its performance characteristics determined by the reporting laboratory.  This laboratory is certified under the Clinical Laboratory Improvement Amendments CLIA as qualified to perform high complexity clinical laboratory testing.    * ABUNDANT ESCHERICHIA COLI     Radiology Studies: CT Angio Abd/Pel w/ and/or w/o Result Date: 03/08/2024 EXAM: CTA ABDOMEN AND PELVIS WITH AND WITHOUT CONTRAST 03/08/2024 07:09:03 PM TECHNIQUE: CTA images of the abdomen and pelvis without and with intravenous contrast. Three-dimensional MIP/volume rendered formations were performed. Automated exposure control, iterative reconstruction, and/or weight based adjustment of the mA/kV was utilized to reduce the radiation dose to as low as reasonably achievable. COMPARISON: 03/01/2024 CLINICAL HISTORY: Retroperitoneal bleed suspected. FINDINGS: VASCULATURE: AORTA: Extensive aortoiliac atherosclerotic calcification. No acute finding. No abdominal aortic aneurysm. No dissection. CELIAC TRUNK: No acute finding. No occlusion or significant stenosis. SUPERIOR MESENTERIC ARTERY: No acute finding. No occlusion or significant stenosis. RENAL ARTERIES: No acute finding. No occlusion or significant stenosis. ILIAC ARTERIES: Extensive aortoiliac atherosclerotic calcification. No acute finding. No occlusion or significant stenosis. LIVER: Interval transhepatic percutaneous cholecystostomy has been performed with decompression of the gallbladder. The retaining loop of the drainage catheter appears well positioned within the decompressed gallbladder lumen. There has developed with a sentinel clot seen within the right posterior perihepatic region extending into the subdiaphragmatic region and inferiorly into Morison's  pouch. There is, however, no active extravasation arising from the transhepatic puncture at this time. GALLBLADDER AND BILE DUCTS: Interval transhepatic percutaneous cholecystostomy has been performed with decompression of the gallbladder. The retaining loop of the drainage catheter appears well positioned within the decompressed gallbladder lumen. No biliary ductal dilatation. SPLEEN: The spleen is unremarkable. PANCREAS: Infiltration surrounding the tail of the pancreas, however, appears new and is nonspecific. This may represent infiltrative hemorrhage related to fluid within the lesser sac though superimposed acute interstitial / edematous pancreatitis could appear similarly. The pancreatic duct does not dilated. There is normal enhancement of the pancreatic parenchyma.  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 The left adrenal gland demonstrates no acute abnormality. KIDNEYS, URETERS AND BLADDER: Marked right renal cortical atrophy. Simple cortical cyst within the left kidney for which no follow up imaging is recommended. No stones in the kidneys or ureters. No hydronephrosis. No perinephric or periureteral stranding. The bladder is partially obscured by streak artifact, however, the visualized portion is unremarkable. GI AND BOWEL: Hi attenuation fluid collection adjacent to the greater curvature of the stomach represents hemoperitoneum within the lesser sac. Severe descending and sigmoid colonic diverticulosis without superimposed acute inflammatory change. Stomach and duodenal sweep demonstrate no acute abnormality. There is no bowel obstruction. No abnormal bowel wall thickening or distension. REPRODUCTIVE: Reproductive organs are unremarkable. PERITONEUM AND RETROPERITONEUM: Hi attenuation fluid collection adjacent to the greater curvature of the stomach represents hemoperitoneum within the lesser sac. A sentinel clot  seen within the right posterior perihepatic region extending into the subdiaphragmatic region and inferiorly into Morison's pouch. No free air. LUNG BASE: Small bilateral pleural effusions are now seen at the lung bases with associated bibasilar compressive atelectasis. LYMPH NODES: No lymphadenopathy. BONES AND SOFT TISSUES: Status post right total hip arthroplasty. Osseous structures are age appropriate. No acute bone abnormality. No lytic or blastic bone lesion. No acute soft tissue abnormality. HEART AND MEDIASTINUM: New hypoattenuation of the cardiac blood pool in keeping with at least moderate anemia. Extensive coronary artery calcification. Calcification of the aortic valve leaflets. Mild cardiomegaly. IMPRESSION: 1. No active extravasation arising from the transhepatic puncture at this time. No active gastrointestinal hemorrhage. 2. Hemoperitoneum, including sentinel clot within the right posterior perihepatic region extending into the subdiaphragmatic region and Morison pouch, and hemoperitoneum within the lesser sac. Given the interval cholecystostomy, this is favored to represent intraperitoneal hemorrhage related to the transhepatic puncture. 3. Interval transhepatic percutaneous cholecystostomy with decompression of the gallbladder and drainage catheter appropriately positioned. 4. New hypoattenuation of the cardiac blood pool, consistent with at least moderate anemia. 5. Nonspecific infiltration surrounding the tail of the pancreas, possibly representing infiltrative hemorrhage or superimposed acute interstitial/edematous pancreatitis. Correlation with serum amylase and lipase levels would be helpful for confirmation. 6. Small bilateral pleural effusions with associated bibasilar compressive atelectasis. 7. Extensive coronary artery calcification. 8. Mild cardiomegaly. 9. Marked right renal cortical atrophy. 10. Severe descending and sigmoid colonic diverticulosis without acute inflammatory change. 11.  Status post right total hip arthroplasty. Electronically signed by: Dorethia Molt MD 03/08/2024 07:59 PM EST RP Workstation: [id number]    Scheduled Meds:  Chlorhexidine  Gluconate Cloth  6 each Topical Q0600   lidocaine   1 patch Transdermal Q24H   sodium chloride  flush  5 mL Intracatheter Q8H   Continuous Infusions:  lactated ringers  250 mL/hr at 03/08/24 1049     LOS: 8 days   Norval Bar, MD  [facility]  03/09/2024, 2:10 PM   "

## 2024-03-09 NOTE — Plan of Care (Signed)

## 2024-03-10 ENCOUNTER — Inpatient Hospital Stay (HOSPITAL_COMMUNITY)

## 2024-03-10 DIAGNOSIS — N179 Acute kidney failure, unspecified: Secondary | ICD-10-CM | POA: Diagnosis not present

## 2024-03-10 HISTORY — PX: IR AORTA/THORACIC: IMG634

## 2024-03-10 HISTORY — PX: IR EXCHANGE BILIARY DRAIN: IMG6046

## 2024-03-10 HISTORY — PX: IR ANGIOGRAM SELECTIVE EACH ADDITIONAL VESSEL: IMG667

## 2024-03-10 HISTORY — PX: IR US GUIDE VASC ACCESS RIGHT: IMG2390

## 2024-03-10 HISTORY — PX: IR ANGIOGRAM VISCERAL SELECTIVE: IMG657

## 2024-03-10 LAB — CBC
HCT: 17.6 % — ABNORMAL LOW (ref 39.0–52.0)
HCT: 21.5 % — ABNORMAL LOW (ref 39.0–52.0)
Hemoglobin: 6 g/dL — CL (ref 13.0–17.0)
Hemoglobin: 7.3 g/dL — ABNORMAL LOW (ref 13.0–17.0)
MCH: 29.6 pg (ref 26.0–34.0)
MCH: 29.8 pg (ref 26.0–34.0)
MCHC: 34.1 g/dL (ref 30.0–36.0)
MCHC: 34 g/dL (ref 30.0–36.0)
MCV: 86.7 fL (ref 80.0–100.0)
MCV: 87.8 fL (ref 80.0–100.0)
Platelets: 108 K/uL — ABNORMAL LOW (ref 150–400)
Platelets: 93 K/uL — ABNORMAL LOW (ref 150–400)
RBC: 2.03 MIL/uL — ABNORMAL LOW (ref 4.22–5.81)
RBC: 2.45 MIL/uL — ABNORMAL LOW (ref 4.22–5.81)
RDW: 14.3 % (ref 11.5–15.5)
RDW: 14.3 % (ref 11.5–15.5)
WBC: 13.8 K/uL — ABNORMAL HIGH (ref 4.0–10.5)
WBC: 10.7 K/uL — ABNORMAL HIGH (ref 4.0–10.5)
nRBC: 0.2 % (ref 0.0–0.2)
nRBC: 0 % (ref 0.0–0.2)

## 2024-03-10 LAB — IRON AND TIBC
Iron: 53 ug/dL (ref 45–182)
Saturation Ratios: 21 % (ref 17.9–39.5)
TIBC: 251 ug/dL (ref 250–450)
UIBC: 197 ug/dL

## 2024-03-10 LAB — BASIC METABOLIC PANEL WITH GFR
Anion gap: 10 (ref 5–15)
BUN: 41 mg/dL — ABNORMAL HIGH (ref 8–23)
CO2: 25 mmol/L (ref 22–32)
Calcium: 7.4 mg/dL — ABNORMAL LOW (ref 8.9–10.3)
Chloride: 99 mmol/L (ref 98–111)
Creatinine, Ser: 4.7 mg/dL — ABNORMAL HIGH (ref 0.61–1.24)
GFR, Estimated: 12 mL/min — ABNORMAL LOW
Glucose, Bld: 102 mg/dL — ABNORMAL HIGH (ref 70–99)
Potassium: 4.4 mmol/L (ref 3.5–5.1)
Sodium: 135 mmol/L (ref 135–145)

## 2024-03-10 LAB — FOLATE: Folate: 3.8 ng/mL — ABNORMAL LOW

## 2024-03-10 LAB — HEPATIC FUNCTION PANEL
ALT: 24 U/L (ref 0–44)
AST: 43 U/L — ABNORMAL HIGH (ref 15–41)
Albumin: 2.7 g/dL — ABNORMAL LOW (ref 3.5–5.0)
Alkaline Phosphatase: 65 U/L (ref 38–126)
Bilirubin, Direct: 0.6 mg/dL — ABNORMAL HIGH (ref 0.0–0.2)
Indirect Bilirubin: 0.4 mg/dL (ref 0.3–0.9)
Total Bilirubin: 1 mg/dL (ref 0.0–1.2)
Total Protein: 5.2 g/dL — ABNORMAL LOW (ref 6.5–8.1)

## 2024-03-10 LAB — FERRITIN: Ferritin: 386 ng/mL — ABNORMAL HIGH (ref 24–336)

## 2024-03-10 LAB — PREPARE RBC (CROSSMATCH)

## 2024-03-10 LAB — VITAMIN B12: Vitamin B-12: 527 pg/mL (ref 180–914)

## 2024-03-10 MED ORDER — LIDOCAINE HCL 1 % IJ SOLN
INTRAMUSCULAR | Status: AC
  Filled 2024-03-10: qty 20

## 2024-03-10 MED ORDER — FENTANYL CITRATE (PF) 100 MCG/2ML IJ SOLN
INTRAMUSCULAR | Status: AC | PRN
  Administered 2024-03-10 (×2): 25 ug via INTRAVENOUS
  Administered 2024-03-10: 50 ug via INTRAVENOUS

## 2024-03-10 MED ORDER — IOHEXOL 300 MG/ML  SOLN
100.0000 mL | Freq: Once | INTRAMUSCULAR | Status: AC | PRN
  Administered 2024-03-10: 70 mL via INTRA_ARTERIAL

## 2024-03-10 MED ORDER — MIDAZOLAM HCL 2 MG/2ML IJ SOLN
INTRAMUSCULAR | Status: AC
  Filled 2024-03-10: qty 2

## 2024-03-10 MED ORDER — LIDOCAINE HCL 1 % IJ SOLN
20.0000 mL | Freq: Once | INTRAMUSCULAR | Status: AC
  Administered 2024-03-10: 10 mL

## 2024-03-10 MED ORDER — FENTANYL CITRATE (PF) 100 MCG/2ML IJ SOLN
INTRAMUSCULAR | Status: AC
  Filled 2024-03-10: qty 4

## 2024-03-10 MED ORDER — IOHEXOL 300 MG/ML  SOLN
100.0000 mL | Freq: Once | INTRAMUSCULAR | Status: AC | PRN
  Administered 2024-03-10: 80 mL via INTRA_ARTERIAL

## 2024-03-10 MED ORDER — IOHEXOL 300 MG/ML  SOLN
100.0000 mL | Freq: Once | INTRAMUSCULAR | Status: AC | PRN
  Administered 2024-03-10: 20 mL via INTRA_ARTERIAL

## 2024-03-10 MED ORDER — MIDAZOLAM HCL (PF) 2 MG/2ML IJ SOLN
INTRAMUSCULAR | Status: AC | PRN
  Administered 2024-03-10 (×2): 1 mg via INTRAVENOUS

## 2024-03-10 MED ORDER — FOLIC ACID 5 MG/ML IJ SOLN
1.0000 mg | Freq: Every day | INTRAMUSCULAR | Status: DC
  Administered 2024-03-10 – 2024-03-11 (×2): 1 mg via INTRAVENOUS
  Filled 2024-03-10 (×3): qty 0.2

## 2024-03-10 MED ORDER — SODIUM CHLORIDE 0.9% IV SOLUTION: Freq: Once | INTRAVENOUS | Status: AC

## 2024-03-10 NOTE — Plan of Care (Signed)

## 2024-03-10 NOTE — Plan of Care (Signed)

## 2024-03-10 NOTE — Progress Notes (Signed)
 "   Referring Physician(s): Sebastian Moles  Supervising Physician: Hughes Simmonds  Patient Status:  [facility] - In-pt  Chief Complaint: bleeding around perc chole drain site  Subjective: Patient with additional drop in Hgb to 6.0 overnight despite 2u PRBCs 12/26 and 1u 12/27. Overall reports no change in abdominal discomfort.  Remains oliguric although Scr improved to 4.7.  He desires intervention should it be offered as necessary.  He is NPO.   Allergies: Advil [ibuprofen] and Choline fenofibrate  Medications: Prior to Admission medications  Medication Sig Start Date End Date Taking? Authorizing Provider  atorvastatin  (LIPITOR) 40 MG tablet TAKE 1 TABLET BY MOUTH EVERY DAY 11/08/23  Yes Kennyth Worth HERO, MD  losartan  (COZAAR ) 50 MG tablet Take 1 tablet (50 mg total) by mouth daily. 02/02/24  Yes Kennyth Worth HERO, MD  metoprolol  succinate (TOPROL -XL) 25 MG 24 hr tablet Take 1 tablet (25 mg total) by mouth daily. Patient taking differently: Take 12.5 mg by mouth daily. 02/02/24  Yes Kennyth Worth HERO, MD  tadalafil  (CIALIS ) 20 MG tablet Take 0.5-1 tablets (10-20 mg total) by mouth daily as needed for erectile dysfunction. 11/09/23   Kennyth Worth HERO, MD     Vital Signs: BP 114/68   Pulse 87   Temp (!) 97.5 F (36.4 C) (Oral)   Resp 18   Ht 6' 1 (1.854 m)   Wt 203 lb 4.2 oz (92.2 kg)   SpO2 96%   BMI 26.82 kg/m   Physical Exam Cardiovascular:     Rate and Rhythm: Normal rate.  Pulmonary:     Effort: Pulmonary effort is normal. No respiratory distress.  Abdominal:     Palpations: Abdomen is soft.     Tenderness: There is no abdominal tenderness.     Comments: Abdomen taut and distended without tenderness. Perc chole drain in place.  Ongoing combination of old bloody drainage with small amount of fresh blood on dressing at insertion site. Bilious output in gravity bag.  Suture remains intact.   Skin:    General: Skin is warm.  Neurological:     Mental Status: He is alert and  oriented to person, place, and time.  Psychiatric:        Mood and Affect: Mood normal.        Behavior: Behavior normal.     Imaging: CT Angio Abd/Pel w/ and/or w/o Result Date: 03/08/2024 EXAM: CTA ABDOMEN AND PELVIS WITH AND WITHOUT CONTRAST 03/08/2024 07:09:03 PM TECHNIQUE: CTA images of the abdomen and pelvis without and with intravenous contrast. Three-dimensional MIP/volume rendered formations were performed. Automated exposure control, iterative reconstruction, and/or weight based adjustment of the mA/kV was utilized to reduce the radiation dose to as low as reasonably achievable. COMPARISON: 03/01/2024 CLINICAL HISTORY: Retroperitoneal bleed suspected. FINDINGS: VASCULATURE: AORTA: Extensive aortoiliac atherosclerotic calcification. No acute finding. No abdominal aortic aneurysm. No dissection. CELIAC TRUNK: No acute finding. No occlusion or significant stenosis. SUPERIOR MESENTERIC ARTERY: No acute finding. No occlusion or significant stenosis. RENAL ARTERIES: No acute finding. No occlusion or significant stenosis. ILIAC ARTERIES: Extensive aortoiliac atherosclerotic calcification. No acute finding. No occlusion or significant stenosis. LIVER: Interval transhepatic percutaneous cholecystostomy has been performed with decompression of the gallbladder. The retaining loop of the drainage catheter appears well positioned within the decompressed gallbladder lumen. There has developed with a sentinel clot seen within the right posterior perihepatic region extending into the subdiaphragmatic region and inferiorly into Morison's pouch. There is, however, no active extravasation arising from the transhepatic puncture at this time.  GALLBLADDER AND BILE DUCTS: Interval transhepatic percutaneous cholecystostomy has been performed with decompression of the gallbladder. The retaining loop of the drainage catheter appears well positioned within the decompressed gallbladder lumen. No biliary ductal dilatation.  SPLEEN: The spleen is unremarkable. PANCREAS: Infiltration surrounding the tail of the pancreas, however, appears new and is nonspecific. This may represent infiltrative hemorrhage related to fluid within the lesser sac though superimposed acute interstitial / edematous pancreatitis could appear similarly. The pancreatic duct does not dilated. There is normal enhancement of the pancreatic parenchyma. 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 The left adrenal gland demonstrates no acute abnormality. KIDNEYS, URETERS AND BLADDER: Marked right renal cortical atrophy. Simple cortical cyst within the left kidney for which no follow up imaging is recommended. No stones in the kidneys or ureters. No hydronephrosis. No perinephric or periureteral stranding. The bladder is partially obscured by streak artifact, however, the visualized portion is unremarkable. GI AND BOWEL: Hi attenuation fluid collection adjacent to the greater curvature of the stomach represents hemoperitoneum within the lesser sac. Severe descending and sigmoid colonic diverticulosis without superimposed acute inflammatory change. Stomach and duodenal sweep demonstrate no acute abnormality. There is no bowel obstruction. No abnormal bowel wall thickening or distension. REPRODUCTIVE: Reproductive organs are unremarkable. PERITONEUM AND RETROPERITONEUM: Hi attenuation fluid collection adjacent to the greater curvature of the stomach represents hemoperitoneum within the lesser sac. A sentinel clot seen within the right posterior perihepatic region extending into the subdiaphragmatic region and inferiorly into Morison's pouch. No free air. LUNG BASE: Small bilateral pleural effusions are now seen at the lung bases with associated bibasilar compressive atelectasis. LYMPH NODES: No lymphadenopathy. BONES AND SOFT TISSUES: Status post right total hip arthroplasty. Osseous  structures are age appropriate. No acute bone abnormality. No lytic or blastic bone lesion. No acute soft tissue abnormality. HEART AND MEDIASTINUM: New hypoattenuation of the cardiac blood pool in keeping with at least moderate anemia. Extensive coronary artery calcification. Calcification of the aortic valve leaflets. Mild cardiomegaly. IMPRESSION: 1. No active extravasation arising from the transhepatic puncture at this time. No active gastrointestinal hemorrhage. 2. Hemoperitoneum, including sentinel clot within the right posterior perihepatic region extending into the subdiaphragmatic region and Morison pouch, and hemoperitoneum within the lesser sac. Given the interval cholecystostomy, this is favored to represent intraperitoneal hemorrhage related to the transhepatic puncture. 3. Interval transhepatic percutaneous cholecystostomy with decompression of the gallbladder and drainage catheter appropriately positioned. 4. New hypoattenuation of the cardiac blood pool, consistent with at least moderate anemia. 5. Nonspecific infiltration surrounding the tail of the pancreas, possibly representing infiltrative hemorrhage or superimposed acute interstitial/edematous pancreatitis. Correlation with serum amylase and lipase levels would be helpful for confirmation. 6. Small bilateral pleural effusions with associated bibasilar compressive atelectasis. 7. Extensive coronary artery calcification. 8. Mild cardiomegaly. 9. Marked right renal cortical atrophy. 10. Severe descending and sigmoid colonic diverticulosis without acute inflammatory change. 11. Status post right total hip arthroplasty. Electronically signed by: Dorethia Molt MD 03/08/2024 07:59 PM EST RP Workstation: [id number]    Labs:  CBC: Recent Labs    03/08/24 1058 03/08/24 1312 03/08/24 2030 03/09/24 0452 03/09/24 1319 03/10/24 0722  WBC 10.9*  --   --  9.8 11.7* 13.8*  HGB 6.2*   < > 7.2* 6.9* 7.4* 6.0*  HCT 18.4*   < > 21.3* 19.9* 21.5*  17.6*  PLT 86*  --   --  82* 86* 108*   < > =  values in this interval not displayed.    COAGS: Recent Labs    03/02/24 1338  INR 1.0    BMP: Recent Labs    03/07/24 0500 03/08/24 1100 03/09/24 0452 03/10/24 0718  NA 138 138 137 135  K 3.3* 4.0 4.0 4.4  CL 102 102 102 99  CO2 24 24 23 25   GLUCOSE 105* 124* 92 102*  BUN 50* 61* 66* 41*  CALCIUM  7.7* 7.6* 7.5* 7.4*  CREATININE 4.97* 6.17* 6.45* 4.70*  GFRNONAA 11* 9* 8* 12*    LIVER FUNCTION TESTS: Recent Labs    03/06/24 0300 03/07/24 0500 03/08/24 1058 03/08/24 1100 03/09/24 0452 03/10/24 0718  BILITOT 1.3*  --  0.9  --  1.0 1.0  AST 22  --  43*  --  40 43*  ALT 16  --  24  --  22 24  ALKPHOS 78  --  54  --  56 65  PROT 5.7*  --  5.0*  --  5.2* 5.2*  ALBUMIN 2.6*  2.6*   < > 2.6* 2.6* 2.6* 2.7*   < > = values in this interval not displayed.    Assessment and Plan: Acute cholecystitis s/p percutaneous cholecystomy tube placement 03/03/25 by Dr. Vanice. After several days of stability with adequate bilious drainage from his perc chole, patient suddenly developed fatigue, dizziness while standing, and hypotension.  Lab work reveal acute, severe anemia. CTA Abdomen Pelvis showed hemoperitoneum, including sentinel clot within the right posterior perihepatic region extending into the subdiaphragmatic region and Morison pouch, and hemoperitoneum within the lesser sac.  Blood transfusions and resuscitation have been marginally effective, however he continues to experience drops in hemoglobin with another decrease in Hgb to 6.0 this AM.  2 additional units of blood to be administered today.  Have discussed the fragile state of his tube placement with the patient including options for IR-directed intervention to try to manage his likely capsular bleed. After discussion he would to open to additional intervention as offered.  Dr. Jennefer has reviewed case and will plan to proceed with angiogram/embolization, possible  cholecystostomy tube upsize and exchange.  This was discussed thoroughly with patient and his wife via phone.  Also discussed his current renal status as he was initiated on temporary HD this admission with hope for renal recovery.  Dr. Cosette and Dr. Melia aware of plans for IR intervention.  Patient's long-term goal is to avoid OR if possible and to return home with the ability to care of himself and his wife.   Currently NPO.   2nd unit of PRBC infusing at time of visit.  Will allow to complete before sending for patient.   The Risks and benefits of angiogram/embolization were discussed with the patient including, but not limited to bleeding, infection, vascular injury, post operative pain, or contrast induced renal failure.  This procedure involves the use of X-rays and because of the nature of the planned procedure, it is possible that we will have prolonged use of X-ray fluoroscopy.  Potential radiation risks to you include (but are not limited to) the following: - A slightly elevated risk for cancer several years later in life. This risk is typically less than 0.5% percent. This risk is low in comparison to the normal incidence of human cancer, which is 33% for women and 50% for men according to the [employer]. - Radiation induced injury can include skin redness, resembling a rash, tissue breakdown / ulcers and hair loss (which can be  temporary or permanent).   The likelihood of either of these occurring depends on the difficulty of the procedure and whether you are sensitive to radiation due to previous procedures, disease, or genetic conditions.   IF your procedure requires a prolonged use of radiation, you will be notified and given written instructions for further action.  It is your responsibility to monitor the irradiated area for the 2 weeks following the procedure and to notify your physician if you are concerned that you have suffered a radiation induced injury.     All of the patient's questions were answered, patient is agreeable to proceed. Consent signed and in chart.   Electronically Signed: Roylene Heaton Sue-Ellen Warnie Belair, PA 03/10/2024, 1:17 PM   I spent a total of 15 Minutes at the the patient's bedside AND on the patient's hospital floor or unit, greater than 50% of which was counseling/coordinating care for acute cholecystitis, hemoperitoneum.   "

## 2024-03-10 NOTE — Progress Notes (Signed)
 " PROGRESS NOTE    [name]  FMW:[id number] DOB: [birth year] DOA: 03/01/2024 PCP: Kennyth Worth HERO, MD  Subjective: Noted to have acute urinary retention overnight with bladder scan > 500 twice for which he got straight cath twice with only ~150cc urine output. Unfortunately labs were not performed overnight to trend CBC. Seen an d examined at bedside. Reports generalized weakness and fatigue this morning with no visible signs of bleeding.    Hospital Course:  78 year old male with PMHx of HTN and HLD who presented to the ED on 03/01/2024 with several days of nausea, vomiting, and diarrhea. Symptoms began on 12/14 after returning from church and eating a frozen [employer] chicken meal, followed about an hour later by chills/subjective fevers with rigors, then profuse emesis and subsequent multiple episodes of loose watery stools. Emesis progressed from food contents to watery, nonbloody. Stools watery without blood or mucus. He reports poor p.o. intake since Sunday and generalized, nonfocal abdominal discomfort. Also notes markedly decreased urine output over the past 2 days with dark orange urine, without gross hematuria. In the ED he was found to have AKI and hypokalemia, with CT abdomen findings concerning for acute cholecystitis. He was admitted to the hospital service for further management. Now s/p perc chole tube. Has temp HD cath and is getting HD per renal. Dispo pending renal recovery at this point.    Assessment and Plan:  Hypotension Hemoperitoneum Acute blood loss anemia Acute thrombocytopenia Folate deficiency History of HTN - low BP likely due to hemoperitoneum due to biliary drain placement, biliary output, and poor oral intake - Hgb 6 < 7.4, trough at 5.5, baseline 13-14  - platelets 108 < 86, baseline > 150 - folate 3.8 - Continue holding home Losartan  (also in setting of AKI) - hold Troprol-XL 25 mg daily - cont IV fluids - will get 2 units pRBC today - start  IV folic acid  given severe symptomatic anemia - transfuse for Hgb goal > 7 - transfuse for platelet goal > 50 given concerns of acute bleed - NPO for now - reached out to IR team, awaiting further recs - IR team following   AKI on CKD stage IIIa  Oliguria High anion gap metabolic acidosis - Likely prerenal in the setting of decreased PO intake and increased GI losses with concomitant Losartan  use. Nephrology suspecting ATN given no obstruction.  - Cr 4.7 < 6.45, peaked to 7.9 12/23 - now s/p temp HD cath  - dialysis as per nephro recs - nephrology following   Acute hypoxic respiratory failure  - Currently on 3 < 2L , not on O2 at baseline  - likely multifactorial in the setting of acute symptomatic anemia, atelectasis from abdominal distension, splinting from acute cholecystitis and biliary drain site pain - clinically hypovolemic given acute bleeding as elsewhere - Echo (12/22) showed LVEF 55-60% with no RWMA, mild concentric LVH, G1DD, normal RV function, no valvular abnormalities - CXR elevated R hemidiaphragm with R basilar opacity (atelectasis vs PNA) and L basilar atelectasis, rounded R paratracheal density (tortuous vessel vs adenopathy), recommended CT chest w/ contrast. No effusion or PTX.  - HD sessions as per nephro recs - treatment of acute blood loss anemia as elsewhere - continue to wean oxygen as tolerated  - nephrology following  Acute urinary retention - etiology unclear - Likely multifactorial in the setting of acute illness with decreased mobility, hemoperitoneum, oliguric AKI on CKD - large discrepancy in bladder scan readings and voided urine on straight  caths - concern patient with ongoing hemoperitoneum that is making it hard accurately measure bladder volumes - having regular Bms - cont with intermittent bladder scans if unable to void for 8 hours - straight cath as needed for bladder volume > 400    Sepsis Acute cholecystitis - Has RUQ pain on palpation  with CT evidence of acute cholecystitis without CBD dilation - now s/p perc chole drain 12/21 - culture growing e. Coli  - Finished 5 days of IV Zosyn  post-biliary drain placement on 12/26 - may need to touch base with IR to evaluate the biliary drain if bloody output continues - Needs follow up in general surgery office in 6 weeks to discuss interval cholecystectomy.  -  Needs follow up with IR to have cholangiogram before his appt.  - Watch for high biliary output. - General surgery following - IR team following   PVCs  NSVT - Tele alerted to multiple runs of VT on 12/21 during which patient was asymptomatic and HDS. - Patient with known history of high PVC burden. - Cardiology consulted on 12/21, determined NSVT with elevated burden of PVC worsened by ongoing acute illness, recommended echo and electrolyte replacement - Echo without concerning findings, as noted above - hold Toprol -XL 25 mg given hypotension as elsewhere  - hold amiodarone given hypotension and AKI on CKD - cardiology has signed off - last note 12/25 - will need to follow up with cardiology outpatient for non-invasive ischemic evaluation   Gastroenteritis - Patient presented with N/V and diarrhea which started after eating frozen meal - No further episodes of diarrhea, nausea, or vomiting.  Tolerating renal diet - GIPP and C diff PCR negative - Continue conservative management - Abx as elsewhere for acute cholecystitis - NPO at present with IVFs running given acute bleeding - Advance diet as tolerated - PRN Zofran   - PRN imodium    Lactic acidosis - resolved - likely due to hemoperitoneum, hypovolemic/hypotension - IV fluids as elsewhere - finished antibiotics course on 12/26 - trend lactate   UTI ruled out - Urine culture showed no growth - Abx as elsewhere for acute cholecystitis   Hypokalemia  Resolved with supplementation Monitor and replete electrolytes as needed   Left renal mass - CT abd/pel  showed soft tissue mass on left kidney - Will need MRI with contrast per renal mass protocol as outpatient   Abnormal CXR Right paratracheal density, tortuous vasculature vs adenopathy, needs CT with contrast when able - currently on hold with AKI   Transaminitis Direct Hyperbilirubinemia  - improving - Possibly related to acute illness in the setting of cholecystitis -  AST, ALT, and ALP normalized - T. Bili improving   HLD - Statin held due to transaminitis  DVT prophylaxis:   SCDs   Code Status: Full Code Family Communication: updated wife by phone during patient encounter at bedside Disposition Plan: TBD Reason for continuing need for hospitalization: severity of illness  Objective: Vitals:   03/10/24 0745 03/10/24 0815 03/10/24 0835 03/10/24 1022  BP: 104/65 112/68 96/67 (!) 108/53  Pulse: 97  93 90  Resp: 18     Temp: 97.9 F (36.6 C) (!) 97.3 F (36.3 C) (!) 97.3 F (36.3 C) 98.2 F (36.8 C)  TempSrc: Oral Oral Oral Oral  SpO2: 96%     Weight:      Height:        Intake/Output Summary (Last 24 hours) at 03/10/2024 1033 Last data filed at 03/10/2024 0800 Gross per 24  hour  Intake 1618.23 ml  Output 1397 ml  Net 221.23 ml   Filed Weights   03/09/24 0823 03/09/24 1258 03/10/24 0300  Weight: 90.2 kg (S) 90.1 kg 92.2 kg    Examination:  Physical Exam Vitals and nursing note reviewed.  Constitutional:      General: He is in acute distress.     Appearance: He is ill-appearing.     Comments: Weak, frail  HENT:     Head: Normocephalic and atraumatic.  Cardiovascular:     Rate and Rhythm: Normal rate and regular rhythm.     Pulses: Normal pulses.     Heart sounds: Normal heart sounds.  Pulmonary:     Effort: Pulmonary effort is normal. No respiratory distress.     Breath sounds: No wheezing.     Comments: Bibasilar crackles Abdominal:     General: Bowel sounds are normal. There is distension.     Palpations: Abdomen is soft. There is no mass.      Tenderness: There is no abdominal tenderness. There is no guarding or rebound.     Hernia: No hernia is present.  Musculoskeletal:     Right lower leg: No edema.     Left lower leg: No edema.  Neurological:     Mental Status: He is alert.     Data Reviewed: I have personally reviewed following labs and imaging studies  CBC: Recent Labs  Lab 03/07/24 0500 03/08/24 1058 03/08/24 1312 03/08/24 2030 03/09/24 0452 03/09/24 1319 03/10/24 0722  WBC 8.1 10.9*  --   --  9.8 11.7* 13.8*  NEUTROABS 6.3  --   --   --   --   --   --   HGB 11.0* 6.2* 5.5* 7.2* 6.9* 7.4* 6.0*  HCT 32.3* 18.4* 16.8* 21.3* 19.9* 21.5* 17.6*  MCV 85.9 86.8  --   --  86.1 85.3 86.7  PLT 88* 86*  --   --  82* 86* 108*   Basic Metabolic Panel: Recent Labs  Lab 03/03/24 1324 03/04/24 0244 03/05/24 0345 03/06/24 0300 03/07/24 0500 03/08/24 1100 03/09/24 0452 03/10/24 0718  NA  --    < > 136 136 138 138 137 135  K  --    < > 4.0 3.4* 3.3* 4.0 4.0 4.4  CL  --    < > 103 102 102 102 102 99  CO2  --    < > 14* 22 24 24 23 25   GLUCOSE  --    < > 77 110* 105* 124* 92 102*  BUN  --    < > 96* 70* 50* 61* 66* 41*  CREATININE  --    < > 7.94* 6.23* 4.97* 6.17* 6.45* 4.70*  CALCIUM   --    < > 7.9* 7.7* 7.7* 7.6* 7.5* 7.4*  MG 2.0  --   --   --  1.8  --   --   --   PHOS  --   --  5.7* 4.1 3.4 5.6*  --   --    < > = values in this interval not displayed.   GFR: Estimated Creatinine Clearance: 15.1 mL/min (A) (by C-G formula based on SCr of 4.7 mg/dL (H)). Liver Function Tests: Recent Labs  Lab 03/04/24 0244 03/05/24 0344 03/05/24 0345 03/06/24 0300 03/07/24 0500 03/08/24 1058 03/08/24 1100 03/09/24 0452  AST 31 25  --  22  --  43*  --  40  ALT 27 20  --  16  --  24  --  22  ALKPHOS 108 87  --  78  --  54  --  56  BILITOT 1.7* 1.3*  --  1.3*  --  0.9  --  1.0  PROT 6.1* 5.8*  --  5.7*  --  5.0*  --  5.2*  ALBUMIN 2.7* 2.5*   < > 2.6*  2.6* 2.7* 2.6* 2.6* 2.6*   < > = values in this interval not  displayed.   Recent Labs  Lab 03/09/24 0452  LIPASE 100*   No results for input(s): AMMONIA in the last 168 hours. Coagulation Profile: No results for input(s): INR, PROTIME in the last 168 hours. Cardiac Enzymes: No results for input(s): CKTOTAL, CKMB, CKMBINDEX, TROPONINI in the last 168 hours. ProBNP, BNP (last 5 results) Recent Labs    03/04/24 1129  PROBNP 18,836.0*   HbA1C: No results for input(s): HGBA1C in the last 72 hours. CBG: Recent Labs  Lab 03/09/24 0622 03/09/24 1600  GLUCAP 95 103*   Lipid Profile: No results for input(s): CHOL, HDL, LDLCALC, TRIG, CHOLHDL, LDLDIRECT in the last 72 hours. Thyroid  Function Tests: No results for input(s): TSH, T4TOTAL, FREET4, T3FREE, THYROIDAB in the last 72 hours. Anemia Panel: Recent Labs    03/10/24 0718  VITAMINB12 527  FOLATE 3.8*  FERRITIN 386*  TIBC 251  IRON 53   Sepsis Labs: Recent Labs  Lab 03/07/24 1733 03/07/24 1946 03/08/24 1058  LATICACIDVEN 3.8* 2.5* 1.3    Recent Results (from the past 240 hours)  Resp panel by RT-PCR (RSV, Flu A&B, Covid) Anterior Nasal Swab     Status: None   Collection Time: 03/01/24  3:08 PM   Specimen: Anterior Nasal Swab  Result Value Ref Range Status   SARS Coronavirus 2 by RT PCR NEGATIVE NEGATIVE Final    Comment: (NOTE) SARS-CoV-2 target nucleic acids are NOT DETECTED.  The SARS-CoV-2 RNA is generally detectable in upper respiratory specimens during the acute phase of infection. The lowest concentration of SARS-CoV-2 viral copies this assay can detect is 138 copies/mL. A negative result does not preclude SARS-Cov-2 infection and should not be used as the sole basis for treatment or other patient management decisions. A negative result may occur with  improper specimen collection/handling, submission of specimen other than nasopharyngeal swab, presence of viral mutation(s) within the areas targeted by this assay, and  inadequate number of viral copies(<138 copies/mL). A negative result must be combined with clinical observations, patient history, and epidemiological information. The expected result is Negative.  Fact Sheet for Patients:  bloggercourse.com  Fact Sheet for Healthcare Providers:  seriousbroker.it  This test is no t yet approved or cleared by the [place]  FDA and  has been authorized for detection and/or diagnosis of SARS-CoV-2 by FDA under an Emergency Use Authorization (EUA). This EUA will remain  in effect (meaning this test can be used) for the duration of the COVID-19 declaration under Section 564(b)(1) of the Act, 21 U.S.C.section 360bbb-3(b)(1), unless the authorization is terminated  or revoked sooner.       Influenza A by PCR NEGATIVE NEGATIVE Final   Influenza B by PCR NEGATIVE NEGATIVE Final    Comment: (NOTE) The Xpert Xpress SARS-CoV-2/FLU/RSV plus assay is intended as an aid in the diagnosis of influenza from Nasopharyngeal swab specimens and should not be used as a sole basis for treatment. Nasal washings and aspirates are unacceptable for Xpert Xpress SARS-CoV-2/FLU/RSV testing.  Fact Sheet for Patients: bloggercourse.com  Fact Sheet for Healthcare Providers:  seriousbroker.it  This test is not yet approved or cleared by the [place]  FDA and has been authorized for detection and/or diagnosis of SARS-CoV-2 by FDA under an Emergency Use Authorization (EUA). This EUA will remain in effect (meaning this test can be used) for the duration of the COVID-19 declaration under Section 564(b)(1) of the Act, 21 U.S.C. section 360bbb-3(b)(1), unless the authorization is terminated or revoked.     Resp Syncytial Virus by PCR NEGATIVE NEGATIVE Final    Comment: (NOTE) Fact Sheet for Patients: bloggercourse.com  Fact Sheet for Healthcare  Providers: seriousbroker.it  This test is not yet approved or cleared by the [place]  FDA and has been authorized for detection and/or diagnosis of SARS-CoV-2 by FDA under an Emergency Use Authorization (EUA). This EUA will remain in effect (meaning this test can be used) for the duration of the COVID-19 declaration under Section 564(b)(1) of the Act, 21 U.S.C. section 360bbb-3(b)(1), unless the authorization is terminated or revoked.  Performed at [facility], [address] [place], [place] [address]   Blood culture (routine x 2)     Status: None   Collection Time: 03/01/24  5:25 PM   Specimen: BLOOD  Result Value Ref Range Status   Specimen Description   Final    BLOOD RIGHT ANTECUBITAL Performed at [facility], [address] [place], [place] [address]    Special Requests   Final    BOTTLES DRAWN AEROBIC AND ANAEROBIC Blood Culture adequate volume Performed at [facility], [address] [place], [place] [address]    Culture   Final    NO GROWTH 5 DAYS Performed at [facility], [address], [place], [place] [address]    Report Status 03/06/2024 FINAL  Final  Blood culture (routine x 2)     Status: None   Collection Time: 03/01/24  5:41 PM   Specimen: BLOOD RIGHT FOREARM  Result Value Ref Range Status   Specimen Description   Final    BLOOD RIGHT FOREARM Performed at [facility], [address] [place], [place] [address]    Special Requests   Final    BOTTLES DRAWN AEROBIC AND ANAEROBIC Blood Culture adequate volume Performed at [facility], [address] [place], [place] [address]    Culture   Final    NO GROWTH 5 DAYS Performed at [facility], [address] [place], [place] [address]    Report Status 03/06/2024 FINAL  Final  Gastrointestinal Panel by PCR , Stool     Status: None   Collection Time: 03/01/24 10:05 PM   Specimen: Stool  Result Value  Ref Range Status   Campylobacter species NOT DETECTED NOT DETECTED Final   Plesimonas shigelloides NOT DETECTED NOT DETECTED Final   Salmonella species NOT DETECTED NOT DETECTED Final   Yersinia enterocolitica NOT DETECTED NOT DETECTED Final   Vibrio species NOT DETECTED NOT DETECTED Final   Vibrio cholerae NOT DETECTED NOT DETECTED Final   Enteroaggregative E coli (EAEC) NOT DETECTED NOT DETECTED Final   Enteropathogenic E coli (EPEC) NOT DETECTED NOT DETECTED Final   Enterotoxigenic E coli (ETEC) NOT DETECTED NOT DETECTED Final   Shiga like toxin producing E coli (STEC) NOT DETECTED NOT DETECTED Final   Shigella/Enteroinvasive E coli (EIEC) NOT DETECTED NOT DETECTED Final   Cryptosporidium NOT DETECTED NOT DETECTED Final   Cyclospora cayetanensis NOT DETECTED NOT DETECTED Final  Entamoeba histolytica NOT DETECTED NOT DETECTED Final   Giardia lamblia NOT DETECTED NOT DETECTED Final   Adenovirus F40/41 NOT DETECTED NOT DETECTED Final   Astrovirus NOT DETECTED NOT DETECTED Final   Norovirus GI/GII NOT DETECTED NOT DETECTED Final   Rotavirus A NOT DETECTED NOT DETECTED Final   Sapovirus (I, II, IV, and V) NOT DETECTED NOT DETECTED Final    Comment: Performed at [facility], [address], [place], [place] [address]  C Difficile Quick Screen w PCR reflex     Status: None   Collection Time: 03/01/24 10:05 PM   Specimen: Stool  Result Value Ref Range Status   C Diff antigen NEGATIVE NEGATIVE Final   C Diff toxin NEGATIVE NEGATIVE Final   C Diff interpretation No C. difficile detected.  Final    Comment: Performed at [facility], [address] [place], [place] [address]  Urine Culture (for pregnant, neutropenic or urologic patients or patients with an indwelling urinary catheter)     Status: None   Collection Time: 03/02/24 10:11 PM   Specimen: Urine, Clean Catch  Result Value Ref Range Status   Specimen Description URINE, CLEAN CATCH  Final   Special Requests NONE   Final   Culture   Final    NO GROWTH Performed at [facility], [address], [place], [place] [address]    Report Status 03/04/2024 FINAL  Final  Aerobic/Anaerobic Culture w Gram Stain (surgical/deep wound)     Status: None   Collection Time: 03/03/24  8:26 AM   Specimen: Abscess  Result Value Ref Range Status   Specimen Description ABSCESS  Final   Special Requests GALLBLADDER  Final   Gram Stain   Final    ABUNDANT WBC PRESENT, PREDOMINANTLY PMN RARE GRAM NEGATIVE RODS    Culture   Final    ABUNDANT ESCHERICHIA COLI NO ANAEROBES ISOLATED Performed at [facility], [address], [place], [place] [address]    Report Status 03/08/2024 FINAL  Final   Organism ID, Bacteria ESCHERICHIA COLI  Final      Susceptibility   Escherichia coli - MIC*    AMPICILLIN >=32 RESISTANT Resistant     CEFAZOLIN  (NON-URINE) 2 SENSITIVE Sensitive     CEFEPIME <=0.12 SENSITIVE Sensitive     ERTAPENEM <=0.12 SENSITIVE Sensitive     CEFTRIAXONE <=0.25 SENSITIVE Sensitive     CIPROFLOXACIN 0.5 INTERMEDIATE Intermediate     GENTAMICIN <=1 SENSITIVE Sensitive     MEROPENEM <=0.25 SENSITIVE Sensitive     TRIMETH/SULFA <=20 SENSITIVE Sensitive     AMPICILLIN/SULBACTAM 8 SENSITIVE Sensitive     PIP/TAZO Value in next row Sensitive      <=4 SENSITIVEThis is a modified FDA-approved test that has been validated and its performance characteristics determined by the reporting laboratory.  This laboratory is certified under the Clinical Laboratory Improvement Amendments CLIA as qualified to perform high complexity clinical laboratory testing.    * ABUNDANT ESCHERICHIA COLI     Radiology Studies: CT Angio Abd/Pel w/ and/or w/o Result Date: 03/08/2024 EXAM: CTA ABDOMEN AND PELVIS WITH AND WITHOUT CONTRAST 03/08/2024 07:09:03 PM TECHNIQUE: CTA images of the abdomen and pelvis without and with intravenous contrast. Three-dimensional MIP/volume rendered formations were performed. Automated exposure  control, iterative reconstruction, and/or weight based adjustment of the mA/kV was utilized to reduce the radiation dose to as low as reasonably achievable. COMPARISON: 03/01/2024 CLINICAL HISTORY: Retroperitoneal bleed suspected. FINDINGS: VASCULATURE: AORTA: Extensive aortoiliac atherosclerotic calcification. No  acute finding. No abdominal aortic aneurysm. No dissection. CELIAC TRUNK: No acute finding. No occlusion or significant stenosis. SUPERIOR MESENTERIC ARTERY: No acute finding. No occlusion or significant stenosis. RENAL ARTERIES: No acute finding. No occlusion or significant stenosis. ILIAC ARTERIES: Extensive aortoiliac atherosclerotic calcification. No acute finding. No occlusion or significant stenosis. LIVER: Interval transhepatic percutaneous cholecystostomy has been performed with decompression of the gallbladder. The retaining loop of the drainage catheter appears well positioned within the decompressed gallbladder lumen. There has developed with a sentinel clot seen within the right posterior perihepatic region extending into the subdiaphragmatic region and inferiorly into Morison's pouch. There is, however, no active extravasation arising from the transhepatic puncture at this time. GALLBLADDER AND BILE DUCTS: Interval transhepatic percutaneous cholecystostomy has been performed with decompression of the gallbladder. The retaining loop of the drainage catheter appears well positioned within the decompressed gallbladder lumen. No biliary ductal dilatation. SPLEEN: The spleen is unremarkable. PANCREAS: Infiltration surrounding the tail of the pancreas, however, appears new and is nonspecific. This may represent infiltrative hemorrhage related to fluid within the lesser sac though superimposed acute interstitial / edematous pancreatitis could appear similarly. The pancreatic duct does not dilated. There is normal enhancement of the pancreatic parenchyma. 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 The left adrenal gland demonstrates no acute abnormality. KIDNEYS, URETERS AND BLADDER: Marked right renal cortical atrophy. Simple cortical cyst within the left kidney for which no follow up imaging is recommended. No stones in the kidneys or ureters. No hydronephrosis. No perinephric or periureteral stranding. The bladder is partially obscured by streak artifact, however, the visualized portion is unremarkable. GI AND BOWEL: Hi attenuation fluid collection adjacent to the greater curvature of the stomach represents hemoperitoneum within the lesser sac. Severe descending and sigmoid colonic diverticulosis without superimposed acute inflammatory change. Stomach and duodenal sweep demonstrate no acute abnormality. There is no bowel obstruction. No abnormal bowel wall thickening or distension. REPRODUCTIVE: Reproductive organs are unremarkable. PERITONEUM AND RETROPERITONEUM: Hi attenuation fluid collection adjacent to the greater curvature of the stomach represents hemoperitoneum within the lesser sac. A sentinel clot seen within the right posterior perihepatic region extending into the subdiaphragmatic region and inferiorly into Morison's pouch. No free air. LUNG BASE: Small bilateral pleural effusions are now seen at the lung bases with associated bibasilar compressive atelectasis. LYMPH NODES: No lymphadenopathy. BONES AND SOFT TISSUES: Status post right total hip arthroplasty. Osseous structures are age appropriate. No acute bone abnormality. No lytic or blastic bone lesion. No acute soft tissue abnormality. HEART AND MEDIASTINUM: New hypoattenuation of the cardiac blood pool in keeping with at least moderate anemia. Extensive coronary artery calcification. Calcification of the aortic valve leaflets. Mild cardiomegaly. IMPRESSION: 1. No active extravasation arising from the transhepatic puncture at this time. No active  gastrointestinal hemorrhage. 2. Hemoperitoneum, including sentinel clot within the right posterior perihepatic region extending into the subdiaphragmatic region and Morison pouch, and hemoperitoneum within the lesser sac. Given the interval cholecystostomy, this is favored to represent intraperitoneal hemorrhage related to the transhepatic puncture. 3. Interval transhepatic percutaneous cholecystostomy with decompression of the gallbladder and drainage catheter appropriately positioned. 4. New hypoattenuation of the cardiac blood pool, consistent with at least moderate anemia. 5. Nonspecific infiltration surrounding the tail of the pancreas, possibly representing infiltrative hemorrhage or superimposed acute interstitial/edematous pancreatitis. Correlation with serum amylase and lipase levels would be helpful for confirmation. 6. Small bilateral pleural effusions with associated bibasilar compressive  atelectasis. 7. Extensive coronary artery calcification. 8. Mild cardiomegaly. 9. Marked right renal cortical atrophy. 10. Severe descending and sigmoid colonic diverticulosis without acute inflammatory change. 11. Status post right total hip arthroplasty. Electronically signed by: Dorethia Molt MD 03/08/2024 07:59 PM EST RP Workstation: [id number]    Scheduled Meds:  Chlorhexidine  Gluconate Cloth  6 each Topical Q0600   folic acid   1 mg Intravenous Daily   lidocaine   1 patch Transdermal Q24H   sodium chloride  flush  5 mL Intracatheter Q8H   Continuous Infusions:  lactated ringers  100 mL/hr at 03/10/24 0517     LOS: 9 days   Norval Bar, MD  [facility]  03/10/2024, 10:33 AM   "

## 2024-03-10 NOTE — Procedures (Signed)
 Interventional Radiology Procedure Note  Procedure:  1) Hepatic angiogram 2) Thoracic aortogram 3) Cholecystotomy tube exchange  Findings: Please refer to procedural dictation for full description.  Angiograms without evidence of active extravasation.  Cholecystotomy tube upsized to 12 Fr, to bag drainage.  Right CFA 5 Fr access, closed with Mynx.  Complications: None immediate  Estimated Blood Loss: < 5 ml  Recommendations: Strict 2 hour bedrest, flat. Continue to trend H/H. IR will follow.   Ester Sides, MD

## 2024-03-10 NOTE — Progress Notes (Addendum)
 " [facility] Progress Note    Assessment/ Plan:   AKI on CKD3a, oliguric -baseline Cr around 1.1-1.3 with AKI likely related to ATN - Has had some dehydration with attempted IV fluids but creatinine continued to rise despite hydration - No obvious uremic symptoms at this time; some nausea but may be related to the hypotension - He went dialysis on 12/23 and 12/24, even on 12/27.  Some urine output some monitor closely for recovery.   - s/p hepatic and thoracic angiogram + cholecystostomy tube exchange 12/28 which may delay recovery  Difficulty with in and out last night, today bladder scan but no return on in and out. Suspicious for blood clots unless the scan is reading the hemoperitoneum; may be worthwhile getting an official bladder scan to r/o large bladder voluyme with urology evaluation if needed.   -> plan on holding hd hopefully next few days and see if UOP incr with signs of renal recovery. Main issue at this time may be blood clots in the bladder.   Acute cholecystitis -per primary service. Surgery following -s/p choley drain 12/21 with IR, bloody drainage   Sepsis -secondary to gastroenteritis +/- cholecystitis - Antibiotics per primary.  Now much improved   Left renal mass -recommend MRI with and without contrast as outpatient. If here longer than expected then can be done prior to discharge, will need to see urology if mass is suspicious for RCC   HTN -BP soft, holding  anti-HTNs   Thrombocytopenia -work up per primary    Subjective:   Patient feels fatigued with intermittent nausea.  Noted bleeding around the drain site by RN. Large bladder scan volume but no return.   Objective:   BP 122/64 (BP Location: Left Arm)   Pulse 88   Temp (!) 97.5 F (36.4 C) (Oral)   Resp (!) 23   Ht 6' 1 (1.854 m)   Wt 92.2 kg   SpO2 93%   BMI 26.82 kg/m   Intake/Output Summary (Last 24 hours) at 03/10/2024 1709 Last data filed at 03/10/2024  1620 Gross per 24 hour  Intake 2219.37 ml  Output 1200 ml  Net 1019.37 ml   Weight change:   Physical Exam: Gen: Lying in bed in no distress CVS: Normal rate, no rub Resp: Bilateral chest rise with no increased work of breathing Abd: +choley drain, moderately distended Ext: no edema Neuro: awake, alert GU: no foley  Imaging: CT Angio Abd/Pel w/ and/or w/o Result Date: 03/08/2024 EXAM: CTA ABDOMEN AND PELVIS WITH AND WITHOUT CONTRAST 03/08/2024 07:09:03 PM TECHNIQUE: CTA images of the abdomen and pelvis without and with intravenous contrast. Three-dimensional MIP/volume rendered formations were performed. Automated exposure control, iterative reconstruction, and/or weight based adjustment of the mA/kV was utilized to reduce the radiation dose to as low as reasonably achievable. COMPARISON: 03/01/2024 CLINICAL HISTORY: Retroperitoneal bleed suspected. FINDINGS: VASCULATURE: AORTA: Extensive aortoiliac atherosclerotic calcification. No acute finding. No abdominal aortic aneurysm. No dissection. CELIAC TRUNK: No acute finding. No occlusion or significant stenosis. SUPERIOR MESENTERIC ARTERY: No acute finding. No occlusion or significant stenosis. RENAL ARTERIES: No acute finding. No occlusion or significant stenosis. ILIAC ARTERIES: Extensive aortoiliac atherosclerotic calcification. No acute finding. No occlusion or significant stenosis. LIVER: Interval transhepatic percutaneous cholecystostomy has been performed with decompression of the gallbladder. The retaining loop of the drainage catheter appears well positioned within the decompressed gallbladder lumen. There has developed with a sentinel clot seen within the right posterior perihepatic region extending into the subdiaphragmatic region and  inferiorly into Morison's pouch. There is, however, no active extravasation arising from the transhepatic puncture at this time. GALLBLADDER AND BILE DUCTS: Interval transhepatic percutaneous  cholecystostomy has been performed with decompression of the gallbladder. The retaining loop of the drainage catheter appears well positioned within the decompressed gallbladder lumen. No biliary ductal dilatation. SPLEEN: The spleen is unremarkable. PANCREAS: Infiltration surrounding the tail of the pancreas, however, appears new and is nonspecific. This may represent infiltrative hemorrhage related to fluid within the lesser sac though superimposed acute interstitial / edematous pancreatitis could appear similarly. The pancreatic duct does not dilated. There is normal enhancement of the pancreatic parenchyma. 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 The left adrenal gland demonstrates no acute abnormality. KIDNEYS, URETERS AND BLADDER: Marked right renal cortical atrophy. Simple cortical cyst within the left kidney for which no follow up imaging is recommended. No stones in the kidneys or ureters. No hydronephrosis. No perinephric or periureteral stranding. The bladder is partially obscured by streak artifact, however, the visualized portion is unremarkable. GI AND BOWEL: Hi attenuation fluid collection adjacent to the greater curvature of the stomach represents hemoperitoneum within the lesser sac. Severe descending and sigmoid colonic diverticulosis without superimposed acute inflammatory change. Stomach and duodenal sweep demonstrate no acute abnormality. There is no bowel obstruction. No abnormal bowel wall thickening or distension. REPRODUCTIVE: Reproductive organs are unremarkable. PERITONEUM AND RETROPERITONEUM: Hi attenuation fluid collection adjacent to the greater curvature of the stomach represents hemoperitoneum within the lesser sac. A sentinel clot seen within the right posterior perihepatic region extending into the subdiaphragmatic region and inferiorly into Morison's pouch. No free air. LUNG BASE: Small  bilateral pleural effusions are now seen at the lung bases with associated bibasilar compressive atelectasis. LYMPH NODES: No lymphadenopathy. BONES AND SOFT TISSUES: Status post right total hip arthroplasty. Osseous structures are age appropriate. No acute bone abnormality. No lytic or blastic bone lesion. No acute soft tissue abnormality. HEART AND MEDIASTINUM: New hypoattenuation of the cardiac blood pool in keeping with at least moderate anemia. Extensive coronary artery calcification. Calcification of the aortic valve leaflets. Mild cardiomegaly. IMPRESSION: 1. No active extravasation arising from the transhepatic puncture at this time. No active gastrointestinal hemorrhage. 2. Hemoperitoneum, including sentinel clot within the right posterior perihepatic region extending into the subdiaphragmatic region and Morison pouch, and hemoperitoneum within the lesser sac. Given the interval cholecystostomy, this is favored to represent intraperitoneal hemorrhage related to the transhepatic puncture. 3. Interval transhepatic percutaneous cholecystostomy with decompression of the gallbladder and drainage catheter appropriately positioned. 4. New hypoattenuation of the cardiac blood pool, consistent with at least moderate anemia. 5. Nonspecific infiltration surrounding the tail of the pancreas, possibly representing infiltrative hemorrhage or superimposed acute interstitial/edematous pancreatitis. Correlation with serum amylase and lipase levels would be helpful for confirmation. 6. Small bilateral pleural effusions with associated bibasilar compressive atelectasis. 7. Extensive coronary artery calcification. 8. Mild cardiomegaly. 9. Marked right renal cortical atrophy. 10. Severe descending and sigmoid colonic diverticulosis without acute inflammatory change. 11. Status post right total hip arthroplasty. Electronically signed by: Dorethia Molt MD 03/08/2024 07:59 PM EST RP Workstation: [id number]      Labs: BMET Recent Labs  Lab 03/04/24 0244 03/05/24 0345 03/06/24 0300 03/07/24 0500 03/08/24 1100 03/09/24 0452 03/10/24 0718  NA 137 136 136 138 138 137 135  K 4.0 4.0 3.4* 3.3* 4.0 4.0 4.4  CL 101 103 102 102 102 102 99  CO2 18*  14* 22 24 24 23 25   GLUCOSE 92 77 110* 105* 124* 92 102*  BUN 88* 96* 70* 50* 61* 66* 41*  CREATININE 7.41* 7.94* 6.23* 4.97* 6.17* 6.45* 4.70*  CALCIUM  8.4* 7.9* 7.7* 7.7* 7.6* 7.5* 7.4*  PHOS  --  5.7* 4.1 3.4 5.6*  --   --    CBC Recent Labs  Lab 03/07/24 0500 03/08/24 1058 03/09/24 0452 03/09/24 1319 03/10/24 0722 03/10/24 1539  WBC 8.1   < > 9.8 11.7* 13.8* 10.7*  NEUTROABS 6.3  --   --   --   --   --   HGB 11.0*   < > 6.9* 7.4* 6.0* 7.3*  HCT 32.3*   < > 19.9* 21.5* 17.6* 21.5*  MCV 85.9   < > 86.1 85.3 86.7 87.8  PLT 88*   < > 82* 86* 108* 93*   < > = values in this interval not displayed.    Medications:     Chlorhexidine  Gluconate Cloth  6 each Topical Q0600   folic acid   1 mg Intravenous Daily   lidocaine   1 patch Transdermal Q24H   sodium chloride  flush  5 mL Intracatheter Q8H      "

## 2024-03-11 ENCOUNTER — Inpatient Hospital Stay

## 2024-03-11 ENCOUNTER — Other Ambulatory Visit: Payer: Self-pay | Admitting: Student

## 2024-03-11 DIAGNOSIS — N179 Acute kidney failure, unspecified: Secondary | ICD-10-CM | POA: Diagnosis not present

## 2024-03-11 DIAGNOSIS — I493 Ventricular premature depolarization: Secondary | ICD-10-CM

## 2024-03-11 LAB — CBC
HCT: 20.7 % — ABNORMAL LOW (ref 39.0–52.0)
HCT: 21.6 % — ABNORMAL LOW (ref 39.0–52.0)
HCT: 25.1 % — ABNORMAL LOW (ref 39.0–52.0)
Hemoglobin: 7 g/dL — ABNORMAL LOW (ref 13.0–17.0)
Hemoglobin: 7.3 g/dL — ABNORMAL LOW (ref 13.0–17.0)
Hemoglobin: 8.3 g/dL — ABNORMAL LOW (ref 13.0–17.0)
MCH: 29.7 pg (ref 26.0–34.0)
MCH: 30.3 pg (ref 26.0–34.0)
MCH: 30.4 pg (ref 26.0–34.0)
MCHC: 33.1 g/dL (ref 30.0–36.0)
MCHC: 33.8 g/dL (ref 30.0–36.0)
MCHC: 33.8 g/dL (ref 30.0–36.0)
MCV: 87.8 fL (ref 80.0–100.0)
MCV: 90 fL (ref 80.0–100.0)
MCV: 91.6 fL (ref 80.0–100.0)
Platelets: 105 K/uL — ABNORMAL LOW (ref 150–400)
Platelets: 109 K/uL — ABNORMAL LOW (ref 150–400)
Platelets: 111 K/uL — ABNORMAL LOW (ref 150–400)
RBC: 2.3 MIL/uL — ABNORMAL LOW (ref 4.22–5.81)
RBC: 2.46 MIL/uL — ABNORMAL LOW (ref 4.22–5.81)
RBC: 2.74 MIL/uL — ABNORMAL LOW (ref 4.22–5.81)
RDW: 14.4 % (ref 11.5–15.5)
RDW: 14.6 % (ref 11.5–15.5)
RDW: 14.8 % (ref 11.5–15.5)
WBC: 10.4 K/uL (ref 4.0–10.5)
WBC: 10.8 K/uL — ABNORMAL HIGH (ref 4.0–10.5)
WBC: 13 K/uL — ABNORMAL HIGH (ref 4.0–10.5)
nRBC: 0 % (ref 0.0–0.2)
nRBC: 0 % (ref 0.0–0.2)
nRBC: 0 % (ref 0.0–0.2)

## 2024-03-11 LAB — PREPARE RBC (CROSSMATCH)

## 2024-03-11 MED ORDER — SODIUM CHLORIDE 0.9% IV SOLUTION: Freq: Once | INTRAVENOUS | Status: AC

## 2024-03-11 MED ORDER — HEPARIN SODIUM (PORCINE) 1000 UNIT/ML IJ SOLN
1000.0000 [IU] | Freq: Once | INTRAMUSCULAR | Status: AC
  Administered 2024-03-11: 2600 [IU] via INTRAVENOUS

## 2024-03-11 MED ORDER — HEPARIN SODIUM (PORCINE) 1000 UNIT/ML IJ SOLN
INTRAMUSCULAR | Status: AC
  Filled 2024-03-11: qty 3

## 2024-03-11 NOTE — Progress Notes (Unsigned)
 Enrolled for [employer] to mail a ZIO XT long term holter monitor to the patients address on file.   Dr. Jodelle Red to read.

## 2024-03-11 NOTE — Progress Notes (Signed)
 " PROGRESS NOTE    [name]  FMW:[id number] DOB: 29-Dec-1945 DOA: 03/01/2024 PCP: Kennyth Worth HERO, MD  Subjective: No acute events overnight. Seen and examined at bedside. Reports feeling better with some improvement in weakness. Reports making more urine than yesterday. Tolerating IR procedure well yesterday.  Denies nausea, vomiting, constipation.  Hospital Course:  78 year old male with PMHx of HTN and HLD who presented to the ED on 03/01/2024 with several days of nausea, vomiting, and diarrhea. Symptoms began on 12/14 after returning from church and eating a frozen [employer] chicken meal, followed about an hour later by chills/subjective fevers with rigors, then profuse emesis and subsequent multiple episodes of loose watery stools. Emesis progressed from food contents to watery, nonbloody. Stools watery without blood or mucus. He reports poor p.o. intake since Sunday and generalized, nonfocal abdominal discomfort. Also notes markedly decreased urine output over the past 2 days with dark orange urine, without gross hematuria. In the ED he was found to have AKI and hypokalemia, with CT abdomen findings concerning for acute cholecystitis. He was admitted to the hospital service for further management. Now s/p perc chole tube. Has temp HD cath and is getting HD per renal. Dispo pending renal recovery at this point.    Assessment and Plan:  Hypotension Hemoperitoneum Acute blood loss anemia Acute thrombocytopenia Folate deficiency History of HTN - low BP likely due to hemoperitoneum due to biliary drain placement, biliary output, and poor oral intake - status post hepatic and thoracic angiogram and cholecystostomy tube exchange 12/28  - Hgb 7 < 7.3, trough at 5.5, baseline 13-14  - platelets 109 < 86, baseline > 150 - folate 3.8 - Continue holding home Losartan  (also in setting of AKI) - hold Troprol-XL 25 mg daily - will get 1 unit pRBC today - cont IV folic acid  given severe  symptomatic anemia - transfuse for Hgb goal > 7 - transfuse for platelet goal > 50 given concerns of acute bleed - IR team following   AKI on CKD stage IIIa  Oliguria High anion gap metabolic acidosis - Likely prerenal in the setting of decreased PO intake and increased GI losses with concomitant Losartan  use. Nephrology suspecting ATN given no obstruction.  - Cr 4.7 < 6.45, peaked to 7.9 12/23 - now s/p temp HD cath  - dialysis as per nephro recs - nephrology following   Acute hypoxic respiratory failure  - Currently on 2 < 3L , not on O2 at baseline  - likely multifactorial in the setting of acute symptomatic anemia, atelectasis from abdominal distension, splinting from acute cholecystitis and biliary drain site pain - clinically hypovolemic given acute bleeding as elsewhere - Echo (12/22) showed LVEF 55-60% with no RWMA, mild concentric LVH, G1DD, normal RV function, no valvular abnormalities - CXR elevated R hemidiaphragm with R basilar opacity (atelectasis vs PNA) and L basilar atelectasis, rounded R paratracheal density (tortuous vessel vs adenopathy), recommended CT chest w/ contrast. No effusion or PTX.  - HD sessions as per nephro recs - treatment of acute blood loss anemia as elsewhere - continue to wean oxygen as tolerated  - nephrology following   Acute urinary retention - etiology unclear - Likely multifactorial in the setting of acute illness with decreased mobility, hemoperitoneum, oliguric AKI on CKD - large discrepancy in bladder scan readings and voided urine on straight caths - concern patient with ongoing hemoperitoneum that is making it hard accurately measure bladder volumes - having regular Bms - cont with intermittent bladder  scans if unable to void for 8 hours - straight cath as needed for bladder volume > 400    Sepsis Acute cholecystitis - Has RUQ pain on palpation with CT evidence of acute cholecystitis without CBD dilation - now s/p perc chole drain  12/21 then exchange 12/28  - culture growing e. Coli  - Finished 5 days of IV Zosyn  post-biliary drain placement on 12/26 - Needs follow up in general surgery office in 6 weeks to discuss interval cholecystectomy.  -  Needs follow up with IR to have cholangiogram before his appt.  - Watch for high biliary output. - General surgery following - IR team following   PVCs  NSVT - Tele alerted to multiple runs of VT on 12/21 during which patient was asymptomatic and HDS. - Patient with known history of high PVC burden. - Cardiology consulted on 12/21, determined NSVT with elevated burden of PVC worsened by ongoing acute illness, recommended echo and electrolyte replacement - Echo without concerning findings, as noted above - hold Toprol -XL 25 mg given hypotension as elsewhere  - hold amiodarone given hypotension and AKI on CKD - plan for zio monitor for 7 days to assess PVC burden, ordered by cardio on 12/29 - cardiology has signed off - last note 12/25 - will need to follow up with cardiology outpatient for non-invasive ischemic evaluation   Gastroenteritis - Patient presented with N/V and diarrhea which started after eating frozen meal - No further episodes of diarrhea, nausea, or vomiting.  Tolerating renal diet - GIPP and C diff PCR negative - Continue conservative management - Abx as elsewhere for acute cholecystitis - encourage oral intake/hydrations  - PRN Zofran   - PRN imodium    Lactic acidosis - resolved - likely due to hemoperitoneum, hypovolemic/hypotension - IV fluids as elsewhere - finished antibiotics course on 12/26 - trend lactate   UTI ruled out - Urine culture showed no growth - Abx as elsewhere for acute cholecystitis   Hypokalemia  Resolved with supplementation Monitor and replete electrolytes as needed   Left renal mass - CT abd/pel showed soft tissue mass on left kidney - Will need MRI with contrast per renal mass protocol as outpatient   Abnormal  CXR Right paratracheal density, tortuous vasculature vs adenopathy, needs CT with contrast when able - currently on hold with AKI   Transaminitis Direct Hyperbilirubinemia  - improving - Possibly related to acute illness in the setting of cholecystitis -  AST, ALT, and ALP normalized - T. Bili improving   HLD - Statin held due to transaminitis  DVT prophylaxis:   SCDs   Code Status: Full Code  Disposition Plan: TBD Reason for continuing need for hospitalization: severity of illness, will need PT re-eval   Objective: Vitals:   03/11/24 1258 03/11/24 1349 03/11/24 1402 03/11/24 1430  BP: 115/70 (!) 111/57 118/72 114/63  Pulse: 93 96 92 (!) 39  Resp: 20 (!) 24 (!) 22 (!) 21  Temp: 98.1 F (36.7 C) (!) 96.8 F (36 C)    TempSrc: Oral     SpO2:  97% 99% 97%  Weight:  94.2 kg    Height:        Intake/Output Summary (Last 24 hours) at 03/11/2024 1455 Last data filed at 03/11/2024 1300 Gross per 24 hour  Intake 1448.14 ml  Output 350 ml  Net 1098.14 ml   Filed Weights   03/09/24 1258 03/10/24 0300 03/11/24 1349  Weight: (S) 90.1 kg 92.2 kg 94.2 kg    Examination:  Physical Exam Vitals and nursing note reviewed.  Constitutional:      General: He is not in acute distress.    Appearance: He is ill-appearing.     Comments: Weak, frail  HENT:     Head: Normocephalic and atraumatic.  Cardiovascular:     Rate and Rhythm: Normal rate and regular rhythm.     Pulses: Normal pulses.     Heart sounds: Normal heart sounds.  Pulmonary:     Effort: Pulmonary effort is normal. No respiratory distress.     Breath sounds: Normal breath sounds. No wheezing.  Abdominal:     General: Bowel sounds are normal. There is no distension.     Palpations: Abdomen is soft.     Tenderness: There is no abdominal tenderness.  Neurological:     Mental Status: He is alert. Mental status is at baseline.     Data Reviewed: I have personally reviewed following labs and imaging  studies  CBC: Recent Labs  Lab 03/07/24 0500 03/08/24 1058 03/09/24 1319 03/10/24 0722 03/10/24 1539 03/10/24 2352 03/11/24 0548  WBC 8.1   < > 11.7* 13.8* 10.7* 10.8* 10.4  NEUTROABS 6.3  --   --   --   --   --   --   HGB 11.0*   < > 7.4* 6.0* 7.3* 7.3* 7.0*  HCT 32.3*   < > 21.5* 17.6* 21.5* 21.6* 20.7*  MCV 85.9   < > 85.3 86.7 87.8 87.8 90.0  PLT 88*   < > 86* 108* 93* 105* 109*   < > = values in this interval not displayed.   Basic Metabolic Panel: Recent Labs  Lab 03/05/24 0345 03/06/24 0300 03/07/24 0500 03/08/24 1100 03/09/24 0452 03/10/24 0718  NA 136 136 138 138 137 135  K 4.0 3.4* 3.3* 4.0 4.0 4.4  CL 103 102 102 102 102 99  CO2 14* 22 24 24 23 25   GLUCOSE 77 110* 105* 124* 92 102*  BUN 96* 70* 50* 61* 66* 41*  CREATININE 7.94* 6.23* 4.97* 6.17* 6.45* 4.70*  CALCIUM  7.9* 7.7* 7.7* 7.6* 7.5* 7.4*  MG  --   --  1.8  --   --   --   PHOS 5.7* 4.1 3.4 5.6*  --   --    GFR: Estimated Creatinine Clearance: 15.1 mL/min (A) (by C-G formula based on SCr of 4.7 mg/dL (H)). Liver Function Tests: Recent Labs  Lab 03/05/24 0344 03/05/24 0345 03/06/24 0300 03/07/24 0500 03/08/24 1058 03/08/24 1100 03/09/24 0452 03/10/24 0718  AST 25  --  22  --  43*  --  40 43*  ALT 20  --  16  --  24  --  22 24  ALKPHOS 87  --  78  --  54  --  56 65  BILITOT 1.3*  --  1.3*  --  0.9  --  1.0 1.0  PROT 5.8*  --  5.7*  --  5.0*  --  5.2* 5.2*  ALBUMIN 2.5*   < > 2.6*  2.6* 2.7* 2.6* 2.6* 2.6* 2.7*   < > = values in this interval not displayed.   Recent Labs  Lab 03/09/24 0452  LIPASE 100*   No results for input(s): AMMONIA in the last 168 hours. Coagulation Profile: No results for input(s): INR, PROTIME in the last 168 hours. Cardiac Enzymes: No results for input(s): CKTOTAL, CKMB, CKMBINDEX, TROPONINI in the last 168 hours. ProBNP, BNP (last 5 results) Recent Labs  03/04/24 1129  PROBNP 18,836.0*   HbA1C: No results for input(s): HGBA1C in  the last 72 hours. CBG: Recent Labs  Lab 03/09/24 0622 03/09/24 1600  GLUCAP 95 103*   Lipid Profile: No results for input(s): CHOL, HDL, LDLCALC, TRIG, CHOLHDL, LDLDIRECT in the last 72 hours. Thyroid  Function Tests: No results for input(s): TSH, T4TOTAL, FREET4, T3FREE, THYROIDAB in the last 72 hours. Anemia Panel: Recent Labs    03/10/24 0718  VITAMINB12 527  FOLATE 3.8*  FERRITIN 386*  TIBC 251  IRON 53   Sepsis Labs: Recent Labs  Lab 03/07/24 1733 03/07/24 1946 03/08/24 1058  LATICACIDVEN 3.8* 2.5* 1.3    Recent Results (from the past 240 hours)  Resp panel by RT-PCR (RSV, Flu A&B, Covid) Anterior Nasal Swab     Status: None   Collection Time: 03/01/24  3:08 PM   Specimen: Anterior Nasal Swab  Result Value Ref Range Status   SARS Coronavirus 2 by RT PCR NEGATIVE NEGATIVE Final    Comment: (NOTE) SARS-CoV-2 target nucleic acids are NOT DETECTED.  The SARS-CoV-2 RNA is generally detectable in upper respiratory specimens during the acute phase of infection. The lowest concentration of SARS-CoV-2 viral copies this assay can detect is 138 copies/mL. A negative result does not preclude SARS-Cov-2 infection and should not be used as the sole basis for treatment or other patient management decisions. A negative result may occur with  improper specimen collection/handling, submission of specimen other than nasopharyngeal swab, presence of viral mutation(s) within the areas targeted by this assay, and inadequate number of viral copies(<138 copies/mL). A negative result must be combined with clinical observations, patient history, and epidemiological information. The expected result is Negative.  Fact Sheet for Patients:  bloggercourse.com  Fact Sheet for Healthcare Providers:  seriousbroker.it  This test is no t yet approved or cleared by the [place]  FDA and  has been authorized for  detection and/or diagnosis of SARS-CoV-2 by FDA under an Emergency Use Authorization (EUA). This EUA will remain  in effect (meaning this test can be used) for the duration of the COVID-19 declaration under Section 564(b)(1) of the Act, 21 U.S.C.section 360bbb-3(b)(1), unless the authorization is terminated  or revoked sooner.       Influenza A by PCR NEGATIVE NEGATIVE Final   Influenza B by PCR NEGATIVE NEGATIVE Final    Comment: (NOTE) The Xpert Xpress SARS-CoV-2/FLU/RSV plus assay is intended as an aid in the diagnosis of influenza from Nasopharyngeal swab specimens and should not be used as a sole basis for treatment. Nasal washings and aspirates are unacceptable for Xpert Xpress SARS-CoV-2/FLU/RSV testing.  Fact Sheet for Patients: bloggercourse.com  Fact Sheet for Healthcare Providers: seriousbroker.it  This test is not yet approved or cleared by the [place]  FDA and has been authorized for detection and/or diagnosis of SARS-CoV-2 by FDA under an Emergency Use Authorization (EUA). This EUA will remain in effect (meaning this test can be used) for the duration of the COVID-19 declaration under Section 564(b)(1) of the Act, 21 U.S.C. section 360bbb-3(b)(1), unless the authorization is terminated or revoked.     Resp Syncytial Virus by PCR NEGATIVE NEGATIVE Final    Comment: (NOTE) Fact Sheet for Patients: bloggercourse.com  Fact Sheet for Healthcare Providers: seriousbroker.it  This test is not yet approved or cleared by the [place]  FDA and has been authorized for detection and/or diagnosis of SARS-CoV-2 by FDA under an Emergency Use Authorization (EUA). This EUA will remain in effect (meaning this test can  be used) for the duration of the COVID-19 declaration under Section 564(b)(1) of the Act, 21 U.S.C. section 360bbb-3(b)(1), unless the authorization is  terminated or revoked.  Performed at [facility], [address] [place], [place] [address]   Blood culture (routine x 2)     Status: None   Collection Time: 03/01/24  5:25 PM   Specimen: BLOOD  Result Value Ref Range Status   Specimen Description   Final    BLOOD RIGHT ANTECUBITAL Performed at [facility], [address] [place], [place] [address]    Special Requests   Final    BOTTLES DRAWN AEROBIC AND ANAEROBIC Blood Culture adequate volume Performed at [facility], [address] [place], [place] [address]    Culture   Final    NO GROWTH 5 DAYS Performed at [facility], [address] [place], [place] [address]    Report Status 03/06/2024 FINAL  Final  Blood culture (routine x 2)     Status: None   Collection Time: 03/01/24  5:41 PM   Specimen: BLOOD RIGHT FOREARM  Result Value Ref Range Status   Specimen Description   Final    BLOOD RIGHT FOREARM Performed at [facility], [address] [place], [place] [address]    Special Requests   Final    BOTTLES DRAWN AEROBIC AND ANAEROBIC Blood Culture adequate volume Performed at [facility], [address] [place], [place] [address]    Culture   Final    NO GROWTH 5 DAYS Performed at [facility], [address], [place], [place] [address]    Report Status 03/06/2024 FINAL  Final  Gastrointestinal Panel by PCR , Stool     Status: None   Collection Time: 03/01/24 10:05 PM   Specimen: Stool  Result Value Ref Range Status   Campylobacter species NOT DETECTED NOT DETECTED Final   Plesimonas shigelloides NOT DETECTED NOT DETECTED Final   Salmonella species NOT DETECTED NOT DETECTED Final   Yersinia enterocolitica NOT DETECTED NOT DETECTED Final   Vibrio species NOT DETECTED NOT DETECTED Final   Vibrio cholerae NOT DETECTED NOT DETECTED Final   Enteroaggregative E coli (EAEC) NOT DETECTED NOT DETECTED Final   Enteropathogenic E coli (EPEC) NOT  DETECTED NOT DETECTED Final   Enterotoxigenic E coli (ETEC) NOT DETECTED NOT DETECTED Final   Shiga like toxin producing E coli (STEC) NOT DETECTED NOT DETECTED Final   Shigella/Enteroinvasive E coli (EIEC) NOT DETECTED NOT DETECTED Final   Cryptosporidium NOT DETECTED NOT DETECTED Final   Cyclospora cayetanensis NOT DETECTED NOT DETECTED Final   Entamoeba histolytica NOT DETECTED NOT DETECTED Final   Giardia lamblia NOT DETECTED NOT DETECTED Final   Adenovirus F40/41 NOT DETECTED NOT DETECTED Final   Astrovirus NOT DETECTED NOT DETECTED Final   Norovirus GI/GII NOT DETECTED NOT DETECTED Final   Rotavirus A NOT DETECTED NOT DETECTED Final   Sapovirus (I, II, IV, and V) NOT DETECTED NOT DETECTED Final    Comment: Performed at [facility], [address] [place], [place] [address]  C Difficile Quick Screen w PCR reflex     Status: None   Collection Time: 03/01/24 10:05 PM   Specimen: Stool  Result Value Ref Range Status   C Diff antigen NEGATIVE NEGATIVE Final   C Diff toxin NEGATIVE NEGATIVE Final   C Diff interpretation No C. difficile detected.  Final  Comment: Performed at [facility], [address] [place], [place] [address]  Urine Culture (for pregnant, neutropenic or urologic patients or patients with an indwelling urinary catheter)     Status: None   Collection Time: 03/02/24 10:11 PM   Specimen: Urine, Clean Catch  Result Value Ref Range Status   Specimen Description URINE, CLEAN CATCH  Final   Special Requests NONE  Final   Culture   Final    NO GROWTH Performed at [facility], [address] [place], [place] [address]    Report Status 03/04/2024 FINAL  Final  Aerobic/Anaerobic Culture w Gram Stain (surgical/deep wound)     Status: None   Collection Time: 03/03/24  8:26 AM   Specimen: Abscess  Result Value Ref Range Status   Specimen Description ABSCESS  Final   Special Requests GALLBLADDER  Final   Gram Stain   Final    ABUNDANT WBC  PRESENT, PREDOMINANTLY PMN RARE GRAM NEGATIVE RODS    Culture   Final    ABUNDANT ESCHERICHIA COLI NO ANAEROBES ISOLATED Performed at [facility], [address] [place], [place] [address]    Report Status 03/08/2024 FINAL  Final   Organism ID, Bacteria ESCHERICHIA COLI  Final      Susceptibility   Escherichia coli - MIC*    AMPICILLIN >=32 RESISTANT Resistant     CEFAZOLIN  (NON-URINE) 2 SENSITIVE Sensitive     CEFEPIME <=0.12 SENSITIVE Sensitive     ERTAPENEM <=0.12 SENSITIVE Sensitive     CEFTRIAXONE <=0.25 SENSITIVE Sensitive     CIPROFLOXACIN 0.5 INTERMEDIATE Intermediate     GENTAMICIN <=1 SENSITIVE Sensitive     MEROPENEM <=0.25 SENSITIVE Sensitive     TRIMETH/SULFA <=20 SENSITIVE Sensitive     AMPICILLIN/SULBACTAM 8 SENSITIVE Sensitive     PIP/TAZO Value in next row Sensitive      <=4 SENSITIVEThis is a modified FDA-approved test that has been validated and its performance characteristics determined by the reporting laboratory.  This laboratory is certified under the Clinical Laboratory Improvement Amendments CLIA as qualified to perform high complexity clinical laboratory testing.    * ABUNDANT ESCHERICHIA COLI     Radiology Studies: IR Angiogram Visceral Selective Result Date: 03/10/2024 INDICATION: 77 year old male with history of intraperitoneal hemorrhage status post percutaneous cholecystostomy tube placement with persistent anemia despite transfusion. EXAM: SELECTIVE VISCERAL ARTERIOGRAPHY; THORACIC AORTOGRAPHY; ADDITIONAL ARTERIOGRAPHY; IR ULTRASOUND GUIDANCE VASC ACCESS RIGHT; IR EXCHANGE BILARY DRAIN MEDICATIONS: None. ANESTHESIA/SEDATION: Moderate (conscious) sedation was employed during this procedure. A total of Versed  2 mg and Fentanyl  100 mcg was administered intravenously. Moderate Sedation Time: 55 minutes. The patient's level of consciousness and vital signs were monitored continuously by radiology nursing throughout the procedure under my direct  supervision. CONTRAST:  80mL OMNIPAQUE  IOHEXOL  300 MG/ML SOLN, 20mL OMNIPAQUE  IOHEXOL  300 MG/ML SOLN, <See Chart> OMNIPAQUE  IOHEXOL  300 MG/ML SOLN, 70mL OMNIPAQUE  IOHEXOL  300 MG/ML SOLN FLUOROSCOPY: Radiation Exposure Index (as provided by the fluoroscopic device): 1,170 mGy reference air Kerma COMPLICATIONS: None immediate. PROCEDURE: Informed consent was obtained from the patient following explanation of the procedure, risks, benefits and alternatives. The patient understands, agrees and consents for the procedure. All questions were addressed. A time out was performed prior to the initiation of the procedure. Maximal barrier sterile technique utilized including caps, mask, sterile gowns, sterile gloves, large sterile drape, hand hygiene, and Betadine  prep. Preprocedure ultrasound evaluation demonstrated patency of the right common femoral artery. The procedure was planned. Subdermal Local anesthesia was administered  at the planned needle entry site with 1% lidocaine . A small skin nick was made. Under direct ultrasound visualization, a 20 gauge micropuncture needle was directed into the right common femoral artery. A permanent ultrasound image was captured and stored in the record. A J wire was then directed to the proximal abdominal aorta over which the micropuncture sheath was exchanged for a 5 French vascular sheath. A C2 catheter was then introduced and used to select the celiac trunk. Dedicated celiac angiogram demonstrated patency of the proximal branches including the splenic, left gastric, and common hepatic arteries. The common hepatic artery was then selected. Dedicated angiogram from this location demonstrated patency of the common, gastroduodenal, proper, left, and right hepatic arteries without evidence of active extravasation. Specifically, there is no evidence of extravasation from the cystic artery or about the hepatic capsule. Attention was then turned toward the indwelling percutaneous  cholecystostomy tube. The surrounding skin was anesthetized locally with 1% lidocaine . The retention suture was cut. The external portion of the drain was cut to release inter pigtail. An Amplatz wire was inserted and coiled within the gallbladder lumen. The drain was removed over the wire. Upon removal, there was immediate and voluminous dark, venous appearing hemorrhage from the skin entry site. Repeat angiogram from the proper hepatic artery demonstrated similar findings to previous with no evidence of arterial extravasation about the established percutaneous cholecystostomy tract. A 12 French pigtail drainage catheter was then prepared and quickly exchanged over the wire with the pigtail portion then formed in the gallbladder lumen. Contrast was injected via the drain which demonstrated multiple mobile filling defects compatible with cholelithiasis. There is faint opacification of the peripheral cystic duct. The common bile duct was not visualized. A 2.4 French Progreat microcatheter and fathom 14 microwire were then used to select the cystic artery. Dedicated angiogram from the cystic artery demonstrated no evidence of active extravasation into the gallbladder lumen. Attention was then turned toward investigating the right T9 intercostal artery. The indwelling catheter was exchanged for a flush catheter was was directed to the midthoracic aorta and thoracic angiogram was obtained. Thoracic angiogram was significant for patency and tortuosity of the visualized thoracoabdominal aorta without evidence of stenosis. The T9-T10 intercostal arteries were visualized without evidence of active extravasation about the catheter transgression site. A Mickelson catheter was directed to the distal thoracic aorta and attempted to be used to cannulate the right T9 intercostal artery which was unsuccessful. Next, a C2 catheter was attempted at this location which was partially successful at cannulating the ostium, however  established access was unable to be secured despite multiple attempts. The procedure was then aborted. The catheters were removed. The right common femoral artery access was closed with a 5 French Mynx device was deployed successfully without complication. Peripheral pulses were unchanged. The patient tolerated the procedure well was transferred back to floor in good condition. IMPRESSION: 1. Technically successful percutaneous cholecystostomy tube exchange and upsize to 12 French. Cholecystogram is significant for cholelithiasis and persistent cystic duct obstruction. 2. Upon removal of the indwelling 10 French percutaneous cholecystostomy tube, there was brisk venous appearing bleeding from the skin entry site. This terminated after up sizing. 3. Hepatic and thoracic angiography demonstrate no evidence of active extravasation about the gallbladder, hepatic capsule, or catheter entry site about the right mid axillary thorax. Ester Sides, MD [facility] [place] [facility] Radiology Electronically Signed   By: Ester Sides M.D.   On: 03/10/2024 19:48   IR Aorta/Thoracic Result Date: 03/10/2024 INDICATION:  78 year old male with history of intraperitoneal hemorrhage status post percutaneous cholecystostomy tube placement with persistent anemia despite transfusion. EXAM: SELECTIVE VISCERAL ARTERIOGRAPHY; THORACIC AORTOGRAPHY; ADDITIONAL ARTERIOGRAPHY; IR ULTRASOUND GUIDANCE VASC ACCESS RIGHT; IR EXCHANGE BILARY DRAIN MEDICATIONS: None. ANESTHESIA/SEDATION: Moderate (conscious) sedation was employed during this procedure. A total of Versed  2 mg and Fentanyl  100 mcg was administered intravenously. Moderate Sedation Time: 55 minutes. The patient's level of consciousness and vital signs were monitored continuously by radiology nursing throughout the procedure under my direct supervision. CONTRAST:  80mL OMNIPAQUE  IOHEXOL  300 MG/ML SOLN, 20mL OMNIPAQUE  IOHEXOL  300 MG/ML SOLN, <See Chart>  OMNIPAQUE  IOHEXOL  300 MG/ML SOLN, 70mL OMNIPAQUE  IOHEXOL  300 MG/ML SOLN FLUOROSCOPY: Radiation Exposure Index (as provided by the fluoroscopic device): 1,170 mGy reference air Kerma COMPLICATIONS: None immediate. PROCEDURE: Informed consent was obtained from the patient following explanation of the procedure, risks, benefits and alternatives. The patient understands, agrees and consents for the procedure. All questions were addressed. A time out was performed prior to the initiation of the procedure. Maximal barrier sterile technique utilized including caps, mask, sterile gowns, sterile gloves, large sterile drape, hand hygiene, and Betadine  prep. Preprocedure ultrasound evaluation demonstrated patency of the right common femoral artery. The procedure was planned. Subdermal Local anesthesia was administered at the planned needle entry site with 1% lidocaine . A small skin nick was made. Under direct ultrasound visualization, a 20 gauge micropuncture needle was directed into the right common femoral artery. A permanent ultrasound image was captured and stored in the record. A J wire was then directed to the proximal abdominal aorta over which the micropuncture sheath was exchanged for a 5 French vascular sheath. A C2 catheter was then introduced and used to select the celiac trunk. Dedicated celiac angiogram demonstrated patency of the proximal branches including the splenic, left gastric, and common hepatic arteries. The common hepatic artery was then selected. Dedicated angiogram from this location demonstrated patency of the common, gastroduodenal, proper, left, and right hepatic arteries without evidence of active extravasation. Specifically, there is no evidence of extravasation from the cystic artery or about the hepatic capsule. Attention was then turned toward the indwelling percutaneous cholecystostomy tube. The surrounding skin was anesthetized locally with 1% lidocaine . The retention suture was cut. The  external portion of the drain was cut to release inter pigtail. An Amplatz wire was inserted and coiled within the gallbladder lumen. The drain was removed over the wire. Upon removal, there was immediate and voluminous dark, venous appearing hemorrhage from the skin entry site. Repeat angiogram from the proper hepatic artery demonstrated similar findings to previous with no evidence of arterial extravasation about the established percutaneous cholecystostomy tract. A 12 French pigtail drainage catheter was then prepared and quickly exchanged over the wire with the pigtail portion then formed in the gallbladder lumen. Contrast was injected via the drain which demonstrated multiple mobile filling defects compatible with cholelithiasis. There is faint opacification of the peripheral cystic duct. The common bile duct was not visualized. A 2.4 French Progreat microcatheter and fathom 14 microwire were then used to select the cystic artery. Dedicated angiogram from the cystic artery demonstrated no evidence of active extravasation into the gallbladder lumen. Attention was then turned toward investigating the right T9 intercostal artery. The indwelling catheter was exchanged for a flush catheter was was directed to the midthoracic aorta and thoracic angiogram was obtained. Thoracic angiogram was significant for patency and tortuosity of the visualized thoracoabdominal aorta without evidence of stenosis. The T9-T10 intercostal arteries were visualized without evidence of active  extravasation about the catheter transgression site. A Mickelson catheter was directed to the distal thoracic aorta and attempted to be used to cannulate the right T9 intercostal artery which was unsuccessful. Next, a C2 catheter was attempted at this location which was partially successful at cannulating the ostium, however established access was unable to be secured despite multiple attempts. The procedure was then aborted. The catheters were  removed. The right common femoral artery access was closed with a 5 French Mynx device was deployed successfully without complication. Peripheral pulses were unchanged. The patient tolerated the procedure well was transferred back to floor in good condition. IMPRESSION: 1. Technically successful percutaneous cholecystostomy tube exchange and upsize to 12 French. Cholecystogram is significant for cholelithiasis and persistent cystic duct obstruction. 2. Upon removal of the indwelling 10 French percutaneous cholecystostomy tube, there was brisk venous appearing bleeding from the skin entry site. This terminated after up sizing. 3. Hepatic and thoracic angiography demonstrate no evidence of active extravasation about the gallbladder, hepatic capsule, or catheter entry site about the right mid axillary thorax. Ester Sides, MD [facility] Radiology Electronically Signed   By: Ester Sides M.D.   On: 03/10/2024 19:48   IR US  Guide Vasc Access Right Result Date: 03/10/2024 INDICATION: 78 year old male with history of intraperitoneal hemorrhage status post percutaneous cholecystostomy tube placement with persistent anemia despite transfusion. EXAM: SELECTIVE VISCERAL ARTERIOGRAPHY; THORACIC AORTOGRAPHY; ADDITIONAL ARTERIOGRAPHY; IR ULTRASOUND GUIDANCE VASC ACCESS RIGHT; IR EXCHANGE BILARY DRAIN MEDICATIONS: None. ANESTHESIA/SEDATION: Moderate (conscious) sedation was employed during this procedure. A total of Versed  2 mg and Fentanyl  100 mcg was administered intravenously. Moderate Sedation Time: 55 minutes. The patient's level of consciousness and vital signs were monitored continuously by radiology nursing throughout the procedure under my direct supervision. CONTRAST:  80mL OMNIPAQUE  IOHEXOL  300 MG/ML SOLN, 20mL OMNIPAQUE  IOHEXOL  300 MG/ML SOLN, <See Chart> OMNIPAQUE  IOHEXOL  300 MG/ML SOLN, 70mL OMNIPAQUE  IOHEXOL  300 MG/ML SOLN FLUOROSCOPY: Radiation Exposure Index  (as provided by the fluoroscopic device): 1,170 mGy reference air Kerma COMPLICATIONS: None immediate. PROCEDURE: Informed consent was obtained from the patient following explanation of the procedure, risks, benefits and alternatives. The patient understands, agrees and consents for the procedure. All questions were addressed. A time out was performed prior to the initiation of the procedure. Maximal barrier sterile technique utilized including caps, mask, sterile gowns, sterile gloves, large sterile drape, hand hygiene, and Betadine  prep. Preprocedure ultrasound evaluation demonstrated patency of the right common femoral artery. The procedure was planned. Subdermal Local anesthesia was administered at the planned needle entry site with 1% lidocaine . A small skin nick was made. Under direct ultrasound visualization, a 20 gauge micropuncture needle was directed into the right common femoral artery. A permanent ultrasound image was captured and stored in the record. A J wire was then directed to the proximal abdominal aorta over which the micropuncture sheath was exchanged for a 5 French vascular sheath. A C2 catheter was then introduced and used to select the celiac trunk. Dedicated celiac angiogram demonstrated patency of the proximal branches including the splenic, left gastric, and common hepatic arteries. The common hepatic artery was then selected. Dedicated angiogram from this location demonstrated patency of the common, gastroduodenal, proper, left, and right hepatic arteries without evidence of active extravasation. Specifically, there is no evidence of extravasation from the cystic artery or about the hepatic capsule. Attention was then turned toward the indwelling percutaneous cholecystostomy tube. The surrounding skin was anesthetized locally with 1% lidocaine . The retention suture was cut. The  external portion of the drain was cut to release inter pigtail. An Amplatz wire was inserted and coiled within  the gallbladder lumen. The drain was removed over the wire. Upon removal, there was immediate and voluminous dark, venous appearing hemorrhage from the skin entry site. Repeat angiogram from the proper hepatic artery demonstrated similar findings to previous with no evidence of arterial extravasation about the established percutaneous cholecystostomy tract. A 12 French pigtail drainage catheter was then prepared and quickly exchanged over the wire with the pigtail portion then formed in the gallbladder lumen. Contrast was injected via the drain which demonstrated multiple mobile filling defects compatible with cholelithiasis. There is faint opacification of the peripheral cystic duct. The common bile duct was not visualized. A 2.4 French Progreat microcatheter and fathom 14 microwire were then used to select the cystic artery. Dedicated angiogram from the cystic artery demonstrated no evidence of active extravasation into the gallbladder lumen. Attention was then turned toward investigating the right T9 intercostal artery. The indwelling catheter was exchanged for a flush catheter was was directed to the midthoracic aorta and thoracic angiogram was obtained. Thoracic angiogram was significant for patency and tortuosity of the visualized thoracoabdominal aorta without evidence of stenosis. The T9-T10 intercostal arteries were visualized without evidence of active extravasation about the catheter transgression site. A Mickelson catheter was directed to the distal thoracic aorta and attempted to be used to cannulate the right T9 intercostal artery which was unsuccessful. Next, a C2 catheter was attempted at this location which was partially successful at cannulating the ostium, however established access was unable to be secured despite multiple attempts. The procedure was then aborted. The catheters were removed. The right common femoral artery access was closed with a 5 French Mynx device was deployed successfully  without complication. Peripheral pulses were unchanged. The patient tolerated the procedure well was transferred back to floor in good condition. IMPRESSION: 1. Technically successful percutaneous cholecystostomy tube exchange and upsize to 12 French. Cholecystogram is significant for cholelithiasis and persistent cystic duct obstruction. 2. Upon removal of the indwelling 10 French percutaneous cholecystostomy tube, there was brisk venous appearing bleeding from the skin entry site. This terminated after up sizing. 3. Hepatic and thoracic angiography demonstrate no evidence of active extravasation about the gallbladder, hepatic capsule, or catheter entry site about the right mid axillary thorax. Ester Sides, MD [facility] Radiology Electronically Signed   By: Ester Sides M.D.   On: 03/10/2024 19:48   IR Angiogram Selective Each Additional Vessel Result Date: 03/10/2024 INDICATION: 78 year old male with history of intraperitoneal hemorrhage status post percutaneous cholecystostomy tube placement with persistent anemia despite transfusion. EXAM: SELECTIVE VISCERAL ARTERIOGRAPHY; THORACIC AORTOGRAPHY; ADDITIONAL ARTERIOGRAPHY; IR ULTRASOUND GUIDANCE VASC ACCESS RIGHT; IR EXCHANGE BILARY DRAIN MEDICATIONS: None. ANESTHESIA/SEDATION: Moderate (conscious) sedation was employed during this procedure. A total of Versed  2 mg and Fentanyl  100 mcg was administered intravenously. Moderate Sedation Time: 55 minutes. The patient's level of consciousness and vital signs were monitored continuously by radiology nursing throughout the procedure under my direct supervision. CONTRAST:  80mL OMNIPAQUE  IOHEXOL  300 MG/ML SOLN, 20mL OMNIPAQUE  IOHEXOL  300 MG/ML SOLN, <See Chart> OMNIPAQUE  IOHEXOL  300 MG/ML SOLN, 70mL OMNIPAQUE  IOHEXOL  300 MG/ML SOLN FLUOROSCOPY: Radiation Exposure Index (as provided by the fluoroscopic device): 1,170 mGy reference air Kerma COMPLICATIONS: None  immediate. PROCEDURE: Informed consent was obtained from the patient following explanation of the procedure, risks, benefits and alternatives. The patient understands, agrees and consents for the procedure. All questions were addressed. A time  out was performed prior to the initiation of the procedure. Maximal barrier sterile technique utilized including caps, mask, sterile gowns, sterile gloves, large sterile drape, hand hygiene, and Betadine  prep. Preprocedure ultrasound evaluation demonstrated patency of the right common femoral artery. The procedure was planned. Subdermal Local anesthesia was administered at the planned needle entry site with 1% lidocaine . A small skin nick was made. Under direct ultrasound visualization, a 20 gauge micropuncture needle was directed into the right common femoral artery. A permanent ultrasound image was captured and stored in the record. A J wire was then directed to the proximal abdominal aorta over which the micropuncture sheath was exchanged for a 5 French vascular sheath. A C2 catheter was then introduced and used to select the celiac trunk. Dedicated celiac angiogram demonstrated patency of the proximal branches including the splenic, left gastric, and common hepatic arteries. The common hepatic artery was then selected. Dedicated angiogram from this location demonstrated patency of the common, gastroduodenal, proper, left, and right hepatic arteries without evidence of active extravasation. Specifically, there is no evidence of extravasation from the cystic artery or about the hepatic capsule. Attention was then turned toward the indwelling percutaneous cholecystostomy tube. The surrounding skin was anesthetized locally with 1% lidocaine . The retention suture was cut. The external portion of the drain was cut to release inter pigtail. An Amplatz wire was inserted and coiled within the gallbladder lumen. The drain was removed over the wire. Upon removal, there was immediate  and voluminous dark, venous appearing hemorrhage from the skin entry site. Repeat angiogram from the proper hepatic artery demonstrated similar findings to previous with no evidence of arterial extravasation about the established percutaneous cholecystostomy tract. A 12 French pigtail drainage catheter was then prepared and quickly exchanged over the wire with the pigtail portion then formed in the gallbladder lumen. Contrast was injected via the drain which demonstrated multiple mobile filling defects compatible with cholelithiasis. There is faint opacification of the peripheral cystic duct. The common bile duct was not visualized. A 2.4 French Progreat microcatheter and fathom 14 microwire were then used to select the cystic artery. Dedicated angiogram from the cystic artery demonstrated no evidence of active extravasation into the gallbladder lumen. Attention was then turned toward investigating the right T9 intercostal artery. The indwelling catheter was exchanged for a flush catheter was was directed to the midthoracic aorta and thoracic angiogram was obtained. Thoracic angiogram was significant for patency and tortuosity of the visualized thoracoabdominal aorta without evidence of stenosis. The T9-T10 intercostal arteries were visualized without evidence of active extravasation about the catheter transgression site. A Mickelson catheter was directed to the distal thoracic aorta and attempted to be used to cannulate the right T9 intercostal artery which was unsuccessful. Next, a C2 catheter was attempted at this location which was partially successful at cannulating the ostium, however established access was unable to be secured despite multiple attempts. The procedure was then aborted. The catheters were removed. The right common femoral artery access was closed with a 5 French Mynx device was deployed successfully without complication. Peripheral pulses were unchanged. The patient tolerated the procedure well  was transferred back to floor in good condition. IMPRESSION: 1. Technically successful percutaneous cholecystostomy tube exchange and upsize to 12 French. Cholecystogram is significant for cholelithiasis and persistent cystic duct obstruction. 2. Upon removal of the indwelling 10 French percutaneous cholecystostomy tube, there was brisk venous appearing bleeding from the skin entry site. This terminated after up sizing. 3. Hepatic and thoracic angiography demonstrate no evidence  of active extravasation about the gallbladder, hepatic capsule, or catheter entry site about the right mid axillary thorax. Ester Sides, MD [facility] Radiology Electronically Signed   By: Ester Sides M.D.   On: 03/10/2024 19:48   IR EXCHANGE BILIARY DRAIN Result Date: 03/10/2024 INDICATION: 78 year old male with history of intraperitoneal hemorrhage status post percutaneous cholecystostomy tube placement with persistent anemia despite transfusion. EXAM: SELECTIVE VISCERAL ARTERIOGRAPHY; THORACIC AORTOGRAPHY; ADDITIONAL ARTERIOGRAPHY; IR ULTRASOUND GUIDANCE VASC ACCESS RIGHT; IR EXCHANGE BILARY DRAIN MEDICATIONS: None. ANESTHESIA/SEDATION: Moderate (conscious) sedation was employed during this procedure. A total of Versed  2 mg and Fentanyl  100 mcg was administered intravenously. Moderate Sedation Time: 55 minutes. The patient's level of consciousness and vital signs were monitored continuously by radiology nursing throughout the procedure under my direct supervision. CONTRAST:  80mL OMNIPAQUE  IOHEXOL  300 MG/ML SOLN, 20mL OMNIPAQUE  IOHEXOL  300 MG/ML SOLN, <See Chart> OMNIPAQUE  IOHEXOL  300 MG/ML SOLN, 70mL OMNIPAQUE  IOHEXOL  300 MG/ML SOLN FLUOROSCOPY: Radiation Exposure Index (as provided by the fluoroscopic device): 1,170 mGy reference air Kerma COMPLICATIONS: None immediate. PROCEDURE: Informed consent was obtained from the patient following explanation of the procedure, risks,  benefits and alternatives. The patient understands, agrees and consents for the procedure. All questions were addressed. A time out was performed prior to the initiation of the procedure. Maximal barrier sterile technique utilized including caps, mask, sterile gowns, sterile gloves, large sterile drape, hand hygiene, and Betadine  prep. Preprocedure ultrasound evaluation demonstrated patency of the right common femoral artery. The procedure was planned. Subdermal Local anesthesia was administered at the planned needle entry site with 1% lidocaine . A small skin nick was made. Under direct ultrasound visualization, a 20 gauge micropuncture needle was directed into the right common femoral artery. A permanent ultrasound image was captured and stored in the record. A J wire was then directed to the proximal abdominal aorta over which the micropuncture sheath was exchanged for a 5 French vascular sheath. A C2 catheter was then introduced and used to select the celiac trunk. Dedicated celiac angiogram demonstrated patency of the proximal branches including the splenic, left gastric, and common hepatic arteries. The common hepatic artery was then selected. Dedicated angiogram from this location demonstrated patency of the common, gastroduodenal, proper, left, and right hepatic arteries without evidence of active extravasation. Specifically, there is no evidence of extravasation from the cystic artery or about the hepatic capsule. Attention was then turned toward the indwelling percutaneous cholecystostomy tube. The surrounding skin was anesthetized locally with 1% lidocaine . The retention suture was cut. The external portion of the drain was cut to release inter pigtail. An Amplatz wire was inserted and coiled within the gallbladder lumen. The drain was removed over the wire. Upon removal, there was immediate and voluminous dark, venous appearing hemorrhage from the skin entry site. Repeat angiogram from the proper hepatic  artery demonstrated similar findings to previous with no evidence of arterial extravasation about the established percutaneous cholecystostomy tract. A 12 French pigtail drainage catheter was then prepared and quickly exchanged over the wire with the pigtail portion then formed in the gallbladder lumen. Contrast was injected via the drain which demonstrated multiple mobile filling defects compatible with cholelithiasis. There is faint opacification of the peripheral cystic duct. The common bile duct was not visualized. A 2.4 French Progreat microcatheter and fathom 14 microwire were then used to select the cystic artery. Dedicated angiogram from the cystic artery demonstrated no evidence of active extravasation into the gallbladder lumen. Attention was then turned toward investigating the  right T9 intercostal artery. The indwelling catheter was exchanged for a flush catheter was was directed to the midthoracic aorta and thoracic angiogram was obtained. Thoracic angiogram was significant for patency and tortuosity of the visualized thoracoabdominal aorta without evidence of stenosis. The T9-T10 intercostal arteries were visualized without evidence of active extravasation about the catheter transgression site. A Mickelson catheter was directed to the distal thoracic aorta and attempted to be used to cannulate the right T9 intercostal artery which was unsuccessful. Next, a C2 catheter was attempted at this location which was partially successful at cannulating the ostium, however established access was unable to be secured despite multiple attempts. The procedure was then aborted. The catheters were removed. The right common femoral artery access was closed with a 5 French Mynx device was deployed successfully without complication. Peripheral pulses were unchanged. The patient tolerated the procedure well was transferred back to floor in good condition. IMPRESSION: 1. Technically successful percutaneous cholecystostomy  tube exchange and upsize to 12 French. Cholecystogram is significant for cholelithiasis and persistent cystic duct obstruction. 2. Upon removal of the indwelling 10 French percutaneous cholecystostomy tube, there was brisk venous appearing bleeding from the skin entry site. This terminated after up sizing. 3. Hepatic and thoracic angiography demonstrate no evidence of active extravasation about the gallbladder, hepatic capsule, or catheter entry site about the right mid axillary thorax. Ester Sides, MD [facility] Radiology Electronically Signed   By: Ester Sides M.D.   On: 03/10/2024 19:48    Scheduled Meds:  Chlorhexidine  Gluconate Cloth  6 each Topical Q0600   folic acid   1 mg Intravenous Daily   lidocaine   1 patch Transdermal Q24H   sodium chloride  flush  5 mL Intracatheter Q8H   Continuous Infusions:   LOS: 10 days   Norval Bar, MD  [facility]  03/11/2024, 2:55 PM   "

## 2024-03-11 NOTE — Plan of Care (Signed)

## 2024-03-11 NOTE — Plan of Care (Signed)
" °  Problem: Education: Goal: Knowledge of General Education information will improve Description: Including pain rating scale, medication(s)/side effects and non-pharmacologic comfort measures Outcome: Progressing   Problem: Clinical Measurements: Goal: Will remain free from infection Outcome: Progressing   Problem: Clinical Measurements: Goal: Diagnostic test results will improve Outcome: Progressing   Problem: Clinical Measurements: Goal: Respiratory complications will improve Outcome: Progressing   Problem: Nutrition: Goal: Adequate nutrition will be maintained Outcome: Progressing   Problem: Coping: Goal: Level of anxiety will decrease Outcome: Progressing   Problem: Elimination: Goal: Will not experience complications related to bowel motility Outcome: Progressing   "

## 2024-03-11 NOTE — Progress Notes (Signed)
 Mobility Specialist Progress Note:    03/11/24 1145  Mobility  Activity Turned to back - supine (Calf Presses, Leg Lifts, Heel Slides)  Level of Assistance Standby assist, set-up cues, supervision of patient - no hands on  Assistive Device None  Range of Motion/Exercises Active  Activity Response Tolerated fair  Mobility Referral Yes  Mobility visit 1 Mobility  Mobility Specialist Start Time (ACUTE ONLY) 1145  Mobility Specialist Stop Time (ACUTE ONLY) 1154  Mobility Specialist Time Calculation (min) (ACUTE ONLY) 9 min   Received pt not agreeable to ambulation d/t blood transfusion and feeling weak but agreeable to bed mobility. Otherwise, pt no c/o any symptoms. Pt able to perform movements decently w/ no assist.Left pt in bed w/ all needs met.   Venetia Keel Mobility Specialist Please Neurosurgeon or Rehab Office at [phone]

## 2024-03-11 NOTE — Progress Notes (Signed)
 Ordered 7 day Zio monitor to assess PVC burden at the request of Dr. Okey.   [name] E Steven Sacks, PA-C 03/11/2024 11:33 AM

## 2024-03-11 NOTE — Progress Notes (Signed)
 " [facility] Progress Note    Assessment/ Plan:   AKI on CKD3a, oliguric -baseline Cr around 1.1-1.3 with AKI likely related to ATN - Currently on MWF schedule, no clear evidence of GFR recovery, HD today 1-2LUF, no heparin , 3.5 hours, temporary HD catheter - s/p hepatic and thoracic angiogram + cholecystostomy tube exchange 12/28 which may delay recovery - Has had elevated bladder scan results but potentially related to hemoperitoneum, pelvic ultrasound pending to clarify  Anemia Hemoglobin stable 7   Acute cholecystitis -per primary service. Surgery following -s/p choley drain 12/21 with IR, bloody drainage; tube exchanged 12/28   Sepsis -secondary to gastroenteritis +/- cholecystitis - Antibiotics per primary.  Now much improved   Left renal mass -recommend MRI with and without contrast as outpatient. If here longer than expected then can be done prior to discharge, will need to see urology if mass is suspicious for RCC   HTN -BP soft, holding  anti-HTNs   Thrombocytopenia -work up per primary mild, stable    Subjective:   Abdominal angiogram yesterday without evidence of extravasation Cholecystostomy tube upsized to bag drainage No a.m. labs Only 0.15 L UOP yesterday Had pelvic ultrasound yesterday, results pending Last HD 12/27   Objective:   BP 126/83 (BP Location: Left Arm)   Pulse 97   Temp (!) 97.5 F (36.4 C) (Oral)   Resp 18   Ht 6' 1 (1.854 m)   Wt 92.2 kg   SpO2 96%   BMI 26.82 kg/m   Intake/Output Summary (Last 24 hours) at 03/11/2024 9193 Last data filed at 03/11/2024 0000 Gross per 24 hour  Intake 913.14 ml  Output 300 ml  Net 613.14 ml   Weight change:   Physical Exam: Gen: Lying in bed in no distress CVS: Normal rate, no rub Resp: Bilateral chest rise with no increased work of breathing Abd: +choley drain, moderately distended Ext: no edema Neuro: awake, alert GU: no foley  Imaging: IR Angiogram Visceral  Selective Result Date: 03/10/2024 INDICATION: 78 year old male with history of intraperitoneal hemorrhage status post percutaneous cholecystostomy tube placement with persistent anemia despite transfusion. EXAM: SELECTIVE VISCERAL ARTERIOGRAPHY; THORACIC AORTOGRAPHY; ADDITIONAL ARTERIOGRAPHY; IR ULTRASOUND GUIDANCE VASC ACCESS RIGHT; IR EXCHANGE BILARY DRAIN MEDICATIONS: None. ANESTHESIA/SEDATION: Moderate (conscious) sedation was employed during this procedure. A total of Versed  2 mg and Fentanyl  100 mcg was administered intravenously. Moderate Sedation Time: 55 minutes. The patient's level of consciousness and vital signs were monitored continuously by radiology nursing throughout the procedure under my direct supervision. CONTRAST:  80mL OMNIPAQUE  IOHEXOL  300 MG/ML SOLN, 20mL OMNIPAQUE  IOHEXOL  300 MG/ML SOLN, <See Chart> OMNIPAQUE  IOHEXOL  300 MG/ML SOLN, 70mL OMNIPAQUE  IOHEXOL  300 MG/ML SOLN FLUOROSCOPY: Radiation Exposure Index (as provided by the fluoroscopic device): 1,170 mGy reference air Kerma COMPLICATIONS: None immediate. PROCEDURE: Informed consent was obtained from the patient following explanation of the procedure, risks, benefits and alternatives. The patient understands, agrees and consents for the procedure. All questions were addressed. A time out was performed prior to the initiation of the procedure. Maximal barrier sterile technique utilized including caps, mask, sterile gowns, sterile gloves, large sterile drape, hand hygiene, and Betadine  prep. Preprocedure ultrasound evaluation demonstrated patency of the right common femoral artery. The procedure was planned. Subdermal Local anesthesia was administered at the planned needle entry site with 1% lidocaine . A small skin nick was made. Under direct ultrasound visualization, a 20 gauge micropuncture needle was directed into the right common femoral artery. A permanent ultrasound image was captured and stored in  the record. A J wire was then  directed to the proximal abdominal aorta over which the micropuncture sheath was exchanged for a 5 French vascular sheath. A C2 catheter was then introduced and used to select the celiac trunk. Dedicated celiac angiogram demonstrated patency of the proximal branches including the splenic, left gastric, and common hepatic arteries. The common hepatic artery was then selected. Dedicated angiogram from this location demonstrated patency of the common, gastroduodenal, proper, left, and right hepatic arteries without evidence of active extravasation. Specifically, there is no evidence of extravasation from the cystic artery or about the hepatic capsule. Attention was then turned toward the indwelling percutaneous cholecystostomy tube. The surrounding skin was anesthetized locally with 1% lidocaine . The retention suture was cut. The external portion of the drain was cut to release inter pigtail. An Amplatz wire was inserted and coiled within the gallbladder lumen. The drain was removed over the wire. Upon removal, there was immediate and voluminous dark, venous appearing hemorrhage from the skin entry site. Repeat angiogram from the proper hepatic artery demonstrated similar findings to previous with no evidence of arterial extravasation about the established percutaneous cholecystostomy tract. A 12 French pigtail drainage catheter was then prepared and quickly exchanged over the wire with the pigtail portion then formed in the gallbladder lumen. Contrast was injected via the drain which demonstrated multiple mobile filling defects compatible with cholelithiasis. There is faint opacification of the peripheral cystic duct. The common bile duct was not visualized. A 2.4 French Progreat microcatheter and fathom 14 microwire were then used to select the cystic artery. Dedicated angiogram from the cystic artery demonstrated no evidence of active extravasation into the gallbladder lumen. Attention was then turned toward  investigating the right T9 intercostal artery. The indwelling catheter was exchanged for a flush catheter was was directed to the midthoracic aorta and thoracic angiogram was obtained. Thoracic angiogram was significant for patency and tortuosity of the visualized thoracoabdominal aorta without evidence of stenosis. The T9-T10 intercostal arteries were visualized without evidence of active extravasation about the catheter transgression site. A Mickelson catheter was directed to the distal thoracic aorta and attempted to be used to cannulate the right T9 intercostal artery which was unsuccessful. Next, a C2 catheter was attempted at this location which was partially successful at cannulating the ostium, however established access was unable to be secured despite multiple attempts. The procedure was then aborted. The catheters were removed. The right common femoral artery access was closed with a 5 French Mynx device was deployed successfully without complication. Peripheral pulses were unchanged. The patient tolerated the procedure well was transferred back to floor in good condition. IMPRESSION: 1. Technically successful percutaneous cholecystostomy tube exchange and upsize to 12 French. Cholecystogram is significant for cholelithiasis and persistent cystic duct obstruction. 2. Upon removal of the indwelling 10 French percutaneous cholecystostomy tube, there was brisk venous appearing bleeding from the skin entry site. This terminated after up sizing. 3. Hepatic and thoracic angiography demonstrate no evidence of active extravasation about the gallbladder, hepatic capsule, or catheter entry site about the right mid axillary thorax. Ester Sides, MD [facility] Radiology Electronically Signed   By: Ester Sides M.D.   On: 03/10/2024 19:48   IR Aorta/Thoracic Result Date: 03/10/2024 INDICATION: 78 year old male with history of intraperitoneal hemorrhage status post  percutaneous cholecystostomy tube placement with persistent anemia despite transfusion. EXAM: SELECTIVE VISCERAL ARTERIOGRAPHY; THORACIC AORTOGRAPHY; ADDITIONAL ARTERIOGRAPHY; IR ULTRASOUND GUIDANCE VASC ACCESS RIGHT; IR EXCHANGE BILARY DRAIN MEDICATIONS: None. ANESTHESIA/SEDATION: Moderate (  conscious) sedation was employed during this procedure. A total of Versed  2 mg and Fentanyl  100 mcg was administered intravenously. Moderate Sedation Time: 55 minutes. The patient's level of consciousness and vital signs were monitored continuously by radiology nursing throughout the procedure under my direct supervision. CONTRAST:  80mL OMNIPAQUE  IOHEXOL  300 MG/ML SOLN, 20mL OMNIPAQUE  IOHEXOL  300 MG/ML SOLN, <See Chart> OMNIPAQUE  IOHEXOL  300 MG/ML SOLN, 70mL OMNIPAQUE  IOHEXOL  300 MG/ML SOLN FLUOROSCOPY: Radiation Exposure Index (as provided by the fluoroscopic device): 1,170 mGy reference air Kerma COMPLICATIONS: None immediate. PROCEDURE: Informed consent was obtained from the patient following explanation of the procedure, risks, benefits and alternatives. The patient understands, agrees and consents for the procedure. All questions were addressed. A time out was performed prior to the initiation of the procedure. Maximal barrier sterile technique utilized including caps, mask, sterile gowns, sterile gloves, large sterile drape, hand hygiene, and Betadine  prep. Preprocedure ultrasound evaluation demonstrated patency of the right common femoral artery. The procedure was planned. Subdermal Local anesthesia was administered at the planned needle entry site with 1% lidocaine . A small skin nick was made. Under direct ultrasound visualization, a 20 gauge micropuncture needle was directed into the right common femoral artery. A permanent ultrasound image was captured and stored in the record. A J wire was then directed to the proximal abdominal aorta over which the micropuncture sheath was exchanged for a 5 French vascular sheath. A  C2 catheter was then introduced and used to select the celiac trunk. Dedicated celiac angiogram demonstrated patency of the proximal branches including the splenic, left gastric, and common hepatic arteries. The common hepatic artery was then selected. Dedicated angiogram from this location demonstrated patency of the common, gastroduodenal, proper, left, and right hepatic arteries without evidence of active extravasation. Specifically, there is no evidence of extravasation from the cystic artery or about the hepatic capsule. Attention was then turned toward the indwelling percutaneous cholecystostomy tube. The surrounding skin was anesthetized locally with 1% lidocaine . The retention suture was cut. The external portion of the drain was cut to release inter pigtail. An Amplatz wire was inserted and coiled within the gallbladder lumen. The drain was removed over the wire. Upon removal, there was immediate and voluminous dark, venous appearing hemorrhage from the skin entry site. Repeat angiogram from the proper hepatic artery demonstrated similar findings to previous with no evidence of arterial extravasation about the established percutaneous cholecystostomy tract. A 12 French pigtail drainage catheter was then prepared and quickly exchanged over the wire with the pigtail portion then formed in the gallbladder lumen. Contrast was injected via the drain which demonstrated multiple mobile filling defects compatible with cholelithiasis. There is faint opacification of the peripheral cystic duct. The common bile duct was not visualized. A 2.4 French Progreat microcatheter and fathom 14 microwire were then used to select the cystic artery. Dedicated angiogram from the cystic artery demonstrated no evidence of active extravasation into the gallbladder lumen. Attention was then turned toward investigating the right T9 intercostal artery. The indwelling catheter was exchanged for a flush catheter was was directed to the  midthoracic aorta and thoracic angiogram was obtained. Thoracic angiogram was significant for patency and tortuosity of the visualized thoracoabdominal aorta without evidence of stenosis. The T9-T10 intercostal arteries were visualized without evidence of active extravasation about the catheter transgression site. A Mickelson catheter was directed to the distal thoracic aorta and attempted to be used to cannulate the right T9 intercostal artery which was unsuccessful. Next, a C2 catheter was attempted at this location  which was partially successful at cannulating the ostium, however established access was unable to be secured despite multiple attempts. The procedure was then aborted. The catheters were removed. The right common femoral artery access was closed with a 5 French Mynx device was deployed successfully without complication. Peripheral pulses were unchanged. The patient tolerated the procedure well was transferred back to floor in good condition. IMPRESSION: 1. Technically successful percutaneous cholecystostomy tube exchange and upsize to 12 French. Cholecystogram is significant for cholelithiasis and persistent cystic duct obstruction. 2. Upon removal of the indwelling 10 French percutaneous cholecystostomy tube, there was brisk venous appearing bleeding from the skin entry site. This terminated after up sizing. 3. Hepatic and thoracic angiography demonstrate no evidence of active extravasation about the gallbladder, hepatic capsule, or catheter entry site about the right mid axillary thorax. Ester Sides, MD [facility] Radiology Electronically Signed   By: Ester Sides M.D.   On: 03/10/2024 19:48   IR US  Guide Vasc Access Right Result Date: 03/10/2024 INDICATION: 78 year old male with history of intraperitoneal hemorrhage status post percutaneous cholecystostomy tube placement with persistent anemia despite transfusion. EXAM: SELECTIVE VISCERAL  ARTERIOGRAPHY; THORACIC AORTOGRAPHY; ADDITIONAL ARTERIOGRAPHY; IR ULTRASOUND GUIDANCE VASC ACCESS RIGHT; IR EXCHANGE BILARY DRAIN MEDICATIONS: None. ANESTHESIA/SEDATION: Moderate (conscious) sedation was employed during this procedure. A total of Versed  2 mg and Fentanyl  100 mcg was administered intravenously. Moderate Sedation Time: 55 minutes. The patient's level of consciousness and vital signs were monitored continuously by radiology nursing throughout the procedure under my direct supervision. CONTRAST:  80mL OMNIPAQUE  IOHEXOL  300 MG/ML SOLN, 20mL OMNIPAQUE  IOHEXOL  300 MG/ML SOLN, <See Chart> OMNIPAQUE  IOHEXOL  300 MG/ML SOLN, 70mL OMNIPAQUE  IOHEXOL  300 MG/ML SOLN FLUOROSCOPY: Radiation Exposure Index (as provided by the fluoroscopic device): 1,170 mGy reference air Kerma COMPLICATIONS: None immediate. PROCEDURE: Informed consent was obtained from the patient following explanation of the procedure, risks, benefits and alternatives. The patient understands, agrees and consents for the procedure. All questions were addressed. A time out was performed prior to the initiation of the procedure. Maximal barrier sterile technique utilized including caps, mask, sterile gowns, sterile gloves, large sterile drape, hand hygiene, and Betadine  prep. Preprocedure ultrasound evaluation demonstrated patency of the right common femoral artery. The procedure was planned. Subdermal Local anesthesia was administered at the planned needle entry site with 1% lidocaine . A small skin nick was made. Under direct ultrasound visualization, a 20 gauge micropuncture needle was directed into the right common femoral artery. A permanent ultrasound image was captured and stored in the record. A J wire was then directed to the proximal abdominal aorta over which the micropuncture sheath was exchanged for a 5 French vascular sheath. A C2 catheter was then introduced and used to select the celiac trunk. Dedicated celiac angiogram demonstrated  patency of the proximal branches including the splenic, left gastric, and common hepatic arteries. The common hepatic artery was then selected. Dedicated angiogram from this location demonstrated patency of the common, gastroduodenal, proper, left, and right hepatic arteries without evidence of active extravasation. Specifically, there is no evidence of extravasation from the cystic artery or about the hepatic capsule. Attention was then turned toward the indwelling percutaneous cholecystostomy tube. The surrounding skin was anesthetized locally with 1% lidocaine . The retention suture was cut. The external portion of the drain was cut to release inter pigtail. An Amplatz wire was inserted and coiled within the gallbladder lumen. The drain was removed over the wire. Upon removal, there was immediate and voluminous dark, venous appearing hemorrhage  from the skin entry site. Repeat angiogram from the proper hepatic artery demonstrated similar findings to previous with no evidence of arterial extravasation about the established percutaneous cholecystostomy tract. A 12 French pigtail drainage catheter was then prepared and quickly exchanged over the wire with the pigtail portion then formed in the gallbladder lumen. Contrast was injected via the drain which demonstrated multiple mobile filling defects compatible with cholelithiasis. There is faint opacification of the peripheral cystic duct. The common bile duct was not visualized. A 2.4 French Progreat microcatheter and fathom 14 microwire were then used to select the cystic artery. Dedicated angiogram from the cystic artery demonstrated no evidence of active extravasation into the gallbladder lumen. Attention was then turned toward investigating the right T9 intercostal artery. The indwelling catheter was exchanged for a flush catheter was was directed to the midthoracic aorta and thoracic angiogram was obtained. Thoracic angiogram was significant for patency and  tortuosity of the visualized thoracoabdominal aorta without evidence of stenosis. The T9-T10 intercostal arteries were visualized without evidence of active extravasation about the catheter transgression site. A Mickelson catheter was directed to the distal thoracic aorta and attempted to be used to cannulate the right T9 intercostal artery which was unsuccessful. Next, a C2 catheter was attempted at this location which was partially successful at cannulating the ostium, however established access was unable to be secured despite multiple attempts. The procedure was then aborted. The catheters were removed. The right common femoral artery access was closed with a 5 French Mynx device was deployed successfully without complication. Peripheral pulses were unchanged. The patient tolerated the procedure well was transferred back to floor in good condition. IMPRESSION: 1. Technically successful percutaneous cholecystostomy tube exchange and upsize to 12 French. Cholecystogram is significant for cholelithiasis and persistent cystic duct obstruction. 2. Upon removal of the indwelling 10 French percutaneous cholecystostomy tube, there was brisk venous appearing bleeding from the skin entry site. This terminated after up sizing. 3. Hepatic and thoracic angiography demonstrate no evidence of active extravasation about the gallbladder, hepatic capsule, or catheter entry site about the right mid axillary thorax. Ester Sides, MD [facility] Radiology Electronically Signed   By: Ester Sides M.D.   On: 03/10/2024 19:48   IR Angiogram Selective Each Additional Vessel Result Date: 03/10/2024 INDICATION: 78 year old male with history of intraperitoneal hemorrhage status post percutaneous cholecystostomy tube placement with persistent anemia despite transfusion. EXAM: SELECTIVE VISCERAL ARTERIOGRAPHY; THORACIC AORTOGRAPHY; ADDITIONAL ARTERIOGRAPHY; IR ULTRASOUND GUIDANCE VASC  ACCESS RIGHT; IR EXCHANGE BILARY DRAIN MEDICATIONS: None. ANESTHESIA/SEDATION: Moderate (conscious) sedation was employed during this procedure. A total of Versed  2 mg and Fentanyl  100 mcg was administered intravenously. Moderate Sedation Time: 55 minutes. The patient's level of consciousness and vital signs were monitored continuously by radiology nursing throughout the procedure under my direct supervision. CONTRAST:  80mL OMNIPAQUE  IOHEXOL  300 MG/ML SOLN, 20mL OMNIPAQUE  IOHEXOL  300 MG/ML SOLN, <See Chart> OMNIPAQUE  IOHEXOL  300 MG/ML SOLN, 70mL OMNIPAQUE  IOHEXOL  300 MG/ML SOLN FLUOROSCOPY: Radiation Exposure Index (as provided by the fluoroscopic device): 1,170 mGy reference air Kerma COMPLICATIONS: None immediate. PROCEDURE: Informed consent was obtained from the patient following explanation of the procedure, risks, benefits and alternatives. The patient understands, agrees and consents for the procedure. All questions were addressed. A time out was performed prior to the initiation of the procedure. Maximal barrier sterile technique utilized including caps, mask, sterile gowns, sterile gloves, large sterile drape, hand hygiene, and Betadine  prep. Preprocedure ultrasound evaluation demonstrated patency of the right common femoral artery.  The procedure was planned. Subdermal Local anesthesia was administered at the planned needle entry site with 1% lidocaine . A small skin nick was made. Under direct ultrasound visualization, a 20 gauge micropuncture needle was directed into the right common femoral artery. A permanent ultrasound image was captured and stored in the record. A J wire was then directed to the proximal abdominal aorta over which the micropuncture sheath was exchanged for a 5 French vascular sheath. A C2 catheter was then introduced and used to select the celiac trunk. Dedicated celiac angiogram demonstrated patency of the proximal branches including the splenic, left gastric, and common hepatic  arteries. The common hepatic artery was then selected. Dedicated angiogram from this location demonstrated patency of the common, gastroduodenal, proper, left, and right hepatic arteries without evidence of active extravasation. Specifically, there is no evidence of extravasation from the cystic artery or about the hepatic capsule. Attention was then turned toward the indwelling percutaneous cholecystostomy tube. The surrounding skin was anesthetized locally with 1% lidocaine . The retention suture was cut. The external portion of the drain was cut to release inter pigtail. An Amplatz wire was inserted and coiled within the gallbladder lumen. The drain was removed over the wire. Upon removal, there was immediate and voluminous dark, venous appearing hemorrhage from the skin entry site. Repeat angiogram from the proper hepatic artery demonstrated similar findings to previous with no evidence of arterial extravasation about the established percutaneous cholecystostomy tract. A 12 French pigtail drainage catheter was then prepared and quickly exchanged over the wire with the pigtail portion then formed in the gallbladder lumen. Contrast was injected via the drain which demonstrated multiple mobile filling defects compatible with cholelithiasis. There is faint opacification of the peripheral cystic duct. The common bile duct was not visualized. A 2.4 French Progreat microcatheter and fathom 14 microwire were then used to select the cystic artery. Dedicated angiogram from the cystic artery demonstrated no evidence of active extravasation into the gallbladder lumen. Attention was then turned toward investigating the right T9 intercostal artery. The indwelling catheter was exchanged for a flush catheter was was directed to the midthoracic aorta and thoracic angiogram was obtained. Thoracic angiogram was significant for patency and tortuosity of the visualized thoracoabdominal aorta without evidence of stenosis. The T9-T10  intercostal arteries were visualized without evidence of active extravasation about the catheter transgression site. A Mickelson catheter was directed to the distal thoracic aorta and attempted to be used to cannulate the right T9 intercostal artery which was unsuccessful. Next, a C2 catheter was attempted at this location which was partially successful at cannulating the ostium, however established access was unable to be secured despite multiple attempts. The procedure was then aborted. The catheters were removed. The right common femoral artery access was closed with a 5 French Mynx device was deployed successfully without complication. Peripheral pulses were unchanged. The patient tolerated the procedure well was transferred back to floor in good condition. IMPRESSION: 1. Technically successful percutaneous cholecystostomy tube exchange and upsize to 12 French. Cholecystogram is significant for cholelithiasis and persistent cystic duct obstruction. 2. Upon removal of the indwelling 10 French percutaneous cholecystostomy tube, there was brisk venous appearing bleeding from the skin entry site. This terminated after up sizing. 3. Hepatic and thoracic angiography demonstrate no evidence of active extravasation about the gallbladder, hepatic capsule, or catheter entry site about the right mid axillary thorax. Ester Sides, MD [facility] Radiology Electronically Signed   By: Ester Sides M.D.   On: 03/10/2024  19:48   IR EXCHANGE BILIARY DRAIN Result Date: 03/10/2024 INDICATION: 78 year old male with history of intraperitoneal hemorrhage status post percutaneous cholecystostomy tube placement with persistent anemia despite transfusion. EXAM: SELECTIVE VISCERAL ARTERIOGRAPHY; THORACIC AORTOGRAPHY; ADDITIONAL ARTERIOGRAPHY; IR ULTRASOUND GUIDANCE VASC ACCESS RIGHT; IR EXCHANGE BILARY DRAIN MEDICATIONS: None. ANESTHESIA/SEDATION: Moderate (conscious) sedation was  employed during this procedure. A total of Versed  2 mg and Fentanyl  100 mcg was administered intravenously. Moderate Sedation Time: 55 minutes. The patient's level of consciousness and vital signs were monitored continuously by radiology nursing throughout the procedure under my direct supervision. CONTRAST:  80mL OMNIPAQUE  IOHEXOL  300 MG/ML SOLN, 20mL OMNIPAQUE  IOHEXOL  300 MG/ML SOLN, <See Chart> OMNIPAQUE  IOHEXOL  300 MG/ML SOLN, 70mL OMNIPAQUE  IOHEXOL  300 MG/ML SOLN FLUOROSCOPY: Radiation Exposure Index (as provided by the fluoroscopic device): 1,170 mGy reference air Kerma COMPLICATIONS: None immediate. PROCEDURE: Informed consent was obtained from the patient following explanation of the procedure, risks, benefits and alternatives. The patient understands, agrees and consents for the procedure. All questions were addressed. A time out was performed prior to the initiation of the procedure. Maximal barrier sterile technique utilized including caps, mask, sterile gowns, sterile gloves, large sterile drape, hand hygiene, and Betadine  prep. Preprocedure ultrasound evaluation demonstrated patency of the right common femoral artery. The procedure was planned. Subdermal Local anesthesia was administered at the planned needle entry site with 1% lidocaine . A small skin nick was made. Under direct ultrasound visualization, a 20 gauge micropuncture needle was directed into the right common femoral artery. A permanent ultrasound image was captured and stored in the record. A J wire was then directed to the proximal abdominal aorta over which the micropuncture sheath was exchanged for a 5 French vascular sheath. A C2 catheter was then introduced and used to select the celiac trunk. Dedicated celiac angiogram demonstrated patency of the proximal branches including the splenic, left gastric, and common hepatic arteries. The common hepatic artery was then selected. Dedicated angiogram from this location demonstrated patency of  the common, gastroduodenal, proper, left, and right hepatic arteries without evidence of active extravasation. Specifically, there is no evidence of extravasation from the cystic artery or about the hepatic capsule. Attention was then turned toward the indwelling percutaneous cholecystostomy tube. The surrounding skin was anesthetized locally with 1% lidocaine . The retention suture was cut. The external portion of the drain was cut to release inter pigtail. An Amplatz wire was inserted and coiled within the gallbladder lumen. The drain was removed over the wire. Upon removal, there was immediate and voluminous dark, venous appearing hemorrhage from the skin entry site. Repeat angiogram from the proper hepatic artery demonstrated similar findings to previous with no evidence of arterial extravasation about the established percutaneous cholecystostomy tract. A 12 French pigtail drainage catheter was then prepared and quickly exchanged over the wire with the pigtail portion then formed in the gallbladder lumen. Contrast was injected via the drain which demonstrated multiple mobile filling defects compatible with cholelithiasis. There is faint opacification of the peripheral cystic duct. The common bile duct was not visualized. A 2.4 French Progreat microcatheter and fathom 14 microwire were then used to select the cystic artery. Dedicated angiogram from the cystic artery demonstrated no evidence of active extravasation into the gallbladder lumen. Attention was then turned toward investigating the right T9 intercostal artery. The indwelling catheter was exchanged for a flush catheter was was directed to the midthoracic aorta and thoracic angiogram was obtained. Thoracic angiogram was significant for patency and tortuosity of the visualized thoracoabdominal aorta without evidence of  stenosis. The T9-T10 intercostal arteries were visualized without evidence of active extravasation about the catheter transgression site. A  Mickelson catheter was directed to the distal thoracic aorta and attempted to be used to cannulate the right T9 intercostal artery which was unsuccessful. Next, a C2 catheter was attempted at this location which was partially successful at cannulating the ostium, however established access was unable to be secured despite multiple attempts. The procedure was then aborted. The catheters were removed. The right common femoral artery access was closed with a 5 French Mynx device was deployed successfully without complication. Peripheral pulses were unchanged. The patient tolerated the procedure well was transferred back to floor in good condition. IMPRESSION: 1. Technically successful percutaneous cholecystostomy tube exchange and upsize to 12 French. Cholecystogram is significant for cholelithiasis and persistent cystic duct obstruction. 2. Upon removal of the indwelling 10 French percutaneous cholecystostomy tube, there was brisk venous appearing bleeding from the skin entry site. This terminated after up sizing. 3. Hepatic and thoracic angiography demonstrate no evidence of active extravasation about the gallbladder, hepatic capsule, or catheter entry site about the right mid axillary thorax. Ester Sides, MD [facility] Radiology Electronically Signed   By: Ester Sides M.D.   On: 03/10/2024 19:48     Labs: BMET Recent Labs  Lab 03/05/24 0345 03/06/24 0300 03/07/24 0500 03/08/24 1100 03/09/24 0452 03/10/24 0718  NA 136 136 138 138 137 135  K 4.0 3.4* 3.3* 4.0 4.0 4.4  CL 103 102 102 102 102 99  CO2 14* 22 24 24 23 25   GLUCOSE 77 110* 105* 124* 92 102*  BUN 96* 70* 50* 61* 66* 41*  CREATININE 7.94* 6.23* 4.97* 6.17* 6.45* 4.70*  CALCIUM  7.9* 7.7* 7.7* 7.6* 7.5* 7.4*  PHOS 5.7* 4.1 3.4 5.6*  --   --    CBC Recent Labs  Lab 03/07/24 0500 03/08/24 1058 03/10/24 0722 03/10/24 1539 03/10/24 2352 03/11/24 0548  WBC 8.1   < > 13.8* 10.7*  10.8* 10.4  NEUTROABS 6.3  --   --   --   --   --   HGB 11.0*   < > 6.0* 7.3* 7.3* 7.0*  HCT 32.3*   < > 17.6* 21.5* 21.6* 20.7*  MCV 85.9   < > 86.7 87.8 87.8 90.0  PLT 88*   < > 108* 93* 105* 109*   < > = values in this interval not displayed.    Medications:     Chlorhexidine  Gluconate Cloth  6 each Topical Q0600   folic acid   1 mg Intravenous Daily   lidocaine   1 patch Transdermal Q24H   sodium chloride  flush  5 mL Intracatheter Q8H      "

## 2024-03-12 DIAGNOSIS — N179 Acute kidney failure, unspecified: Secondary | ICD-10-CM | POA: Diagnosis not present

## 2024-03-12 LAB — CBC
HCT: 24.8 % — ABNORMAL LOW (ref 39.0–52.0)
Hemoglobin: 8.1 g/dL — ABNORMAL LOW (ref 13.0–17.0)
MCH: 30 pg (ref 26.0–34.0)
MCHC: 32.7 g/dL (ref 30.0–36.0)
MCV: 91.9 fL (ref 80.0–100.0)
Platelets: 109 K/uL — ABNORMAL LOW (ref 150–400)
RBC: 2.7 MIL/uL — ABNORMAL LOW (ref 4.22–5.81)
RDW: 15.3 % (ref 11.5–15.5)
WBC: 10 K/uL (ref 4.0–10.5)
nRBC: 0 % (ref 0.0–0.2)

## 2024-03-12 LAB — BASIC METABOLIC PANEL WITH GFR
Anion gap: 8 (ref 5–15)
BUN: 30 mg/dL — ABNORMAL HIGH (ref 8–23)
CO2: 29 mmol/L (ref 22–32)
Calcium: 7.6 mg/dL — ABNORMAL LOW (ref 8.9–10.3)
Chloride: 101 mmol/L (ref 98–111)
Creatinine, Ser: 3.24 mg/dL — ABNORMAL HIGH (ref 0.61–1.24)
GFR, Estimated: 19 mL/min — ABNORMAL LOW
Glucose, Bld: 98 mg/dL (ref 70–99)
Potassium: 4 mmol/L (ref 3.5–5.1)
Sodium: 138 mmol/L (ref 135–145)

## 2024-03-12 MED ORDER — FOLIC ACID 1 MG PO TABS
1.0000 mg | ORAL_TABLET | Freq: Every day | ORAL | Status: DC
  Administered 2024-03-15 – 2024-03-16 (×2): 1 mg via ORAL
  Filled 2024-03-12 (×2): qty 1

## 2024-03-12 MED ORDER — FOLIC ACID 5 MG/ML IJ SOLN
1.0000 mg | Freq: Every day | INTRAMUSCULAR | Status: AC
  Administered 2024-03-12 – 2024-03-14 (×3): 1 mg via INTRAVENOUS
  Filled 2024-03-12 (×4): qty 0.2

## 2024-03-12 MED ORDER — HEPARIN SODIUM (PORCINE) 5000 UNIT/ML IJ SOLN
5000.0000 [IU] | Freq: Three times a day (TID) | INTRAMUSCULAR | Status: DC
  Administered 2024-03-12 – 2024-03-16 (×12): 5000 [IU] via SUBCUTANEOUS
  Filled 2024-03-12 (×12): qty 1

## 2024-03-12 NOTE — Progress Notes (Addendum)
 " PROGRESS NOTE    [name]  FMW:[id number] DOB: 06-19-1945 DOA: 03/01/2024 PCP: Kennyth Worth HERO, MD  Subjective:  No acute events overnight. Seen and examined at bedside. Reports feeling better with some improvement in weakness. Reports making more urine than yesterday. Denies nausea, vomiting, constipation.    Hospital Course:  78 year old male with PMHx of HTN and HLD who presented to the ED on 03/01/2024 with several days of nausea, vomiting, and diarrhea. Symptoms began on 12/14 after returning from church and eating a frozen [employer] chicken meal, followed about an hour later by chills/subjective fevers with rigors, then profuse emesis and subsequent multiple episodes of loose watery stools. Emesis progressed from food contents to watery, nonbloody. Stools watery without blood or mucus. He reports poor p.o. intake since Sunday and generalized, nonfocal abdominal discomfort. Also notes markedly decreased urine output over the past 2 days with dark orange urine, without gross hematuria. In the ED he was found to have AKI and hypokalemia, with CT abdomen findings concerning for acute cholecystitis. He was admitted to the hospital service for further management. Now s/p perc chole tube. Has temp HD cath and is getting HD per renal.   Hospital course complicated by hypotension, hemoperitoneum due to bleeding at biliary tube insertion site, acute blood loss anemia and thrombocytopenia, folate deficiency, acute hypoxic respiratory failure, and lactic acidosis.   Assessment and Plan:  Hypotension Hemoperitoneum Acute blood loss anemia Acute thrombocytopenia Folate deficiency History of HTN - low BP due to hemoperitoneum, and poor oral intake - status post hepatic and thoracic angiogram and cholecystostomy tube exchange with up sizing on 12/28 that showed  brisk venous bleeding upon removal of the indwelling 10 French percutaneous cholecystostomy tube that was terminated after up  sizing. - Hgb 8.1 < 8.3, trough at 5.5, baseline 13-14  - platelets 109 < 111, baseline > 150 - folate 3.8 - Continue holding home Losartan  (also in setting of AKI) - hold Troprol-XL 25 mg daily - cont IV folic acid  given severe symptomatic anemia. Plan for 5 day course then PO supplementation - transfuse for Hgb goal > 7 - transfuse for platelet goal > 50 given concerns of acute bleed - IR team following   AKI on CKD stage IIIa  Oliguria High anion gap metabolic acidosis - Likely prerenal in the setting of decreased PO intake and increased GI losses with concomitant Losartan  use. Nephrology suspecting ATN given no obstruction.  - Cr 3.24 < 6.45, peaked to 7.9 12/23 - now s/p temp HD cath  - still makes urine - monitor I/Os, renal function - dialysis as per nephro recs - nephrology following   Acute hypoxic respiratory failure  - Currently on 3 < 2L , not on O2 at baseline  - likely multifactorial in the setting of acute symptomatic anemia, atelectasis from abdominal distension, splinting from acute cholecystitis and biliary drain site pain - clinically hypovolemic given acute bleeding as elsewhere - Echo (12/22) showed LVEF 55-60% with no RWMA, mild concentric LVH, G1DD, normal RV function, no valvular abnormalities - CXR elevated R hemidiaphragm with R basilar opacity (atelectasis vs PNA) and L basilar atelectasis, rounded R paratracheal density (tortuous vessel vs adenopathy), recommended CT chest w/ contrast. No effusion or PTX.  - HD sessions as per nephro recs - treatment of acute blood loss anemia as elsewhere - continue to wean oxygen as tolerated  - nephrology following   Acute urinary retention - etiology unclear - Likely multifactorial in the setting of  acute illness with decreased mobility, hemoperitoneum, oliguric AKI on CKD - large discrepancy in bladder scan readings and voided urine on straight caths - concern patient with hemoperitoneum that is making it hard to  accurately measure bladder volumes - having regular BMs - still makes urine - monitor I/Os, renal function - cont with intermittent bladder scans if unable to void for 8 hours - straight cath as needed for bladder volume > 400    Sepsis Acute cholecystitis - Has RUQ pain on palpation with CT evidence of acute cholecystitis without CBD dilation - now s/p perc chole drain 12/21 then exchange with up sizing 12/28  - culture growing E. coli  - Finished 5 days of IV Zosyn  post-biliary drain placement on 12/26 - Needs follow up in general surgery office in 6 weeks to discuss interval cholecystectomy.  -  Needs follow up with IR to have cholangiogram before his appt.  - Watch for high biliary output. - General surgery following - IR team following   PVCs  NSVT - Tele alerted to multiple runs of VT on 12/21 during which patient was asymptomatic and HDS. - Patient with known history of high PVC burden. - Cardiology consulted on 12/21, determined NSVT with elevated burden of PVC worsened by ongoing acute illness, recommended echo and electrolyte replacement - Echo without concerning findings, as noted above - hold Toprol -XL 25 mg given hypotension as elsewhere  - hold amiodarone given hypotension and AKI on CKD - plan for zio monitor for 7 days to assess PVC burden, ordered by cardio on 12/29 - cardiology has signed off - last note 12/25 - will need to follow up with cardiology outpatient for non-invasive ischemic evaluation   Gastroenteritis - Patient presented with N/V and diarrhea which started after eating frozen meal - No further episodes of diarrhea, nausea, or vomiting.  Tolerating renal diet - GIPP and C diff PCR negative - Continue conservative management - Abx as elsewhere for acute cholecystitis - encourage oral intake/hydrations  - PRN Zofran   - PRN imodium    Lactic acidosis - resolved - likely due to hemoperitoneum, hypovolemic/hypotension - finished antibiotics course  on 12/26 - trend lactate   UTI ruled out - Urine culture showed no growth - Abx course finished as elsewhere for acute cholecystitis   Hypokalemia  Resolved with supplementation Monitor and replete electrolytes as needed   Left renal mass - CT abd/pel showed soft tissue mass on left kidney - Will need MRI with contrast per renal mass protocol as outpatient   Abnormal CXR Right paratracheal density, tortuous vasculature vs adenopathy, needs CT with contrast when able - currently on hold with AKI   Transaminitis Direct Hyperbilirubinemia  - improving - Possibly related to acute illness in the setting of cholecystitis -  AST, ALT, and ALP normalized - T. Bili improving   HLD - Statin held due to transaminitis  DVT prophylaxis:   SQ Heparin    Code Status: Full Code  Disposition Plan: SNF Reason for continuing need for hospitalization: monitor for resolution of acute bleed, monitor for renal recovery and urinary retention, wean oxygen as tolerated, needs PT re-eval   Objective: Vitals:   03/12/24 0020 03/12/24 0400 03/12/24 0512 03/12/24 0746  BP: (!) 112/52 (!) 105/57  (!) 108/49  Pulse:   64 70  Resp: 17 19  18   Temp: 98 F (36.7 C) (!) 97.5 F (36.4 C)  97.7 F (36.5 C)  TempSrc: Oral Oral  Oral  SpO2:   91% 90%  Weight:      Height:        Intake/Output Summary (Last 24 hours) at 03/12/2024 1024 Last data filed at 03/12/2024 0546 Gross per 24 hour  Intake 1255 ml  Output 1650 ml  Net -395 ml   Filed Weights   03/10/24 0300 03/11/24 1349 03/11/24 1740  Weight: 92.2 kg 94.2 kg 95 kg    Examination:  Physical Exam Vitals and nursing note reviewed.  Constitutional:      General: He is not in acute distress.    Appearance: He is ill-appearing.     Comments: Weak, frail  HENT:     Head: Normocephalic and atraumatic.  Cardiovascular:     Rate and Rhythm: Normal rate and regular rhythm.     Pulses: Normal pulses.     Heart sounds: Normal heart  sounds.  Pulmonary:     Effort: Pulmonary effort is normal.     Breath sounds: Normal breath sounds.  Abdominal:     General: Bowel sounds are normal.     Palpations: Abdomen is soft.  Neurological:     Mental Status: He is alert.     Data Reviewed: I have personally reviewed following labs and imaging studies  CBC: Recent Labs  Lab 03/07/24 0500 03/08/24 1058 03/10/24 1539 03/10/24 2352 03/11/24 0548 03/11/24 1748 03/12/24 0600  WBC 8.1   < > 10.7* 10.8* 10.4 13.0* 10.0  NEUTROABS 6.3  --   --   --   --   --   --   HGB 11.0*   < > 7.3* 7.3* 7.0* 8.3* 8.1*  HCT 32.3*   < > 21.5* 21.6* 20.7* 25.1* 24.8*  MCV 85.9   < > 87.8 87.8 90.0 91.6 91.9  PLT 88*   < > 93* 105* 109* 111* 109*   < > = values in this interval not displayed.   Basic Metabolic Panel: Recent Labs  Lab 03/06/24 0300 03/07/24 0500 03/08/24 1100 03/09/24 0452 03/10/24 0718 03/12/24 0600  NA 136 138 138 137 135 138  K 3.4* 3.3* 4.0 4.0 4.4 4.0  CL 102 102 102 102 99 101  CO2 22 24 24 23 25 29   GLUCOSE 110* 105* 124* 92 102* 98  BUN 70* 50* 61* 66* 41* 30*  CREATININE 6.23* 4.97* 6.17* 6.45* 4.70* 3.24*  CALCIUM  7.7* 7.7* 7.6* 7.5* 7.4* 7.6*  MG  --  1.8  --   --   --   --   PHOS 4.1 3.4 5.6*  --   --   --    GFR: Estimated Creatinine Clearance: 21.9 mL/min (A) (by C-G formula based on SCr of 3.24 mg/dL (H)). Liver Function Tests: Recent Labs  Lab 03/06/24 0300 03/07/24 0500 03/08/24 1058 03/08/24 1100 03/09/24 0452 03/10/24 0718  AST 22  --  43*  --  40 43*  ALT 16  --  24  --  22 24  ALKPHOS 78  --  54  --  56 65  BILITOT 1.3*  --  0.9  --  1.0 1.0  PROT 5.7*  --  5.0*  --  5.2* 5.2*  ALBUMIN 2.6*  2.6* 2.7* 2.6* 2.6* 2.6* 2.7*   Recent Labs  Lab 03/09/24 0452  LIPASE 100*   No results for input(s): AMMONIA in the last 168 hours. Coagulation Profile: No results for input(s): INR, PROTIME in the last 168 hours. Cardiac Enzymes: No results for input(s): CKTOTAL,  CKMB, CKMBINDEX, TROPONINI in the last 168 hours. ProBNP,  BNP (last 5 results) Recent Labs    03/04/24 1129  PROBNP 18,836.0*   HbA1C: No results for input(s): HGBA1C in the last 72 hours. CBG: Recent Labs  Lab 03/09/24 0622 03/09/24 1600  GLUCAP 95 103*   Lipid Profile: No results for input(s): CHOL, HDL, LDLCALC, TRIG, CHOLHDL, LDLDIRECT in the last 72 hours. Thyroid  Function Tests: No results for input(s): TSH, T4TOTAL, FREET4, T3FREE, THYROIDAB in the last 72 hours. Anemia Panel: Recent Labs    03/10/24 0718  VITAMINB12 527  FOLATE 3.8*  FERRITIN 386*  TIBC 251  IRON 53   Sepsis Labs: Recent Labs  Lab 03/07/24 1733 03/07/24 1946 03/08/24 1058  LATICACIDVEN 3.8* 2.5* 1.3    Recent Results (from the past 240 hours)  Urine Culture (for pregnant, neutropenic or urologic patients or patients with an indwelling urinary catheter)     Status: None   Collection Time: 03/02/24 10:11 PM   Specimen: Urine, Clean Catch  Result Value Ref Range Status   Specimen Description URINE, CLEAN CATCH  Final   Special Requests NONE  Final   Culture   Final    NO GROWTH Performed at [facility], [address] [place], [place] [address]    Report Status 03/04/2024 FINAL  Final  Aerobic/Anaerobic Culture w Gram Stain (surgical/deep wound)     Status: None   Collection Time: 03/03/24  8:26 AM   Specimen: Abscess  Result Value Ref Range Status   Specimen Description ABSCESS  Final   Special Requests GALLBLADDER  Final   Gram Stain   Final    ABUNDANT WBC PRESENT, PREDOMINANTLY PMN RARE GRAM NEGATIVE RODS    Culture   Final    ABUNDANT ESCHERICHIA COLI NO ANAEROBES ISOLATED Performed at [facility], [address] [place], [place] [address]    Report Status 03/08/2024 FINAL  Final   Organism ID, Bacteria ESCHERICHIA COLI  Final      Susceptibility   Escherichia coli - MIC*    AMPICILLIN >=32 RESISTANT Resistant     CEFAZOLIN   (NON-URINE) 2 SENSITIVE Sensitive     CEFEPIME <=0.12 SENSITIVE Sensitive     ERTAPENEM <=0.12 SENSITIVE Sensitive     CEFTRIAXONE <=0.25 SENSITIVE Sensitive     CIPROFLOXACIN 0.5 INTERMEDIATE Intermediate     GENTAMICIN <=1 SENSITIVE Sensitive     MEROPENEM <=0.25 SENSITIVE Sensitive     TRIMETH/SULFA <=20 SENSITIVE Sensitive     AMPICILLIN/SULBACTAM 8 SENSITIVE Sensitive     PIP/TAZO Value in next row Sensitive      <=4 SENSITIVEThis is a modified FDA-approved test that has been validated and its performance characteristics determined by the reporting laboratory.  This laboratory is certified under the Clinical Laboratory Improvement Amendments CLIA as qualified to perform high complexity clinical laboratory testing.    * ABUNDANT ESCHERICHIA COLI     Radiology Studies: IR Angiogram Visceral Selective Result Date: 03/10/2024 INDICATION: 78 year old male with history of intraperitoneal hemorrhage status post percutaneous cholecystostomy tube placement with persistent anemia despite transfusion. EXAM: SELECTIVE VISCERAL ARTERIOGRAPHY; THORACIC AORTOGRAPHY; ADDITIONAL ARTERIOGRAPHY; IR ULTRASOUND GUIDANCE VASC ACCESS RIGHT; IR EXCHANGE BILARY DRAIN MEDICATIONS: None. ANESTHESIA/SEDATION: Moderate (conscious) sedation was employed during this procedure. A total of Versed  2 mg and Fentanyl  100 mcg was administered intravenously. Moderate Sedation Time: 55 minutes. The patient's level of consciousness and vital signs were monitored continuously by radiology nursing throughout the procedure under my direct supervision. CONTRAST:  80mL OMNIPAQUE  IOHEXOL  300 MG/ML SOLN, 20mL OMNIPAQUE  IOHEXOL  300 MG/ML  SOLN, <See Chart> OMNIPAQUE  IOHEXOL  300 MG/ML SOLN, 70mL OMNIPAQUE  IOHEXOL  300 MG/ML SOLN FLUOROSCOPY: Radiation Exposure Index (as provided by the fluoroscopic device): 1,170 mGy reference air Kerma COMPLICATIONS: None immediate. PROCEDURE: Informed consent was obtained from the patient following  explanation of the procedure, risks, benefits and alternatives. The patient understands, agrees and consents for the procedure. All questions were addressed. A time out was performed prior to the initiation of the procedure. Maximal barrier sterile technique utilized including caps, mask, sterile gowns, sterile gloves, large sterile drape, hand hygiene, and Betadine  prep. Preprocedure ultrasound evaluation demonstrated patency of the right common femoral artery. The procedure was planned. Subdermal Local anesthesia was administered at the planned needle entry site with 1% lidocaine . A small skin nick was made. Under direct ultrasound visualization, a 20 gauge micropuncture needle was directed into the right common femoral artery. A permanent ultrasound image was captured and stored in the record. A J wire was then directed to the proximal abdominal aorta over which the micropuncture sheath was exchanged for a 5 French vascular sheath. A C2 catheter was then introduced and used to select the celiac trunk. Dedicated celiac angiogram demonstrated patency of the proximal branches including the splenic, left gastric, and common hepatic arteries. The common hepatic artery was then selected. Dedicated angiogram from this location demonstrated patency of the common, gastroduodenal, proper, left, and right hepatic arteries without evidence of active extravasation. Specifically, there is no evidence of extravasation from the cystic artery or about the hepatic capsule. Attention was then turned toward the indwelling percutaneous cholecystostomy tube. The surrounding skin was anesthetized locally with 1% lidocaine . The retention suture was cut. The external portion of the drain was cut to release inter pigtail. An Amplatz wire was inserted and coiled within the gallbladder lumen. The drain was removed over the wire. Upon removal, there was immediate and voluminous dark, venous appearing hemorrhage from the skin entry site.  Repeat angiogram from the proper hepatic artery demonstrated similar findings to previous with no evidence of arterial extravasation about the established percutaneous cholecystostomy tract. A 12 French pigtail drainage catheter was then prepared and quickly exchanged over the wire with the pigtail portion then formed in the gallbladder lumen. Contrast was injected via the drain which demonstrated multiple mobile filling defects compatible with cholelithiasis. There is faint opacification of the peripheral cystic duct. The common bile duct was not visualized. A 2.4 French Progreat microcatheter and fathom 14 microwire were then used to select the cystic artery. Dedicated angiogram from the cystic artery demonstrated no evidence of active extravasation into the gallbladder lumen. Attention was then turned toward investigating the right T9 intercostal artery. The indwelling catheter was exchanged for a flush catheter was was directed to the midthoracic aorta and thoracic angiogram was obtained. Thoracic angiogram was significant for patency and tortuosity of the visualized thoracoabdominal aorta without evidence of stenosis. The T9-T10 intercostal arteries were visualized without evidence of active extravasation about the catheter transgression site. A Mickelson catheter was directed to the distal thoracic aorta and attempted to be used to cannulate the right T9 intercostal artery which was unsuccessful. Next, a C2 catheter was attempted at this location which was partially successful at cannulating the ostium, however established access was unable to be secured despite multiple attempts. The procedure was then aborted. The catheters were removed. The right common femoral artery access was closed with a 5 French Mynx device was deployed successfully without complication. Peripheral pulses were unchanged. The patient tolerated the procedure well was transferred  back to floor in good condition. IMPRESSION: 1.  Technically successful percutaneous cholecystostomy tube exchange and upsize to 12 French. Cholecystogram is significant for cholelithiasis and persistent cystic duct obstruction. 2. Upon removal of the indwelling 10 French percutaneous cholecystostomy tube, there was brisk venous appearing bleeding from the skin entry site. This terminated after up sizing. 3. Hepatic and thoracic angiography demonstrate no evidence of active extravasation about the gallbladder, hepatic capsule, or catheter entry site about the right mid axillary thorax. Ester Sides, MD [facility] Radiology Electronically Signed   By: Ester Sides M.D.   On: 03/10/2024 19:48   IR Aorta/Thoracic Result Date: 03/10/2024 INDICATION: 78 year old male with history of intraperitoneal hemorrhage status post percutaneous cholecystostomy tube placement with persistent anemia despite transfusion. EXAM: SELECTIVE VISCERAL ARTERIOGRAPHY; THORACIC AORTOGRAPHY; ADDITIONAL ARTERIOGRAPHY; IR ULTRASOUND GUIDANCE VASC ACCESS RIGHT; IR EXCHANGE BILARY DRAIN MEDICATIONS: None. ANESTHESIA/SEDATION: Moderate (conscious) sedation was employed during this procedure. A total of Versed  2 mg and Fentanyl  100 mcg was administered intravenously. Moderate Sedation Time: 55 minutes. The patient's level of consciousness and vital signs were monitored continuously by radiology nursing throughout the procedure under my direct supervision. CONTRAST:  80mL OMNIPAQUE  IOHEXOL  300 MG/ML SOLN, 20mL OMNIPAQUE  IOHEXOL  300 MG/ML SOLN, <See Chart> OMNIPAQUE  IOHEXOL  300 MG/ML SOLN, 70mL OMNIPAQUE  IOHEXOL  300 MG/ML SOLN FLUOROSCOPY: Radiation Exposure Index (as provided by the fluoroscopic device): 1,170 mGy reference air Kerma COMPLICATIONS: None immediate. PROCEDURE: Informed consent was obtained from the patient following explanation of the procedure, risks, benefits and alternatives. The patient understands, agrees and consents for the  procedure. All questions were addressed. A time out was performed prior to the initiation of the procedure. Maximal barrier sterile technique utilized including caps, mask, sterile gowns, sterile gloves, large sterile drape, hand hygiene, and Betadine  prep. Preprocedure ultrasound evaluation demonstrated patency of the right common femoral artery. The procedure was planned. Subdermal Local anesthesia was administered at the planned needle entry site with 1% lidocaine . A small skin nick was made. Under direct ultrasound visualization, a 20 gauge micropuncture needle was directed into the right common femoral artery. A permanent ultrasound image was captured and stored in the record. A J wire was then directed to the proximal abdominal aorta over which the micropuncture sheath was exchanged for a 5 French vascular sheath. A C2 catheter was then introduced and used to select the celiac trunk. Dedicated celiac angiogram demonstrated patency of the proximal branches including the splenic, left gastric, and common hepatic arteries. The common hepatic artery was then selected. Dedicated angiogram from this location demonstrated patency of the common, gastroduodenal, proper, left, and right hepatic arteries without evidence of active extravasation. Specifically, there is no evidence of extravasation from the cystic artery or about the hepatic capsule. Attention was then turned toward the indwelling percutaneous cholecystostomy tube. The surrounding skin was anesthetized locally with 1% lidocaine . The retention suture was cut. The external portion of the drain was cut to release inter pigtail. An Amplatz wire was inserted and coiled within the gallbladder lumen. The drain was removed over the wire. Upon removal, there was immediate and voluminous dark, venous appearing hemorrhage from the skin entry site. Repeat angiogram from the proper hepatic artery demonstrated similar findings to previous with no evidence of arterial  extravasation about the established percutaneous cholecystostomy tract. A 12 French pigtail drainage catheter was then prepared and quickly exchanged over the wire with the pigtail portion then formed in the gallbladder lumen. Contrast was injected via the drain which demonstrated  multiple mobile filling defects compatible with cholelithiasis. There is faint opacification of the peripheral cystic duct. The common bile duct was not visualized. A 2.4 French Progreat microcatheter and fathom 14 microwire were then used to select the cystic artery. Dedicated angiogram from the cystic artery demonstrated no evidence of active extravasation into the gallbladder lumen. Attention was then turned toward investigating the right T9 intercostal artery. The indwelling catheter was exchanged for a flush catheter was was directed to the midthoracic aorta and thoracic angiogram was obtained. Thoracic angiogram was significant for patency and tortuosity of the visualized thoracoabdominal aorta without evidence of stenosis. The T9-T10 intercostal arteries were visualized without evidence of active extravasation about the catheter transgression site. A Mickelson catheter was directed to the distal thoracic aorta and attempted to be used to cannulate the right T9 intercostal artery which was unsuccessful. Next, a C2 catheter was attempted at this location which was partially successful at cannulating the ostium, however established access was unable to be secured despite multiple attempts. The procedure was then aborted. The catheters were removed. The right common femoral artery access was closed with a 5 French Mynx device was deployed successfully without complication. Peripheral pulses were unchanged. The patient tolerated the procedure well was transferred back to floor in good condition. IMPRESSION: 1. Technically successful percutaneous cholecystostomy tube exchange and upsize to 12 French. Cholecystogram is significant for  cholelithiasis and persistent cystic duct obstruction. 2. Upon removal of the indwelling 10 French percutaneous cholecystostomy tube, there was brisk venous appearing bleeding from the skin entry site. This terminated after up sizing. 3. Hepatic and thoracic angiography demonstrate no evidence of active extravasation about the gallbladder, hepatic capsule, or catheter entry site about the right mid axillary thorax. Ester Sides, MD [facility] Radiology Electronically Signed   By: Ester Sides M.D.   On: 03/10/2024 19:48   IR US  Guide Vasc Access Right Result Date: 03/10/2024 INDICATION: 78 year old male with history of intraperitoneal hemorrhage status post percutaneous cholecystostomy tube placement with persistent anemia despite transfusion. EXAM: SELECTIVE VISCERAL ARTERIOGRAPHY; THORACIC AORTOGRAPHY; ADDITIONAL ARTERIOGRAPHY; IR ULTRASOUND GUIDANCE VASC ACCESS RIGHT; IR EXCHANGE BILARY DRAIN MEDICATIONS: None. ANESTHESIA/SEDATION: Moderate (conscious) sedation was employed during this procedure. A total of Versed  2 mg and Fentanyl  100 mcg was administered intravenously. Moderate Sedation Time: 55 minutes. The patient's level of consciousness and vital signs were monitored continuously by radiology nursing throughout the procedure under my direct supervision. CONTRAST:  80mL OMNIPAQUE  IOHEXOL  300 MG/ML SOLN, 20mL OMNIPAQUE  IOHEXOL  300 MG/ML SOLN, <See Chart> OMNIPAQUE  IOHEXOL  300 MG/ML SOLN, 70mL OMNIPAQUE  IOHEXOL  300 MG/ML SOLN FLUOROSCOPY: Radiation Exposure Index (as provided by the fluoroscopic device): 1,170 mGy reference air Kerma COMPLICATIONS: None immediate. PROCEDURE: Informed consent was obtained from the patient following explanation of the procedure, risks, benefits and alternatives. The patient understands, agrees and consents for the procedure. All questions were addressed. A time out was performed prior to the initiation of the procedure.  Maximal barrier sterile technique utilized including caps, mask, sterile gowns, sterile gloves, large sterile drape, hand hygiene, and Betadine  prep. Preprocedure ultrasound evaluation demonstrated patency of the right common femoral artery. The procedure was planned. Subdermal Local anesthesia was administered at the planned needle entry site with 1% lidocaine . A small skin nick was made. Under direct ultrasound visualization, a 20 gauge micropuncture needle was directed into the right common femoral artery. A permanent ultrasound image was captured and stored in the record. A J wire was then directed to the proximal abdominal  aorta over which the micropuncture sheath was exchanged for a 5 French vascular sheath. A C2 catheter was then introduced and used to select the celiac trunk. Dedicated celiac angiogram demonstrated patency of the proximal branches including the splenic, left gastric, and common hepatic arteries. The common hepatic artery was then selected. Dedicated angiogram from this location demonstrated patency of the common, gastroduodenal, proper, left, and right hepatic arteries without evidence of active extravasation. Specifically, there is no evidence of extravasation from the cystic artery or about the hepatic capsule. Attention was then turned toward the indwelling percutaneous cholecystostomy tube. The surrounding skin was anesthetized locally with 1% lidocaine . The retention suture was cut. The external portion of the drain was cut to release inter pigtail. An Amplatz wire was inserted and coiled within the gallbladder lumen. The drain was removed over the wire. Upon removal, there was immediate and voluminous dark, venous appearing hemorrhage from the skin entry site. Repeat angiogram from the proper hepatic artery demonstrated similar findings to previous with no evidence of arterial extravasation about the established percutaneous cholecystostomy tract. A 12 French pigtail drainage catheter  was then prepared and quickly exchanged over the wire with the pigtail portion then formed in the gallbladder lumen. Contrast was injected via the drain which demonstrated multiple mobile filling defects compatible with cholelithiasis. There is faint opacification of the peripheral cystic duct. The common bile duct was not visualized. A 2.4 French Progreat microcatheter and fathom 14 microwire were then used to select the cystic artery. Dedicated angiogram from the cystic artery demonstrated no evidence of active extravasation into the gallbladder lumen. Attention was then turned toward investigating the right T9 intercostal artery. The indwelling catheter was exchanged for a flush catheter was was directed to the midthoracic aorta and thoracic angiogram was obtained. Thoracic angiogram was significant for patency and tortuosity of the visualized thoracoabdominal aorta without evidence of stenosis. The T9-T10 intercostal arteries were visualized without evidence of active extravasation about the catheter transgression site. A Mickelson catheter was directed to the distal thoracic aorta and attempted to be used to cannulate the right T9 intercostal artery which was unsuccessful. Next, a C2 catheter was attempted at this location which was partially successful at cannulating the ostium, however established access was unable to be secured despite multiple attempts. The procedure was then aborted. The catheters were removed. The right common femoral artery access was closed with a 5 French Mynx device was deployed successfully without complication. Peripheral pulses were unchanged. The patient tolerated the procedure well was transferred back to floor in good condition. IMPRESSION: 1. Technically successful percutaneous cholecystostomy tube exchange and upsize to 12 French. Cholecystogram is significant for cholelithiasis and persistent cystic duct obstruction. 2. Upon removal of the indwelling 10 French percutaneous  cholecystostomy tube, there was brisk venous appearing bleeding from the skin entry site. This terminated after up sizing. 3. Hepatic and thoracic angiography demonstrate no evidence of active extravasation about the gallbladder, hepatic capsule, or catheter entry site about the right mid axillary thorax. Ester Sides, MD [facility] Radiology Electronically Signed   By: Ester Sides M.D.   On: 03/10/2024 19:48   IR Angiogram Selective Each Additional Vessel Result Date: 03/10/2024 INDICATION: 78 year old male with history of intraperitoneal hemorrhage status post percutaneous cholecystostomy tube placement with persistent anemia despite transfusion. EXAM: SELECTIVE VISCERAL ARTERIOGRAPHY; THORACIC AORTOGRAPHY; ADDITIONAL ARTERIOGRAPHY; IR ULTRASOUND GUIDANCE VASC ACCESS RIGHT; IR EXCHANGE BILARY DRAIN MEDICATIONS: None. ANESTHESIA/SEDATION: Moderate (conscious) sedation was employed during this procedure. A total  of Versed  2 mg and Fentanyl  100 mcg was administered intravenously. Moderate Sedation Time: 55 minutes. The patient's level of consciousness and vital signs were monitored continuously by radiology nursing throughout the procedure under my direct supervision. CONTRAST:  80mL OMNIPAQUE  IOHEXOL  300 MG/ML SOLN, 20mL OMNIPAQUE  IOHEXOL  300 MG/ML SOLN, <See Chart> OMNIPAQUE  IOHEXOL  300 MG/ML SOLN, 70mL OMNIPAQUE  IOHEXOL  300 MG/ML SOLN FLUOROSCOPY: Radiation Exposure Index (as provided by the fluoroscopic device): 1,170 mGy reference air Kerma COMPLICATIONS: None immediate. PROCEDURE: Informed consent was obtained from the patient following explanation of the procedure, risks, benefits and alternatives. The patient understands, agrees and consents for the procedure. All questions were addressed. A time out was performed prior to the initiation of the procedure. Maximal barrier sterile technique utilized including caps, mask, sterile gowns, sterile gloves,  large sterile drape, hand hygiene, and Betadine  prep. Preprocedure ultrasound evaluation demonstrated patency of the right common femoral artery. The procedure was planned. Subdermal Local anesthesia was administered at the planned needle entry site with 1% lidocaine . A small skin nick was made. Under direct ultrasound visualization, a 20 gauge micropuncture needle was directed into the right common femoral artery. A permanent ultrasound image was captured and stored in the record. A J wire was then directed to the proximal abdominal aorta over which the micropuncture sheath was exchanged for a 5 French vascular sheath. A C2 catheter was then introduced and used to select the celiac trunk. Dedicated celiac angiogram demonstrated patency of the proximal branches including the splenic, left gastric, and common hepatic arteries. The common hepatic artery was then selected. Dedicated angiogram from this location demonstrated patency of the common, gastroduodenal, proper, left, and right hepatic arteries without evidence of active extravasation. Specifically, there is no evidence of extravasation from the cystic artery or about the hepatic capsule. Attention was then turned toward the indwelling percutaneous cholecystostomy tube. The surrounding skin was anesthetized locally with 1% lidocaine . The retention suture was cut. The external portion of the drain was cut to release inter pigtail. An Amplatz wire was inserted and coiled within the gallbladder lumen. The drain was removed over the wire. Upon removal, there was immediate and voluminous dark, venous appearing hemorrhage from the skin entry site. Repeat angiogram from the proper hepatic artery demonstrated similar findings to previous with no evidence of arterial extravasation about the established percutaneous cholecystostomy tract. A 12 French pigtail drainage catheter was then prepared and quickly exchanged over the wire with the pigtail portion then formed in  the gallbladder lumen. Contrast was injected via the drain which demonstrated multiple mobile filling defects compatible with cholelithiasis. There is faint opacification of the peripheral cystic duct. The common bile duct was not visualized. A 2.4 French Progreat microcatheter and fathom 14 microwire were then used to select the cystic artery. Dedicated angiogram from the cystic artery demonstrated no evidence of active extravasation into the gallbladder lumen. Attention was then turned toward investigating the right T9 intercostal artery. The indwelling catheter was exchanged for a flush catheter was was directed to the midthoracic aorta and thoracic angiogram was obtained. Thoracic angiogram was significant for patency and tortuosity of the visualized thoracoabdominal aorta without evidence of stenosis. The T9-T10 intercostal arteries were visualized without evidence of active extravasation about the catheter transgression site. A Mickelson catheter was directed to the distal thoracic aorta and attempted to be used to cannulate the right T9 intercostal artery which was unsuccessful. Next, a C2 catheter was attempted at this location which was partially successful at cannulating the ostium, however  established access was unable to be secured despite multiple attempts. The procedure was then aborted. The catheters were removed. The right common femoral artery access was closed with a 5 French Mynx device was deployed successfully without complication. Peripheral pulses were unchanged. The patient tolerated the procedure well was transferred back to floor in good condition. IMPRESSION: 1. Technically successful percutaneous cholecystostomy tube exchange and upsize to 12 French. Cholecystogram is significant for cholelithiasis and persistent cystic duct obstruction. 2. Upon removal of the indwelling 10 French percutaneous cholecystostomy tube, there was brisk venous appearing bleeding from the skin entry site. This  terminated after up sizing. 3. Hepatic and thoracic angiography demonstrate no evidence of active extravasation about the gallbladder, hepatic capsule, or catheter entry site about the right mid axillary thorax. Ester Sides, MD [facility] Radiology Electronically Signed   By: Ester Sides M.D.   On: 03/10/2024 19:48   IR EXCHANGE BILIARY DRAIN Result Date: 03/10/2024 INDICATION: 78 year old male with history of intraperitoneal hemorrhage status post percutaneous cholecystostomy tube placement with persistent anemia despite transfusion. EXAM: SELECTIVE VISCERAL ARTERIOGRAPHY; THORACIC AORTOGRAPHY; ADDITIONAL ARTERIOGRAPHY; IR ULTRASOUND GUIDANCE VASC ACCESS RIGHT; IR EXCHANGE BILARY DRAIN MEDICATIONS: None. ANESTHESIA/SEDATION: Moderate (conscious) sedation was employed during this procedure. A total of Versed  2 mg and Fentanyl  100 mcg was administered intravenously. Moderate Sedation Time: 55 minutes. The patient's level of consciousness and vital signs were monitored continuously by radiology nursing throughout the procedure under my direct supervision. CONTRAST:  80mL OMNIPAQUE  IOHEXOL  300 MG/ML SOLN, 20mL OMNIPAQUE  IOHEXOL  300 MG/ML SOLN, <See Chart> OMNIPAQUE  IOHEXOL  300 MG/ML SOLN, 70mL OMNIPAQUE  IOHEXOL  300 MG/ML SOLN FLUOROSCOPY: Radiation Exposure Index (as provided by the fluoroscopic device): 1,170 mGy reference air Kerma COMPLICATIONS: None immediate. PROCEDURE: Informed consent was obtained from the patient following explanation of the procedure, risks, benefits and alternatives. The patient understands, agrees and consents for the procedure. All questions were addressed. A time out was performed prior to the initiation of the procedure. Maximal barrier sterile technique utilized including caps, mask, sterile gowns, sterile gloves, large sterile drape, hand hygiene, and Betadine  prep. Preprocedure ultrasound evaluation demonstrated patency of the  right common femoral artery. The procedure was planned. Subdermal Local anesthesia was administered at the planned needle entry site with 1% lidocaine . A small skin nick was made. Under direct ultrasound visualization, a 20 gauge micropuncture needle was directed into the right common femoral artery. A permanent ultrasound image was captured and stored in the record. A J wire was then directed to the proximal abdominal aorta over which the micropuncture sheath was exchanged for a 5 French vascular sheath. A C2 catheter was then introduced and used to select the celiac trunk. Dedicated celiac angiogram demonstrated patency of the proximal branches including the splenic, left gastric, and common hepatic arteries. The common hepatic artery was then selected. Dedicated angiogram from this location demonstrated patency of the common, gastroduodenal, proper, left, and right hepatic arteries without evidence of active extravasation. Specifically, there is no evidence of extravasation from the cystic artery or about the hepatic capsule. Attention was then turned toward the indwelling percutaneous cholecystostomy tube. The surrounding skin was anesthetized locally with 1% lidocaine . The retention suture was cut. The external portion of the drain was cut to release inter pigtail. An Amplatz wire was inserted and coiled within the gallbladder lumen. The drain was removed over the wire. Upon removal, there was immediate and voluminous dark, venous appearing hemorrhage from the skin entry site. Repeat angiogram from the proper hepatic  artery demonstrated similar findings to previous with no evidence of arterial extravasation about the established percutaneous cholecystostomy tract. A 12 French pigtail drainage catheter was then prepared and quickly exchanged over the wire with the pigtail portion then formed in the gallbladder lumen. Contrast was injected via the drain which demonstrated multiple mobile filling defects  compatible with cholelithiasis. There is faint opacification of the peripheral cystic duct. The common bile duct was not visualized. A 2.4 French Progreat microcatheter and fathom 14 microwire were then used to select the cystic artery. Dedicated angiogram from the cystic artery demonstrated no evidence of active extravasation into the gallbladder lumen. Attention was then turned toward investigating the right T9 intercostal artery. The indwelling catheter was exchanged for a flush catheter was was directed to the midthoracic aorta and thoracic angiogram was obtained. Thoracic angiogram was significant for patency and tortuosity of the visualized thoracoabdominal aorta without evidence of stenosis. The T9-T10 intercostal arteries were visualized without evidence of active extravasation about the catheter transgression site. A Mickelson catheter was directed to the distal thoracic aorta and attempted to be used to cannulate the right T9 intercostal artery which was unsuccessful. Next, a C2 catheter was attempted at this location which was partially successful at cannulating the ostium, however established access was unable to be secured despite multiple attempts. The procedure was then aborted. The catheters were removed. The right common femoral artery access was closed with a 5 French Mynx device was deployed successfully without complication. Peripheral pulses were unchanged. The patient tolerated the procedure well was transferred back to floor in good condition. IMPRESSION: 1. Technically successful percutaneous cholecystostomy tube exchange and upsize to 12 French. Cholecystogram is significant for cholelithiasis and persistent cystic duct obstruction. 2. Upon removal of the indwelling 10 French percutaneous cholecystostomy tube, there was brisk venous appearing bleeding from the skin entry site. This terminated after up sizing. 3. Hepatic and thoracic angiography demonstrate no evidence of active extravasation  about the gallbladder, hepatic capsule, or catheter entry site about the right mid axillary thorax. Ester Sides, MD [facility] Radiology Electronically Signed   By: Ester Sides M.D.   On: 03/10/2024 19:48    Scheduled Meds:  Chlorhexidine  Gluconate Cloth  6 each Topical Q0600   folic acid   1 mg Intravenous Daily   Followed by   NOREEN ON 03/15/2024] folic acid   1 mg Oral Daily   lidocaine   1 patch Transdermal Q24H   sodium chloride  flush  5 mL Intracatheter Q8H   Continuous Infusions:   LOS: 11 days   Norval Bar, MD  [facility]  03/12/2024, 10:24 AM   "

## 2024-03-12 NOTE — TOC Initial Note (Signed)
 Transition of Care [facility]) - Initial/Assessment Note    Patient Details  Name: [name] MRN: [id number] Date of Birth: Aug 13, 1945  [facility] CM/SW Contact:    Marval Gell, RN Phone Number: 03/12/2024, 2:35 PM  Clinical Narrative:                   Spoke w patient at bedside. He is agreeable to [facility] services for nursing, and does not have a preference for provider. Referral made to [facility], waiting to hear back.  Lives at home w spouse, no ambulatory DME needs identified at this time, will also follow for potential home oxygen  Expected Discharge Plan: Home w Home Health Services Barriers to Discharge: Continued Medical Work up   Patient Goals and CMS Choice Patient states their goals for this hospitalization and ongoing recovery are:: to go home CMS Medicare.gov Compare Post Acute Care list provided to:: Patient Choice offered to / list presented to : Patient      Expected Discharge Plan and Services   Discharge Planning Services: CM Consult Post Acute Care Choice: Home Health Living arrangements for the past 2 months: Single Family Home                           HH Arranged: RN HH Agency: [facility] Date [facility] Agency Contacted: 03/12/24 Time HH Agency Contacted: 1434 Representative spoke with at [facility] Agency: Amy  Prior Living Arrangements/Services Living arrangements for the past 2 months: Single Family Home                     Activities of Daily Living   ADL Screening (condition at time of admission) Independently performs ADLs?: Yes (appropriate for developmental age) Is the patient deaf or have difficulty hearing?: No Does the patient have difficulty seeing, even when wearing glasses/contacts?: No Does the patient have difficulty concentrating, remembering, or making decisions?: No  Permission Sought/Granted                  Emotional Assessment              Admission diagnosis:  Acute cholecystitis  [K81.0] Hypokalemia [E87.6] AKI (acute kidney injury) [N17.9] Acute renal failure, unspecified acute renal failure type [N17.9] Patient Active Problem List   Diagnosis Date Noted   Hypotensive episode 03/08/2024   NSVT (nonsustained ventricular tachycardia) (HCC) 03/03/2024   PVC (premature ventricular contraction) 03/03/2024   Sepsis with acute renal failure (HCC) 03/03/2024   Thrombocytopenia 03/03/2024   AKI (acute kidney injury) 03/01/2024   Hypokalemia 03/01/2024   Acute cholecystitis 03/01/2024   Impacted cerumen of both ears 06/16/2023   Leg edema 04/20/2023   Primary hyperparathyroidism (HCC) s/p hyperparathyroidectomy 2024 11/04/2021   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   Hyperglycemia 10/24/2019   Unilateral primary osteoarthritis, right hip 10/23/2019   Erectile dysfunction 10/15/2018   Plantar wart 04/06/2018   Essential tremor 04/06/2018   Dyslipidemia 03/26/2018   Essential hypertension 03/23/2018   History of colon polyps 03/23/2018   History of peptic ulcer 03/23/2018   Low back pain 03/23/2018   PCP:  Kennyth Worth HERO, MD Pharmacy:   [id number] - [place], [place] - [address] [place] [address] Phone: [phone] Fax: [phone]  [facility] [id number] - , [place] - [address] [place] [address] Phone: [phone] Fax: [phone]  Social Drivers of Health (SDOH) Social History: SDOH Screenings   Food Insecurity: No Food Insecurity (03/01/2024)  Housing: Low Risk (03/01/2024)  Transportation Needs: No Transportation Needs (03/01/2024)  Utilities: Not At Risk (03/01/2024)  Depression (PHQ2-9): Low Risk (12/26/2023)  Financial Resource Strain: Low Risk (12/22/2023)  Physical Activity: Insufficiently Active (12/22/2023)  Social Connections: Socially Integrated (03/01/2024)  Stress: No Stress Concern Present (12/22/2023)  Tobacco Use: Medium Risk  (03/02/2024)   SDOH Interventions:     Readmission Risk Interventions    03/04/2024    3:15 PM  Readmission Risk Prevention Plan  Medication Screening Complete  Transportation Screening Complete

## 2024-03-12 NOTE — Plan of Care (Signed)
  Problem: Health Behavior/Discharge Planning: Goal: Ability to manage health-related needs will improve Outcome: Progressing   Problem: Clinical Measurements: Goal: Cardiovascular complication will be avoided Outcome: Progressing   Problem: Activity: Goal: Risk for activity intolerance will decrease Outcome: Progressing   

## 2024-03-12 NOTE — Progress Notes (Signed)
 " [facility] Progress Note    Assessment/ Plan:   AKI on CKD3a, oliguric -baseline Cr around 1.1-1.3 with AKI likely related to ATN - Currently on MWF schedule, no clear evidence of GFR recovery when increased urine output is promising, will order dialysis for tomorrow but arrange for check in first to see if possible to hold therapy. - Has had elevated bladder scan results but potentially related to hemoperitoneum, pelvic ultrasound pending to clarify  Anemia: Stable to improved values, CTM   Acute cholecystitis -per primary service. Surgery following -s/p choley drain 12/21 with IR, bloody drainage; tube exchanged 12/28   Sepsis -secondary to gastroenteritis +/- cholecystitis - Antibiotics per primary.  Now much improved   Left renal mass -recommend MRI with and without contrast as outpatient. If here longer than expected then can be done prior to discharge, will need to see urology if mass is suspicious for RCC.  Will try to have further GFR recovery prior to the gadolinium exposure   HTN -BP soft, holding  anti-HTNs   Thrombocytopenia -work up per primary mild, stable    Subjective:   HD yesterday with 1 L UF, 0.8 L UOP yesterday K4.0, BUN 30 Afebrile, blood pressures are stable    Objective:   BP 106/63 (BP Location: Right Arm)   Pulse 63   Temp 98.1 F (36.7 C) (Oral)   Resp 20   Ht 6' 1 (1.854 m)   Wt 95 kg   SpO2 93%   BMI 27.63 kg/m   Intake/Output Summary (Last 24 hours) at 03/12/2024 1221 Last data filed at 03/12/2024 0546 Gross per 24 hour  Intake 1015 ml  Output 1650 ml  Net -635 ml   Weight change:   Physical Exam: Gen: Lying in bed in no distress CVS: Normal rate, no rub Resp: Bilateral chest rise with no increased work of breathing Abd: +choley drain, moderately distended Ext: no edema Neuro: awake, alert GU: no foley  Imaging: IR Angiogram Visceral Selective Result Date: 03/10/2024 INDICATION: 78 year old male with  history of intraperitoneal hemorrhage status post percutaneous cholecystostomy tube placement with persistent anemia despite transfusion. EXAM: SELECTIVE VISCERAL ARTERIOGRAPHY; THORACIC AORTOGRAPHY; ADDITIONAL ARTERIOGRAPHY; IR ULTRASOUND GUIDANCE VASC ACCESS RIGHT; IR EXCHANGE BILARY DRAIN MEDICATIONS: None. ANESTHESIA/SEDATION: Moderate (conscious) sedation was employed during this procedure. A total of Versed  2 mg and Fentanyl  100 mcg was administered intravenously. Moderate Sedation Time: 55 minutes. The patient's level of consciousness and vital signs were monitored continuously by radiology nursing throughout the procedure under my direct supervision. CONTRAST:  80mL OMNIPAQUE  IOHEXOL  300 MG/ML SOLN, 20mL OMNIPAQUE  IOHEXOL  300 MG/ML SOLN, <See Chart> OMNIPAQUE  IOHEXOL  300 MG/ML SOLN, 70mL OMNIPAQUE  IOHEXOL  300 MG/ML SOLN FLUOROSCOPY: Radiation Exposure Index (as provided by the fluoroscopic device): 1,170 mGy reference air Kerma COMPLICATIONS: None immediate. PROCEDURE: Informed consent was obtained from the patient following explanation of the procedure, risks, benefits and alternatives. The patient understands, agrees and consents for the procedure. All questions were addressed. A time out was performed prior to the initiation of the procedure. Maximal barrier sterile technique utilized including caps, mask, sterile gowns, sterile gloves, large sterile drape, hand hygiene, and Betadine  prep. Preprocedure ultrasound evaluation demonstrated patency of the right common femoral artery. The procedure was planned. Subdermal Local anesthesia was administered at the planned needle entry site with 1% lidocaine . A small skin nick was made. Under direct ultrasound visualization, a 20 gauge micropuncture needle was directed into the right common femoral artery. A permanent ultrasound image was captured and  stored in the record. A J wire was then directed to the proximal abdominal aorta over which the micropuncture  sheath was exchanged for a 5 French vascular sheath. A C2 catheter was then introduced and used to select the celiac trunk. Dedicated celiac angiogram demonstrated patency of the proximal branches including the splenic, left gastric, and common hepatic arteries. The common hepatic artery was then selected. Dedicated angiogram from this location demonstrated patency of the common, gastroduodenal, proper, left, and right hepatic arteries without evidence of active extravasation. Specifically, there is no evidence of extravasation from the cystic artery or about the hepatic capsule. Attention was then turned toward the indwelling percutaneous cholecystostomy tube. The surrounding skin was anesthetized locally with 1% lidocaine . The retention suture was cut. The external portion of the drain was cut to release inter pigtail. An Amplatz wire was inserted and coiled within the gallbladder lumen. The drain was removed over the wire. Upon removal, there was immediate and voluminous dark, venous appearing hemorrhage from the skin entry site. Repeat angiogram from the proper hepatic artery demonstrated similar findings to previous with no evidence of arterial extravasation about the established percutaneous cholecystostomy tract. A 12 French pigtail drainage catheter was then prepared and quickly exchanged over the wire with the pigtail portion then formed in the gallbladder lumen. Contrast was injected via the drain which demonstrated multiple mobile filling defects compatible with cholelithiasis. There is faint opacification of the peripheral cystic duct. The common bile duct was not visualized. A 2.4 French Progreat microcatheter and fathom 14 microwire were then used to select the cystic artery. Dedicated angiogram from the cystic artery demonstrated no evidence of active extravasation into the gallbladder lumen. Attention was then turned toward investigating the right T9 intercostal artery. The indwelling catheter was  exchanged for a flush catheter was was directed to the midthoracic aorta and thoracic angiogram was obtained. Thoracic angiogram was significant for patency and tortuosity of the visualized thoracoabdominal aorta without evidence of stenosis. The T9-T10 intercostal arteries were visualized without evidence of active extravasation about the catheter transgression site. A Mickelson catheter was directed to the distal thoracic aorta and attempted to be used to cannulate the right T9 intercostal artery which was unsuccessful. Next, a C2 catheter was attempted at this location which was partially successful at cannulating the ostium, however established access was unable to be secured despite multiple attempts. The procedure was then aborted. The catheters were removed. The right common femoral artery access was closed with a 5 French Mynx device was deployed successfully without complication. Peripheral pulses were unchanged. The patient tolerated the procedure well was transferred back to floor in good condition. IMPRESSION: 1. Technically successful percutaneous cholecystostomy tube exchange and upsize to 12 French. Cholecystogram is significant for cholelithiasis and persistent cystic duct obstruction. 2. Upon removal of the indwelling 10 French percutaneous cholecystostomy tube, there was brisk venous appearing bleeding from the skin entry site. This terminated after up sizing. 3. Hepatic and thoracic angiography demonstrate no evidence of active extravasation about the gallbladder, hepatic capsule, or catheter entry site about the right mid axillary thorax. Ester Sides, MD [facility] Radiology Electronically Signed   By: Ester Sides M.D.   On: 03/10/2024 19:48   IR Aorta/Thoracic Result Date: 03/10/2024 INDICATION: 78 year old male with history of intraperitoneal hemorrhage status post percutaneous cholecystostomy tube placement with persistent anemia despite  transfusion. EXAM: SELECTIVE VISCERAL ARTERIOGRAPHY; THORACIC AORTOGRAPHY; ADDITIONAL ARTERIOGRAPHY; IR ULTRASOUND GUIDANCE VASC ACCESS RIGHT; IR EXCHANGE BILARY DRAIN MEDICATIONS: None.  ANESTHESIA/SEDATION: Moderate (conscious) sedation was employed during this procedure. A total of Versed  2 mg and Fentanyl  100 mcg was administered intravenously. Moderate Sedation Time: 55 minutes. The patient's level of consciousness and vital signs were monitored continuously by radiology nursing throughout the procedure under my direct supervision. CONTRAST:  80mL OMNIPAQUE  IOHEXOL  300 MG/ML SOLN, 20mL OMNIPAQUE  IOHEXOL  300 MG/ML SOLN, <See Chart> OMNIPAQUE  IOHEXOL  300 MG/ML SOLN, 70mL OMNIPAQUE  IOHEXOL  300 MG/ML SOLN FLUOROSCOPY: Radiation Exposure Index (as provided by the fluoroscopic device): 1,170 mGy reference air Kerma COMPLICATIONS: None immediate. PROCEDURE: Informed consent was obtained from the patient following explanation of the procedure, risks, benefits and alternatives. The patient understands, agrees and consents for the procedure. All questions were addressed. A time out was performed prior to the initiation of the procedure. Maximal barrier sterile technique utilized including caps, mask, sterile gowns, sterile gloves, large sterile drape, hand hygiene, and Betadine  prep. Preprocedure ultrasound evaluation demonstrated patency of the right common femoral artery. The procedure was planned. Subdermal Local anesthesia was administered at the planned needle entry site with 1% lidocaine . A small skin nick was made. Under direct ultrasound visualization, a 20 gauge micropuncture needle was directed into the right common femoral artery. A permanent ultrasound image was captured and stored in the record. A J wire was then directed to the proximal abdominal aorta over which the micropuncture sheath was exchanged for a 5 French vascular sheath. A C2 catheter was then introduced and used to select the celiac trunk.  Dedicated celiac angiogram demonstrated patency of the proximal branches including the splenic, left gastric, and common hepatic arteries. The common hepatic artery was then selected. Dedicated angiogram from this location demonstrated patency of the common, gastroduodenal, proper, left, and right hepatic arteries without evidence of active extravasation. Specifically, there is no evidence of extravasation from the cystic artery or about the hepatic capsule. Attention was then turned toward the indwelling percutaneous cholecystostomy tube. The surrounding skin was anesthetized locally with 1% lidocaine . The retention suture was cut. The external portion of the drain was cut to release inter pigtail. An Amplatz wire was inserted and coiled within the gallbladder lumen. The drain was removed over the wire. Upon removal, there was immediate and voluminous dark, venous appearing hemorrhage from the skin entry site. Repeat angiogram from the proper hepatic artery demonstrated similar findings to previous with no evidence of arterial extravasation about the established percutaneous cholecystostomy tract. A 12 French pigtail drainage catheter was then prepared and quickly exchanged over the wire with the pigtail portion then formed in the gallbladder lumen. Contrast was injected via the drain which demonstrated multiple mobile filling defects compatible with cholelithiasis. There is faint opacification of the peripheral cystic duct. The common bile duct was not visualized. A 2.4 French Progreat microcatheter and fathom 14 microwire were then used to select the cystic artery. Dedicated angiogram from the cystic artery demonstrated no evidence of active extravasation into the gallbladder lumen. Attention was then turned toward investigating the right T9 intercostal artery. The indwelling catheter was exchanged for a flush catheter was was directed to the midthoracic aorta and thoracic angiogram was obtained. Thoracic  angiogram was significant for patency and tortuosity of the visualized thoracoabdominal aorta without evidence of stenosis. The T9-T10 intercostal arteries were visualized without evidence of active extravasation about the catheter transgression site. A Mickelson catheter was directed to the distal thoracic aorta and attempted to be used to cannulate the right T9 intercostal artery which was unsuccessful. Next, a C2 catheter was attempted at  this location which was partially successful at cannulating the ostium, however established access was unable to be secured despite multiple attempts. The procedure was then aborted. The catheters were removed. The right common femoral artery access was closed with a 5 French Mynx device was deployed successfully without complication. Peripheral pulses were unchanged. The patient tolerated the procedure well was transferred back to floor in good condition. IMPRESSION: 1. Technically successful percutaneous cholecystostomy tube exchange and upsize to 12 French. Cholecystogram is significant for cholelithiasis and persistent cystic duct obstruction. 2. Upon removal of the indwelling 10 French percutaneous cholecystostomy tube, there was brisk venous appearing bleeding from the skin entry site. This terminated after up sizing. 3. Hepatic and thoracic angiography demonstrate no evidence of active extravasation about the gallbladder, hepatic capsule, or catheter entry site about the right mid axillary thorax. Ester Sides, MD [facility] Radiology Electronically Signed   By: Ester Sides M.D.   On: 03/10/2024 19:48   IR US  Guide Vasc Access Right Result Date: 03/10/2024 INDICATION: 78 year old male with history of intraperitoneal hemorrhage status post percutaneous cholecystostomy tube placement with persistent anemia despite transfusion. EXAM: SELECTIVE VISCERAL ARTERIOGRAPHY; THORACIC AORTOGRAPHY; ADDITIONAL ARTERIOGRAPHY; IR  ULTRASOUND GUIDANCE VASC ACCESS RIGHT; IR EXCHANGE BILARY DRAIN MEDICATIONS: None. ANESTHESIA/SEDATION: Moderate (conscious) sedation was employed during this procedure. A total of Versed  2 mg and Fentanyl  100 mcg was administered intravenously. Moderate Sedation Time: 55 minutes. The patient's level of consciousness and vital signs were monitored continuously by radiology nursing throughout the procedure under my direct supervision. CONTRAST:  80mL OMNIPAQUE  IOHEXOL  300 MG/ML SOLN, 20mL OMNIPAQUE  IOHEXOL  300 MG/ML SOLN, <See Chart> OMNIPAQUE  IOHEXOL  300 MG/ML SOLN, 70mL OMNIPAQUE  IOHEXOL  300 MG/ML SOLN FLUOROSCOPY: Radiation Exposure Index (as provided by the fluoroscopic device): 1,170 mGy reference air Kerma COMPLICATIONS: None immediate. PROCEDURE: Informed consent was obtained from the patient following explanation of the procedure, risks, benefits and alternatives. The patient understands, agrees and consents for the procedure. All questions were addressed. A time out was performed prior to the initiation of the procedure. Maximal barrier sterile technique utilized including caps, mask, sterile gowns, sterile gloves, large sterile drape, hand hygiene, and Betadine  prep. Preprocedure ultrasound evaluation demonstrated patency of the right common femoral artery. The procedure was planned. Subdermal Local anesthesia was administered at the planned needle entry site with 1% lidocaine . A small skin nick was made. Under direct ultrasound visualization, a 20 gauge micropuncture needle was directed into the right common femoral artery. A permanent ultrasound image was captured and stored in the record. A J wire was then directed to the proximal abdominal aorta over which the micropuncture sheath was exchanged for a 5 French vascular sheath. A C2 catheter was then introduced and used to select the celiac trunk. Dedicated celiac angiogram demonstrated patency of the proximal branches including the splenic, left gastric,  and common hepatic arteries. The common hepatic artery was then selected. Dedicated angiogram from this location demonstrated patency of the common, gastroduodenal, proper, left, and right hepatic arteries without evidence of active extravasation. Specifically, there is no evidence of extravasation from the cystic artery or about the hepatic capsule. Attention was then turned toward the indwelling percutaneous cholecystostomy tube. The surrounding skin was anesthetized locally with 1% lidocaine . The retention suture was cut. The external portion of the drain was cut to release inter pigtail. An Amplatz wire was inserted and coiled within the gallbladder lumen. The drain was removed over the wire. Upon removal, there was immediate and voluminous dark, venous  appearing hemorrhage from the skin entry site. Repeat angiogram from the proper hepatic artery demonstrated similar findings to previous with no evidence of arterial extravasation about the established percutaneous cholecystostomy tract. A 12 French pigtail drainage catheter was then prepared and quickly exchanged over the wire with the pigtail portion then formed in the gallbladder lumen. Contrast was injected via the drain which demonstrated multiple mobile filling defects compatible with cholelithiasis. There is faint opacification of the peripheral cystic duct. The common bile duct was not visualized. A 2.4 French Progreat microcatheter and fathom 14 microwire were then used to select the cystic artery. Dedicated angiogram from the cystic artery demonstrated no evidence of active extravasation into the gallbladder lumen. Attention was then turned toward investigating the right T9 intercostal artery. The indwelling catheter was exchanged for a flush catheter was was directed to the midthoracic aorta and thoracic angiogram was obtained. Thoracic angiogram was significant for patency and tortuosity of the visualized thoracoabdominal aorta without evidence of  stenosis. The T9-T10 intercostal arteries were visualized without evidence of active extravasation about the catheter transgression site. A Mickelson catheter was directed to the distal thoracic aorta and attempted to be used to cannulate the right T9 intercostal artery which was unsuccessful. Next, a C2 catheter was attempted at this location which was partially successful at cannulating the ostium, however established access was unable to be secured despite multiple attempts. The procedure was then aborted. The catheters were removed. The right common femoral artery access was closed with a 5 French Mynx device was deployed successfully without complication. Peripheral pulses were unchanged. The patient tolerated the procedure well was transferred back to floor in good condition. IMPRESSION: 1. Technically successful percutaneous cholecystostomy tube exchange and upsize to 12 French. Cholecystogram is significant for cholelithiasis and persistent cystic duct obstruction. 2. Upon removal of the indwelling 10 French percutaneous cholecystostomy tube, there was brisk venous appearing bleeding from the skin entry site. This terminated after up sizing. 3. Hepatic and thoracic angiography demonstrate no evidence of active extravasation about the gallbladder, hepatic capsule, or catheter entry site about the right mid axillary thorax. Ester Sides, MD [facility] Radiology Electronically Signed   By: Ester Sides M.D.   On: 03/10/2024 19:48   IR Angiogram Selective Each Additional Vessel Result Date: 03/10/2024 INDICATION: 78 year old male with history of intraperitoneal hemorrhage status post percutaneous cholecystostomy tube placement with persistent anemia despite transfusion. EXAM: SELECTIVE VISCERAL ARTERIOGRAPHY; THORACIC AORTOGRAPHY; ADDITIONAL ARTERIOGRAPHY; IR ULTRASOUND GUIDANCE VASC ACCESS RIGHT; IR EXCHANGE BILARY DRAIN MEDICATIONS: None.  ANESTHESIA/SEDATION: Moderate (conscious) sedation was employed during this procedure. A total of Versed  2 mg and Fentanyl  100 mcg was administered intravenously. Moderate Sedation Time: 55 minutes. The patient's level of consciousness and vital signs were monitored continuously by radiology nursing throughout the procedure under my direct supervision. CONTRAST:  80mL OMNIPAQUE  IOHEXOL  300 MG/ML SOLN, 20mL OMNIPAQUE  IOHEXOL  300 MG/ML SOLN, <See Chart> OMNIPAQUE  IOHEXOL  300 MG/ML SOLN, 70mL OMNIPAQUE  IOHEXOL  300 MG/ML SOLN FLUOROSCOPY: Radiation Exposure Index (as provided by the fluoroscopic device): 1,170 mGy reference air Kerma COMPLICATIONS: None immediate. PROCEDURE: Informed consent was obtained from the patient following explanation of the procedure, risks, benefits and alternatives. The patient understands, agrees and consents for the procedure. All questions were addressed. A time out was performed prior to the initiation of the procedure. Maximal barrier sterile technique utilized including caps, mask, sterile gowns, sterile gloves, large sterile drape, hand hygiene, and Betadine  prep. Preprocedure ultrasound evaluation demonstrated patency of the right common  femoral artery. The procedure was planned. Subdermal Local anesthesia was administered at the planned needle entry site with 1% lidocaine . A small skin nick was made. Under direct ultrasound visualization, a 20 gauge micropuncture needle was directed into the right common femoral artery. A permanent ultrasound image was captured and stored in the record. A J wire was then directed to the proximal abdominal aorta over which the micropuncture sheath was exchanged for a 5 French vascular sheath. A C2 catheter was then introduced and used to select the celiac trunk. Dedicated celiac angiogram demonstrated patency of the proximal branches including the splenic, left gastric, and common hepatic arteries. The common hepatic artery was then selected.  Dedicated angiogram from this location demonstrated patency of the common, gastroduodenal, proper, left, and right hepatic arteries without evidence of active extravasation. Specifically, there is no evidence of extravasation from the cystic artery or about the hepatic capsule. Attention was then turned toward the indwelling percutaneous cholecystostomy tube. The surrounding skin was anesthetized locally with 1% lidocaine . The retention suture was cut. The external portion of the drain was cut to release inter pigtail. An Amplatz wire was inserted and coiled within the gallbladder lumen. The drain was removed over the wire. Upon removal, there was immediate and voluminous dark, venous appearing hemorrhage from the skin entry site. Repeat angiogram from the proper hepatic artery demonstrated similar findings to previous with no evidence of arterial extravasation about the established percutaneous cholecystostomy tract. A 12 French pigtail drainage catheter was then prepared and quickly exchanged over the wire with the pigtail portion then formed in the gallbladder lumen. Contrast was injected via the drain which demonstrated multiple mobile filling defects compatible with cholelithiasis. There is faint opacification of the peripheral cystic duct. The common bile duct was not visualized. A 2.4 French Progreat microcatheter and fathom 14 microwire were then used to select the cystic artery. Dedicated angiogram from the cystic artery demonstrated no evidence of active extravasation into the gallbladder lumen. Attention was then turned toward investigating the right T9 intercostal artery. The indwelling catheter was exchanged for a flush catheter was was directed to the midthoracic aorta and thoracic angiogram was obtained. Thoracic angiogram was significant for patency and tortuosity of the visualized thoracoabdominal aorta without evidence of stenosis. The T9-T10 intercostal arteries were visualized without evidence of  active extravasation about the catheter transgression site. A Mickelson catheter was directed to the distal thoracic aorta and attempted to be used to cannulate the right T9 intercostal artery which was unsuccessful. Next, a C2 catheter was attempted at this location which was partially successful at cannulating the ostium, however established access was unable to be secured despite multiple attempts. The procedure was then aborted. The catheters were removed. The right common femoral artery access was closed with a 5 French Mynx device was deployed successfully without complication. Peripheral pulses were unchanged. The patient tolerated the procedure well was transferred back to floor in good condition. IMPRESSION: 1. Technically successful percutaneous cholecystostomy tube exchange and upsize to 12 French. Cholecystogram is significant for cholelithiasis and persistent cystic duct obstruction. 2. Upon removal of the indwelling 10 French percutaneous cholecystostomy tube, there was brisk venous appearing bleeding from the skin entry site. This terminated after up sizing. 3. Hepatic and thoracic angiography demonstrate no evidence of active extravasation about the gallbladder, hepatic capsule, or catheter entry site about the right mid axillary thorax. Ester Sides, MD [facility] Electronically Signed   By: Ester Sides HERO.D.  On: 03/10/2024 19:48   IR EXCHANGE BILIARY DRAIN Result Date: 03/10/2024 INDICATION: 78 year old male with history of intraperitoneal hemorrhage status post percutaneous cholecystostomy tube placement with persistent anemia despite transfusion. EXAM: SELECTIVE VISCERAL ARTERIOGRAPHY; THORACIC AORTOGRAPHY; ADDITIONAL ARTERIOGRAPHY; IR ULTRASOUND GUIDANCE VASC ACCESS RIGHT; IR EXCHANGE BILARY DRAIN MEDICATIONS: None. ANESTHESIA/SEDATION: Moderate (conscious) sedation was employed during this procedure. A total of Versed  2 mg and  Fentanyl  100 mcg was administered intravenously. Moderate Sedation Time: 55 minutes. The patient's level of consciousness and vital signs were monitored continuously by radiology nursing throughout the procedure under my direct supervision. CONTRAST:  80mL OMNIPAQUE  IOHEXOL  300 MG/ML SOLN, 20mL OMNIPAQUE  IOHEXOL  300 MG/ML SOLN, <See Chart> OMNIPAQUE  IOHEXOL  300 MG/ML SOLN, 70mL OMNIPAQUE  IOHEXOL  300 MG/ML SOLN FLUOROSCOPY: Radiation Exposure Index (as provided by the fluoroscopic device): 1,170 mGy reference air Kerma COMPLICATIONS: None immediate. PROCEDURE: Informed consent was obtained from the patient following explanation of the procedure, risks, benefits and alternatives. The patient understands, agrees and consents for the procedure. All questions were addressed. A time out was performed prior to the initiation of the procedure. Maximal barrier sterile technique utilized including caps, mask, sterile gowns, sterile gloves, large sterile drape, hand hygiene, and Betadine  prep. Preprocedure ultrasound evaluation demonstrated patency of the right common femoral artery. The procedure was planned. Subdermal Local anesthesia was administered at the planned needle entry site with 1% lidocaine . A small skin nick was made. Under direct ultrasound visualization, a 20 gauge micropuncture needle was directed into the right common femoral artery. A permanent ultrasound image was captured and stored in the record. A J wire was then directed to the proximal abdominal aorta over which the micropuncture sheath was exchanged for a 5 French vascular sheath. A C2 catheter was then introduced and used to select the celiac trunk. Dedicated celiac angiogram demonstrated patency of the proximal branches including the splenic, left gastric, and common hepatic arteries. The common hepatic artery was then selected. Dedicated angiogram from this location demonstrated patency of the common, gastroduodenal, proper, left, and right hepatic  arteries without evidence of active extravasation. Specifically, there is no evidence of extravasation from the cystic artery or about the hepatic capsule. Attention was then turned toward the indwelling percutaneous cholecystostomy tube. The surrounding skin was anesthetized locally with 1% lidocaine . The retention suture was cut. The external portion of the drain was cut to release inter pigtail. An Amplatz wire was inserted and coiled within the gallbladder lumen. The drain was removed over the wire. Upon removal, there was immediate and voluminous dark, venous appearing hemorrhage from the skin entry site. Repeat angiogram from the proper hepatic artery demonstrated similar findings to previous with no evidence of arterial extravasation about the established percutaneous cholecystostomy tract. A 12 French pigtail drainage catheter was then prepared and quickly exchanged over the wire with the pigtail portion then formed in the gallbladder lumen. Contrast was injected via the drain which demonstrated multiple mobile filling defects compatible with cholelithiasis. There is faint opacification of the peripheral cystic duct. The common bile duct was not visualized. A 2.4 French Progreat microcatheter and fathom 14 microwire were then used to select the cystic artery. Dedicated angiogram from the cystic artery demonstrated no evidence of active extravasation into the gallbladder lumen. Attention was then turned toward investigating the right T9 intercostal artery. The indwelling catheter was exchanged for a flush catheter was was directed to the midthoracic aorta and thoracic angiogram was obtained. Thoracic angiogram was significant for patency and tortuosity of the visualized thoracoabdominal aorta without  evidence of stenosis. The T9-T10 intercostal arteries were visualized without evidence of active extravasation about the catheter transgression site. A Mickelson catheter was directed to the distal thoracic aorta  and attempted to be used to cannulate the right T9 intercostal artery which was unsuccessful. Next, a C2 catheter was attempted at this location which was partially successful at cannulating the ostium, however established access was unable to be secured despite multiple attempts. The procedure was then aborted. The catheters were removed. The right common femoral artery access was closed with a 5 French Mynx device was deployed successfully without complication. Peripheral pulses were unchanged. The patient tolerated the procedure well was transferred back to floor in good condition. IMPRESSION: 1. Technically successful percutaneous cholecystostomy tube exchange and upsize to 12 French. Cholecystogram is significant for cholelithiasis and persistent cystic duct obstruction. 2. Upon removal of the indwelling 10 French percutaneous cholecystostomy tube, there was brisk venous appearing bleeding from the skin entry site. This terminated after up sizing. 3. Hepatic and thoracic angiography demonstrate no evidence of active extravasation about the gallbladder, hepatic capsule, or catheter entry site about the right mid axillary thorax. Ester Sides, MD [facility] Electronically Signed   By: Ester Sides M.D.   On: 03/10/2024 19:48     Labs: BMET Recent Labs  Lab 03/06/24 0300 03/07/24 0500 03/08/24 1100 03/09/24 0452 03/10/24 0718 03/12/24 0600  NA 136 138 138 137 135 138  K 3.4* 3.3* 4.0 4.0 4.4 4.0  CL 102 102 102 102 99 101  CO2 22 24 24 23 25 29   GLUCOSE 110* 105* 124* 92 102* 98  BUN 70* 50* 61* 66* 41* 30*  CREATININE 6.23* 4.97* 6.17* 6.45* 4.70* 3.24*  CALCIUM  7.7* 7.7* 7.6* 7.5* 7.4* 7.6*  PHOS 4.1 3.4 5.6*  --   --   --    CBC Recent Labs  Lab 03/07/24 0500 03/08/24 1058 03/10/24 2352 03/11/24 0548 03/11/24 1748 03/12/24 0600  WBC 8.1   < > 10.8* 10.4 13.0* 10.0  NEUTROABS 6.3  --   --   --   --   --   HGB 11.0*    < > 7.3* 7.0* 8.3* 8.1*  HCT 32.3*   < > 21.6* 20.7* 25.1* 24.8*  MCV 85.9   < > 87.8 90.0 91.6 91.9  PLT 88*   < > 105* 109* 111* 109*   < > = values in this interval not displayed.    Medications:     Chlorhexidine  Gluconate Cloth  6 each Topical Q0600   folic acid   1 mg Intravenous Daily   Followed by   NOREEN ON 03/15/2024] folic acid   1 mg Oral Daily   heparin   5,000 Units Subcutaneous Q8H   lidocaine   1 patch Transdermal Q24H   sodium chloride  flush  5 mL Intracatheter Q8H      "

## 2024-03-13 DIAGNOSIS — N179 Acute kidney failure, unspecified: Secondary | ICD-10-CM | POA: Diagnosis not present

## 2024-03-13 LAB — CBC
HCT: 23.9 % — ABNORMAL LOW (ref 39.0–52.0)
HCT: 26.9 % — ABNORMAL LOW (ref 39.0–52.0)
Hemoglobin: 7.9 g/dL — ABNORMAL LOW (ref 13.0–17.0)
Hemoglobin: 8.7 g/dL — ABNORMAL LOW (ref 13.0–17.0)
MCH: 30.5 pg (ref 26.0–34.0)
MCH: 30.5 pg (ref 26.0–34.0)
MCHC: 33.1 g/dL (ref 30.0–36.0)
MCHC: 32.3 g/dL (ref 30.0–36.0)
MCV: 92.3 fL (ref 80.0–100.0)
MCV: 94.4 fL (ref 80.0–100.0)
Platelets: 111 K/uL — ABNORMAL LOW (ref 150–400)
Platelets: 126 K/uL — ABNORMAL LOW (ref 150–400)
RBC: 2.59 MIL/uL — ABNORMAL LOW (ref 4.22–5.81)
RBC: 2.85 MIL/uL — ABNORMAL LOW (ref 4.22–5.81)
RDW: 15.5 % (ref 11.5–15.5)
RDW: 15.5 % (ref 11.5–15.5)
WBC: 8.3 K/uL (ref 4.0–10.5)
WBC: 10.1 K/uL (ref 4.0–10.5)
nRBC: 0 % (ref 0.0–0.2)
nRBC: 0 % (ref 0.0–0.2)

## 2024-03-13 LAB — BPAM RBC
Blood Product Expiration Date: 202601232359
Blood Product Expiration Date: 202601232359
Blood Product Expiration Date: 202601232359
Blood Product Expiration Date: 202601232359
Blood Product Expiration Date: 202601242359
Blood Product Expiration Date: 202601242359
Blood Product Expiration Date: 202601242359
Blood Product Expiration Date: 202601242359
Blood Product Expiration Date: 202601262359
ISSUE DATE / TIME: 202512261358
ISSUE DATE / TIME: 202512261627
ISSUE DATE / TIME: 202512270832
ISSUE DATE / TIME: 202512271017
ISSUE DATE / TIME: 202512280243
ISSUE DATE / TIME: 202512280810
ISSUE DATE / TIME: 202512281016
ISSUE DATE / TIME: 202512291027
ISSUE DATE / TIME: 202512301526
Unit Type and Rh: 5100
Unit Type and Rh: 5100
Unit Type and Rh: 5100
Unit Type and Rh: 5100
Unit Type and Rh: 5100
Unit Type and Rh: 5100
Unit Type and Rh: 5100
Unit Type and Rh: 5100
Unit Type and Rh: 5100

## 2024-03-13 LAB — TYPE AND SCREEN
ABO/RH(D): O POS
Antibody Screen: NEGATIVE
Unit division: 0
Unit division: 0
Unit division: 0
Unit division: 0
Unit division: 0
Unit division: 0
Unit division: 0
Unit division: 0
Unit division: 0

## 2024-03-13 LAB — HEPATIC FUNCTION PANEL
ALT: 20 U/L (ref 0–44)
AST: 35 U/L (ref 15–41)
Albumin: 2.8 g/dL — ABNORMAL LOW (ref 3.5–5.0)
Alkaline Phosphatase: 79 U/L (ref 38–126)
Bilirubin, Direct: 0.6 mg/dL — ABNORMAL HIGH (ref 0.0–0.2)
Indirect Bilirubin: 0.4 mg/dL (ref 0.3–0.9)
Total Bilirubin: 1 mg/dL (ref 0.0–1.2)
Total Protein: 5.3 g/dL — ABNORMAL LOW (ref 6.5–8.1)

## 2024-03-13 LAB — BASIC METABOLIC PANEL WITH GFR
Anion gap: 7 (ref 5–15)
BUN: 34 mg/dL — ABNORMAL HIGH (ref 8–23)
CO2: 30 mmol/L (ref 22–32)
Calcium: 7.7 mg/dL — ABNORMAL LOW (ref 8.9–10.3)
Chloride: 101 mmol/L (ref 98–111)
Creatinine, Ser: 3.2 mg/dL — ABNORMAL HIGH (ref 0.61–1.24)
GFR, Estimated: 19 mL/min — ABNORMAL LOW
Glucose, Bld: 116 mg/dL — ABNORMAL HIGH (ref 70–99)
Potassium: 3.8 mmol/L (ref 3.5–5.1)
Sodium: 137 mmol/L (ref 135–145)

## 2024-03-13 MED ORDER — SODIUM CHLORIDE 0.9% FLUSH: 10.0000 mL | INTRAVENOUS | Status: DC | PRN

## 2024-03-13 MED ORDER — SODIUM CHLORIDE 0.9% FLUSH
10.0000 mL | Freq: Two times a day (BID) | INTRAVENOUS | Status: DC
  Administered 2024-03-13 – 2024-03-15 (×6): 10 mL

## 2024-03-13 NOTE — Plan of Care (Signed)
  Problem: Pain Managment: Goal: General experience of comfort will improve and/or be controlled Outcome: Progressing   Problem: Safety: Goal: Ability to remain free from injury will improve Outcome: Progressing   Problem: Skin Integrity: Goal: Risk for impaired skin integrity will decrease Outcome: Progressing

## 2024-03-13 NOTE — Progress Notes (Signed)
" °  [facility] Progress Note    Assessment/ Plan:   AKI on CKD3a, oliguric -baseline Cr around 1.1-1.3 with AKI likely related to ATN - Appears to be recovering GFR with adequate UOP and stable BUN and creatinine.  Continue to watch for the next 24 to 48 hours.  No dialysis at the current time.  Anemia: Stable, CTM.   Acute cholecystitis -per primary service. Surgery following -s/p choley drain 12/21 with IR, bloody drainage; tube exchanged 12/28   Sepsis: resolved   Left renal mass -recommend MRI with and without contrast as outpatient. If here longer than expected then can be done prior to discharge, will need to see urology if mass is suspicious for RCC.  Will try to have further GFR recovery prior to the gadolinium exposure   Subjective:   Feels good, no complaints Creatinine and BUN essentially unchanged from yesterday, K3.8 At least 0.8 L UOP yesterday    Objective:   BP 121/62 (BP Location: Right Arm)   Pulse 97   Temp 98.3 F (36.8 C)   Resp 18   Ht 6' 1 (1.854 m)   Wt 95 kg   SpO2 94%   BMI 27.63 kg/m   Intake/Output Summary (Last 24 hours) at 03/13/2024 1141 Last data filed at 03/13/2024 1035 Gross per 24 hour  Intake 123 ml  Output 1995 ml  Net -1872 ml   Weight change:   Physical Exam: Gen: Lying in bed in no distress CVS: Normal rate, no rub Resp: Bilateral chest rise with no increased work of breathing Abd: +choley drain, moderately distended Ext: no edema Neuro: awake, alert GU: no foley  Imaging: No results found.    Labs: BMET Recent Labs  Lab 03/07/24 0500 03/08/24 1100 03/09/24 0452 03/10/24 0718 03/12/24 0600 03/13/24 0947  NA 138 138 137 135 138 137  K 3.3* 4.0 4.0 4.4 4.0 3.8  CL 102 102 102 99 101 101  CO2 24 24 23 25 29 30   GLUCOSE 105* 124* 92 102* 98 116*  BUN 50* 61* 66* 41* 30* 34*  CREATININE 4.97* 6.17* 6.45* 4.70* 3.24* 3.20*  CALCIUM  7.7* 7.6* 7.5* 7.4* 7.6* 7.7*  PHOS 3.4 5.6*  --   --   --    --    CBC Recent Labs  Lab 03/07/24 0500 03/08/24 1058 03/11/24 0548 03/11/24 1748 03/12/24 0600 03/13/24 0947  WBC 8.1   < > 10.4 13.0* 10.0 8.3  NEUTROABS 6.3  --   --   --   --   --   HGB 11.0*   < > 7.0* 8.3* 8.1* 7.9*  HCT 32.3*   < > 20.7* 25.1* 24.8* 23.9*  MCV 85.9   < > 90.0 91.6 91.9 92.3  PLT 88*   < > 109* 111* 109* 111*   < > = values in this interval not displayed.    Medications:     Chlorhexidine  Gluconate Cloth  6 each Topical Q0600   folic acid   1 mg Intravenous Daily   Followed by   NOREEN ON 03/15/2024] folic acid   1 mg Oral Daily   heparin   5,000 Units Subcutaneous Q8H   lidocaine   1 patch Transdermal Q24H   sodium chloride  flush  10-40 mL Intracatheter Q12H   sodium chloride  flush  5 mL Intracatheter Q8H      "

## 2024-03-13 NOTE — Evaluation (Signed)
 Physical Therapy Evaluation Patient Details Name: [name] MRN: [id number] DOB: February 21, 1946 Today's Date: 03/13/2024  History of Present Illness  78 y.o. male presents to [facility] hospital on 03/01/2024 with nausea, vomiting and diarrhea. Pt admitted with AKI, hypokalemia and acute cholecystitis. Had to replace chole drain during stay and still has 1 chole drain in place.  PMH includes HTN, HLD, OA, RBBB.  Clinical Impression  Pt admitted with above diagnosis. Pt was able to ambulate with good balance with RW. Previous eval, pt was I without device. Will follow acutely as pt appears to be weaker than a few weeks ago. Should progress well.  Pt currently with functional limitations due to the deficits listed below (see PT Problem List). Pt will benefit from acute skilled PT to increase their independence and safety with mobility to allow discharge.           If plan is discharge home, recommend the following: Assist for transportation;Help with stairs or ramp for entrance   Can travel by private vehicle        Equipment Recommendations None recommended by PT  Recommendations for Other Services       Functional Status Assessment Patient has had a recent decline in their functional status and demonstrates the ability to make significant improvements in function in a reasonable and predictable amount of time.     Precautions / Restrictions Precautions Precautions: None Precaution/Restrictions Comments: chole drain Restrictions Weight Bearing Restrictions Per Provider Order: No      Mobility  Bed Mobility Overal bed mobility: Independent                  Transfers Overall transfer level: Independent                      Ambulation/Gait Ambulation/Gait assistance: Contact guard assist Gait Distance (Feet): 45 Feet Assistive device: Rolling walker (2 wheels) Gait Pattern/deviations: WFL(Within Functional Limits) Gait velocity: functional Gait velocity  interpretation: 1.31 - 2.62 ft/sec, indicative of limited community ambulator   General Gait Details: steady step-through gait however did rely on RW and needed 2LO2.  Stairs            Wheelchair Mobility     Tilt Bed    Modified Rankin (Stroke Patients Only)       Balance Overall balance assessment: Needs assistance         Standing balance support: Bilateral upper extremity supported, During functional activity, Reliant on assistive device for balance                                 Pertinent Vitals/Pain Pain Assessment Pain Assessment: No/denies pain    Home Living Family/patient expects to be discharged to:: Private residence Living Arrangements: Spouse/significant other Available Help at Discharge: Family;Available 24 hours/day Type of Home: House Home Access: Stairs to enter Entrance Stairs-Rails: Left Entrance Stairs-Number of Steps: 5   Home Layout: One level Home Equipment: Pharmacist, Hospital (2 wheels);Grab bars - toilet      Prior Function Prior Level of Function : Independent/Modified Independent;Driving                     Extremity/Trunk Assessment   Upper Extremity Assessment Upper Extremity Assessment: Defer to OT evaluation    Lower Extremity Assessment Lower Extremity Assessment: Overall WFL for tasks assessed    Cervical / Trunk Assessment Cervical / Trunk Assessment: Normal  Communication   Communication Communication: No apparent difficulties    Cognition Arousal: Alert Behavior During Therapy: WFL for tasks assessed/performed   PT - Cognitive impairments: No apparent impairments                         Following commands: Intact       Cueing Cueing Techniques: Verbal cues     General Comments General comments (skin integrity, edema, etc.): Pt requires 2LO2 to keep sats >90%.    Exercises General Exercises - Lower Extremity Long Arc Quad: AROM, Both, 10 reps, Seated    Assessment/Plan    PT Assessment Patient needs continued PT services  PT Problem List Decreased activity tolerance;Decreased balance;Decreased mobility;Decreased knowledge of use of DME;Decreased safety awareness;Decreased knowledge of precautions;Cardiopulmonary status limiting activity       PT Treatment Interventions DME instruction;Gait training;Stair training;Therapeutic activities;Functional mobility training;Therapeutic exercise;Balance training;Patient/family education    PT Goals (Current goals can be found in the Care Plan section)  Acute Rehab PT Goals Patient Stated Goal: to go home PT Goal Formulation: With patient Time For Goal Achievement: 03/27/24 Potential to Achieve Goals: Good    Frequency Min 2X/week     Co-evaluation               AM-PAC PT 6 Clicks Mobility  Outcome Measure Help needed turning from your back to your side while in a flat bed without using bedrails?: None Help needed moving from lying on your back to sitting on the side of a flat bed without using bedrails?: None Help needed moving to and from a bed to a chair (including a wheelchair)?: None Help needed standing up from a chair using your arms (e.g., wheelchair or bedside chair)?: None Help needed to walk in hospital room?: A Little Help needed climbing 3-5 steps with a railing? : A Little 6 Click Score: 22    End of Session Equipment Utilized During Treatment: Gait belt;Oxygen Activity Tolerance: Patient tolerated treatment well Patient left: with call bell/phone within reach;in chair;with chair alarm set Nurse Communication: Mobility status PT Visit Diagnosis: Other abnormalities of gait and mobility (R26.89)    Time: 1043-1101 PT Time Calculation (min) (ACUTE ONLY): 18 min   Charges:   PT Evaluation $PT Eval Moderate Complexity: 1 Mod   PT General Charges $$ ACUTE PT VISIT: 1 Visit         [name] M,PT Acute Rehab Services [phone]   [name] 03/13/2024, 1:03 PM

## 2024-03-13 NOTE — Plan of Care (Signed)
  Problem: Health Behavior/Discharge Planning: Goal: Ability to manage health-related needs will improve Outcome: Progressing   Problem: Clinical Measurements: Goal: Respiratory complications will improve Outcome: Progressing   Problem: Activity: Goal: Risk for activity intolerance will decrease Outcome: Progressing   

## 2024-03-13 NOTE — Progress Notes (Signed)
 [facility]  [name] FMW:[id number]  DOB: [birth year]  DOA: 03/01/2024  PCP: Kennyth Worth HERO, MD  03/13/2024,7:49 AM  LOS: 12 days    Code Status: Full code     from: Home   78 year old male HTN HLD RBBB reflux PVCs Primary hyperparathyroidism followed by endocrinologist Dr. Sam since 2022--- apparently underwent parathyroidectomy 2023 with Dr. Eletha 12/19 presented to [name], ED, NB diarrhea after frozen chicken meal profuse vomiting episodes loose stool-abdominal discomfort generalized-oliguric Admitted with hemodynamic AKI-(creatinine 6 on arrival) RUQ tenderness noted on deep palpation CT evidence of cholecystitis Admitted IV fluid repleted antibiotics started 12/20 general surgery consulted nephrology consulted and IR consulted because of severity of AKI/ATN  12/21-US  fluoroscopy cholecystectomy performed 12/23 right internal jugular trialysis 16 cm HD cath placed 12/26 hypotensive anemic-transfuse 2 units 12/27 transfuse 1 unit 12/28 hepatic thoracic angiogram performed-cholecystotomy tube exchange upsized to 12 French----transfuse 2 units 12/29 because of significant PVCs Zio patch mailed--- transfused 1 unit   Pertinent imaging/studies till date  Echocardiogram 12/22 EF 55-60% concentric LVH- 12/22-elevated right hemidiaphragm with right basilar opacity   Assessment  & Plan :    Hemoperitoneum with resulting acute blood loss anemia thrombocytopenia and hypotension occurring on 12/26 Low BP felt to be 2/2 biliary drain placement and biliary output Transfused 5 units so far hemoglobin 7.9 and platelets are responding some Acute blood loss anemia iron is 53 saturation is 21-borderline needs IV iron but would hold in the setting of drain/infection See below  Oliguric ATN with high gap acidosis--temp HD cath as above Minimize hypotension holding losartan  50 Toprol -XL 25 Cialis  -1.39 L since admission weight is up from 86-95 Weight patient standing as able  Hypoxic  respiratory failure previously Wean oxygen as able-manage volume also as able Would CT chest if becomes more hypoxic would avoid contrast however x-rays done show elevated right hemidiaphragm with patchy opacity right lung base atelectasis versus pneumonia on 12/22 Hold for now seems to be improved  Urinary retention-likely secondary to BPH- Bladder scan every shift to ensure not retaining-straight cath above 300  Sepsis secondary to acute cholecystitis on admission .  Drains as above growing E. coli-completed 5 days Zosyn  12/26-needs interval 6-week visit with general surgery for interval cholecystectomy Drain studies as per IR  PVC NSVT 12/21 Cardiology saw patient 12/21 echo within normal limits Previously was on amiodarone Toprol -XL and is on ZIO monitor right now last seen by cardiology 12/25  Left renal mass----needs outpatient renal mass protocol MR  Lactic acidosis-resolved Gastroenteritis-coincidental Transaminitis probably secondary to statin and sepsis from biliary source  Data Reviewed today: - 3.8 BUN/creatinine 34/3.2 gap 7 LFTs relatively normal Hemoglobin 7.9 platelet 111 WBC 8.3   DVT prophylaxis: SCDs   Dispo/Global plan: Inpatient     Subjective:   Awake coherent no distress looking fair--eating and drinking On oxygen which I took off Seems comfortable   Objective + exam Vitals:   03/12/24 1112 03/12/24 1507 03/12/24 1932 03/13/24 0435  BP: 106/63 121/68 (!) 112/48 121/62  Pulse: 63 (!) 51 80 97  Resp: 20 18 18 18   Temp: 98.1 F (36.7 C) 98 F (36.7 C) 98.9 F (37.2 C) 98.3 F (36.8 C)  TempSrc: Oral     SpO2: 93% 98% 95% 94%  Weight:      Height:       Filed Weights   03/10/24 0300 03/11/24 1349 03/11/24 1740  Weight: 92.2 kg 94.2 kg 95 kg     Examination:  Looks well  overall S1-S2 no murmur -Perc drain in place 12 French right lateral area Trialysis catheter in place--abdomen soft no rebound no guarding No lower extremity  edema   Scheduled Meds:  Chlorhexidine  Gluconate Cloth  6 each Topical Q0600   folic acid   1 mg Intravenous Daily   Followed by   NOREEN ON 03/15/2024] folic acid   1 mg Oral Daily   heparin   5,000 Units Subcutaneous Q8H   lidocaine   1 patch Transdermal Q24H   sodium chloride  flush  10-40 mL Intracatheter Q12H   sodium chloride  flush  5 mL Intracatheter Q8H   Continuous Infusions: acetaminophen  **OR** acetaminophen , albuterol , HYDROmorphone  (DILAUDID ) injection, iohexol , loperamide , ondansetron  (ZOFRAN ) IV, oxyCODONE   Time 57  Steven Grimes, MD  [facility]

## 2024-03-14 DIAGNOSIS — N179 Acute kidney failure, unspecified: Secondary | ICD-10-CM | POA: Diagnosis not present

## 2024-03-14 LAB — BASIC METABOLIC PANEL WITH GFR
Anion gap: 7 (ref 5–15)
BUN: 35 mg/dL — ABNORMAL HIGH (ref 8–23)
CO2: 30 mmol/L (ref 22–32)
Calcium: 7.8 mg/dL — ABNORMAL LOW (ref 8.9–10.3)
Chloride: 102 mmol/L (ref 98–111)
Creatinine, Ser: 2.95 mg/dL — ABNORMAL HIGH (ref 0.61–1.24)
GFR, Estimated: 21 mL/min — ABNORMAL LOW
Glucose, Bld: 97 mg/dL (ref 70–99)
Potassium: 3.9 mmol/L (ref 3.5–5.1)
Sodium: 139 mmol/L (ref 135–145)

## 2024-03-14 NOTE — Progress Notes (Signed)
 Mobility Specialist: Progress Note   03/14/24 1200  Mobility  Activity Ambulated with assistance  Level of Assistance Standby assist, set-up cues, supervision of patient - no hands on  Assistive Device None  Distance Ambulated (ft) 45 ft (room ambulation)  Activity Response Tolerated well  Mobility Referral Yes  Mobility visit 1 Mobility  Mobility Specialist Start Time (ACUTE ONLY) 0940  Mobility Specialist Stop Time (ACUTE ONLY) 1000  Mobility Specialist Time Calculation (min) (ACUTE ONLY) 20 min    Pt received in bed, agreeable to mobility session. Removed [place], SpO2 93% on RA. SV throughout w/o AD, no LOB but mild unsteadiness that improved with further ambulation. Requested to use the BR first, BM successful and pericare done ind. Declined hallway ambulation but completed two laps around the room without fault. No complaints during just a little SOB by EOS. SpO2 89% on RA, replaced 2LO2 [place] for comfort. Left on EOB with all needs met, call bell in reach.   Ileana Lute Mobility Specialist Please contact via SecureChat or Rehab office at [phone]

## 2024-03-14 NOTE — Progress Notes (Signed)
" °  [facility] Progress Note    Assessment/ Plan:   AKI on CKD3a, oliguric -baseline Cr around 1.1-1.3 with AKI likely related to ATN -Recovering GFR, no HD required - Will check AM Labs tomrrow and then can remove temp HD cath - He has elevated PVRs but this is due to the hemoperitoneum; pelvic US  had bladder volume < earlier this week. Recommend foley removal prior to discharge.  -  Will need f/u at [facility] upon discharge will begin process today.   Anemia: Stable, CTM.   Acute cholecystitis -per primary service. Surgery following -s/p choley drain 12/21 with IR, bloody drainage; tube exchanged 12/28   Sepsis: resolved   Left renal mass -recommend MRI with and without contrast as outpatient. If here longer than expected then can be done prior to discharge, will need to see urology if mass is suspicious for RCC.  Will try to have further GFR recovery prior to the gadolinium exposure   Subjective:   Feels good, no complaints Foley catheter placed yesterday PM for concern re bladder retention SCr down to 2.9 Has R internal jugular Temp hd cath.    Objective:   BP 136/65 (BP Location: Right Arm)   Pulse (!) 49   Temp 98 F (36.7 C)   Resp 18   Ht 6' 1 (1.854 m)   Wt 93.9 kg   SpO2 97%   BMI 27.32 kg/m   Intake/Output Summary (Last 24 hours) at 03/14/2024 1248 Last data filed at 03/14/2024 0554 Gross per 24 hour  Intake --  Output 1000 ml  Net -1000 ml   Weight change:   Physical Exam: Gen: Lying in bed in no distress CVS: Normal rate, no rub Resp: Bilateral chest rise with no increased work of breathing Abd: +choley drain, moderately distended Ext: no edema Neuro: awake, alert HL:qnozb prsent R internal jugular Temp HD cath bandaged  Imaging: No results found.    Labs: BMET Recent Labs  Lab 03/08/24 1100 03/09/24 0452 03/10/24 0718 03/12/24 0600 03/13/24 0947 03/14/24 0046  NA 138 137 135 138 137 139  K 4.0 4.0 4.4 4.0 3.8 3.9   CL 102 102 99 101 101 102  CO2 24 23 25 29 30 30   GLUCOSE 124* 92 102* 98 116* 97  BUN 61* 66* 41* 30* 34* 35*  CREATININE 6.17* 6.45* 4.70* 3.24* 3.20* 2.95*  CALCIUM  7.6* 7.5* 7.4* 7.6* 7.7* 7.8*  PHOS 5.6*  --   --   --   --   --    CBC Recent Labs  Lab 03/11/24 1748 03/12/24 0600 03/13/24 0947 03/13/24 1712  WBC 13.0* 10.0 8.3 10.1  HGB 8.3* 8.1* 7.9* 8.7*  HCT 25.1* 24.8* 23.9* 26.9*  MCV 91.6 91.9 92.3 94.4  PLT 111* 109* 111* 126*    Medications:     Chlorhexidine  Gluconate Cloth  6 each Topical Q0600   [START ON 03/15/2024] folic acid   1 mg Oral Daily   heparin   5,000 Units Subcutaneous Q8H   lidocaine   1 patch Transdermal Q24H   sodium chloride  flush  10-40 mL Intracatheter Q12H   sodium chloride  flush  5 mL Intracatheter Q8H      "

## 2024-03-14 NOTE — Progress Notes (Signed)
 [facility]  [name] FMW:[id number]  DOB: 19-Dec-1945  DOA: 03/01/2024  PCP: Kennyth Worth HERO, MD  03/14/2024,5:21 PM  LOS: 13 days    Code Status: Full code     from: Home   79 year old male HTN HLD RBBB reflux PVCs Primary hyperparathyroidism followed by endocrinologist Dr. Sam since 2022--- apparently underwent parathyroidectomy 2023 with Dr. Eletha 12/19 presented to [name], ED, NB diarrhea after frozen chicken meal profuse vomiting episodes loose stool-abdominal discomfort generalized-oliguric Admitted with hemodynamic AKI-(creatinine 6 on arrival) RUQ tenderness noted on deep palpation CT evidence of cholecystitis Admitted IV fluid repleted antibiotics started 12/20 general surgery consulted nephrology consulted and IR consulted because of severity of AKI/ATN  12/21-US  fluoroscopy cholecystectomy performed 12/23 right internal jugular trialysis 16 cm HD cath placed 12/26 hypotensive anemic-transfuse 2 units 12/27 transfuse 1 unit 12/28 hepatic thoracic angiogram performed-cholecystotomy tube exchange upsized to 12 French----transfuse 2 units 12/29 because of significant PVCs Zio patch mailed--- transfused 1 unit   Pertinent imaging/studies till date  Echocardiogram 12/22 EF 55-60% concentric LVH- 12/22-elevated right hemidiaphragm with right basilar opacity   Assessment  & Plan :    Hemoperitoneum with resulting acute blood loss anemia thrombocytopenia and hypotension occurring on 12/26 Low BP felt to be 2/2 biliary drain placement and biliary output and hemoperitoneum Transfused 5 units so far--check labs Acute blood loss anemia iron is 53 saturation is 21-borderline needs IV iron but would hold in the setting of drain/infection See below  Oliguric ATN with high gap acidosis--temp HD cath as above Minimize hypotension holding losartan  50 Toprol -XL 25 Cialis  -2.88 L since admission weight is up from 86-93--Oliguria seems to be resolving, passing good urine  overall Weight patient standing as able  Hypoxic respiratory failure previously Wean oxygen as able-manage volume also as able Hypoxia resolving--no concern PNA PE now--hodl CT imaging  Urinary retention-likely secondary to BPH- Hemoperitoneum obfuscates findings---foley removed on 03/14/24  Sepsis secondary to acute cholecystitis on admission .  Drains as above growing E. coli-completed 5 days Zosyn  12/26-needs interval 6-week visit with general surgery for interval cholecystectomy Drain studies as per IR  PVC NSVT 12/21 Cardiology saw patient 12/21 echo within normal limits Previously was on amiodarone Toprol -XL and is on ZIO monitor right now last seen by cardiology 12/25  Left renal mass----needs outpatient renal mass protocol MR  Lactic acidosis-resolved Gastroenteritis-coincidental Transaminitis probably secondary to statin and sepsis from biliary source  Data Reviewed today: Bun creat imporved to 35/2.9   DVT prophylaxis: SCDs   Dispo/Global plan: Inpatient --might d/c if HD cat removed in 24-48 hour home Rpt desat in am    Subjective:   Well--feels better No cp fever chills    Objective + exam Vitals:   03/14/24 0429 03/14/24 0800 03/14/24 1200 03/14/24 1400  BP: 136/65  117/62 136/68  Pulse: (!) 49  (!) 54 (!) 54  Resp: 18     Temp: 98 F (36.7 C)  98.2 F (36.8 C) 98.1 F (36.7 C)  TempSrc:    Oral  SpO2: 94% 97% 96% 93%  Weight:      Height:       Filed Weights   03/11/24 1349 03/11/24 1740 03/13/24 1642  Weight: 94.2 kg 95 kg 93.9 kg     Examination:  Looks well overall S1-S2 no murmur -Perc drain in place 12 French right lateral area Trialysis catheter in place--abdomen soft no rebound no guarding No lower extremity edema   Scheduled Meds:  Chlorhexidine  Gluconate Cloth  6  each Topical Q0600   [START ON 03/15/2024] folic acid   1 mg Oral Daily   heparin   5,000 Units Subcutaneous Q8H   lidocaine   1 patch Transdermal Q24H   sodium  chloride flush  10-40 mL Intracatheter Q12H   sodium chloride  flush  5 mL Intracatheter Q8H   Continuous Infusions: acetaminophen  **OR** acetaminophen , albuterol , HYDROmorphone  (DILAUDID ) injection, iohexol , loperamide , ondansetron  (ZOFRAN ) IV, oxyCODONE , sodium chloride  flush  Time 57  Colen Grimes, MD  [facility]

## 2024-03-14 NOTE — Plan of Care (Signed)

## 2024-03-15 DIAGNOSIS — N179 Acute kidney failure, unspecified: Secondary | ICD-10-CM | POA: Diagnosis not present

## 2024-03-15 LAB — CBC WITH DIFFERENTIAL/PLATELET
Abs Immature Granulocytes: 0.07 K/uL (ref 0.00–0.07)
Basophils Absolute: 0 K/uL (ref 0.0–0.1)
Basophils Relative: 0 %
Eosinophils Absolute: 0.1 K/uL (ref 0.0–0.5)
Eosinophils Relative: 1 %
HCT: 30.9 % — ABNORMAL LOW (ref 39.0–52.0)
Hemoglobin: 9.9 g/dL — ABNORMAL LOW (ref 13.0–17.0)
Immature Granulocytes: 1 %
Lymphocytes Relative: 14 %
Lymphs Abs: 1.4 K/uL (ref 0.7–4.0)
MCH: 29.8 pg (ref 26.0–34.0)
MCHC: 32 g/dL (ref 30.0–36.0)
MCV: 93.1 fL (ref 80.0–100.0)
Monocytes Absolute: 0.6 K/uL (ref 0.1–1.0)
Monocytes Relative: 6 %
Neutro Abs: 8.4 K/uL — ABNORMAL HIGH (ref 1.7–7.7)
Neutrophils Relative %: 78 %
Platelets: 148 K/uL — ABNORMAL LOW (ref 150–400)
RBC: 3.32 MIL/uL — ABNORMAL LOW (ref 4.22–5.81)
RDW: 15.7 % — ABNORMAL HIGH (ref 11.5–15.5)
WBC: 10.6 K/uL — ABNORMAL HIGH (ref 4.0–10.5)
nRBC: 0 % (ref 0.0–0.2)

## 2024-03-15 LAB — BASIC METABOLIC PANEL WITH GFR
Anion gap: 9 (ref 5–15)
BUN: 33 mg/dL — ABNORMAL HIGH (ref 8–23)
CO2: 29 mmol/L (ref 22–32)
Calcium: 8.2 mg/dL — ABNORMAL LOW (ref 8.9–10.3)
Chloride: 102 mmol/L (ref 98–111)
Creatinine, Ser: 2.55 mg/dL — ABNORMAL HIGH (ref 0.61–1.24)
GFR, Estimated: 25 mL/min — ABNORMAL LOW
Glucose, Bld: 101 mg/dL — ABNORMAL HIGH (ref 70–99)
Potassium: 4 mmol/L (ref 3.5–5.1)
Sodium: 140 mmol/L (ref 135–145)

## 2024-03-15 NOTE — Plan of Care (Signed)

## 2024-03-15 NOTE — Progress Notes (Signed)
 " [facility] Progress Note    Assessment/ Plan:   AKI on CKD3a, oliguric -baseline Cr around 1.1-1.3 with AKI likely related to ATN, peak Cr 8 now 2.55. -Recovering GFR, no HD required - IV team consulted to d/c HD Cath today - He has elevated PVRs but this is due to the hemoperitoneum; pelvic US  had bladder volume < earlier this week. Recommend foley removal prior to discharge.  -  Will need f/u at [facility] upon discharge - Dr. Marlee alerted [facility] office 03/14/24 who will reach out to pt next week w date/time of appt; made pt aware today   Anemia: Stable/improving with Hb 9.9 this AM, CTM.   Acute cholecystitis -per primary service. Surgery following -s/p choley drain 12/21 with IR, bloody drainage; tube exchanged 12/28   Left renal mass -recommend MRI with and without contrast as outpatient. If here longer than expected then can be done prior to discharge, will need to see urology if mass is suspicious for RCC.  Will try to have further GFR recovery prior to the gadolinium exposure  OK w discharge today per primary.  Nothing further to add.    Subjective:   Feels good, no complaints UOP 1.8L yesterday Foley now out SCr down to 2.55 IV team c/s to remove HD cath     Objective:   BP 129/61 (BP Location: Left Arm)   Pulse (!) 48   Temp 98.3 F (36.8 C) (Oral)   Resp 19   Ht 6' 1 (1.854 m)   Wt 93.5 kg   SpO2 93%   BMI 27.20 kg/m   Intake/Output Summary (Last 24 hours) at 03/15/2024 0820 Last data filed at 03/15/2024 0515 Gross per 24 hour  Intake 1110 ml  Output 2151 ml  Net -1041 ml   Weight change: -0.44 kg  Physical Exam: Gen: Lying in bed in no distress CVS: Normal rate, no rub Resp: Bilateral chest rise with no increased work of breathing Abd: +choley drain, mod distended Ext: no edema Neuro: awake, alert HL:qnozb removed R internal jugular Temp HD cath bandaged  Imaging: No results found.    Labs: BMET Recent Labs  Lab  03/08/24 1100 03/09/24 0452 03/10/24 0718 03/12/24 0600 03/13/24 0947 03/14/24 0046 03/15/24 0254  NA 138 137 135 138 137 139 140  K 4.0 4.0 4.4 4.0 3.8 3.9 4.0  CL 102 102 99 101 101 102 102  CO2 24 23 25 29 30 30 29   GLUCOSE 124* 92 102* 98 116* 97 101*  BUN 61* 66* 41* 30* 34* 35* 33*  CREATININE 6.17* 6.45* 4.70* 3.24* 3.20* 2.95* 2.55*  CALCIUM  7.6* 7.5* 7.4* 7.6* 7.7* 7.8* 8.2*  PHOS 5.6*  --   --   --   --   --   --    CBC Recent Labs  Lab 03/12/24 0600 03/13/24 0947 03/13/24 1712 03/15/24 0254  WBC 10.0 8.3 10.1 10.6*  NEUTROABS  --   --   --  8.4*  HGB 8.1* 7.9* 8.7* 9.9*  HCT 24.8* 23.9* 26.9* 30.9*  MCV 91.9 92.3 94.4 93.1  PLT 109* 111* 126* 148*    Medications:     Chlorhexidine  Gluconate Cloth  6 each Topical Q0600   folic acid   1 mg Oral Daily   heparin   5,000 Units Subcutaneous Q8H   lidocaine   1 patch Transdermal Q24H   sodium chloride  flush  10-40 mL Intracatheter Q12H   sodium chloride  flush  5 mL Intracatheter Q8H      "

## 2024-03-15 NOTE — Progress Notes (Signed)
 [facility]  Traci D Fry FMW:[id number]  DOB: 06/28/1945  DOA: 03/01/2024  PCP: Kennyth Worth HERO, MD  03/15/2024,2:20 PM  LOS: 14 days    Code Status: Full code     from: Home   79 year old male HTN HLD RBBB reflux PVCs Primary hyperparathyroidism followed by endocrinologist Dr. Sam since 2022--- apparently underwent parathyroidectomy 2023 with Dr. Eletha 12/19 presented to [name], ED, NB diarrhea after frozen chicken meal profuse vomiting episodes loose stool-abdominal discomfort generalized-oliguric Admitted with hemodynamic AKI-(creatinine 6 on arrival) RUQ tenderness noted on deep palpation CT evidence of cholecystitis Admitted IV fluid repleted antibiotics started 12/20 general surgery consulted nephrology consulted and IR consulted because of severity of AKI/ATN  12/21-US  fluoroscopy cholecystectomy performed 12/23 right internal jugular trialysis 16 cm HD cath placed 12/26 hypotensive anemic-transfuse 2 units 12/27 transfuse 1 unit 12/28 hepatic thoracic angiogram performed-cholecystotomy tube exchange upsized to 12 French----transfuse 2 units 12/29 because of significant PVCs Zio patch mailed--- transfused 1 unit   Pertinent imaging/studies till date  Echocardiogram 12/22 EF 55-60% concentric LVH- 12/22-elevated right hemidiaphragm with right basilar opacity   Assessment  & Plan :    Diarrheal illness Started this morning-3 loose BMs given Imodium  but only eating fruit and taking supplements does not like the food He has no fever but a slight white count so I would hold that Imodium  for now He has been encouraged to eat more solids and I have made sure that he is off of any laxatives  Hemoperitoneum with resulting acute blood loss anemia thrombocytopenia and hypotension occurring on 12/26 Low BP felt to be 2/2 biliary drain placement and biliary output and hemoperitoneum Transfused 5 units so far--stabilized Acute blood loss anemia iron is 53 saturation is 21-borderline  needs IV iron--- TBD as outpatient and set up with PCP for infusions Will discharge home with twice daily ferrous sulfate until can get repeat iron studies in 3 to 4 weeks  Oliguric ATN with high gap acidosis--temp HD cath as above Minimize hypotension holding losartan  50 Toprol -XL 25 Cialis  -2.88 L since admission weight is up from 86-93--Oliguria seems to be resolving, passing good urine overall TDC discontinued on 1/2-patient feels okay  Hypoxic respiratory failure previously Wean oxygen as able-manage volume also as able Hypoxia resolving--no concern PNA PE now--hold any imaging for now  Urinary retention-likely secondary to BPH- Hemoperitoneum obfuscates findings---foley removed on 03/14/24  Sepsis secondary to acute cholecystitis on admission .  Drains as above growing E. coli-completed 5 days Zosyn  12/26-needs interval 6-week visit with general surgery for interval cholecystectomy Drain studies as per IR  PVC NSVT 12/21 Cardiology saw patient 12/21 echo within normal limits Previously was on amiodarone Toprol -XL and is on ZIO monitor right now last seen by cardiology 12/25  Left renal mass----needs outpatient renal mass protocol MR  Lactic acidosis-resolved Gastroenteritis-coincidental Transaminitis probably secondary to statin and sepsis from biliary source  Data Reviewed today: Bun creat imporved to 35/2.9   DVT prophylaxis: SCDs   Dispo/Global plan: Likely discharge in a.m. Home    Subjective:   Overall well but 3 loose stools not really eating or drinking solids he is eating mainly liquids as well as food he is not taking any laxatives he does not have any chest pain no fever at this time   Objective + exam Vitals:   03/14/24 1400 03/14/24 1800 03/14/24 2000 03/15/24 0515  BP: 136/68  116/68 129/61  Pulse: (!) 54  (!) 51 (!) 48  Resp:   19 19  Temp: 98.1 F (36.7 C)  97.7 F (36.5 C) 98.3 F (36.8 C)  TempSrc: Oral  Oral Oral  SpO2: 93% 92% 91% 93%   Weight:      Height:       Filed Weights   03/11/24 1740 03/13/24 1642 03/14/24 0713  Weight: 95 kg 93.9 kg 93.5 kg     Examination:  Looks well overall S1-S2 no murmur -Perc drain in place 12 French right lateral area Trialysis catheter dressing in place on right neck--abdomen soft no rebound no guarding No lower extremity edema Power 5/5   Scheduled Meds:  Chlorhexidine  Gluconate Cloth  6 each Topical Q0600   folic acid   1 mg Oral Daily   heparin   5,000 Units Subcutaneous Q8H   lidocaine   1 patch Transdermal Q24H   sodium chloride  flush  10-40 mL Intracatheter Q12H   sodium chloride  flush  5 mL Intracatheter Q8H   Continuous Infusions: acetaminophen  **OR** acetaminophen , albuterol , HYDROmorphone  (DILAUDID ) injection, iohexol , loperamide , ondansetron  (ZOFRAN ) IV, oxyCODONE , sodium chloride  flush  Time 57  Colen Grimes, MD  [facility]

## 2024-03-16 ENCOUNTER — Other Ambulatory Visit (HOSPITAL_COMMUNITY): Payer: Self-pay

## 2024-03-16 DIAGNOSIS — N179 Acute kidney failure, unspecified: Secondary | ICD-10-CM | POA: Diagnosis not present

## 2024-03-16 MED ORDER — ACETAMINOPHEN 325 MG PO TABS: 650.0000 mg | ORAL_TABLET | Freq: Four times a day (QID) | ORAL | Status: AC | PRN

## 2024-03-16 MED ORDER — NORMAL SALINE FLUSH 0.9 % IV SOLN
5.0000 mL | Freq: Every day | INTRAVENOUS | 0 refills | Status: DC
  Filled 2024-03-16: qty 600, 120d supply, fill #0

## 2024-03-16 MED ORDER — LIDOCAINE 5 % EX PTCH
1.0000 | MEDICATED_PATCH | CUTANEOUS | 0 refills | Status: AC
  Filled 2024-03-16: qty 30, 30d supply, fill #0

## 2024-03-16 NOTE — Progress Notes (Signed)
 SATURATION QUALIFICATIONS: (This note is used to comply with regulatory documentation for home oxygen)  Patient Saturations on Room Air at Rest = 92%  Patient Saturations on Room Air while Ambulating = 95%    No oxygen needed for functional mobility.   Macario RAMAN, PT Acute Rehabilitation Services  Office [phone]

## 2024-03-16 NOTE — Plan of Care (Signed)

## 2024-03-16 NOTE — Progress Notes (Signed)
 Physical Therapy Treatment and Discharge Patient Details Name: [name] MRN: [id number] DOB: 1945-05-20 Today's Date: 03/16/2024   History of Present Illness 79 y.o. male presents to [facility] hospital on 03/01/2024 with nausea, vomiting and diarrhea. Pt admitted with AKI, hypokalemia and acute cholecystitis. Had to replace chole drain during stay and still has 1 chole drain in place.  PMH includes HTN, HLD, OA, RBBB.    PT Comments  Patient reports ambulating independently in room (to/from bathroom). Denied having had oxygen saturation checked when up ambulating and agreed to PT assessment. Sats increased to 95% on RA with gait. No imbalance noted. Patient deferred walking down to practice steps as he wants to conserve energy for going home and did not think stairs would be a barrier. No further PT needs identified. PT is signing off.     If plan is discharge home, recommend the following: Assist for transportation   Can travel by private vehicle        Equipment Recommendations  None recommended by PT    Recommendations for Other Services       Precautions / Restrictions Precautions Precautions: None Precaution/Restrictions Comments: chole drain Restrictions Weight Bearing Restrictions Per Provider Order: No     Mobility  Bed Mobility Overal bed mobility: Independent                  Transfers Overall transfer level: Independent                      Ambulation/Gait Ambulation/Gait assistance: Independent Gait Distance (Feet): 100 Feet Assistive device: None Gait Pattern/deviations: WFL(Within Functional Limits) Gait velocity: functional         Stairs             Wheelchair Mobility     Tilt Bed    Modified Rankin (Stroke Patients Only)       Balance Overall balance assessment: Independent                                          Communication Communication Communication: No apparent difficulties   Cognition Arousal: Alert Behavior During Therapy: WFL for tasks assessed/performed   PT - Cognitive impairments: No apparent impairments                         Following commands: Intact      Cueing Cueing Techniques: Verbal cues  Exercises      General Comments        Pertinent Vitals/Pain Pain Assessment Pain Assessment: No/denies pain    Home Living                          Prior Function            PT Goals (current goals can now be found in the care plan section) Acute Rehab PT Goals Patient Stated Goal: to go home Time For Goal Achievement: 03/27/24 Potential to Achieve Goals: Good Progress towards PT goals: Goals met/education completed, patient discharged from PT    Frequency    Min 2X/week      PT Plan      Co-evaluation              AM-PAC PT 6 Clicks Mobility   Outcome Measure  Help needed turning from your back to your  side while in a flat bed without using bedrails?: None Help needed moving from lying on your back to sitting on the side of a flat bed without using bedrails?: None Help needed moving to and from a bed to a chair (including a wheelchair)?: None Help needed standing up from a chair using your arms (e.g., wheelchair or bedside chair)?: None Help needed to walk in hospital room?: None Help needed climbing 3-5 steps with a railing? : None 6 Click Score: 24    End of Session   Activity Tolerance: Patient tolerated treatment well Patient left: with call bell/phone within reach;in bed Nurse Communication: Mobility status;Other (comment) (O2 sats 95% ambulating on RA) PT Visit Diagnosis: Other abnormalities of gait and mobility (R26.89)   PT Discharge Note  Patient is being discharged from PT services secondary to:  Goals met and no further therapy needs identified.  Please see latest Therapy Progress Note for current level of functioning and progress toward goals.  Progress and discharge plan  and discussed with patient/caregiver and they  Agree   Time: 0820-0830 PT Time Calculation (min) (ACUTE ONLY): 10 min  Charges:    $Gait Training: 8-22 mins PT General Charges $$ ACUTE PT VISIT: 1 Visit                      Macario RAMAN, PT Acute Rehabilitation Services  Office [phone]    Macario SHAUNNA Soja 03/16/2024, 8:35 AM

## 2024-03-16 NOTE — Discharge Summary (Signed)
 Physician Discharge Summary  [name] FMW:[id number] DOB: August 26, 1945 DOA: 03/01/2024  PCP: Steven Worth HERO, MD  Admit date: 03/01/2024 Discharge date: 03/16/2024  Time spent: 46 minutes  Recommendations for Outpatient Follow-up:  Get Chem-7 CBC in the outpatient setting in 3 to 5 days Outpatient follow-up IR/general surgery-CC Dr. Philip,  Dr. Dann Hummer with regards to outpatient planning and is discharging with drain Zio patch needs to be followed by care team-CC cardiology to ensure that this is not lost to follow-up  Discharge Diagnoses:  MAIN problem for hospitalization   Multiple-AKI Acute cholecystitis with PERC drain Needed intermittent dialysis this admission  Please see below for itemized issues addressed in HOpsital- refer to other progress notes for clarity if needed  Discharge Condition: Improved  Diet recommendation: Heart healthy  Filed Weights   03/14/24 0713 03/15/24 0702 03/16/24 0521  Weight: 93.5 kg 93 kg 89.2 kg    History of present illness:  79 year old male HTN HLD RBBB reflux PVCs Primary hyperparathyroidism followed by endocrinologist Dr. Sam since 2022--- apparently underwent parathyroidectomy 2023 with Dr. Eletha 12/19 presented to [name], ED, NB diarrhea after frozen chicken meal profuse vomiting episodes loose stool-abdominal discomfort generalized-oliguric Admitted with hemodynamic AKI-(creatinine 6 on arrival) RUQ tenderness noted on deep palpation CT evidence of cholecystitis Admitted IV fluid repleted antibiotics started 12/20 general surgery consulted nephrology consulted and IR consulted because of severity of AKI/ATN  12/21-US  fluoroscopy cholecystectomy performed 12/23 right internal jugular trialysis 16 cm HD cath placed 12/26 hypotensive anemic-transfuse 2 units 12/27 transfuse 1 unit 12/28 hepatic thoracic angiogram performed-cholecystotomy tube exchange upsized to 12 French----transfuse 2 units 12/29 because  of significant PVCs Zio patch mailed--- transfused 1 unit    Pertinent imaging/studies till date  Echocardiogram 12/22 EF 55-60% concentric LVH- 12/22-elevated right hemidiaphragm with right basilar opacity    Assessment  & Plan :      Diarrheal illness Started this morning-3 loose BMs given Imodium  and symptoms resolved Told 2 eat more solids as well as drinking primarily shakes  Hemoperitoneum with resulting acute blood loss anemia thrombocytopenia and hypotension occurring on 12/26 Low BP felt to be 2/2 biliary drain placement and biliary output and hemoperitoneum Transfused 5 units so far--stabilized Acute blood loss anemia iron is 53 saturation is 21-borderline needs IV iron--- TBD as outpatient and set up with PCP for infusions Hemoglobin improved to a degree so we will reevaluate as an outpatient with labs   Oliguric ATN with high gap acidosis--temp HD cath as above Minimize hypotension holding losartan  50 Toprol -XL 25 Cialis  as an outpatient -5.7 L by the end of hospitalization nonoliguric-- weight is up from 86-93--Oliguria seems to be resolving, passing good urine overall TDC discontinued on 1/2-patient feels okay but has some mild pain which is going to resolve-symptomatic management   Hypoxic respiratory failure previously Weaned off of oxygen as able--- weight has dropped and this resolved fluid overload Hypoxia resolving--no concern PNA PE now--hold any imaging for now   Urinary retention-likely secondary to BPH- Hemoperitoneum obfuscates findings---foley removed on 03/14/24   Sepsis secondary to acute cholecystitis on admission Drains as above growing E. coli-completed 5 days Zosyn  12/26-needs interval 6-week visit with general surgery for interval cholecystectomy Drain studies as per IR CCed to Dr. Philip, Dr. Hummer   PVC NSVT 12/21 Cardiology saw patient 12/21 echo within normal limits Previously was on amiodarone Toprol -XL and is on ZIO monitor right now last  seen by cardiology 12/25   Left renal mass----needs outpatient renal mass  protocol MR   Lactic acidosis-resolved Gastroenteritis-coincidental Transaminitis probably secondary to statin and sepsis from biliary source  Discharge Exam: Vitals:   03/15/24 2157 03/16/24 0521  BP: (!) 130/55 119/76  Pulse: (!) 55 (!) 57  Resp: 18 18  Temp: 97.8 F (36.6 C) 98 F (36.7 C)  SpO2: 91% 93%    Subj on day of d/c   Well no distress no fever no chills  General Exam on discharge  EOMI NCAT no focal deficit icterus Chest is clear no rales rhonchi wheezes Power 5/5 ROM intact  Discharge Instructions   Discharge Instructions     Discharge instructions   Complete by: As directed    Will be discharging home with the drain in place and you will need to learn how to manage it nursing staff will teach you how to do some of this Would expect that you will need follow-up with interventional radiology here at [facility] in the next several weeks once they determine what to do about the drain and if you can have an interval cholecystectomy-I will CC Dr. Elihu of radiology to make sure that he is aware in addition to the surgeon who saw you Dr. Sebastian to make sure that you are not lost to follow-up I would recommend outpatient labs in a week in about 1 week-Go slowly with mobility and get labs checked in about a week to make sure that your kidney function still continues to improve You do not have any diet restrictions Noticed that we will discontinue several medications for now including your losartan  and metoprolol  and ask your doctor about resuming these in the outpatient setting Best of luck   Increase activity slowly   Complete by: As directed    Leave dressing on - Keep it clean, dry, and intact until clinic visit   Complete by: As directed    Follow wound and drain instructions as per interventional radiology and follow-up with them and general surgery regarding this management       Allergies as of 03/16/2024       Reactions   Advil [ibuprofen] Other (See Comments)   Hx of bleeding ulcer   Choline Fenofibrate Other (See Comments)   ABDOMINAL PAIN, CONSTIPATION        Medication List     STOP taking these medications    losartan  50 MG tablet Commonly known as: COZAAR    metoprolol  succinate 25 MG 24 hr tablet Commonly known as: TOPROL -XL   tadalafil  20 MG tablet Commonly known as: CIALIS        TAKE these medications    acetaminophen  325 MG tablet Commonly known as: TYLENOL  Take 2 tablets (650 mg total) by mouth every 6 (six) hours as needed for mild pain (pain score 1-3) or fever (or Fever >/= 101).   atorvastatin  40 MG tablet Commonly known as: LIPITOR TAKE 1 TABLET BY MOUTH EVERY DAY   lidocaine  5 % Commonly known as: LIDODERM  Place 1 patch onto the skin daily. Remove & Discard patch within 12 hours or as directed by MD Start taking on: March 17, 2024               Discharge Care Instructions  (From admission, onward)           Start     Ordered   03/16/24 0000  Leave dressing on - Keep it clean, dry, and intact until clinic visit       Comments: Follow wound and drain instructions as per  interventional radiology and follow-up with them and general surgery regarding this management   03/16/24 0941           Allergies[1]  Follow-up Information     Sebastian Moles, MD Follow up on 05/03/2024.   Specialty: General Surgery Why: 3:30pm. Please bring a copy of your photo ID, insurance card and arrive 30 mimutes prior to your appointment for paperwork.  Please ensure that you have follow up with IR for a Cholangiogram before your appointment with Dr. Sebastian Pass information: [address] [place] [address] [phone]         [facility] Follow up.   Why: Please follow up with Interventional Radiology in approximately 6 weeks for a drain evaluation. A scheduler from  our office will call you with a date/time of your appointment. Please call our office with any questions/concerns prior to your visit. Contact information: [address] [place] [address] [phone]         [facility] Follow up.   Why: For [facility] RN Contact information: [address] [place] [address] [phone]         [facility] Follow up in 2 week(s).   Why: We will call with information about labs and appointments Contact information: [address] [place] [address] [phone]                  The results of significant diagnostics from this hospitalization (including imaging, microbiology, ancillary and laboratory) are listed below for reference.    Significant Diagnostic Studies: US  PELVIS LIMITED (TRANSABDOMINAL ONLY) Result Date: 03/12/2024 CLINICAL DATA:  Acute urinary retention. Intraperitoneal hemorrhage following percutaneous cholecystostomy tube placement on 03/03/2024. EXAM: LIMITED ULTRASOUND OF PELVIS TECHNIQUE: Limited transabdominal ultrasound examination of the pelvis was performed. COMPARISON:  Abdomen and pelvis CTA dated 03/08/2025. FINDINGS: The urinary bladder has a normal appearance for the degree of distention. Pre-void volume: 98.4 mL Post-void volume: Not obtained.  Patient unable to urinate. Other findings: Moderate amount of free peritoneal fluid containing diffuse internal echoes, compatible with the recently demonstrated hemoperitoneum. IMPRESSION: 1. Normal appearance of the urinary bladder with a calculated volume of 98.4 cc. 2. Moderate sized hemoperitoneum. Electronically Signed   By: Elspeth Bathe M.D.   On: 03/12/2024 14:19   IR Angiogram Visceral Selective Result Date: 03/10/2024 INDICATION: 79 year old male with history of intraperitoneal hemorrhage status post percutaneous cholecystostomy tube placement with persistent anemia despite transfusion. EXAM: SELECTIVE VISCERAL  ARTERIOGRAPHY; THORACIC AORTOGRAPHY; ADDITIONAL ARTERIOGRAPHY; IR ULTRASOUND GUIDANCE VASC ACCESS RIGHT; IR EXCHANGE BILARY DRAIN MEDICATIONS: None. ANESTHESIA/SEDATION: Moderate (conscious) sedation was employed during this procedure. A total of Versed  2 mg and Fentanyl  100 mcg was administered intravenously. Moderate Sedation Time: 55 minutes. The patient's level of consciousness and vital signs were monitored continuously by radiology nursing throughout the procedure under my direct supervision. CONTRAST:  80mL OMNIPAQUE  IOHEXOL  300 MG/ML SOLN, 20mL OMNIPAQUE  IOHEXOL  300 MG/ML SOLN, <See Chart> OMNIPAQUE  IOHEXOL  300 MG/ML SOLN, 70mL OMNIPAQUE  IOHEXOL  300 MG/ML SOLN FLUOROSCOPY: Radiation Exposure Index (as provided by the fluoroscopic device): 1,170 mGy reference air Kerma COMPLICATIONS: None immediate. PROCEDURE: Informed consent was obtained from the patient following explanation of the procedure, risks, benefits and alternatives. The patient understands, agrees and consents for the procedure. All questions were addressed. A time out was performed prior to the initiation of the procedure. Maximal barrier sterile technique utilized including caps, mask, sterile gowns, sterile gloves, large  sterile drape, hand hygiene, and Betadine  prep. Preprocedure ultrasound evaluation demonstrated patency of the right common femoral artery. The procedure was planned. Subdermal Local anesthesia was administered at the planned needle entry site with 1% lidocaine . A small skin nick was made. Under direct ultrasound visualization, a 20 gauge micropuncture needle was directed into the right common femoral artery. A permanent ultrasound image was captured and stored in the record. A J wire was then directed to the proximal abdominal aorta over which the micropuncture sheath was exchanged for a 5 French vascular sheath. A C2 catheter was then introduced and used to select the celiac trunk. Dedicated celiac angiogram demonstrated  patency of the proximal branches including the splenic, left gastric, and common hepatic arteries. The common hepatic artery was then selected. Dedicated angiogram from this location demonstrated patency of the common, gastroduodenal, proper, left, and right hepatic arteries without evidence of active extravasation. Specifically, there is no evidence of extravasation from the cystic artery or about the hepatic capsule. Attention was then turned toward the indwelling percutaneous cholecystostomy tube. The surrounding skin was anesthetized locally with 1% lidocaine . The retention suture was cut. The external portion of the drain was cut to release inter pigtail. An Amplatz wire was inserted and coiled within the gallbladder lumen. The drain was removed over the wire. Upon removal, there was immediate and voluminous dark, venous appearing hemorrhage from the skin entry site. Repeat angiogram from the proper hepatic artery demonstrated similar findings to previous with no evidence of arterial extravasation about the established percutaneous cholecystostomy tract. A 12 French pigtail drainage catheter was then prepared and quickly exchanged over the wire with the pigtail portion then formed in the gallbladder lumen. Contrast was injected via the drain which demonstrated multiple mobile filling defects compatible with cholelithiasis. There is faint opacification of the peripheral cystic duct. The common bile duct was not visualized. A 2.4 French Progreat microcatheter and fathom 14 microwire were then used to select the cystic artery. Dedicated angiogram from the cystic artery demonstrated no evidence of active extravasation into the gallbladder lumen. Attention was then turned toward investigating the right T9 intercostal artery. The indwelling catheter was exchanged for a flush catheter was was directed to the midthoracic aorta and thoracic angiogram was obtained. Thoracic angiogram was significant for patency and  tortuosity of the visualized thoracoabdominal aorta without evidence of stenosis. The T9-T10 intercostal arteries were visualized without evidence of active extravasation about the catheter transgression site. A Mickelson catheter was directed to the distal thoracic aorta and attempted to be used to cannulate the right T9 intercostal artery which was unsuccessful. Next, a C2 catheter was attempted at this location which was partially successful at cannulating the ostium, however established access was unable to be secured despite multiple attempts. The procedure was then aborted. The catheters were removed. The right common femoral artery access was closed with a 5 French Mynx device was deployed successfully without complication. Peripheral pulses were unchanged. The patient tolerated the procedure well was transferred back to floor in good condition. IMPRESSION: 1. Technically successful percutaneous cholecystostomy tube exchange and upsize to 12 French. Cholecystogram is significant for cholelithiasis and persistent cystic duct obstruction. 2. Upon removal of the indwelling 10 French percutaneous cholecystostomy tube, there was brisk venous appearing bleeding from the skin entry site. This terminated after up sizing. 3. Hepatic and thoracic angiography demonstrate no evidence of active extravasation about the gallbladder, hepatic capsule, or catheter entry site about the right mid axillary thorax. Ester Sides, MD [facility] Radiology Electronically Signed   By: Ester Sides M.D.   On: 03/10/2024 19:48   IR Aorta/Thoracic Result Date: 03/10/2024 INDICATION: 79 year old male with history of intraperitoneal hemorrhage status post percutaneous cholecystostomy tube placement with persistent anemia despite transfusion. EXAM: SELECTIVE VISCERAL ARTERIOGRAPHY; THORACIC AORTOGRAPHY; ADDITIONAL ARTERIOGRAPHY; IR ULTRASOUND GUIDANCE VASC ACCESS RIGHT; IR EXCHANGE  BILARY DRAIN MEDICATIONS: None. ANESTHESIA/SEDATION: Moderate (conscious) sedation was employed during this procedure. A total of Versed  2 mg and Fentanyl  100 mcg was administered intravenously. Moderate Sedation Time: 55 minutes. The patient's level of consciousness and vital signs were monitored continuously by radiology nursing throughout the procedure under my direct supervision. CONTRAST:  80mL OMNIPAQUE  IOHEXOL  300 MG/ML SOLN, 20mL OMNIPAQUE  IOHEXOL  300 MG/ML SOLN, <See Chart> OMNIPAQUE  IOHEXOL  300 MG/ML SOLN, 70mL OMNIPAQUE  IOHEXOL  300 MG/ML SOLN FLUOROSCOPY: Radiation Exposure Index (as provided by the fluoroscopic device): 1,170 mGy reference air Kerma COMPLICATIONS: None immediate. PROCEDURE: Informed consent was obtained from the patient following explanation of the procedure, risks, benefits and alternatives. The patient understands, agrees and consents for the procedure. All questions were addressed. A time out was performed prior to the initiation of the procedure. Maximal barrier sterile technique utilized including caps, mask, sterile gowns, sterile gloves, large sterile drape, hand hygiene, and Betadine  prep. Preprocedure ultrasound evaluation demonstrated patency of the right common femoral artery. The procedure was planned. Subdermal Local anesthesia was administered at the planned needle entry site with 1% lidocaine . A small skin nick was made. Under direct ultrasound visualization, a 20 gauge micropuncture needle was directed into the right common femoral artery. A permanent ultrasound image was captured and stored in the record. A J wire was then directed to the proximal abdominal aorta over which the micropuncture sheath was exchanged for a 5 French vascular sheath. A C2 catheter was then introduced and used to select the celiac trunk. Dedicated celiac angiogram demonstrated patency of the proximal branches including the splenic, left gastric, and common hepatic arteries. The common hepatic  artery was then selected. Dedicated angiogram from this location demonstrated patency of the common, gastroduodenal, proper, left, and right hepatic arteries without evidence of active extravasation. Specifically, there is no evidence of extravasation from the cystic artery or about the hepatic capsule. Attention was then turned toward the indwelling percutaneous cholecystostomy tube. The surrounding skin was anesthetized locally with 1% lidocaine . The retention suture was cut. The external portion of the drain was cut to release inter pigtail. An Amplatz wire was inserted and coiled within the gallbladder lumen. The drain was removed over the wire. Upon removal, there was immediate and voluminous dark, venous appearing hemorrhage from the skin entry site. Repeat angiogram from the proper hepatic artery demonstrated similar findings to previous with no evidence of arterial extravasation about the established percutaneous cholecystostomy tract. A 12 French pigtail drainage catheter was then prepared and quickly exchanged over the wire with the pigtail portion then formed in the gallbladder lumen. Contrast was injected via the drain which demonstrated multiple mobile filling defects compatible with cholelithiasis. There is faint opacification of the peripheral cystic duct. The common bile duct was not visualized. A 2.4 French Progreat microcatheter and fathom 14 microwire were then used to select the cystic artery. Dedicated angiogram from the cystic artery demonstrated no evidence of active extravasation into the gallbladder lumen. Attention was then turned toward investigating the right T9 intercostal artery. The indwelling catheter was exchanged for a flush catheter was was directed to the midthoracic aorta and thoracic angiogram was obtained.  Thoracic angiogram was significant for patency and tortuosity of the visualized thoracoabdominal aorta without evidence of stenosis. The T9-T10 intercostal arteries were  visualized without evidence of active extravasation about the catheter transgression site. A Mickelson catheter was directed to the distal thoracic aorta and attempted to be used to cannulate the right T9 intercostal artery which was unsuccessful. Next, a C2 catheter was attempted at this location which was partially successful at cannulating the ostium, however established access was unable to be secured despite multiple attempts. The procedure was then aborted. The catheters were removed. The right common femoral artery access was closed with a 5 French Mynx device was deployed successfully without complication. Peripheral pulses were unchanged. The patient tolerated the procedure well was transferred back to floor in good condition. IMPRESSION: 1. Technically successful percutaneous cholecystostomy tube exchange and upsize to 12 French. Cholecystogram is significant for cholelithiasis and persistent cystic duct obstruction. 2. Upon removal of the indwelling 10 French percutaneous cholecystostomy tube, there was brisk venous appearing bleeding from the skin entry site. This terminated after up sizing. 3. Hepatic and thoracic angiography demonstrate no evidence of active extravasation about the gallbladder, hepatic capsule, or catheter entry site about the right mid axillary thorax. Ester Sides, MD [facility] Radiology Electronically Signed   By: Ester Sides M.D.   On: 03/10/2024 19:48   IR US  Guide Vasc Access Right Result Date: 03/10/2024 INDICATION: 79 year old male with history of intraperitoneal hemorrhage status post percutaneous cholecystostomy tube placement with persistent anemia despite transfusion. EXAM: SELECTIVE VISCERAL ARTERIOGRAPHY; THORACIC AORTOGRAPHY; ADDITIONAL ARTERIOGRAPHY; IR ULTRASOUND GUIDANCE VASC ACCESS RIGHT; IR EXCHANGE BILARY DRAIN MEDICATIONS: None. ANESTHESIA/SEDATION: Moderate (conscious) sedation was employed during this  procedure. A total of Versed  2 mg and Fentanyl  100 mcg was administered intravenously. Moderate Sedation Time: 55 minutes. The patient's level of consciousness and vital signs were monitored continuously by radiology nursing throughout the procedure under my direct supervision. CONTRAST:  80mL OMNIPAQUE  IOHEXOL  300 MG/ML SOLN, 20mL OMNIPAQUE  IOHEXOL  300 MG/ML SOLN, <See Chart> OMNIPAQUE  IOHEXOL  300 MG/ML SOLN, 70mL OMNIPAQUE  IOHEXOL  300 MG/ML SOLN FLUOROSCOPY: Radiation Exposure Index (as provided by the fluoroscopic device): 1,170 mGy reference air Kerma COMPLICATIONS: None immediate. PROCEDURE: Informed consent was obtained from the patient following explanation of the procedure, risks, benefits and alternatives. The patient understands, agrees and consents for the procedure. All questions were addressed. A time out was performed prior to the initiation of the procedure. Maximal barrier sterile technique utilized including caps, mask, sterile gowns, sterile gloves, large sterile drape, hand hygiene, and Betadine  prep. Preprocedure ultrasound evaluation demonstrated patency of the right common femoral artery. The procedure was planned. Subdermal Local anesthesia was administered at the planned needle entry site with 1% lidocaine . A small skin nick was made. Under direct ultrasound visualization, a 20 gauge micropuncture needle was directed into the right common femoral artery. A permanent ultrasound image was captured and stored in the record. A J wire was then directed to the proximal abdominal aorta over which the micropuncture sheath was exchanged for a 5 French vascular sheath. A C2 catheter was then introduced and used to select the celiac trunk. Dedicated celiac angiogram demonstrated patency of the proximal branches including the splenic, left gastric, and common hepatic arteries. The common hepatic artery was then selected. Dedicated angiogram from this location demonstrated patency of the common,  gastroduodenal, proper, left, and right hepatic arteries without evidence of active extravasation. Specifically, there is no evidence of extravasation from the cystic artery or about  the hepatic capsule. Attention was then turned toward the indwelling percutaneous cholecystostomy tube. The surrounding skin was anesthetized locally with 1% lidocaine . The retention suture was cut. The external portion of the drain was cut to release inter pigtail. An Amplatz wire was inserted and coiled within the gallbladder lumen. The drain was removed over the wire. Upon removal, there was immediate and voluminous dark, venous appearing hemorrhage from the skin entry site. Repeat angiogram from the proper hepatic artery demonstrated similar findings to previous with no evidence of arterial extravasation about the established percutaneous cholecystostomy tract. A 12 French pigtail drainage catheter was then prepared and quickly exchanged over the wire with the pigtail portion then formed in the gallbladder lumen. Contrast was injected via the drain which demonstrated multiple mobile filling defects compatible with cholelithiasis. There is faint opacification of the peripheral cystic duct. The common bile duct was not visualized. A 2.4 French Progreat microcatheter and fathom 14 microwire were then used to select the cystic artery. Dedicated angiogram from the cystic artery demonstrated no evidence of active extravasation into the gallbladder lumen. Attention was then turned toward investigating the right T9 intercostal artery. The indwelling catheter was exchanged for a flush catheter was was directed to the midthoracic aorta and thoracic angiogram was obtained. Thoracic angiogram was significant for patency and tortuosity of the visualized thoracoabdominal aorta without evidence of stenosis. The T9-T10 intercostal arteries were visualized without evidence of active extravasation about the catheter transgression site. A Mickelson  catheter was directed to the distal thoracic aorta and attempted to be used to cannulate the right T9 intercostal artery which was unsuccessful. Next, a C2 catheter was attempted at this location which was partially successful at cannulating the ostium, however established access was unable to be secured despite multiple attempts. The procedure was then aborted. The catheters were removed. The right common femoral artery access was closed with a 5 French Mynx device was deployed successfully without complication. Peripheral pulses were unchanged. The patient tolerated the procedure well was transferred back to floor in good condition. IMPRESSION: 1. Technically successful percutaneous cholecystostomy tube exchange and upsize to 12 French. Cholecystogram is significant for cholelithiasis and persistent cystic duct obstruction. 2. Upon removal of the indwelling 10 French percutaneous cholecystostomy tube, there was brisk venous appearing bleeding from the skin entry site. This terminated after up sizing. 3. Hepatic and thoracic angiography demonstrate no evidence of active extravasation about the gallbladder, hepatic capsule, or catheter entry site about the right mid axillary thorax. Ester Sides, MD [facility] Electronically Signed   By: Ester Sides M.D.   On: 03/10/2024 19:48   IR Angiogram Selective Each Additional Vessel Result Date: 03/10/2024 INDICATION: 79 year old male with history of intraperitoneal hemorrhage status post percutaneous cholecystostomy tube placement with persistent anemia despite transfusion. EXAM: SELECTIVE VISCERAL ARTERIOGRAPHY; THORACIC AORTOGRAPHY; ADDITIONAL ARTERIOGRAPHY; IR ULTRASOUND GUIDANCE VASC ACCESS RIGHT; IR EXCHANGE BILARY DRAIN MEDICATIONS: None. ANESTHESIA/SEDATION: Moderate (conscious) sedation was employed during this procedure. A total of Versed  2 mg and Fentanyl  100 mcg was administered intravenously.  Moderate Sedation Time: 55 minutes. The patient's level of consciousness and vital signs were monitored continuously by radiology nursing throughout the procedure under my direct supervision. CONTRAST:  80mL OMNIPAQUE  IOHEXOL  300 MG/ML SOLN, 20mL OMNIPAQUE  IOHEXOL  300 MG/ML SOLN, <See Chart> OMNIPAQUE  IOHEXOL  300 MG/ML SOLN, 70mL OMNIPAQUE  IOHEXOL  300 MG/ML SOLN FLUOROSCOPY: Radiation Exposure Index (as provided by the fluoroscopic device): 1,170 mGy reference air Kerma COMPLICATIONS: None immediate. PROCEDURE: Informed consent was obtained  from the patient following explanation of the procedure, risks, benefits and alternatives. The patient understands, agrees and consents for the procedure. All questions were addressed. A time out was performed prior to the initiation of the procedure. Maximal barrier sterile technique utilized including caps, mask, sterile gowns, sterile gloves, large sterile drape, hand hygiene, and Betadine  prep. Preprocedure ultrasound evaluation demonstrated patency of the right common femoral artery. The procedure was planned. Subdermal Local anesthesia was administered at the planned needle entry site with 1% lidocaine . A small skin nick was made. Under direct ultrasound visualization, a 20 gauge micropuncture needle was directed into the right common femoral artery. A permanent ultrasound image was captured and stored in the record. A J wire was then directed to the proximal abdominal aorta over which the micropuncture sheath was exchanged for a 5 French vascular sheath. A C2 catheter was then introduced and used to select the celiac trunk. Dedicated celiac angiogram demonstrated patency of the proximal branches including the splenic, left gastric, and common hepatic arteries. The common hepatic artery was then selected. Dedicated angiogram from this location demonstrated patency of the common, gastroduodenal, proper, left, and right hepatic arteries without evidence of active  extravasation. Specifically, there is no evidence of extravasation from the cystic artery or about the hepatic capsule. Attention was then turned toward the indwelling percutaneous cholecystostomy tube. The surrounding skin was anesthetized locally with 1% lidocaine . The retention suture was cut. The external portion of the drain was cut to release inter pigtail. An Amplatz wire was inserted and coiled within the gallbladder lumen. The drain was removed over the wire. Upon removal, there was immediate and voluminous dark, venous appearing hemorrhage from the skin entry site. Repeat angiogram from the proper hepatic artery demonstrated similar findings to previous with no evidence of arterial extravasation about the established percutaneous cholecystostomy tract. A 12 French pigtail drainage catheter was then prepared and quickly exchanged over the wire with the pigtail portion then formed in the gallbladder lumen. Contrast was injected via the drain which demonstrated multiple mobile filling defects compatible with cholelithiasis. There is faint opacification of the peripheral cystic duct. The common bile duct was not visualized. A 2.4 French Progreat microcatheter and fathom 14 microwire were then used to select the cystic artery. Dedicated angiogram from the cystic artery demonstrated no evidence of active extravasation into the gallbladder lumen. Attention was then turned toward investigating the right T9 intercostal artery. The indwelling catheter was exchanged for a flush catheter was was directed to the midthoracic aorta and thoracic angiogram was obtained. Thoracic angiogram was significant for patency and tortuosity of the visualized thoracoabdominal aorta without evidence of stenosis. The T9-T10 intercostal arteries were visualized without evidence of active extravasation about the catheter transgression site. A Mickelson catheter was directed to the distal thoracic aorta and attempted to be used to  cannulate the right T9 intercostal artery which was unsuccessful. Next, a C2 catheter was attempted at this location which was partially successful at cannulating the ostium, however established access was unable to be secured despite multiple attempts. The procedure was then aborted. The catheters were removed. The right common femoral artery access was closed with a 5 French Mynx device was deployed successfully without complication. Peripheral pulses were unchanged. The patient tolerated the procedure well was transferred back to floor in good condition. IMPRESSION: 1. Technically successful percutaneous cholecystostomy tube exchange and upsize to 12 French. Cholecystogram is significant for cholelithiasis and persistent cystic duct obstruction. 2. Upon removal of the indwelling 10 French  percutaneous cholecystostomy tube, there was brisk venous appearing bleeding from the skin entry site. This terminated after up sizing. 3. Hepatic and thoracic angiography demonstrate no evidence of active extravasation about the gallbladder, hepatic capsule, or catheter entry site about the right mid axillary thorax. Ester Sides, MD [facility] Radiology Electronically Signed   By: Ester Sides M.D.   On: 03/10/2024 19:48   IR EXCHANGE BILIARY DRAIN Result Date: 03/10/2024 INDICATION: 80 year old male with history of intraperitoneal hemorrhage status post percutaneous cholecystostomy tube placement with persistent anemia despite transfusion. EXAM: SELECTIVE VISCERAL ARTERIOGRAPHY; THORACIC AORTOGRAPHY; ADDITIONAL ARTERIOGRAPHY; IR ULTRASOUND GUIDANCE VASC ACCESS RIGHT; IR EXCHANGE BILARY DRAIN MEDICATIONS: None. ANESTHESIA/SEDATION: Moderate (conscious) sedation was employed during this procedure. A total of Versed  2 mg and Fentanyl  100 mcg was administered intravenously. Moderate Sedation Time: 55 minutes. The patient's level of consciousness and vital signs were monitored  continuously by radiology nursing throughout the procedure under my direct supervision. CONTRAST:  80mL OMNIPAQUE  IOHEXOL  300 MG/ML SOLN, 20mL OMNIPAQUE  IOHEXOL  300 MG/ML SOLN, <See Chart> OMNIPAQUE  IOHEXOL  300 MG/ML SOLN, 70mL OMNIPAQUE  IOHEXOL  300 MG/ML SOLN FLUOROSCOPY: Radiation Exposure Index (as provided by the fluoroscopic device): 1,170 mGy reference air Kerma COMPLICATIONS: None immediate. PROCEDURE: Informed consent was obtained from the patient following explanation of the procedure, risks, benefits and alternatives. The patient understands, agrees and consents for the procedure. All questions were addressed. A time out was performed prior to the initiation of the procedure. Maximal barrier sterile technique utilized including caps, mask, sterile gowns, sterile gloves, large sterile drape, hand hygiene, and Betadine  prep. Preprocedure ultrasound evaluation demonstrated patency of the right common femoral artery. The procedure was planned. Subdermal Local anesthesia was administered at the planned needle entry site with 1% lidocaine . A small skin nick was made. Under direct ultrasound visualization, a 20 gauge micropuncture needle was directed into the right common femoral artery. A permanent ultrasound image was captured and stored in the record. A J wire was then directed to the proximal abdominal aorta over which the micropuncture sheath was exchanged for a 5 French vascular sheath. A C2 catheter was then introduced and used to select the celiac trunk. Dedicated celiac angiogram demonstrated patency of the proximal branches including the splenic, left gastric, and common hepatic arteries. The common hepatic artery was then selected. Dedicated angiogram from this location demonstrated patency of the common, gastroduodenal, proper, left, and right hepatic arteries without evidence of active extravasation. Specifically, there is no evidence of extravasation from the cystic artery or about the hepatic  capsule. Attention was then turned toward the indwelling percutaneous cholecystostomy tube. The surrounding skin was anesthetized locally with 1% lidocaine . The retention suture was cut. The external portion of the drain was cut to release inter pigtail. An Amplatz wire was inserted and coiled within the gallbladder lumen. The drain was removed over the wire. Upon removal, there was immediate and voluminous dark, venous appearing hemorrhage from the skin entry site. Repeat angiogram from the proper hepatic artery demonstrated similar findings to previous with no evidence of arterial extravasation about the established percutaneous cholecystostomy tract. A 12 French pigtail drainage catheter was then prepared and quickly exchanged over the wire with the pigtail portion then formed in the gallbladder lumen. Contrast was injected via the drain which demonstrated multiple mobile filling defects compatible with cholelithiasis. There is faint opacification of the peripheral cystic duct. The common bile duct was not visualized. A 2.4 French Progreat microcatheter and fathom 14 microwire were then used to  select the cystic artery. Dedicated angiogram from the cystic artery demonstrated no evidence of active extravasation into the gallbladder lumen. Attention was then turned toward investigating the right T9 intercostal artery. The indwelling catheter was exchanged for a flush catheter was was directed to the midthoracic aorta and thoracic angiogram was obtained. Thoracic angiogram was significant for patency and tortuosity of the visualized thoracoabdominal aorta without evidence of stenosis. The T9-T10 intercostal arteries were visualized without evidence of active extravasation about the catheter transgression site. A Mickelson catheter was directed to the distal thoracic aorta and attempted to be used to cannulate the right T9 intercostal artery which was unsuccessful. Next, a C2 catheter was attempted at this location  which was partially successful at cannulating the ostium, however established access was unable to be secured despite multiple attempts. The procedure was then aborted. The catheters were removed. The right common femoral artery access was closed with a 5 French Mynx device was deployed successfully without complication. Peripheral pulses were unchanged. The patient tolerated the procedure well was transferred back to floor in good condition. IMPRESSION: 1. Technically successful percutaneous cholecystostomy tube exchange and upsize to 12 French. Cholecystogram is significant for cholelithiasis and persistent cystic duct obstruction. 2. Upon removal of the indwelling 10 French percutaneous cholecystostomy tube, there was brisk venous appearing bleeding from the skin entry site. This terminated after up sizing. 3. Hepatic and thoracic angiography demonstrate no evidence of active extravasation about the gallbladder, hepatic capsule, or catheter entry site about the right mid axillary thorax. Ester Sides, MD [facility] Radiology Electronically Signed   By: Ester Sides M.D.   On: 03/10/2024 19:48   CT Angio Abd/Pel w/ and/or w/o Result Date: 03/08/2024 EXAM: CTA ABDOMEN AND PELVIS WITH AND WITHOUT CONTRAST 03/08/2024 07:09:03 PM TECHNIQUE: CTA images of the abdomen and pelvis without and with intravenous contrast. Three-dimensional MIP/volume rendered formations were performed. Automated exposure control, iterative reconstruction, and/or weight based adjustment of the mA/kV was utilized to reduce the radiation dose to as low as reasonably achievable. COMPARISON: 03/01/2024 CLINICAL HISTORY: Retroperitoneal bleed suspected. FINDINGS: VASCULATURE: AORTA: Extensive aortoiliac atherosclerotic calcification. No acute finding. No abdominal aortic aneurysm. No dissection. CELIAC TRUNK: No acute finding. No occlusion or significant stenosis. SUPERIOR MESENTERIC ARTERY:  No acute finding. No occlusion or significant stenosis. RENAL ARTERIES: No acute finding. No occlusion or significant stenosis. ILIAC ARTERIES: Extensive aortoiliac atherosclerotic calcification. No acute finding. No occlusion or significant stenosis. LIVER: Interval transhepatic percutaneous cholecystostomy has been performed with decompression of the gallbladder. The retaining loop of the drainage catheter appears well positioned within the decompressed gallbladder lumen. There has developed with a sentinel clot seen within the right posterior perihepatic region extending into the subdiaphragmatic region and inferiorly into Morison's pouch. There is, however, no active extravasation arising from the transhepatic puncture at this time. GALLBLADDER AND BILE DUCTS: Interval transhepatic percutaneous cholecystostomy has been performed with decompression of the gallbladder. The retaining loop of the drainage catheter appears well positioned within the decompressed gallbladder lumen. No biliary ductal dilatation. SPLEEN: The spleen is unremarkable. PANCREAS: Infiltration surrounding the tail of the pancreas, however, appears new and is nonspecific. This may represent infiltrative hemorrhage related to fluid within the lesser sac though superimposed acute interstitial / edematous pancreatitis could appear similarly. The pancreatic duct does not dilated. There is normal enhancement of the pancreatic parenchyma. 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 The left  adrenal gland demonstrates no acute abnormality. KIDNEYS, URETERS AND BLADDER: Marked right renal cortical atrophy. Simple cortical cyst within the left kidney for which no follow up imaging is recommended. No stones in the kidneys or ureters. No hydronephrosis. No perinephric or periureteral stranding. The bladder is partially obscured by streak artifact, however, the  visualized portion is unremarkable. GI AND BOWEL: Hi attenuation fluid collection adjacent to the greater curvature of the stomach represents hemoperitoneum within the lesser sac. Severe descending and sigmoid colonic diverticulosis without superimposed acute inflammatory change. Stomach and duodenal sweep demonstrate no acute abnormality. There is no bowel obstruction. No abnormal bowel wall thickening or distension. REPRODUCTIVE: Reproductive organs are unremarkable. PERITONEUM AND RETROPERITONEUM: Hi attenuation fluid collection adjacent to the greater curvature of the stomach represents hemoperitoneum within the lesser sac. A sentinel clot seen within the right posterior perihepatic region extending into the subdiaphragmatic region and inferiorly into Morison's pouch. No free air. LUNG BASE: Small bilateral pleural effusions are now seen at the lung bases with associated bibasilar compressive atelectasis. LYMPH NODES: No lymphadenopathy. BONES AND SOFT TISSUES: Status post right total hip arthroplasty. Osseous structures are age appropriate. No acute bone abnormality. No lytic or blastic bone lesion. No acute soft tissue abnormality. HEART AND MEDIASTINUM: New hypoattenuation of the cardiac blood pool in keeping with at least moderate anemia. Extensive coronary artery calcification. Calcification of the aortic valve leaflets. Mild cardiomegaly. IMPRESSION: 1. No active extravasation arising from the transhepatic puncture at this time. No active gastrointestinal hemorrhage. 2. Hemoperitoneum, including sentinel clot within the right posterior perihepatic region extending into the subdiaphragmatic region and Morison pouch, and hemoperitoneum within the lesser sac. Given the interval cholecystostomy, this is favored to represent intraperitoneal hemorrhage related to the transhepatic puncture. 3. Interval transhepatic percutaneous cholecystostomy with decompression of the gallbladder and drainage catheter  appropriately positioned. 4. New hypoattenuation of the cardiac blood pool, consistent with at least moderate anemia. 5. Nonspecific infiltration surrounding the tail of the pancreas, possibly representing infiltrative hemorrhage or superimposed acute interstitial/edematous pancreatitis. Correlation with serum amylase and lipase levels would be helpful for confirmation. 6. Small bilateral pleural effusions with associated bibasilar compressive atelectasis. 7. Extensive coronary artery calcification. 8. Mild cardiomegaly. 9. Marked right renal cortical atrophy. 10. Severe descending and sigmoid colonic diverticulosis without acute inflammatory change. 11. Status post right total hip arthroplasty. Electronically signed by: Dorethia Molt MD 03/08/2024 07:59 PM EST RP Workstation: [id number]   IR NON-TUNNELED CENTRAL VENOUS CATH [facility] W IMG Result Date: 03/05/2024 EXAM: Central venous catheter placement (non-tunneled) COMPARISON: none CLINICAL HISTORY: renal failure, needs access for dialysis. Complications: No immediate complications. Conscious Sedation: none Radiation dose reduction techniques were utilized as appropriate. Radiation exposure index (as provided by the fluoroscopic device): 1.6 mGy air kerma. Discussion: Time out performed including verification of patient, date of birth, procedure, and access site as appropriate. Informed written consent was obtained. The access site was prepared and draped using maximal sterile barrier technique including cutaneous antisepsis. The patient was positioned supine. Initial imaging was performed. Venous access: Access site: internal jugular Laterality: Right Indication for catheter placement: Dialysis Local anesthesia was administered. Under ultrasound guidance, venous access was obtained. A guidewire was advanced and a non-tunneled central venous catheter was placed using standard Seldinger technique. Catheter details: Catheter type: Trialysis Catheter length: 16cm  Catheter tip position confirmed with imaging. The catheter was secured and a sterile dressing was applied. All lumens aspirated and flushed appropriately. Post-procedure imaging: Immediate post-placement imaging: Fluoroscopy Post-procedure imaging findings: tip proximal RA  Radiation exposure index (as provided by the fluoroscopic device): 1.6 mgy air kerma Contrast: Contrast agent: None. Contrast volume: 0 mL. Estimated blood loss: Less than 10 mL. IMPRESSION: 1. Technically successful placement of a non-tunneled central venous dialysis catheter. 2. No immediate complications. Electronically signed by: Katheleen Faes MD 03/05/2024 09:46 AM EST RP Workstation: [id number]   ECHOCARDIOGRAM COMPLETE Result Date: 03/04/2024    ECHOCARDIOGRAM REPORT   Patient Name:   [name] Date of Exam: 03/04/2024 Medical Rec #:  [id number]          Height:       73.0 in Accession #:    [id number]         Weight:       191.1 lb Date of Birth:  17-Jan-1946         BSA:          2.110 m Patient Age:    78 years           BP:           114/60 mmHg Patient Gender: M                  HR:           90 bpm. Exam Location:  Inpatient Procedure: 2D Echo, Cardiac Doppler and Color Doppler (Both Spectral and Color            Flow Doppler were utilized during procedure). Indications:    Ventricular Tachycardia  History:        Patient has prior history of Echocardiogram examinations, most                 recent 03/29/2022. Risk Factors:Hypertension and Dyslipidemia.  Sonographer:    Sherlean Dubin Referring Phys: [id number] MADONNA LARGE IMPRESSIONS  1. Left ventricular ejection fraction, by estimation, is 55 to 60%. The left ventricle has normal function. The left ventricle has no regional wall motion abnormalities. There is mild concentric left ventricular hypertrophy. Left ventricular diastolic parameters are consistent with Grade I diastolic dysfunction (impaired relaxation).  2. Right ventricular systolic function is normal. The right  ventricular size is mildly enlarged.  3. Right atrial size was mildly dilated.  4. The mitral valve is normal in structure. No evidence of mitral valve regurgitation. No evidence of mitral stenosis.  5. The aortic valve is tricuspid. There is moderate calcification of the aortic valve. Aortic valve regurgitation is not visualized. Aortic valve sclerosis/calcification is present, without any evidence of aortic stenosis.  6. The inferior vena cava is normal in size with greater than 50% respiratory variability, suggesting right atrial pressure of 3 mmHg. FINDINGS  Left Ventricle: Left ventricular ejection fraction, by estimation, is 55 to 60%. The left ventricle has normal function. The left ventricle has no regional wall motion abnormalities. The left ventricular internal cavity size was normal in size. There is  mild concentric left ventricular hypertrophy. Left ventricular diastolic parameters are consistent with Grade I diastolic dysfunction (impaired relaxation). Right Ventricle: The right ventricular size is mildly enlarged. No increase in right ventricular wall thickness. Right ventricular systolic function is normal. Left Atrium: Left atrial size was normal in size. Right Atrium: Right atrial size was mildly dilated. Pericardium: There is no evidence of pericardial effusion. Mitral Valve: The mitral valve is normal in structure. No evidence of mitral valve regurgitation. No evidence of mitral valve stenosis. Tricuspid Valve: The tricuspid valve is normal in structure. Tricuspid valve regurgitation is mild . No evidence of tricuspid  stenosis. Aortic Valve: The aortic valve is tricuspid. There is moderate calcification of the aortic valve. Aortic valve regurgitation is not visualized. Aortic valve sclerosis/calcification is present, without any evidence of aortic stenosis. Aortic valve mean gradient measures 8.0 mmHg. Aortic valve peak gradient measures 14.0 mmHg. Aortic valve area, by VTI measures 2.15 cm.  Pulmonic Valve: The pulmonic valve was normal in structure. Pulmonic valve regurgitation is trivial. No evidence of pulmonic stenosis. Aorta: The aortic root is normal in size and structure. Venous: The inferior vena cava is normal in size with greater than 50% respiratory variability, suggesting right atrial pressure of 3 mmHg. IAS/Shunts: No atrial level shunt detected by color flow Doppler.  LEFT VENTRICLE PLAX 2D LVIDd:         5.10 cm   Diastology LVIDs:         3.50 cm   LV e' medial:    8.86 cm/s LV PW:         0.90 cm   LV E/e' medial:  6.8 LV IVS:        1.20 cm   LV e' lateral:   8.55 cm/s LVOT diam:     2.20 cm   LV E/e' lateral: 7.1 LV SV:         76 LV SV Index:   36 LVOT Area:     3.80 cm  RIGHT VENTRICLE             IVC RV Basal diam:  3.20 cm     IVC diam: 1.70 cm RV Mid diam:    2.70 cm RV S prime:     13.10 cm/s TAPSE (M-mode): 1.2 cm LEFT ATRIUM             Index        RIGHT ATRIUM           Index LA diam:        4.10 cm 1.94 cm/m   RA Area:     17.20 cm LA Vol (A2C):   46.7 ml 22.13 ml/m  RA Volume:   43.20 ml  20.47 ml/m LA Vol (A4C):   39.0 ml 18.48 ml/m LA Biplane Vol: 44.0 ml 20.85 ml/m  AORTIC VALVE AV Area (Vmax):    2.06 cm AV Area (Vmean):   2.07 cm AV Area (VTI):     2.15 cm AV Vmax:           187.00 cm/s AV Vmean:          128.500 cm/s AV VTI:            0.354 m AV Peak Grad:      14.0 mmHg AV Mean Grad:      8.0 mmHg LVOT Vmax:         101.50 cm/s LVOT Vmean:        70.050 cm/s LVOT VTI:          0.200 m LVOT/AV VTI ratio: 0.56  AORTA Ao Root diam: 3.40 cm Ao Asc diam:  3.50 cm MITRAL VALVE               TRICUSPID VALVE MV Area (PHT): 4.15 cm    TR Peak grad:   27.5 mmHg MV Decel Time: 183 msec    TR Vmax:        262.00 cm/s MR Peak grad: 6.2 mmHg MR Vmax:      125.00 cm/s  SHUNTS MV E velocity: 60.40 cm/s  Systemic VTI:  0.20 m  MV A velocity: 38.60 cm/s  Systemic Diam: 2.20 cm MV E/A ratio:  1.56 Toribio Fuel MD Electronically signed by Toribio Fuel MD Signature  Date/Time: 03/04/2024/10:39:24 PM    Final    DG CHEST PORT 1 VIEW Result Date: 03/04/2024 CLINICAL DATA:  Shortness of breath. EXAM: PORTABLE CHEST 1 VIEW COMPARISON:  Lung bases from abdominopelvic CT 03/01/2024 FINDINGS: Elevated right hemidiaphragm with patchy opacity at the right lung base. Streaky left basilar densities. The heart is upper normal in size. Aortic atherosclerosis. Rounded right paratracheal density. No pneumothorax or large pleural effusion. IMPRESSION: 1. Elevated right hemidiaphragm with patchy opacity at the right lung base, atelectasis versus pneumonia. 2. Streaky left basilar densities, favor atelectasis. 3. Rounded right paratracheal density, tortuous vasculature versus adenopathy. Recommend further evaluation with contrast-enhanced chest CT. Electronically Signed   By: Andrea Gasman M.D.   On: 03/04/2024 21:55   IR Perc Cholecystostomy Result Date: 03/03/2024 INDICATION: Acute cholecystitis, non operative candidate EXAM: ULTRASOUND FLUOROSCOPIC PERCUTANEOUS TRANSHEPATIC CHOLECYSTOSTOMY MEDICATIONS: Patient is already receiving IV antibiotics as an inpatient ANESTHESIA/SEDATION: Moderate (conscious) sedation was employed during this procedure. A total of Versed  1.0 mg and Fentanyl  50 mcg was administered intravenously by the radiology nurse. Total intra-service moderate Sedation Time: 10 minutes. The patient's level of consciousness and vital signs were monitored continuously by radiology nursing throughout the procedure under my direct supervision. FLUOROSCOPY: Radiation Exposure Index (as provided by the fluoroscopic device): 1.4 mGy Kerma COMPLICATIONS: None immediate. PROCEDURE: Informed written consent was obtained from the patient after a thorough discussion of the procedural risks, benefits and alternatives. All questions were addressed. Maximal Sterile Barrier Technique was utilized including caps, mask, sterile gowns, sterile gloves, sterile drape, hand hygiene and skin  antiseptic. A timeout was performed prior to the initiation of the procedure. Previous imaging reviewed. Preliminary ultrasound performed. The distended gallbladder was localized in the right upper quadrant. Lower intercostal space marked. Under sterile conditions and local anesthesia, percutaneous needle access performed through a transhepatic window with an 18 gauge 15 cm trocar needle. Images obtained for documentation. There was return of blood tinged bile. Sample sent for culture. Guidewire inserted followed by tract dilatation to insert a 10 French drain. Drain catheter position confirmed with ultrasound and fluoroscopy. Images again obtained for documentation. Gallbladder decompressed by syringe aspiration removing 80 cc. Catheter secured with a Prolene suture and a sterile dressing. Gravity drainage bag connected. No immediate complication. Patient tolerated the procedure well. IMPRESSION: Successful ultrasound and fluoroscopic percutaneous transhepatic cholecystostomy Electronically Signed   By: CHRISTELLA.  Shick M.D.   On: 03/03/2024 09:28   CT ABDOMEN PELVIS WO CONTRAST Result Date: 03/01/2024 CLINICAL DATA:  Abdominal pain, acute, nonlocalized R abd pain, renal failure. EXAM: CT ABDOMEN AND PELVIS WITHOUT CONTRAST TECHNIQUE: Multidetector CT imaging of the abdomen and pelvis was performed following the standard protocol without IV contrast. RADIATION DOSE REDUCTION: This exam was performed according to the departmental dose-optimization program which includes automated exposure control, adjustment of the mA and/or kV according to patient size and/or use of iterative reconstruction technique. COMPARISON:  None Available. FINDINGS: Lower chest: There is ground-glass opacity in the right lower lobe, concerning for pneumonitis. There also associated linear areas of atelectasis/scarring in the right lower lobe. No pleural effusion. Mildly enlarged heart size. No pericardial effusion. There are coronary artery  atherosclerotic calcifications, in keeping with coronary artery disease. Hepatobiliary: The liver is normal in size. Non-cirrhotic configuration. No suspicious mass. No intrahepatic or extrahepatic bile duct dilation. No calcified choledocholithiasis. The gallbladder  is distended measuring up to 5.4 cm in width and exhibit mild-to-moderate circumferential wall thickening and moderate pericholecystic fat stranding. There are several subcentimeter sized faintly calcified gallstones. There are few prominent pericholecystic lymph nodes. Findings are compatible with acute cholecystitis. Pancreas: Unremarkable. No pancreatic ductal dilatation or surrounding inflammatory changes. Spleen: Within normal limits. No focal lesion. Adrenals/Urinary Tract: Unremarkable left adrenal gland. 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 Asymmetrically moderately atrophic right kidney noted. There is right extrarenal pelvis. There is mild fullness in the right renal collecting system. No right nephroureterolithiasis. There is a partially exophytic simple cyst arising from the left kidney interpolar region, posteriorly measuring 4.8 x 6.4 cm. There is a partially exophytic 2.5 x 2.7 cm soft tissue mass arising from the left kidney upper pole, which cannot be characterized as a simple cyst on this exam. There is indeterminate. Further evaluation with multiphasic contrast-enhanced MRI abdomen as per renal mass protocol is recommended. No left nephroureterolithiasis or obstructive uropathy. Stomach/Bowel: There is a small sliding hiatal hernia. There is a diverticulum arising from the second part of duodenum. No disproportionate dilation of the small or large bowel loops. Unremarkable appendix. 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 There is mild asymmetric thickening of the hepatic flexure of colon which is likely secondary to inflammation from acute cholecystitis. Vascular/Lymphatic: No ascites or  pneumoperitoneum. No abdominal or pelvic lymphadenopathy, by size criteria. No aneurysmal dilation of the major abdominal arteries. There are moderate peripheral atherosclerotic vascular calcifications of the aorta and its major branches. Reproductive: Normal size prostate. Symmetric seminal vesicles. Other: There is a small fat containing umbilical hernia and bilateral small fat containing inguinal hernias. The soft tissues and abdominal wall are otherwise unremarkable. Musculoskeletal: No suspicious osseous lesions. There are mild - moderate multilevel degenerative changes in the visualized spine. IMPRESSION: 1. Findings compatible with acute cholecystitis. No calcified choledocholithiasis. 2. There is a 2.5 x 2.7 cm soft tissue mass arising from the left kidney upper pole, which cannot be characterized as a simple cyst on this exam. Further evaluation with multiphasic contrast-enhanced MRI abdomen as per renal mass protocol is recommended. 3. 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 4. Ground-glass opacity in the right lower lobe, concerning for pneumonitis. Correlate clinically. 5. Multiple other nonacute observations, as described above. Aortic Atherosclerosis (ICD10-I70.0). Electronically Signed   By: Ree Molt M.D.   On: 03/01/2024 17:22    Microbiology: No results found for this or any previous visit (from the past 240 hours).   Labs: Basic Metabolic Panel: Recent Labs  Lab 03/10/24 0718 03/12/24 0600 03/13/24 0947 03/14/24 0046 03/15/24 0254  NA 135 138 137 139 140  K 4.4 4.0 3.8 3.9 4.0  CL 99 101 101 102 102  CO2 25 29 30 30 29   GLUCOSE 102* 98 116* 97 101*  BUN 41* 30* 34* 35* 33*  CREATININE 4.70* 3.24* 3.20* 2.95* 2.55*  CALCIUM  7.4* 7.6* 7.7* 7.8* 8.2*   Liver Function Tests: Recent Labs  Lab 03/10/24 0718 03/13/24 0947  AST 43* 35  ALT 24 20  ALKPHOS 65 79  BILITOT 1.0 1.0  PROT 5.2* 5.3*  ALBUMIN 2.7* 2.8*   No results for input(s): LIPASE, AMYLASE  in the last 168 hours. No results for input(s): AMMONIA in the last 168 hours. CBC: Recent Labs  Lab 03/11/24 1748 03/12/24 0600 03/13/24 0947 03/13/24 1712 03/15/24 0254  WBC 13.0* 10.0 8.3 10.1 10.6*  NEUTROABS  --   --   --   --  8.4*  HGB 8.3* 8.1* 7.9* 8.7* 9.9*  HCT 25.1* 24.8* 23.9* 26.9* 30.9*  MCV 91.6 91.9 92.3 94.4 93.1  PLT 111* 109* 111* 126* 148*   Cardiac Enzymes: No results for input(s): CKTOTAL, CKMB, CKMBINDEX, TROPONINI in the last 168 hours. BNP: BNP (last 3 results) No results for input(s): BNP in the last 8760 hours.  ProBNP (last 3 results) Recent Labs    03/04/24 1129  PROBNP 18,836.0*    CBG: Recent Labs  Lab 03/09/24 1600  GLUCAP 103*    Signed:  Colen Grimes MD   [facility] 03/16/2024, 9:41 AM      [1]  Allergies Allergen Reactions   Advil [Ibuprofen] Other (See Comments)    Hx of bleeding ulcer   Choline Fenofibrate Other (See Comments)    ABDOMINAL PAIN, CONSTIPATION

## 2024-03-16 NOTE — Progress Notes (Addendum)
 Unable to round on this patient until after completion of procedures at this time. Appears patient was discharged prior to this time. Called patient to verify his comfort with plan for drain. He tells me that he would prefer to speak more about this on Monday. I did verify with him that he knows he needs to empty the drain collection at least daily or as needed and that he is continuing to have thin, bilious output. Will touch base on Monday.

## 2024-03-16 NOTE — Progress Notes (Signed)
 Reviewed AVS, patient expressed understanding of medications, MD follow up reviewed.   See LDA for information on wounds at discharge. Patient states all belongings brought to the hospital at time of admission are accounted for and packed to take home.  Picked up medications from [facility] pharmacy. Nursing staff contacted to transport patient to entrance A,where family member was waiting in vehicle to transport home.

## 2024-03-19 ENCOUNTER — Telehealth: Payer: Self-pay

## 2024-03-19 ENCOUNTER — Other Ambulatory Visit (HOSPITAL_COMMUNITY): Payer: Self-pay

## 2024-03-19 MED ORDER — NORMAL SALINE FLUSH 0.9 % IV SOLN
5.0000 mL | Freq: Every day | INTRAVENOUS | 0 refills | Status: AC
  Filled 2024-03-19: qty 600, 60d supply, fill #0

## 2024-03-19 NOTE — Telephone Encounter (Signed)
 Spoke with patient stated his BP running 118/48 HR 47 on 1/05/20261 at night  119/68 Hr 50 today  Not taking any medication since hospital discharge  Patient has an appt on 03/26/2023 Still with swelling on feet

## 2024-03-19 NOTE — Telephone Encounter (Signed)
 Noted. He can continue to monitor BP for now. Leg swelling should improve over the next several days though he should try to keep legs elevated if possible.

## 2024-03-19 NOTE — Telephone Encounter (Signed)
 Copied from CRM [id number]. Topic: Appointments - Appointment Scheduling >> Mar 19, 2024  9:41 AM [name] wrote: Patient is asking for the doctor to call him he has a few questions

## 2024-03-19 NOTE — Telephone Encounter (Signed)
 Patient return call advise to continue monitor BP, swelling should improve over next several days try to keep legs elevated if possible   Home health care Need VO for wound care and evaluation on wound  Home health [phone] Please advise  Patient number [phone]

## 2024-03-19 NOTE — Telephone Encounter (Signed)
 Ok with me. Please place any necessary orders.

## 2024-03-20 ENCOUNTER — Telehealth (HOSPITAL_COMMUNITY): Payer: Self-pay | Admitting: Student

## 2024-03-20 ENCOUNTER — Other Ambulatory Visit (HOSPITAL_COMMUNITY): Payer: Self-pay | Admitting: Student

## 2024-03-20 DIAGNOSIS — K810 Acute cholecystitis: Secondary | ICD-10-CM

## 2024-03-20 NOTE — Telephone Encounter (Signed)
 Patient notified that a prescription for saline flushes was sent in yesterday to the [facility] outpatient pharmacy. Patient stated he will pick them up tomorrow. Patient aware he needs to flush the drain once daily with 5-10 ml NS.   He was encouraged to call our team with any questions/concerns.  Warren Dais, AGACNP-BC 03/20/2024, 4:09 PM

## 2024-03-20 NOTE — Telephone Encounter (Signed)
 Called Home health [phone]  Unable to LM, [facility] nurse will return call

## 2024-03-20 NOTE — Telephone Encounter (Signed)
 Called Home health [phone]  VO given

## 2024-03-21 ENCOUNTER — Telehealth: Payer: Self-pay

## 2024-03-21 NOTE — Telephone Encounter (Signed)
 Can we have him come in for office visit to discuss?

## 2024-03-21 NOTE — Telephone Encounter (Signed)
 Please advise on how to proceed with treatment plan. Please and thank you   Copied from CRM [id number]. Topic: Clinical - Medication Question >> Mar 20, 2024  4:55 PM Rea ORN wrote: Reason for CRM: Jenna with [facility] called to advise pt was told not to take any of his medication when he left the hospital. Pt has hospital follow up on Monday.Andriette stated his bp was great, 122/72, his heart rate was 64. Jenna is asking for clinic to call to advise if he should be taking meds. Jenna said he had +2 pitting edema in feet. She advised him to elevate his feet to help.   Please call pt back to advise, [phone] If there are any additional question for Jenna, please call [phone]

## 2024-03-21 NOTE — Telephone Encounter (Signed)
Please schedule an OV with PCP  

## 2024-03-22 NOTE — Telephone Encounter (Signed)
 [facility] on 03/25/24

## 2024-03-25 ENCOUNTER — Ambulatory Visit: Admitting: Family Medicine

## 2024-03-25 ENCOUNTER — Encounter: Payer: Self-pay | Admitting: Family Medicine

## 2024-03-25 VITALS — BP 122/78 | HR 55 | Temp 98.2°F | Ht 73.0 in | Wt 182.0 lb

## 2024-03-25 DIAGNOSIS — N179 Acute kidney failure, unspecified: Secondary | ICD-10-CM

## 2024-03-25 DIAGNOSIS — K819 Cholecystitis, unspecified: Secondary | ICD-10-CM

## 2024-03-25 DIAGNOSIS — I10 Essential (primary) hypertension: Secondary | ICD-10-CM | POA: Diagnosis not present

## 2024-03-25 DIAGNOSIS — N170 Acute kidney failure with tubular necrosis: Secondary | ICD-10-CM

## 2024-03-25 LAB — COMPREHENSIVE METABOLIC PANEL WITH GFR
ALT: 7 U/L (ref 3–53)
AST: 10 U/L (ref 5–37)
Albumin: 3.3 g/dL — ABNORMAL LOW (ref 3.5–5.2)
Alkaline Phosphatase: 66 U/L (ref 39–117)
BUN: 24 mg/dL — ABNORMAL HIGH (ref 6–23)
CO2: 30 meq/L (ref 19–32)
Calcium: 8.8 mg/dL (ref 8.4–10.5)
Chloride: 102 meq/L (ref 96–112)
Creatinine, Ser: 1.63 mg/dL — ABNORMAL HIGH (ref 0.40–1.50)
GFR: 40.76 mL/min — ABNORMAL LOW
Glucose, Bld: 111 mg/dL — ABNORMAL HIGH (ref 70–99)
Potassium: 4 meq/L (ref 3.5–5.1)
Sodium: 138 meq/L (ref 135–145)
Total Bilirubin: 1.5 mg/dL — ABNORMAL HIGH (ref 0.2–1.2)
Total Protein: 6.8 g/dL (ref 6.0–8.3)

## 2024-03-25 LAB — CBC
HCT: 35.2 % — ABNORMAL LOW (ref 39.0–52.0)
Hemoglobin: 11.7 g/dL — ABNORMAL LOW (ref 13.0–17.0)
MCHC: 33.2 g/dL (ref 30.0–36.0)
MCV: 89.7 fl (ref 78.0–100.0)
Platelets: 181 K/uL (ref 150.0–400.0)
RBC: 3.92 Mil/uL — ABNORMAL LOW (ref 4.22–5.81)
RDW: 15.9 % — ABNORMAL HIGH (ref 11.5–15.5)
WBC: 8 K/uL (ref 4.0–10.5)

## 2024-03-25 NOTE — Progress Notes (Signed)
 "   Chief Complaint:  [name] is a 79 y.o. male who presents today for a TCM visit.  Assessment/Plan:  New/Acute Problems: Cholecystitis  Patient is recovering from recent acute cholecystitis.  Still has drainage tube in place and thus will not be able to be removed for several more weeks.  Does occasionally get pain when flushing out the area but overall symptoms do seem to be improving.  Will defer further management to his surgery team.  We discussed reasons to return to care  AKI Secondary to severe sepsis secondary to cholecystitis as above.  No signs of recurrent infection today.  He will be seeing the nephrologist next week.  No longer has his HD catheter in his neck.  Will defer further management to nephrology though we will recheck labs today  Kidney mass Incidentally found on imaging in the ED.  Will defer further management to nephrology next week though advised patient to let us  know if they do not order any additional testing and we can discuss this at his next visit here.  Acute blood loss anemia Recheck CBC today.  Drainage from his cholecystostomy is not producing any bloody discharge.  Frequent PVCs Zio patch is in place.  He will be seeing cardiology next month.  Chronic Problems Addressed Today: Essential hypertension At goal today without meds.  He will stay off all antihypertensives for now but will monitor at home and let us  know if he has any persistent elevations.    Subjective:  HPI:  Summary of Hospital admission: Reason for admission: Severe sepsis secondary to acute cholecystitis Date of admission: 03/01/2024 Date of discharge: 03/16/2024 Date of Interactive contact: N/A Summary of Hospital course: He went to the ED on 03/01/2024 with abdominal pain, diarrhea, and vomiting.  In the ED was found to have acute cholecystitis and AKI.  General surgery was consulted and nephrology was consulted because of the severity of his AKI.  Underwent  cholecystostomy on 03/03/2021.  Postoperative course was complicated by continuing AKI/ATN and he had a right internal jugular trialysis catheter placed on 03/05/2025.  He was found to be anemic postoperatively and was given a total of 6 units PRBC.  A biliary drain was placed postoperatively and was sent home with this to follow-up with general surgery and IR.  A Zio patch monitor was also sent due to frequent PVCs during admission.  There was concern for sepsis secondary to acute cholecystitis and he was given Zosyn  during his admission.  Interim history:    Discussed the use of AI scribe software for clinical note transcription with the patient, who gave verbal consent to proceed.  History of Present Illness [name] is a 79 year old male who presents for hospitalization follow-up after treatment for acute cholecystitis and acute kidney injury.  He was hospitalized on March 01, 2024, due to abdominal pain, diarrhea, and vomiting, and was diagnosed with acute cholecystitis and acute kidney injury (AKI). During his hospital stay, general surgery and nephrology were consulted. He was scheduled for a cholecystectomy but has not yet undergone the procedure and is awaiting surgery. A right internal jugular dialysis catheter was placed on March 05, 2024, and he underwent dialysis five times. Postoperatively, he was found to be anemic and received a total of six units of packed red blood cells (PRBC).  A biliary drain was placed postoperatively, and he was discharged with instructions to follow up with general surgery and interventional radiology. He initially had difficulty managing  the drain and went a week without flushing it due to lack of instructions. After flushing, he experienced bloating and tenderness, which he attributes to bile buildup. His urine output had decreased significantly during his illness, turning from bright yellow to orange, and eventually drying up. However, he is now  urinating regularly with 'nice, pretty yellow' urine every two hours.  He experienced severe sepsis secondary to acute cholecystitis and was treated with Zosyn  during his hospitalization. He feels very tired and has a side that 'hurts a little' due to the tube. He has an appointment with [facility] next week and is scheduled for further lab work. He is also wearing a heart monitor due to irregular heartbeats and has an upcoming appointment with a cardiologist.  He reports a decreased appetite, feeling bloated after eating, and difficulty drinking large amounts of fluids. He finds it easier to consume crushed ice and small meals like a hard-boiled egg and a piece of bacon in the morning.     ROS: Per HPI, otherwise a complete review of systems was negative.   PMH:  The following were reviewed and entered/updated in epic: Past Medical History:  Diagnosis Date   Arthritis    Cancer (HCC)    skin   COLONIC POLYPS, HX OF 09/13/2007   Qualifier: Diagnosis of  By: Tish MD, Elsie   Dr Oliva Boots, GI Colonoscopy @ 5 year intervals     DUODENAL ULCER, HX OF 09/13/2007   Qualifier: Diagnosis of  By: Tish MD, Elsie     GERD (gastroesophageal reflux disease)    Hyperlipidemia    Hypertension    Right bundle branch block 02/21/2022   SKIN CANCER, HX OF 09/01/2009   Qualifier: Diagnosis of  By: Tish MD, Elsie [place] [facility]  X 3-4; [place]  [facility]     Ulcer 1989 & 2000   bleedng X2   Patient Active Problem List   Diagnosis Date Noted   Leg edema 04/20/2023   Primary hyperparathyroidism (HCC) s/p hyperparathyroidectomy 2024 11/04/2021   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   Hyperglycemia 10/24/2019   Unilateral primary osteoarthritis, right hip 10/23/2019   Erectile dysfunction 10/15/2018   Plantar wart 04/06/2018   Essential tremor 04/06/2018   Dyslipidemia 03/26/2018   Essential hypertension 03/23/2018   History of colon polyps 03/23/2018   History of  peptic ulcer 03/23/2018   Low back pain 03/23/2018   Past Surgical History:  Procedure Laterality Date   colonoscopy with polypectomy     Dr Boots   EYE SURGERY     post trauma with glss fragments   INGUINAL HERNIA REPAIR  1970   IR ANGIOGRAM SELECTIVE EACH ADDITIONAL VESSEL  03/10/2024   IR ANGIOGRAM VISCERAL SELECTIVE  03/10/2024   IR AORTA/THORACIC  03/10/2024   IR EXCHANGE BILIARY DRAIN  03/10/2024   IR PERC CHOLECYSTOSTOMY  03/03/2024   IR TUNNELED CENTRAL VENOUS CATH PLC W IMG  03/05/2024   IR US  GUIDE VASC ACCESS RIGHT  03/10/2024   PARATHYROIDECTOMY Right 03/21/2022   Procedure: RIGHT INFERIOR PARATHYROIDECTOMY;  Surgeon: Eletha Boas, MD;  Location: [facility];  Service: General;  Laterality: Right;   pilonidal cystectomy     TOTAL HIP ARTHROPLASTY Right 12/06/2019   Procedure: RIGHT TOTAL HIP ARTHROPLASTY ANTERIOR APPROACH;  Surgeon: Vernetta Lonni GRADE, MD;  Location: [facility];  Service: Orthopedics;  Laterality: Right;    Family History  Problem Relation Age of Onset   Stroke Mother  late 72s   Diabetes Brother        Type II   Hypertension Brother    Hyperlipidemia Brother    Heart disease Neg Hx    Cancer Neg Hx     Medications- Reconciled discharge and current medications in Epic.  Current Outpatient Medications  Medication Sig Dispense Refill   acetaminophen  (TYLENOL ) 325 MG tablet Take 2 tablets (650 mg total) by mouth every 6 (six) hours as needed for mild pain (pain score 1-3) or fever (or Fever >/= 101). (Patient not taking: Reported on 03/25/2024)     atorvastatin  (LIPITOR) 40 MG tablet TAKE 1 TABLET BY MOUTH EVERY DAY (Patient not taking: Reported on 03/25/2024) 90 tablet 3   lidocaine  (LIDODERM ) 5 % Place 1 patch onto the skin daily. Remove & Discard patch within 12 hours or as directed by MD (Patient not taking: Reported on 03/25/2024) 30 patch 0   Sodium Chloride  Flush (NORMAL SALINE FLUSH) 0.9 % SOLN Use 5 mLs daily as directed. (Patient not taking:  Reported on 03/25/2024) 600 mL 0   No current facility-administered medications for this visit.    Allergies-reviewed and updated Allergies[1]  Social History   Socioeconomic History   Marital status: Married    Spouse name: Not on file   Number of children: 2   Years of education: 16   Highest education level: Associate degree: occupational, scientist, product/process development, or vocational program  Occupational History   Occupation: Retired   Tobacco Use   Smoking status: Former   Smokeless tobacco: Former    Types: Associate Professor status: Never Used  Substance and Sexual Activity   Alcohol use: No   Drug use: No   Sexual activity: Not on file  Other Topics Concern   Not on file  Social History Narrative   Not on file   Social Drivers of Health   Tobacco Use: Medium Risk (03/25/2024)   Patient History    Smoking Tobacco Use: Former    Smokeless Tobacco Use: Former    Passive Exposure: Not on Actuary Strain: Low Risk (12/22/2023)   Overall Financial Resource Strain (CARDIA)    Difficulty of Paying Living Expenses: Not hard at all  Food Insecurity: No Food Insecurity (03/01/2024)   Epic    Worried About Programme Researcher, Broadcasting/film/video in the Last Year: Never true    Ran Out of Food in the Last Year: Never true  Transportation Needs: No Transportation Needs (03/01/2024)   Epic    Lack of Transportation (Medical): No    Lack of Transportation (Non-Medical): No  Physical Activity: Insufficiently Active (12/22/2023)   Exercise Vital Sign    Days of Exercise per Week: 2 days    Minutes of Exercise per Session: 20 min  Stress: No Stress Concern Present (12/22/2023)   [employer] of Occupational Health - Occupational Stress Questionnaire    Feeling of Stress: Not at all  Social Connections: Socially Integrated (03/01/2024)   Social Connection and Isolation Panel    Frequency of Communication with Friends and Family: More than three times a week    Frequency of Social  Gatherings with Friends and Family: Three times a week    Attends Religious Services: More than 4 times per year    Active Member of Clubs or Organizations: Yes    Attends Banker Meetings: More than 4 times per year    Marital Status: Married  Depression (PHQ2-9): Low Risk (03/25/2024)  Depression (PHQ2-9)    PHQ-2 Score: 0  Alcohol Screen: Not on file  Housing: Low Risk (03/01/2024)   Epic    Unable to Pay for Housing in the Last Year: No    Number of Times Moved in the Last Year: 0    Homeless in the Last Year: No  Utilities: Not At Risk (03/01/2024)   Epic    Threatened with loss of utilities: No  Health Literacy: Not on file        Objective:  Physical Exam: BP 122/78   Pulse (!) 55   Temp 98.2 F (36.8 C) (Temporal)   Ht 6' 1 (1.854 m)   Wt 182 lb (82.6 kg)   SpO2 93%   BMI 24.01 kg/m   Gen: NAD, resting comfortably CV: RRR with no murmurs appreciated Pulm: NWOB, CTAB with no crackles, wheezes, or rhonchi GI: Normal bowel sounds present. Soft, Nontender, Nondistended.  Cholecystostomy tube in place draining yellow bilious appearing discharge MSK: No edema, cyanosis, or clubbing noted Skin: Warm, dry Neuro: Grossly normal, moves all extremities Psych: Normal affect and thought content  Time Spent: 45 minutes of total time was spent on the date of the encounter performing the following actions: chart review prior to seeing the patient including her recent hospitalization, obtaining history, performing a medically necessary exam, counseling on the treatment plan, placing orders, and documenting in our EHR.        Worth HERO. Kennyth, MD 03/25/2024 1:40 PM     [1]  Allergies Allergen Reactions   Advil [Ibuprofen] Other (See Comments)    Hx of bleeding ulcer   Choline Fenofibrate Other (See Comments)    ABDOMINAL PAIN, CONSTIPATION   "

## 2024-03-25 NOTE — Assessment & Plan Note (Signed)
 At goal today without meds.  He will stay off all antihypertensives for now but will monitor at home and let us  know if he has any persistent elevations.

## 2024-03-25 NOTE — Patient Instructions (Signed)
 It was very nice to see you today!  VISIT SUMMARY: You had a follow-up visit after your hospitalization for acute cholecystitis and acute kidney injury. You are recovering well and have several follow-up appointments scheduled to monitor your progress.  YOUR PLAN: ACUTE CHOLECYSTITIS WITH BILIARY DRAIN: You had a gallbladder infection that led to severe sepsis, and a biliary drain was placed to help with bile drainage. -Continue daily flushing of the biliary drain with saline. -Avoid fatty foods. -Follow up with general surgery and interventional radiology as scheduled. -You are scheduled for a cholecystectomy on February 20th, 2026.  ACUTE KIDNEY INJURY WITH TUBULAR NECROSIS: Your kidneys were injured due to severe illness and sepsis, and you required dialysis during your hospital stay. -Blood work has been ordered to monitor kidney function. -Follow up with nephrology next week.  ANEMIA SECONDARY TO BLOOD LOSS: You experienced significant blood loss during your hospitalization and received multiple blood transfusions. -Blood work has been ordered to monitor your blood counts.  PREMATURE VENTRICULAR CONTRACTIONS: You had irregular heartbeats during your hospitalization, likely related to your severe illness and sepsis. -You are using a heart monitor to assess your heart rhythm. -Await results from the heart monitor and follow up with a cardiologist as needed.  ESSENTIAL HYPERTENSION: Your blood pressure is well-controlled after discharge. -Monitor your blood pressure regularly. -Antihypertensive medications are on hold until further notice.  Return if symptoms worsen or fail to improve.   Take care, Dr Kennyth  PLEASE NOTE:  If you had any lab tests, please let us  know if you have not heard back within a few days. You may see your results on mychart before we have a chance to review them but we will give you a call once they are reviewed by us .   If we ordered any referrals today,  please let us  know if you have not heard from their office within the next week.   If you had any urgent prescriptions sent in today, please check with the pharmacy within an hour of our visit to make sure the prescription was transmitted appropriately.   Please try these tips to maintain a healthy lifestyle:  Eat at least 3 REAL meals and 1-2 snacks per day.  Aim for no more than 5 hours between eating.  If you eat breakfast, please do so within one hour of getting up.   Each meal should contain half fruits/vegetables, one quarter protein, and one quarter carbs (no bigger than a computer mouse)  Cut down on sweet beverages. This includes juice, soda, and sweet tea.   Drink at least 1 glass of water  with each meal and aim for at least 8 glasses per day  Exercise at least 150 minutes every week.

## 2024-03-26 ENCOUNTER — Telehealth: Payer: Self-pay | Admitting: *Deleted

## 2024-03-26 ENCOUNTER — Ambulatory Visit: Payer: Self-pay | Admitting: Family Medicine

## 2024-03-26 NOTE — Progress Notes (Signed)
 Good news! Blood work is all improving.  His anemia and kidney function are improving.  We can recheck again in a few months.  Do not need to make any other changes to his treatment plan at this time.

## 2024-03-26 NOTE — Telephone Encounter (Signed)
 Copied from CRM #[id number]. Topic: Clinical - Home Health Verbal Orders >> Mar 22, 2024  3:02 PM Robinson DEL wrote: Caller/Agency: [facility] Callback Number: [phone] Service Requested: Physical Therapy Frequency: Move initial visit date to following week of 1/11-1/17 will be seeing patient for physical therapy Any new concerns about the patient? No   Ok to give VO? Staphanie Harbison,RMA

## 2024-03-27 NOTE — Telephone Encounter (Signed)
 Ok with me. Please place any necessary orders.

## 2024-03-27 NOTE — Telephone Encounter (Signed)
 [facility] Called  [phone] VO given

## 2024-03-28 ENCOUNTER — Telehealth: Payer: Self-pay | Admitting: *Deleted

## 2024-03-28 NOTE — Telephone Encounter (Signed)
 Copied from CRM [id number]. Topic: Clinical - Home Health Verbal Orders >> Mar 28, 2024  3:46 PM Deleta RAMAN wrote: Caller/Agency: [facility] Callback Number: [phone] Service Requested: Physical Therapy Frequency: 1 week 3  Any new concerns about the patient? No   Ok to give VO? Tirza Senteno,RMA

## 2024-03-29 NOTE — Telephone Encounter (Signed)
"   Called [facility] At #  [phone] VO given  "

## 2024-03-29 NOTE — Telephone Encounter (Signed)
 Ok with me. Please place any necessary orders.

## 2024-04-04 ENCOUNTER — Other Ambulatory Visit: Payer: Self-pay | Admitting: Nephrology

## 2024-04-04 DIAGNOSIS — N2889 Other specified disorders of kidney and ureter: Secondary | ICD-10-CM

## 2024-04-17 ENCOUNTER — Inpatient Hospital Stay
Admission: RE | Admit: 2024-04-17 | Discharge: 2024-04-17 | Disposition: A | Source: Ambulatory Visit | Attending: Student

## 2024-04-17 ENCOUNTER — Ambulatory Visit
Admission: RE | Admit: 2024-04-17 | Discharge: 2024-04-17 | Disposition: A | Source: Ambulatory Visit | Attending: Student

## 2024-04-17 ENCOUNTER — Other Ambulatory Visit: Payer: Self-pay

## 2024-04-17 DIAGNOSIS — K810 Acute cholecystitis: Secondary | ICD-10-CM

## 2024-04-17 MED ORDER — IOPAMIDOL (ISOVUE-300) INJECTION 61%
30.0000 mL | Freq: Once | INTRAVENOUS | Status: AC | PRN
  Administered 2024-04-17: 7 mL

## 2024-04-17 NOTE — Progress Notes (Signed)
" ° ° °  Chief Complaint: Patient was seen today for follow up cholecystostomy drain.  Referring Physician(s): Covington,Jamie R    Patient Status: Outpatient--[facility] [place]  Subjective: Cholecystostomy drain, 10Fr, placed 03/18/2024 in hospital secondary to acute cholecystitis.  03/08/2025 CT demonstrated a hepatic subcapsular hematoma and labs demonstrated anemia.  On 03/10/2025 a chole tube exchange was performed and brisk venous bleeding occurred.  An angiogram failed to demonstrate any active bleeding and the drain was up-sized to a 12Fr.  The patient's Hgb has improved, renal function has improved.  Objective: Physical Exam: There were no vitals taken for this visit. Sinogram performed.  Current Medications[1]  Labs: CBC No results for input(s): WBC, HGB, HCT, PLT in the last 72 hours. BMET No results for input(s): NA, K, CL, CO2, GLUCOSE, BUN, CREATININE, CALCIUM  in the last 72 hours. LFT No results for input(s): PROT, ALBUMIN, AST, ALT, ALKPHOS, BILITOT, BILIDIR, IBILI, LIPASE in the last 72 hours. PT/INR No results for input(s): LABPROT, INR in the last 72 hours.   Studies/Results: No results found.  Assessment/Plan: Follow up with general surgery appointments.  Continue routine care of the chole tube.  Schedule routine chole tube change in two months at hospital.    LOS: 0 days   The time I spent with the patient was to perform the chole tube injection with fluoro.  Cordella DELENA Banner PA-C 04/17/2024 2:05 PM     [1]  Current Outpatient Medications:    acetaminophen  (TYLENOL ) 325 MG tablet, Take 2 tablets (650 mg total) by mouth every 6 (six) hours as needed for mild pain (pain score 1-3) or fever (or Fever >/= 101). (Patient not taking: Reported on 03/25/2024), Disp: , Rfl:    atorvastatin  (LIPITOR) 40 MG tablet, TAKE 1 TABLET BY MOUTH EVERY DAY (Patient not taking: Reported on 03/25/2024), Disp: 90 tablet, Rfl: 3    lidocaine  (LIDODERM ) 5 %, Place 1 patch onto the skin daily. Remove & Discard patch within 12 hours or as directed by MD (Patient not taking: Reported on 03/25/2024), Disp: 30 patch, Rfl: 0   Sodium Chloride  Flush (NORMAL SALINE FLUSH) 0.9 % SOLN, Use 5 mLs daily as directed. (Patient not taking: Reported on 03/25/2024), Disp: 600 mL, Rfl: 0  "

## 2024-04-23 ENCOUNTER — Ambulatory Visit (HOSPITAL_BASED_OUTPATIENT_CLINIC_OR_DEPARTMENT_OTHER): Admitting: Family

## 2024-05-06 ENCOUNTER — Other Ambulatory Visit

## 2024-06-20 ENCOUNTER — Ambulatory Visit (INDEPENDENT_AMBULATORY_CARE_PROVIDER_SITE_OTHER): Admitting: Otolaryngology

## 2024-11-12 ENCOUNTER — Ambulatory Visit: Admitting: Family Medicine
